# Patient Record
Sex: Male | Born: 1935
Health system: Southern US, Community
[De-identification: ages and names within clinical notes are randomized; demographics above are authoritative.]

## PROBLEM LIST (undated history)

## (undated) DIAGNOSIS — K219 Gastro-esophageal reflux disease without esophagitis: Secondary | ICD-10-CM

## (undated) DIAGNOSIS — C801 Malignant (primary) neoplasm, unspecified: Secondary | ICD-10-CM

## (undated) DIAGNOSIS — G9389 Other specified disorders of brain: Secondary | ICD-10-CM

## (undated) DIAGNOSIS — Z952 Presence of prosthetic heart valve: Secondary | ICD-10-CM

## (undated) DIAGNOSIS — I350 Nonrheumatic aortic (valve) stenosis: Secondary | ICD-10-CM

## (undated) DIAGNOSIS — G20 Parkinson's disease: Secondary | ICD-10-CM

## (undated) DIAGNOSIS — I2510 Atherosclerotic heart disease of native coronary artery without angina pectoris: Secondary | ICD-10-CM

## (undated) DIAGNOSIS — G20A1 Parkinson's disease without dyskinesia, without mention of fluctuations: Secondary | ICD-10-CM

## (undated) DIAGNOSIS — I10 Essential (primary) hypertension: Secondary | ICD-10-CM

## (undated) DIAGNOSIS — G4733 Obstructive sleep apnea (adult) (pediatric): Secondary | ICD-10-CM

## (undated) DIAGNOSIS — M48061 Spinal stenosis, lumbar region without neurogenic claudication: Secondary | ICD-10-CM

## (undated) DIAGNOSIS — K8689 Other specified diseases of pancreas: Secondary | ICD-10-CM

## (undated) DIAGNOSIS — M1990 Unspecified osteoarthritis, unspecified site: Secondary | ICD-10-CM

## (undated) DIAGNOSIS — Z87891 Personal history of nicotine dependence: Secondary | ICD-10-CM

## (undated) DIAGNOSIS — E7800 Pure hypercholesterolemia, unspecified: Secondary | ICD-10-CM

## (undated) HISTORY — PX: TONSILLECTOMY: SUR1361

## (undated) HISTORY — DX: Parkinson's disease without dyskinesia, without mention of fluctuations: G20.A1

## (undated) HISTORY — PX: APPENDECTOMY: SHX54

## (undated) HISTORY — DX: Malignant (primary) neoplasm, unspecified: C80.1

## (undated) HISTORY — DX: Parkinson's disease: G20

## (undated) HISTORY — DX: Pure hypercholesterolemia, unspecified: E78.00

## (undated) HISTORY — PX: EYE SURGERY: SHX253

## (undated) HISTORY — PX: MENISCUS REPAIR: SHX5179

## (undated) HISTORY — DX: Essential (primary) hypertension: I10

## (undated) HISTORY — DX: Personal history of nicotine dependence: Z87.891

## (undated) HISTORY — DX: Spinal stenosis, lumbar region without neurogenic claudication: M48.061

---

## 2005-11-27 ENCOUNTER — Ambulatory Visit: Payer: Self-pay | Admitting: Internal Medicine

## 2005-12-19 ENCOUNTER — Encounter: Payer: Self-pay | Admitting: Internal Medicine

## 2005-12-19 ENCOUNTER — Encounter (HOSPITAL_COMMUNITY): Admission: RE | Admit: 2005-12-19 | Discharge: 2006-01-15 | Payer: Self-pay | Admitting: Internal Medicine

## 2005-12-19 ENCOUNTER — Ambulatory Visit: Payer: Self-pay | Admitting: Cardiology

## 2008-07-03 DIAGNOSIS — E785 Hyperlipidemia, unspecified: Secondary | ICD-10-CM

## 2008-07-03 DIAGNOSIS — I10 Essential (primary) hypertension: Secondary | ICD-10-CM

## 2008-11-03 ENCOUNTER — Encounter: Payer: Self-pay | Admitting: Internal Medicine

## 2008-11-17 ENCOUNTER — Encounter: Payer: Self-pay | Admitting: Internal Medicine

## 2008-11-17 ENCOUNTER — Ambulatory Visit: Payer: Self-pay | Admitting: Internal Medicine

## 2008-11-17 ENCOUNTER — Ambulatory Visit (HOSPITAL_COMMUNITY): Admission: RE | Admit: 2008-11-17 | Discharge: 2008-11-17 | Payer: Self-pay | Admitting: Internal Medicine

## 2008-11-17 HISTORY — PX: COLONOSCOPY: SHX174

## 2008-11-19 ENCOUNTER — Inpatient Hospital Stay (HOSPITAL_COMMUNITY): Admission: AD | Admit: 2008-11-19 | Discharge: 2008-11-20 | Payer: Self-pay

## 2008-11-19 ENCOUNTER — Telehealth (INDEPENDENT_AMBULATORY_CARE_PROVIDER_SITE_OTHER): Payer: Self-pay

## 2008-11-19 ENCOUNTER — Ambulatory Visit: Payer: Self-pay | Admitting: Internal Medicine

## 2008-11-19 ENCOUNTER — Telehealth: Payer: Self-pay | Admitting: Internal Medicine

## 2008-11-19 ENCOUNTER — Ambulatory Visit: Payer: Self-pay | Admitting: Gastroenterology

## 2008-11-19 ENCOUNTER — Encounter: Payer: Self-pay | Admitting: Urgent Care

## 2008-11-19 DIAGNOSIS — K219 Gastro-esophageal reflux disease without esophagitis: Secondary | ICD-10-CM | POA: Insufficient documentation

## 2008-11-19 DIAGNOSIS — K922 Gastrointestinal hemorrhage, unspecified: Secondary | ICD-10-CM | POA: Insufficient documentation

## 2008-11-19 DIAGNOSIS — K921 Melena: Secondary | ICD-10-CM | POA: Insufficient documentation

## 2008-11-19 DIAGNOSIS — D126 Benign neoplasm of colon, unspecified: Secondary | ICD-10-CM | POA: Insufficient documentation

## 2008-11-19 HISTORY — PX: COLONOSCOPY: SHX174

## 2008-11-19 LAB — CONVERTED CEMR LAB
Basophils Absolute: 0 10*3/uL (ref 0.0–0.1)
Lymphocytes Relative: 22 % (ref 12–46)
MCHC: 35.8 g/dL (ref 30.0–36.0)
Neutro Abs: 7.2 10*3/uL (ref 1.7–7.7)
Neutrophils Relative %: 70 % (ref 43–77)
Platelets: 199 10*3/uL (ref 150–400)

## 2008-11-20 ENCOUNTER — Ambulatory Visit: Payer: Self-pay | Admitting: Internal Medicine

## 2008-11-25 ENCOUNTER — Ambulatory Visit: Payer: Self-pay | Admitting: Oncology

## 2008-11-25 ENCOUNTER — Observation Stay (HOSPITAL_COMMUNITY): Admission: RE | Admit: 2008-11-25 | Discharge: 2008-11-26 | Payer: Self-pay | Admitting: Internal Medicine

## 2008-11-25 ENCOUNTER — Ambulatory Visit: Payer: Self-pay | Admitting: Internal Medicine

## 2008-11-25 HISTORY — PX: COLONOSCOPY: SHX174

## 2008-12-04 ENCOUNTER — Ambulatory Visit (HOSPITAL_COMMUNITY): Admission: RE | Admit: 2008-12-04 | Discharge: 2008-12-04 | Payer: Self-pay | Admitting: Internal Medicine

## 2008-12-07 ENCOUNTER — Encounter: Payer: Self-pay | Admitting: Internal Medicine

## 2009-11-12 ENCOUNTER — Telehealth (INDEPENDENT_AMBULATORY_CARE_PROVIDER_SITE_OTHER): Payer: Self-pay | Admitting: *Deleted

## 2009-11-24 ENCOUNTER — Encounter: Payer: Self-pay | Admitting: Internal Medicine

## 2009-12-06 ENCOUNTER — Ambulatory Visit: Payer: Self-pay | Admitting: Internal Medicine

## 2009-12-06 ENCOUNTER — Ambulatory Visit (HOSPITAL_COMMUNITY): Admission: RE | Admit: 2009-12-06 | Discharge: 2009-12-06 | Payer: Self-pay | Admitting: Internal Medicine

## 2009-12-06 HISTORY — PX: COLONOSCOPY: SHX174

## 2009-12-08 ENCOUNTER — Encounter: Payer: Self-pay | Admitting: Internal Medicine

## 2010-02-15 NOTE — Letter (Addendum)
Summary: Patient Notice, Colon Biopsy Results  [facility]  [address]   [place], [place] [address]   Phone: [phone]  Fax: [phone]       December 08, 2009   DANA DORNER [address] [place], [place]  [address] 11-Nov-1931    Dear Mr. Orgeron,  I am pleased to inform you that the biopsies taken during your recent colonoscopy did not show any evidence of cancer upon pathologic examination.  Additional information/recommendations:  You should have a repeat colonoscopy examination  in 5 years if your overall health permits.      Please call us if you are having persistent problems or have questions about your condition that have not been fully answered at this time.  Sincerely,    R. Roetta Sessions MD, FACP [facility] Ph: [phone]    Fax: [phone]   Appended Document: Patient Notice, Colon Biopsy Results letter mailed to pt  Appended Document: Patient Notice, Colon Biopsy Results Letter mailed to pt. Copy of letter and path to PCP Dr. Dwana Melena. Copy of path report to pt's son who is GI doctor in [place] mailed per Dr. Jena Gauss request.  Appended Document: Patient Notice, Colon Biopsy Results reminder in computer

## 2010-02-15 NOTE — Progress Notes (Signed)
Summary: triage due for Nov 2011  Phone Note Call from Patient   Reason for Call: Talk to Nurse Summary of Call: pt's wife called to see when her husband's next tcs is due and that will be for Nov 2011. please call pt back at [phone] for triage Initial call taken by: Diana Eves,  November 12, 2009 10:57 AM     Appended Document: triage due for Nov 2011 Pt triaged and scheduled for 12/06/2009 @ 8:15 AM. Info faxed to [employer].

## 2010-02-15 NOTE — Letter (Signed)
Summary: TCS ORDER/TRIAGE  TCS ORDER/TRIAGE   Imported By: Rexene Alberts 11/24/2009 12:01:06  _____________________________________________________________________  External Attachment:    Type:   Image     Comment:   External Document

## 2010-04-20 LAB — CBC
HCT: 25.3 % — ABNORMAL LOW (ref 39.0–52.0)
HCT: 33.7 % — ABNORMAL LOW (ref 39.0–52.0)
HCT: 34.6 % — ABNORMAL LOW (ref 39.0–52.0)
Hemoglobin: 12 g/dL — ABNORMAL LOW (ref 13.0–17.0)
Hemoglobin: 12.4 g/dL — ABNORMAL LOW (ref 13.0–17.0)
Hemoglobin: 12.9 g/dL — ABNORMAL LOW (ref 13.0–17.0)
MCHC: 35.4 g/dL (ref 30.0–36.0)
MCHC: 35.8 g/dL (ref 30.0–36.0)
MCV: 96.6 fL (ref 78.0–100.0)
MCV: 97.2 fL (ref 78.0–100.0)
Platelets: 146 10*3/uL — ABNORMAL LOW (ref 150–400)
Platelets: 173 10*3/uL (ref 150–400)
Platelets: 232 10*3/uL (ref 150–400)
RBC: 3 MIL/uL — ABNORMAL LOW (ref 4.22–5.81)
RBC: 3.57 MIL/uL — ABNORMAL LOW (ref 4.22–5.81)
RBC: 3.72 MIL/uL — ABNORMAL LOW (ref 4.22–5.81)
RDW: 13.5 % (ref 11.5–15.5)
RDW: 14.2 % (ref 11.5–15.5)
WBC: 13.4 10*3/uL — ABNORMAL HIGH (ref 4.0–10.5)
WBC: 8 10*3/uL (ref 4.0–10.5)

## 2010-04-20 LAB — BASIC METABOLIC PANEL
BUN: 12 mg/dL (ref 6–23)
Calcium: 8.5 mg/dL (ref 8.4–10.5)
Creatinine, Ser: 1.13 mg/dL (ref 0.4–1.5)
GFR calc non Af Amer: >60 mL/min (ref >60)
Glucose, Bld: 108 mg/dL — ABNORMAL HIGH (ref 70–99)

## 2010-04-20 LAB — CROSSMATCH
Antibody Screen: NEGATIVE
Antibody Screen: NEGATIVE

## 2010-04-20 LAB — PROTIME-INR
INR: 1.04 (ref 0.00–1.49)
INR: 1.06 (ref 0.00–1.49)
Prothrombin Time: 13.5 seconds (ref 11.6–15.2)
Prothrombin Time: 13.7 seconds (ref 11.6–15.2)

## 2010-04-20 LAB — DIFFERENTIAL
Basophils Absolute: 0 10*3/uL (ref 0.0–0.1)
Basophils Absolute: 0 10*3/uL (ref 0.0–0.1)
Basophils Absolute: 0 10*3/uL (ref 0.0–0.1)
Basophils Absolute: 0 10*3/uL (ref 0.0–0.1)
Basophils Absolute: 0 10*3/uL (ref 0.0–0.1)
Basophils Relative: 0 % (ref 0–1)
Basophils Relative: 0 % (ref 0–1)
Eosinophils Relative: 1 % (ref 0–5)
Eosinophils Relative: 1 % (ref 0–5)
Eosinophils Relative: 1 % (ref 0–5)
Eosinophils Relative: 2 % (ref 0–5)
Eosinophils Relative: 2 % (ref 0–5)
Eosinophils Relative: 2 % (ref 0–5)
Lymphocytes Relative: 21 % (ref 12–46)
Lymphocytes Relative: 24 % (ref 12–46)
Lymphocytes Relative: 26 % (ref 12–46)
Lymphs Abs: 1.7 10*3/uL (ref 0.7–4.0)
Lymphs Abs: 1.8 10*3/uL (ref 0.7–4.0)
Monocytes Absolute: 0.4 10*3/uL (ref 0.1–1.0)
Monocytes Absolute: 0.6 10*3/uL (ref 0.1–1.0)
Monocytes Relative: 6 % (ref 3–12)
Monocytes Relative: 7 % (ref 3–12)
Neutro Abs: 5.3 10*3/uL (ref 1.7–7.7)
Neutro Abs: 5.6 10*3/uL (ref 1.7–7.7)
Neutrophils Relative %: 63 % (ref 43–77)
Neutrophils Relative %: 65 % (ref 43–77)
Neutrophils Relative %: 70 % (ref 43–77)
Neutrophils Relative %: 70 % (ref 43–77)

## 2010-04-20 LAB — HEMOGLOBIN AND HEMATOCRIT, BLOOD
HCT: 29.2 % — ABNORMAL LOW (ref 39.0–52.0)
Hemoglobin: 10.4 g/dL — ABNORMAL LOW (ref 13.0–17.0)
Hemoglobin: 9.5 g/dL — ABNORMAL LOW (ref 13.0–17.0)

## 2010-04-20 LAB — BLEEDING TIME: Bleeding Time: 8.5 minutes (ref 2.5–9.5)

## 2010-04-20 LAB — COMPREHENSIVE METABOLIC PANEL
AST: 22 U/L (ref 0–37)
BUN: 28 mg/dL — ABNORMAL HIGH (ref 6–23)
CO2: 29 mEq/L (ref 19–32)
Chloride: 101 mEq/L (ref 96–112)
Creatinine, Ser: 1.52 mg/dL — ABNORMAL HIGH (ref 0.4–1.5)
GFR calc non Af Amer: 45 mL/min — ABNORMAL LOW (ref >60)
Potassium: 4.1 mEq/L (ref 3.5–5.1)
Total Bilirubin: 0.7 mg/dL (ref 0.3–1.2)

## 2010-04-20 LAB — APTT: aPTT: 33 seconds (ref 24–37)

## 2010-04-20 LAB — ABO/RH: ABO/RH(D): O POS

## 2010-06-03 NOTE — Assessment & Plan Note (Signed)
[facility]                            CARDIOLOGY OFFICE NOTE   [name], [name]                      MRN:          [id number]  DATE:11/27/2005                            DOB:          11-Oct-1931    IDENTIFICATION:  [name] is a 77 year old gentleman who is referred  by Dr. Catalina Pizza for possible stress test.   HISTORY OF PRESENT ILLNESS:  The patient has no known history of  coronary artery disease.  He walks a mile in about 15 minutes.  This has  been constant.  He also works on a Engineer, agricultural farm.   His brother was recently seen in [place], [place] for cataract surgery.  Preoperative evaluation showed a positive stress test.  He went on to  have PTCA stent to a vessel, not clear what.  Another brother who had  died, question if he had cardiac issues.   Currently, again, the patient denies chest pain.   ALLERGIES:  None.   MEDICATIONS:  1. Simvastatin 40 q. day.  2. Nexium 40 q. day.  3. Micardis hydrochlorothiazide 80/25 q. day.  4. DynaCirc 10 q. day.  5. Glucosamine q. day.  6. Multivitamin q. day.  7. Calcium q. day.  8. Vitamin C q. day.   PAST MEDICAL HISTORY:  1. Hypertension.  2. Dyslipidemia.   SOCIAL HISTORY:  The patient quit tobacco 20 years ago after about a 30  pack-year.  Does not drink.   FAMILY HISTORY:  As noted above.   REVIEW OF SYSTEMS:  All systems reviewed and negative, except for the  above problems.  Note, the patient recently moved here from [place],  [place].  Was followed by Dr. Jonelle Sports.   PHYSICAL EXAM:  The patient is in no distress.  Blood pressure 140/74, pulse is 68 and regular, weight 200 pounds.  NECK:  No bruits.  LUNGS:  Clear.  CARDIAC:  Regular rate and rhythm.  S1, S2.  No S3.  No murmurs.  ABDOMEN:  Benign.  EXTREMITIES:  No edema.  Good distal pulses.   A 12-lead EKG, normal sinus rhythm 68 beats per minute.  Occasional PVC.   IMPRESSION:  [name] is a 77 year old with a  significant family  history for coronary artery disease.  His brother was asymptomatic.  He  is active some, though he walks at a relatively slower pace with a 15  minute mile.   He has risk factors for CAD.  I think it is not unreasonable to go ahead  and do a stress test to rule out any significant ischemia with his  symptoms.  I would only, if he has a mild defect, I would recommend only  medical therapy.  He had been told by his relative not to take aspirin  (a gastroenterologist) because of a history of ulcer disease.  Again,  enteric-coated may be needed depending on the results of the Myoview and  there may be benefit, but I will not push this for now.   Would continue on the current regimen.  Again, he is followed by Dr.  Margo Aye.  He  had lipids checked in [place], but will be due to have these  in the next few months.  I will leave this up to his primary.   I have not set a definite followup, but will be in touch with the  patient regarding test results.     Pricilla Riffle, MD, [facility]  Electronically Signed    PVR/MedQ  DD: 11/27/2005  DT: 11/27/2005  Job #: [id number]   cc:   Catalina Pizza, M.D.

## 2011-02-15 DIAGNOSIS — E785 Hyperlipidemia, unspecified: Secondary | ICD-10-CM | POA: Diagnosis not present

## 2011-02-15 DIAGNOSIS — I10 Essential (primary) hypertension: Secondary | ICD-10-CM | POA: Diagnosis not present

## 2011-03-20 ENCOUNTER — Other Ambulatory Visit (HOSPITAL_COMMUNITY): Payer: Self-pay | Admitting: Internal Medicine

## 2011-03-20 ENCOUNTER — Ambulatory Visit (HOSPITAL_COMMUNITY)
Admission: RE | Admit: 2011-03-20 | Discharge: 2011-03-20 | Disposition: A | Discharge status: Home / Self Care | Payer: Medicare Other | Source: Ambulatory Visit | Attending: Internal Medicine | Admitting: Internal Medicine

## 2011-03-20 DIAGNOSIS — R0989 Other specified symptoms and signs involving the circulatory and respiratory systems: Secondary | ICD-10-CM | POA: Insufficient documentation

## 2011-03-20 DIAGNOSIS — R0609 Other forms of dyspnea: Secondary | ICD-10-CM | POA: Insufficient documentation

## 2011-03-20 DIAGNOSIS — R0600 Dyspnea, unspecified: Secondary | ICD-10-CM

## 2011-03-20 DIAGNOSIS — R0602 Shortness of breath: Secondary | ICD-10-CM

## 2011-03-20 DIAGNOSIS — I10 Essential (primary) hypertension: Secondary | ICD-10-CM | POA: Diagnosis not present

## 2011-03-20 DIAGNOSIS — Z87891 Personal history of nicotine dependence: Secondary | ICD-10-CM | POA: Insufficient documentation

## 2011-03-20 DIAGNOSIS — R918 Other nonspecific abnormal finding of lung field: Secondary | ICD-10-CM | POA: Insufficient documentation

## 2011-03-22 ENCOUNTER — Other Ambulatory Visit (HOSPITAL_COMMUNITY): Payer: Self-pay | Admitting: Internal Medicine

## 2011-03-22 DIAGNOSIS — R9389 Abnormal findings on diagnostic imaging of other specified body structures: Secondary | ICD-10-CM

## 2011-03-27 ENCOUNTER — Ambulatory Visit (HOSPITAL_COMMUNITY)
Admission: RE | Admit: 2011-03-27 | Discharge: 2011-03-27 | Disposition: A | Discharge status: Home / Self Care | Payer: Medicare Other | Source: Ambulatory Visit | Attending: Internal Medicine | Admitting: Internal Medicine

## 2011-03-27 DIAGNOSIS — J438 Other emphysema: Secondary | ICD-10-CM | POA: Diagnosis not present

## 2011-03-27 DIAGNOSIS — R918 Other nonspecific abnormal finding of lung field: Secondary | ICD-10-CM | POA: Insufficient documentation

## 2011-03-27 DIAGNOSIS — R0602 Shortness of breath: Secondary | ICD-10-CM | POA: Diagnosis not present

## 2011-03-27 DIAGNOSIS — F17200 Nicotine dependence, unspecified, uncomplicated: Secondary | ICD-10-CM | POA: Diagnosis not present

## 2011-03-27 DIAGNOSIS — R9389 Abnormal findings on diagnostic imaging of other specified body structures: Secondary | ICD-10-CM

## 2011-03-27 DIAGNOSIS — J984 Other disorders of lung: Secondary | ICD-10-CM | POA: Diagnosis not present

## 2011-03-29 DIAGNOSIS — H40059 Ocular hypertension, unspecified eye: Secondary | ICD-10-CM | POA: Diagnosis not present

## 2011-03-29 DIAGNOSIS — H278 Other specified disorders of lens: Secondary | ICD-10-CM | POA: Diagnosis not present

## 2011-03-29 DIAGNOSIS — H2510 Age-related nuclear cataract, unspecified eye: Secondary | ICD-10-CM | POA: Diagnosis not present

## 2011-05-29 DIAGNOSIS — L821 Other seborrheic keratosis: Secondary | ICD-10-CM | POA: Diagnosis not present

## 2011-05-29 DIAGNOSIS — D239 Other benign neoplasm of skin, unspecified: Secondary | ICD-10-CM | POA: Diagnosis not present

## 2011-05-29 DIAGNOSIS — L570 Actinic keratosis: Secondary | ICD-10-CM | POA: Diagnosis not present

## 2011-05-29 DIAGNOSIS — Z85828 Personal history of other malignant neoplasm of skin: Secondary | ICD-10-CM | POA: Diagnosis not present

## 2011-06-16 DIAGNOSIS — R7301 Impaired fasting glucose: Secondary | ICD-10-CM | POA: Diagnosis not present

## 2011-06-16 DIAGNOSIS — I10 Essential (primary) hypertension: Secondary | ICD-10-CM | POA: Diagnosis not present

## 2011-06-16 DIAGNOSIS — G20 Parkinson's disease: Secondary | ICD-10-CM | POA: Diagnosis not present

## 2011-06-16 DIAGNOSIS — E785 Hyperlipidemia, unspecified: Secondary | ICD-10-CM | POA: Diagnosis not present

## 2011-06-19 DIAGNOSIS — G20 Parkinson's disease: Secondary | ICD-10-CM | POA: Diagnosis not present

## 2011-06-19 DIAGNOSIS — G609 Hereditary and idiopathic neuropathy, unspecified: Secondary | ICD-10-CM | POA: Diagnosis not present

## 2011-10-19 DIAGNOSIS — G20 Parkinson's disease: Secondary | ICD-10-CM | POA: Diagnosis not present

## 2011-11-30 DIAGNOSIS — H2510 Age-related nuclear cataract, unspecified eye: Secondary | ICD-10-CM | POA: Diagnosis not present

## 2011-11-30 DIAGNOSIS — H278 Other specified disorders of lens: Secondary | ICD-10-CM | POA: Diagnosis not present

## 2011-12-05 ENCOUNTER — Other Ambulatory Visit: Payer: Self-pay | Admitting: Dermatology

## 2011-12-05 DIAGNOSIS — IMO0002 Reserved for concepts with insufficient information to code with codable children: Secondary | ICD-10-CM | POA: Diagnosis not present

## 2011-12-05 DIAGNOSIS — D239 Other benign neoplasm of skin, unspecified: Secondary | ICD-10-CM | POA: Diagnosis not present

## 2011-12-05 DIAGNOSIS — I8390 Asymptomatic varicose veins of unspecified lower extremity: Secondary | ICD-10-CM | POA: Diagnosis not present

## 2011-12-05 DIAGNOSIS — C44319 Basal cell carcinoma of skin of other parts of face: Secondary | ICD-10-CM | POA: Diagnosis not present

## 2011-12-05 DIAGNOSIS — L570 Actinic keratosis: Secondary | ICD-10-CM | POA: Diagnosis not present

## 2011-12-05 DIAGNOSIS — D485 Neoplasm of uncertain behavior of skin: Secondary | ICD-10-CM | POA: Diagnosis not present

## 2011-12-05 DIAGNOSIS — L219 Seborrheic dermatitis, unspecified: Secondary | ICD-10-CM | POA: Diagnosis not present

## 2011-12-05 DIAGNOSIS — L821 Other seborrheic keratosis: Secondary | ICD-10-CM | POA: Diagnosis not present

## 2011-12-12 DIAGNOSIS — I10 Essential (primary) hypertension: Secondary | ICD-10-CM | POA: Diagnosis not present

## 2011-12-12 DIAGNOSIS — H40059 Ocular hypertension, unspecified eye: Secondary | ICD-10-CM | POA: Diagnosis not present

## 2011-12-12 DIAGNOSIS — Z9849 Cataract extraction status, unspecified eye: Secondary | ICD-10-CM | POA: Diagnosis not present

## 2011-12-12 DIAGNOSIS — H2510 Age-related nuclear cataract, unspecified eye: Secondary | ICD-10-CM | POA: Diagnosis not present

## 2011-12-12 DIAGNOSIS — H278 Other specified disorders of lens: Secondary | ICD-10-CM | POA: Diagnosis not present

## 2011-12-12 DIAGNOSIS — H269 Unspecified cataract: Secondary | ICD-10-CM | POA: Diagnosis not present

## 2011-12-12 DIAGNOSIS — G20 Parkinson's disease: Secondary | ICD-10-CM | POA: Diagnosis not present

## 2011-12-12 DIAGNOSIS — K219 Gastro-esophageal reflux disease without esophagitis: Secondary | ICD-10-CM | POA: Diagnosis not present

## 2011-12-12 DIAGNOSIS — H2589 Other age-related cataract: Secondary | ICD-10-CM | POA: Diagnosis not present

## 2011-12-12 DIAGNOSIS — E7800 Pure hypercholesterolemia, unspecified: Secondary | ICD-10-CM | POA: Diagnosis not present

## 2011-12-12 DIAGNOSIS — Z961 Presence of intraocular lens: Secondary | ICD-10-CM | POA: Diagnosis not present

## 2011-12-20 DIAGNOSIS — I10 Essential (primary) hypertension: Secondary | ICD-10-CM | POA: Diagnosis not present

## 2011-12-20 DIAGNOSIS — R7301 Impaired fasting glucose: Secondary | ICD-10-CM | POA: Diagnosis not present

## 2011-12-20 DIAGNOSIS — E785 Hyperlipidemia, unspecified: Secondary | ICD-10-CM | POA: Diagnosis not present

## 2011-12-20 DIAGNOSIS — R7309 Other abnormal glucose: Secondary | ICD-10-CM | POA: Diagnosis not present

## 2011-12-20 DIAGNOSIS — G20 Parkinson's disease: Secondary | ICD-10-CM | POA: Diagnosis not present

## 2011-12-21 DIAGNOSIS — Z23 Encounter for immunization: Secondary | ICD-10-CM | POA: Diagnosis not present

## 2012-01-02 DIAGNOSIS — C44319 Basal cell carcinoma of skin of other parts of face: Secondary | ICD-10-CM | POA: Diagnosis not present

## 2012-01-18 DIAGNOSIS — G20 Parkinson's disease: Secondary | ICD-10-CM | POA: Diagnosis not present

## 2012-02-09 DIAGNOSIS — G4731 Primary central sleep apnea: Secondary | ICD-10-CM | POA: Diagnosis not present

## 2012-02-09 DIAGNOSIS — G4730 Sleep apnea, unspecified: Secondary | ICD-10-CM | POA: Diagnosis not present

## 2012-02-09 DIAGNOSIS — G4720 Circadian rhythm sleep disorder, unspecified type: Secondary | ICD-10-CM | POA: Diagnosis not present

## 2012-02-09 DIAGNOSIS — G479 Sleep disorder, unspecified: Secondary | ICD-10-CM | POA: Diagnosis not present

## 2012-02-09 DIAGNOSIS — G4733 Obstructive sleep apnea (adult) (pediatric): Secondary | ICD-10-CM | POA: Diagnosis not present

## 2012-02-09 DIAGNOSIS — G4710 Hypersomnia, unspecified: Secondary | ICD-10-CM | POA: Diagnosis not present

## 2012-02-12 DIAGNOSIS — J069 Acute upper respiratory infection, unspecified: Secondary | ICD-10-CM | POA: Diagnosis not present

## 2012-02-19 DIAGNOSIS — G4731 Primary central sleep apnea: Secondary | ICD-10-CM | POA: Diagnosis not present

## 2012-02-19 DIAGNOSIS — G4733 Obstructive sleep apnea (adult) (pediatric): Secondary | ICD-10-CM | POA: Diagnosis not present

## 2012-02-19 DIAGNOSIS — G20 Parkinson's disease: Secondary | ICD-10-CM | POA: Diagnosis not present

## 2012-02-19 DIAGNOSIS — R0902 Hypoxemia: Secondary | ICD-10-CM | POA: Diagnosis not present

## 2012-04-16 ENCOUNTER — Encounter: Payer: Self-pay | Admitting: Neurology

## 2012-04-16 DIAGNOSIS — G4731 Primary central sleep apnea: Secondary | ICD-10-CM

## 2012-04-16 DIAGNOSIS — G20 Parkinson's disease: Secondary | ICD-10-CM

## 2012-04-16 DIAGNOSIS — I951 Orthostatic hypotension: Secondary | ICD-10-CM

## 2012-04-16 DIAGNOSIS — G609 Hereditary and idiopathic neuropathy, unspecified: Secondary | ICD-10-CM

## 2012-04-16 DIAGNOSIS — G20A1 Parkinson's disease without dyskinesia, without mention of fluctuations: Secondary | ICD-10-CM | POA: Insufficient documentation

## 2012-04-16 DIAGNOSIS — G4733 Obstructive sleep apnea (adult) (pediatric): Secondary | ICD-10-CM | POA: Insufficient documentation

## 2012-04-16 DIAGNOSIS — G479 Sleep disorder, unspecified: Secondary | ICD-10-CM

## 2012-04-16 DIAGNOSIS — R0902 Hypoxemia: Secondary | ICD-10-CM | POA: Insufficient documentation

## 2012-04-17 ENCOUNTER — Encounter: Payer: Self-pay | Admitting: Neurology

## 2012-04-17 ENCOUNTER — Ambulatory Visit (INDEPENDENT_AMBULATORY_CARE_PROVIDER_SITE_OTHER): Payer: Medicare Other | Admitting: Neurology

## 2012-04-17 VITALS — BP 106/58 | HR 100 | Ht 69.0 in | Wt 200.0 lb

## 2012-04-17 DIAGNOSIS — G4733 Obstructive sleep apnea (adult) (pediatric): Secondary | ICD-10-CM | POA: Diagnosis not present

## 2012-04-17 DIAGNOSIS — G20 Parkinson's disease: Secondary | ICD-10-CM

## 2012-04-17 DIAGNOSIS — G4731 Primary central sleep apnea: Secondary | ICD-10-CM

## 2012-04-17 DIAGNOSIS — G479 Sleep disorder, unspecified: Secondary | ICD-10-CM | POA: Diagnosis not present

## 2012-04-17 DIAGNOSIS — G4730 Sleep apnea, unspecified: Secondary | ICD-10-CM

## 2012-04-17 DIAGNOSIS — I951 Orthostatic hypotension: Secondary | ICD-10-CM

## 2012-04-17 NOTE — Progress Notes (Signed)
Subjective:    Patient ID: [name] is a 79 y.o. male.  HPI  Interim history:  [name] is a 79 year old right-handed gentleman who presents for followup consultation. He is accompanied by his wife today. I first met him on 02/19/2012 and he is a former patient of Dr. Imagene Gurney and was diagnosed with PD some 3 years ago with Sx starting on L side. This has not been associated with memory loss, depression, hallucinations, falls, bowel or bladder incontinence, postural hypotension, and there is no Hx of exposure to pesticides, herbicides, dopamine receptor blocking drugs, or family history of tremor. He exercises by walking 1 mile every morning for about 15-20 minutes, either outside or on a treadmill, 7 days per week. He is independent in his ADLs, drives a car, cuts wood. He has daytime sleepiness with. ESS 11-13. since beginning pramipexole and is on 0.5 mg t.i.d. Workup for evaluation of his peripheral neuropathy has included serum protein electrophoresis, 24-hour urine for heavy metals, B12 level, sedimentation rate, TSH, RA latex turbid, ACE, and vitamin D, all of which have been normal. He denies compulsive behavior since beginning pramipexole. He has an unusual sleep pattern, going to bed at 6 or 7PM , getting up at 9 PM, going back to bed and awakening at 4 AM. His wife states he acts out his dreams at night.  When I first met him on 02/19/2012 I went over his sleep study results with him in detail. Essentially he had mixed sleep disorder breathing. He had a total AHI of 39.5/h based primarily on central apneas. His obstructive AHI was around 15/hour. He was noted to have significant or severe sleep fragmentation and the sleep efficiency was reduced at 57.5%. He had an increased arousal index at 70.4/hour, due primarily to respiratory events. He slept mostly in stage I and stage II sleep and had near absence of slow wave sleep at 2.4% and almost no REM sleep at 0.9%. No REM behavior disorder  was seen but then again he had almost no REM sleep. His baseline oxygen saturation was 90% with a nadir of 85% and he spent 3 hours and 31 minutes below the saturation of 90% for the night. He was noted to have significant bouts of coughing. He said during his visit that he had rescheduled one time for this test because he had a cold and he was still quite congested and had post nasal drip during the study. He had also indicated to the technologist that he would not be able to sleep on his back because of coughing. He felt improved in that regard when I met him. He had been taking Mucinex DM. I did explain to him that some of the medications that have a "D" after them are not suitable for Parkinson's patients.  At the time of his visit in February with me I suggested that we try him on treatment with positive airway pressure, such as CPAP or possibly BiPAP. Given the significant central component of his sleep disordered breathing he may actually do better with BiPAP. He indicated that he would like to hold off until his appointment in April because he was going to be out of town and he also wanted to discuss this with his wife and bring her back for discussion for his appointment in April and we had left it at that. His wife reports, that he falls asleep at home at inadvertent times when sedentary. No recent dream enactments are reported by wife. He  goes to the bathroom at night one to three times per night. No recent dream enactments are reported. He is apprehensive about coming back for another sleep study.   His Past Medical History Is Significant For: Past Medical History  Diagnosis Date  . Hypercholesteremia   . Cancer   . Parkinson's disease   . Hypertension   . Sleep disorder     His Past Surgical History Is Significant For: Past Surgical History  Procedure Laterality Date  . Tonsillectomy Bilateral     His Family History Is Significant For: Family History  Problem Relation Age of Onset   . Dementia Mother   . Cancer Brother   . Stroke Brother     His Social History Is Significant For: History   Social History  . Marital Status: Married    Spouse Name: N/A    Number of Children: N/A  . Years of Education: N/A   Social History Main Topics  . Smoking status: None  . Smokeless tobacco: None  . Alcohol Use: None  . Drug Use: None  . Sexually Active: None   Other Topics Concern  . None   Social History Narrative  . None    His Allergies Are:  No Known Allergies:   His Current Medications Are:  Outpatient Encounter Prescriptions as of 04/17/2012  Medication Sig Dispense Refill  . amLODipine (NORVASC) 10 MG tablet Take 10 mg by mouth daily.      . calcium gluconate 500 MG tablet Take 500 mg by mouth daily.      . carbidopa-levodopa (SINEMET IR) 25-100 MG per tablet Take 1 tablet by mouth 3 (three) times daily. 0.5 for last dose at night      . glucosamine-chondroitin 500-400 MG tablet Take 1 tablet by mouth once.      Marland Kitchen ketoconazole (NIZORAL) 2 % cream       . Multiple Vitamin (MULTIVITAMIN) tablet Take 1 tablet by mouth daily.      Marland Kitchen omeprazole (PRILOSEC OTC) 20 MG tablet Take 20 mg by mouth daily.      . pramipexole (MIRAPEX) 0.5 MG tablet Take 0.5 mg by mouth 3 (three) times daily.      Marland Kitchen telmisartan-hydrochlorothiazide (MICARDIS HCT) 80-25 MG per tablet Take 1 tablet by mouth daily.      . vitamin C (ASCORBIC ACID) 500 MG tablet Take 500 mg by mouth daily.       No facility-administered encounter medications on file as of 04/17/2012.  : Review of Systems  Neurological: Positive for tremors.    Objective:  Neurologic Exam  Physical Exam Physical Examination:   Filed Vitals:   04/17/12 0931  BP: 106/58  Pulse: 100    General Examination: The patient is a very pleasant 79 y.o. male in no acute distress. HEENT exam: He has a moderate degree of nuchal rigidity with intermittent lower jaw and lip tremor. He has a mild to moderately masked facies  with decreased eye blink rate. Hearing is mildly impaired. Speech is mildly hypophonic, but not dysarthric and minimal drooling is noted. Pupils are equal, round and reactive to light. On extraocular tracking he has mild saccadic breakdown especially on downward gaze. He has no carotid bruits. Chest is clear to auscultation without wheezing or rhonchi noted. Heart sounds are normal without murmurs, rubs or gallops noted. Abdomen is soft, nontender with normal bowel sounds appreciated. He has no pitting edema in the distal lower extremities bilaterally. No joint deformities are noted. Skin is warm  and dry. Neurologically: Mental status: The patient is awake, alert and oriented in all 3 spheres. His memory, attention, language and knowledge are appropriate. Cranial nerves are as described under HEENT exam. In addition airway exam reveals a moderately tight airway secondary to a narrow airway entry. Tongue protrudes centrally and palate elevates symmetrically. Motor exam: Normal bulk and strength is noted. Tone is increased with some cogwheeling noted in the left upper extremity. Tone is also increased in the left lower extremity. Tone is minimally increased on the right side. He has a moderate intermittent resting tremor in the left upper extremity. He has an intermittent subtle resting tremor on the right  upper extremity. Fine motor skills with finger taps and hand movements as well as rapid alternating patting is moderately impaired on the left and mild to moderately so on the right. Foot taps are mild to moderately impaired on the right and moderately so on the left. He stands up from the seated position without problems in his posture is mild to moderately stooped. He walks with decreased arm swing bilaterally. He has a fairly good stride length but decrease pace. He turns in 3 steps. His balance is fairly well preserved. Romberg is negative. Reflexes are 1+ throughout with the absence of other the right  knee jerk. Sensory exam is intact to light touch, pinprick, vibration and temperature sense. Cerebellar testing shows no dysmetria or intention tremor on finger to nose testing. He has no truncal or gait ataxia. He is able to pull himself up on his toes and heels.   Assessment and Plan:   Assessment and Plan:  In summary, [name] is a very pleasant 79 y.o.-year old male with a history of  left-sided predominant Parkinson's disease. He is doing  fairly well at this time.  He also has evidence of mixed sleep apnea with a significant central component. He had severe sleep fragmentation and poor sleep quality.  I had a long chat with the patient and his wife about my findings and the diagnosis, its prognosis and treatment options. We talked about medical treatments and non-pharmacological approaches. We talked about maintaining a healthy lifestyle in general. I encouraged the patient to eat healthy, exercise daily and keep well hydrated, to keep a scheduled bedtime and wake time routine, to not skip any meals and eat healthy snacks in between meals and to have protein with every meal.   I recommended the following at this time:  I would like for him to come back for a full night CPAP titration study. He may benefit from BiPAP treatment given his central component. I talked to him and his wife at length about why we need to do a proper in-house titration. He is quite reluctant and apprehensive about coming back for another test. I tried to reassure him and answered all her questions today and the patient and his wife were agreeable to having him come back for another sleep study for titration. I will see him back afterwards. I made no changes to his Parkinson's medications today and he did not need any refills. I encouraged them to call with any interim questions, concerns, problems or updates and refill requests.

## 2012-04-17 NOTE — Patient Instructions (Addendum)
I think overall you are doing fairly well and are stable at this point with regards to your parkinson's. We should try to treat your sleep apnea with a CPAP or BiPAP machine. You will need another sleep study for this and I will arrange for one.   I do have some generic suggestions for you today:  Please make sure that you drink plenty of fluids. I would like for you to exercise daily for example in the form of walking 20-30 minutes every day, if you can. Please keep a regular sleep-wake schedule, keep regular meal times, do not skip any meals, eat  healthy snacks in between meals, such as fruit or nuts. Try to eat protein with every meal.   Engage in social activities in your community and with your family and try to keep up with current events by reading the newspaper or watching the news.  I do not think we need to make any changes in your medications at this point. I will see you about a month after your sleep study. Please call us if you have any interim questions, concerns, or problems or updates to need to discuss.  Please also call us for any test results so we can go over those with you on the phone. Brett Canales is my clinical assistant and will answer any of your questions and relay your messages to me and will give you my messages.   Our phone number is [phone]. We also have an after hours call service for urgent matters and there is a physician on-call for urgent questions. For any emergencies you know to call 911 or go to the nearest emergency room.

## 2012-05-14 ENCOUNTER — Ambulatory Visit (INDEPENDENT_AMBULATORY_CARE_PROVIDER_SITE_OTHER): Payer: Medicare Other

## 2012-05-14 DIAGNOSIS — I10 Essential (primary) hypertension: Secondary | ICD-10-CM

## 2012-05-14 DIAGNOSIS — G4733 Obstructive sleep apnea (adult) (pediatric): Secondary | ICD-10-CM

## 2012-05-14 DIAGNOSIS — G479 Sleep disorder, unspecified: Secondary | ICD-10-CM

## 2012-05-14 DIAGNOSIS — G4731 Primary central sleep apnea: Secondary | ICD-10-CM

## 2012-05-24 ENCOUNTER — Other Ambulatory Visit: Payer: Self-pay | Admitting: Neurology

## 2012-05-24 DIAGNOSIS — G4733 Obstructive sleep apnea (adult) (pediatric): Secondary | ICD-10-CM

## 2012-05-24 DIAGNOSIS — G4731 Primary central sleep apnea: Secondary | ICD-10-CM

## 2012-05-31 ENCOUNTER — Telehealth: Payer: Self-pay | Admitting: Neurology

## 2012-06-04 DIAGNOSIS — D1801 Hemangioma of skin and subcutaneous tissue: Secondary | ICD-10-CM | POA: Diagnosis not present

## 2012-06-04 DIAGNOSIS — Z85828 Personal history of other malignant neoplasm of skin: Secondary | ICD-10-CM | POA: Diagnosis not present

## 2012-06-04 DIAGNOSIS — D239 Other benign neoplasm of skin, unspecified: Secondary | ICD-10-CM | POA: Diagnosis not present

## 2012-06-04 DIAGNOSIS — D219 Benign neoplasm of connective and other soft tissue, unspecified: Secondary | ICD-10-CM | POA: Diagnosis not present

## 2012-06-04 DIAGNOSIS — L821 Other seborrheic keratosis: Secondary | ICD-10-CM | POA: Diagnosis not present

## 2012-06-04 DIAGNOSIS — L819 Disorder of pigmentation, unspecified: Secondary | ICD-10-CM | POA: Diagnosis not present

## 2012-06-04 DIAGNOSIS — L570 Actinic keratosis: Secondary | ICD-10-CM | POA: Diagnosis not present

## 2012-06-19 DIAGNOSIS — G20 Parkinson's disease: Secondary | ICD-10-CM | POA: Diagnosis not present

## 2012-06-19 DIAGNOSIS — I10 Essential (primary) hypertension: Secondary | ICD-10-CM | POA: Diagnosis not present

## 2012-06-19 DIAGNOSIS — R7309 Other abnormal glucose: Secondary | ICD-10-CM | POA: Diagnosis not present

## 2012-06-19 DIAGNOSIS — E785 Hyperlipidemia, unspecified: Secondary | ICD-10-CM | POA: Diagnosis not present

## 2012-06-28 DIAGNOSIS — D3130 Benign neoplasm of unspecified choroid: Secondary | ICD-10-CM | POA: Diagnosis not present

## 2012-06-28 DIAGNOSIS — H278 Other specified disorders of lens: Secondary | ICD-10-CM | POA: Diagnosis not present

## 2012-07-08 ENCOUNTER — Telehealth: Payer: Self-pay | Admitting: Neurology

## 2012-07-08 NOTE — Telephone Encounter (Signed)
Patient is having trouble using the Bipap machine because he has acquired an open sore on his nose from the mask.  Please advise ASAP.  Pt can be reached at [phone]

## 2012-07-09 NOTE — Telephone Encounter (Signed)
Spk to pt, he has gotten a hold of Respiratory Therapist at [facility], she is scheduled to come visit him in his home on Friday morning to assess the mask issues he is having.  I explained that if he is developing sores, his mask is likely too tight.  Told him to contact me after Friday if he feels like he would benefit from an in lab mask fitting session.  He also asks if it is time to follow up with Dr. Frances Furbish.  He received the CPAP on 06/21/2012 and so he does need to see her for a 30 day post CPAP visit.  This was scheduled today for Friday 07/26/12.  Pt was told to bring his machine and arrive early. -sh

## 2012-07-26 ENCOUNTER — Ambulatory Visit (INDEPENDENT_AMBULATORY_CARE_PROVIDER_SITE_OTHER): Payer: Medicare Other | Admitting: Neurology

## 2012-07-26 ENCOUNTER — Encounter: Payer: Self-pay | Admitting: Neurology

## 2012-07-26 VITALS — BP 117/70 | HR 92 | Temp 96.7°F | Ht 71.0 in | Wt 203.0 lb

## 2012-07-26 DIAGNOSIS — G4733 Obstructive sleep apnea (adult) (pediatric): Secondary | ICD-10-CM

## 2012-07-26 DIAGNOSIS — G20 Parkinson's disease: Secondary | ICD-10-CM

## 2012-07-26 DIAGNOSIS — G4731 Primary central sleep apnea: Secondary | ICD-10-CM

## 2012-07-26 DIAGNOSIS — G4730 Sleep apnea, unspecified: Secondary | ICD-10-CM

## 2012-07-26 MED ORDER — PRAMIPEXOLE DIHYDROCHLORIDE 0.5 MG PO TABS: 0.5000 mg | ORAL_TABLET | Freq: Three times a day (TID) | ORAL | Status: DC

## 2012-07-26 MED ORDER — CARBIDOPA-LEVODOPA 25-100 MG PO TABS: 1.0000 | ORAL_TABLET | Freq: Three times a day (TID) | ORAL | Status: DC

## 2012-07-26 NOTE — Progress Notes (Signed)
Subjective:    Patient ID: [name] is a 79 y.o. male.  HPI  Interim history:   [name] is a very pleasant 79 year old right-handed gentleman with PD, who presents for followup consultation after a recent sleep study. I first met him on 02/19/2012 after a recent baseline sleep study. His total AHI was 39.5 per hour based primarily on central apneas. His obstructive AHI was around 15 per hour. His oxyhemoglobin desaturation nadir was 85% and he spent 3 hours and 31 minutes below the saturation of 90% for the night. I've asked him to come back for a CPAP titration study, possibly BiPAP but he wanted to hold off until his appointment in April as he was going to be out of town and he also wanted to bring his wife for discussion. I then saw him back on 04/17/2012 and again went over his test results with him and his wife he was doing well from a Parkinson's standpoint. He agreed to come back for another sleep study with full night titration. His sleep study of 05/14/2012 and I went over his test results with him and his wife in detail today. Sleep efficiency was reduced at 71.5% with a latency to sleep of 2 minutes. Wake after sleep onset was highly elevated at 124 minutes with mild to moderate sleep fragmentation noted. He had increased percentage of REM sleep at 27.2%. He had a normal REM latency. There were no significant aortic leg movements. He was started on CPAP at a pressure of 5 cm and titrated up to 9 cm of water pressure but he had significant central apneas and therefore changed to BiPAP at 11/7 and then switch to ST mode for ongoing central events. His final pressure was 13/9 with a backup rate of 10 on which she had a residual AHI of 0 per hour supine REM sleep was achieved. Oxyhemoglobin desaturation nadir on the final pressure was 90%. He has since been placed on BiPAP ST at 13/9 with a backup rate of 10 an overview compliance from 06/26/2012 through 07/25/2012 which is a total of29  days during which time he used it every day. His average usage was 5 hours and 6 minutes and percent you stays greater than 4 hours was 96% indicating very good compliance. His residual AHI is around 6 indicating fairly reasonable pressure settings. He reports tolerating the treatment and since he changed from a FFM to a nasal mask, which was about 2 weeks ago. He continues to have mostly L sided PD sx and his tremor may have become a little worse in the last 3 months, since I saw him. He denies depression, memory loss, lightheadedness, or hallucinations.   His Past Medical History Is Significant For: Past Medical History  Diagnosis Date  . Hypercholesteremia   . Cancer   . Parkinson's disease   . Hypertension   . Sleep disorder   . Ex-smoker    His Past Surgical History Is Significant For: Past Surgical History  Procedure Laterality Date  . Tonsillectomy Bilateral    His Family History Is Significant For: Family History  Problem Relation Age of Onset  . Dementia Mother   . Cancer Brother   . Stroke Brother    His Social History Is Significant For: History   Social History  . Marital Status: Married    Spouse Name: N/A    Number of Children: N/A  . Years of Education: N/A   Social History Main Topics  . Smoking status:  Not on file  . Smokeless tobacco: Not on file  . Alcohol Use: Not on file  . Drug Use: Not on file  . Sexually Active: Not on file   Other Topics Concern  . Not on file   Social History Narrative  . No narrative on file   His Allergies Are:  No Known Allergies:   His Current Medications Are:  Outpatient Encounter Prescriptions as of 07/26/2012  Medication Sig Dispense Refill  . amLODipine (NORVASC) 10 MG tablet Take 10 mg by mouth daily.      . calcium gluconate 500 MG tablet Take 500 mg by mouth daily.      . carbidopa-levodopa (SINEMET IR) 25-100 MG per tablet Take 1 tablet by mouth 3 (three) times daily. 0.5 for last dose at night      .  glucosamine-chondroitin 500-400 MG tablet Take 1 tablet by mouth once.      Marland Kitchen ketoconazole (NIZORAL) 2 % cream       . Multiple Vitamin (MULTIVITAMIN) tablet Take 1 tablet by mouth daily.      Marland Kitchen omeprazole (PRILOSEC OTC) 20 MG tablet Take 20 mg by mouth daily.      . pramipexole (MIRAPEX) 0.5 MG tablet Take 0.5 mg by mouth 3 (three) times daily.      Marland Kitchen telmisartan-hydrochlorothiazide (MICARDIS HCT) 80-25 MG per tablet Take 1 tablet by mouth daily.      . vitamin C (ASCORBIC ACID) 500 MG tablet Take 500 mg by mouth daily.       No facility-administered encounter medications on file as of 07/26/2012.  : Review of Systems  Neurological: Positive for tremors.   Objective:  Neurologic Exam  Physical Exam Physical Examination:   Filed Vitals:   07/26/12 0939  BP: 117/70  Pulse: 92  Temp: 96.7 F (35.9 C)    General Examination: The patient is a very pleasant 79 y.o. male in no acute distress.  HEENT exam: He has a moderate degree of nuchal rigidity with mild intermittent lower jaw and lip tremor. He has a mild to moderately masked facies with decreased eye blink rate. Hearing is mildly impaired. Speech is mildly hypophonic, but not dysarthric and minimal drooling is noted. Pupils are equal, round and reactive to light. On extraocular tracking he has mild saccadic breakdown especially on downward gaze. He has no carotid bruits. Chest is clear to auscultation without wheezing or rhonchi noted. Heart sounds are normal without murmurs, rubs or gallops noted. Abdomen is soft, nontender with normal bowel sounds appreciated. He has no pitting edema in the distal lower extremities bilaterally. No joint deformities are noted. Skin is warm and dry. Neurologically: Mental status: The patient is awake, alert and oriented in all 3 spheres. His memory, attention, language and knowledge are appropriate. Cranial nerves are as described under HEENT exam. In addition airway exam reveals a moderately tight  airway secondary to a narrow airway entry. Tongue protrudes centrally and palate elevates symmetrically. Motor exam: Normal bulk and strength is noted. Tone is increased with some cogwheeling noted in the left upper extremity. Tone is also increased in the left lower extremity. Tone is minimally increased on the right side. He has a moderate intermittent resting tremor in the left upper extremity. He has an intermittent subtle resting tremor on the right  upper extremity. Fine motor skills with finger taps and hand movements as well as rapid alternating patting is moderately impaired on the left and mild to moderately so on the right. Foot  taps are mild to moderately impaired on the right and moderately so on the left. He stands up from the seated position without problems in his posture is mild to moderately stooped. He walks with decreased arm swing bilaterally. He has a fairly good stride length but decreased pace. He turns in 3 steps. His balance is fairly well preserved. Romberg is negative. Reflexes are 1+ throughout with the absence of other the right knee jerk. Sensory exam is intact to light touch, pinprick, vibration and temperature sense. Cerebellar testing shows no dysmetria or intention tremor on finger to nose testing. He has no truncal or gait ataxia. He is able to pull himself up on his toes and heels.   Assessment and Plan:    In summary, [name] is a very pleasant 79 y.o.-year old male with a history of  left-sided predominant Parkinson's disease. He is doing  fairly well at this time, but I do suggest increasing Sinemet to 1 pill tid; we will keep the Mirapex the same for now. He also has evidence of mixed sleep apnea with a significant central component. He had severe sleep fragmentation and poor sleep quality and is now established on BiPAP ST with good tolerance. I reviewed his first compliance download and congratulated him for being compliant and encouraged him to continue with  treatment long term. I again reviewed his sleep test results with him and discussed the importance of treatment with him and his wife. Thankfully he has been able to tolerate the new mask better. I suggested a four-month checkup for routine visit. In the interim if he has any questions, concerns, problems or dates he is advised to call. They were in agreement.

## 2012-07-26 NOTE — Patient Instructions (Addendum)
Please continue using your biPAP regularly. While your insurance requires that you use BiPAP at least 4 hours each night on 70% of the nights, I recommend, that you not skip any nights and use it throughout the night if you can. Getting used to biPAP does take time and patience and discipline. Untreated obstructive sleep apnea when it is moderate to severe can have an adverse impact on cardiovascular health and raise her risk for heart disease, arrhythmias, hypertension, congestive heart failure, stroke and diabetes. Untreated obstructive sleep apnea causes sleep disruption, nonrestorative sleep, and sleep deprivation. This can have an impact on your day to day functioning and cause daytime sleepiness and impairment of cognitive function, memory loss, mood disturbance, and problems focussing. Using BiPAP regularly can improve these symptoms.  I think overall you are doing fairly well:  Remember to drink plenty of fluid, eat healthy meals and do not skip any meals. Try to eat protein with a every meal and eat a healthy snack such as fruit or nuts in between meals. Try to keep a regular sleep-wake schedule and try to exercise daily, particularly in the form of walking, 20-30 minutes a day, if you can.   Engage in social activities in your community and with your family and try to keep up with current events by reading the newspaper or watching the news.   As far as your medications are concerned, I would like to suggest a small increase in your levodopa to 1 full pill three times a day.  As far as diagnostic testing: no new test.  I would like to see you back in 4 months, sooner if we need to. Please call us with any interim questions, concerns, problems, updates or refill requests.  Brett Canales is my clinical assistant and will answer any of your questions and relay your messages to me and also relay most of my messages to you.  Our phone number is [phone]. We also have an after hours call service for  urgent matters and there is a physician on-call for urgent questions. For any emergencies you know to call 911 or go to the nearest emergency room.

## 2012-07-31 ENCOUNTER — Encounter: Payer: Self-pay | Admitting: Neurology

## 2012-08-09 ENCOUNTER — Encounter: Payer: Self-pay | Admitting: Neurology

## 2012-08-09 NOTE — Progress Notes (Signed)
Quick Note:  I reviewed the patient's BiPAP compliance data from 06/20/2012 to 07/19/2012, which is a total of 30 days, during which time the patient used biPAP every day. The average usage for all days was 5 hours and 5 minutes. The percent used days greater than 4 hours was 97 %, indicating excellent compliance. The residual AHI was 16.6 per hour, indicating a suboptimal treatment pressure of 13/9 cwp with a backup rate of 10. I will review this data with the patient at the next visit, provide feedback and additional trouble shooting if need be.  Huston Foley, MD, PhD [facility] ([facility])   ______

## 2012-10-07 DIAGNOSIS — Z23 Encounter for immunization: Secondary | ICD-10-CM | POA: Diagnosis not present

## 2012-11-07 ENCOUNTER — Encounter: Payer: Self-pay | Admitting: Neurology

## 2012-11-12 NOTE — Progress Notes (Signed)
Quick Note:  I reviewed the patient's PAP compliance data from 09/29/2012 to 10/28/2012, which is a total of 30 days, during which time the patient used BiPAP every day. The average usage for all days was 5 hours and 23 minutes. The percent used days greater than 4 hours was 97 %, indicating excellent compliance. The residual AHI was 2.6 per hour, indicating an appropriate treatment pressure of 13/9 cwp with a backup rate of 10 breaths per minute. I will review this data with the patient at the next office visit, provide feedback and additional troubleshooting if need be.  Huston Foley, MD, PhD [facility] ([facility]   ______

## 2012-11-13 ENCOUNTER — Encounter: Payer: Self-pay | Admitting: Neurology

## 2012-11-27 ENCOUNTER — Encounter: Payer: Self-pay | Admitting: Neurology

## 2012-11-27 ENCOUNTER — Ambulatory Visit (INDEPENDENT_AMBULATORY_CARE_PROVIDER_SITE_OTHER): Payer: Medicare Other | Admitting: Neurology

## 2012-11-27 ENCOUNTER — Encounter (INDEPENDENT_AMBULATORY_CARE_PROVIDER_SITE_OTHER): Payer: Self-pay

## 2012-11-27 VITALS — BP 112/74 | HR 91 | Temp 98.0°F | Ht 71.0 in | Wt 206.0 lb

## 2012-11-27 DIAGNOSIS — G4730 Sleep apnea, unspecified: Secondary | ICD-10-CM

## 2012-11-27 DIAGNOSIS — G20 Parkinson's disease: Secondary | ICD-10-CM

## 2012-11-27 DIAGNOSIS — G4733 Obstructive sleep apnea (adult) (pediatric): Secondary | ICD-10-CM | POA: Diagnosis not present

## 2012-11-27 DIAGNOSIS — G609 Hereditary and idiopathic neuropathy, unspecified: Secondary | ICD-10-CM | POA: Diagnosis not present

## 2012-11-27 DIAGNOSIS — G4731 Primary central sleep apnea: Secondary | ICD-10-CM

## 2012-11-27 NOTE — Patient Instructions (Addendum)
I think your Parkinson's disease has remained fairly stable, which is reassuring. Nevertheless, as you know, this disease does progress with time. It can affect your balance, your memory, your mood, your bowel and bladder function, your posture, balance and walking. Overall you are doing fairly well but I do want to suggest a few things today:  Remember to drink plenty of fluid, eat healthy meals and do not skip any meals. Try to eat protein with a every meal and eat a healthy snack such as fruit or nuts in between meals. Try to keep a regular sleep-wake schedule and try to exercise daily, particularly in the form of walking, 20-30 minutes a day, if you can.   Taking your medication on schedule is key.   Try to stay active physically and mentally. Engage in social activities in your community and with your family and try to keep up with current events by reading the newspaper or watching the news. Try to do word puzzles and you may like to do word puzzles and brain games on the computer such as on [email or web address]   As far as your medications are concerned, I would like to suggest that you take your current medication with the following additional changes: take your Sinemet and Mirapex about 1 hour before eating: 7 AM, 11 AM, 4 PM.     As far as diagnostic testing, I will order: no new test today.  I would like to see you back in 6 months, sooner if we need to. Please call us with any interim questions, concerns, problems, updates or refill requests.  Brett Canales is my clinical assistant and will answer any of your questions and relay your messages to me and also relay most of my messages to you.  Our phone number is [phone]. We also have an after hours call service for urgent matters and there is a physician on-call for urgent questions, that cannot wait till the next work day. For any emergencies you know to call 911 or go to the nearest emergency room.

## 2012-11-27 NOTE — Progress Notes (Signed)
Subjective:    Patient ID: [name] is a 79 y.o. male.  HPI  Interim history:  [name] is a very pleasant 79 year old right-handed gentleman, presents for followup consultation off his Parkinson's disease as well as complex sleep apnea, on BiPAP ST. He is accompanied by his wife again today. I last saw him on 07/26/2012 after had his sleep study. He has been compliant on BiPAP therapy. At the time of his last visit I increased his Sinemet to one pill 3 times a day. He takes it with his meals and has not noted any significant improvement in his symptoms, but, then again, he takes it right after his meals. He is asking whether he should take his BiPAP machine with him to his planned trip to [place]. I first met him on 02/19/2012 after his baseline sleep study. His total AHI was 39.5 per hour based primarily on central apneas. His obstructive AHI was around 15 per hour. His oxyhemoglobin desaturation nadir was 85% and he spent 3 hours and 31 minutes below the saturation of 90% for the night. I asked him to come back for a CPAP titration study, possibly BiPAP but he wanted to hold off until his appointment in April as he was going to be out of town and he also wanted to bring his wife for discussion. I then saw him back on 04/17/2012 and again went over his test results with him and his wife. He has been doing well from the PD standpoint. He agreed to come back for another sleep study with full night titration. His sleep titration study was on 05/14/2012 and I went over his test results with him and his wife in detail during our visit in July. Sleep efficiency was reduced at 71.5% with a latency to sleep of 2 minutes. Wake after sleep onset was highly elevated at 124 minutes with mild to moderate sleep fragmentation noted. He had increased percentage of REM sleep at 27.2%. He had a normal REM latency. There were no significant aortic leg movements. He was started on CPAP at a pressure of 5 cm and  titrated up to 9 cm of water pressure but he had significant central apneas and therefore changed to BiPAP at 11/7 and then switch to ST mode for ongoing central events. His final pressure was 13/9 with a backup rate of 10 on which she had a residual AHI of 0 per hour and supine REM sleep achieved. Oxyhemoglobin desaturation nadir on the final pressure was 90%.  He has since been placed on BiPAP ST at 13/9 cm with a backup rate of 10. I reviewed his compliance from 06/26/2012 through 07/25/2012 last time, total of 29 days during which time he used it every day. His average usage was 5 hours and 6 minutes and percent used days greater than 4 hours was 96% indicating excellent compliance. His residual AHI was around 6 indicating fairly reasonable pressure settings. He reported tolerating the treatment and he changed from a FFM to a nasal mask. He continues to have mostly L sided PD sx and his tremor may have become a little worse in the last few months. He denied depression, memory loss, lightheadedness, or hallucinations. I also reviewed more recent compliance data from 09/29/2012 through 10/28/2012 which is a total of 30 days during which time he used his machine every day. His average usage was 5 hours and 23 minutes, his percent used days greater than 4 hours was 29 days, which is 97%, indicating  excellent compliance. His residual AHI was 2.6 per hour indicating an appropriate treatment setting of 13/9 cm with a backup rate of 10 per minute.   His Past Medical History Is Significant For: Past Medical History  Diagnosis Date  . Hypercholesteremia   . Cancer   . Parkinson's disease   . Hypertension   . Sleep disorder   . Ex-smoker     His Past Surgical History Is Significant For: Past Surgical History  Procedure Laterality Date  . Tonsillectomy Bilateral     His Family History Is Significant For: Family History  Problem Relation Age of Onset  . Dementia Mother   . Cancer Brother   . Stroke  Brother     His Social History Is Significant For: History   Social History  . Marital Status: Married    Spouse Name: N/A    Number of Children: N/A  . Years of Education: N/A   Social History Main Topics  . Smoking status: Never Smoker   . Smokeless tobacco: None  . Alcohol Use: None  . Drug Use: None  . Sexual Activity: None   Other Topics Concern  . None   Social History Narrative  . None    His Allergies Are:  No Known Allergies:   His Current Medications Are:  Outpatient Encounter Prescriptions as of 11/27/2012  Medication Sig  . amLODipine (NORVASC) 10 MG tablet Take 10 mg by mouth daily.  . calcium gluconate 500 MG tablet Take 500 mg by mouth daily.  . carbidopa-levodopa (SINEMET IR) 25-100 MG per tablet Take 1 tablet by mouth 3 (three) times daily.  Marland Kitchen glucosamine-chondroitin 500-400 MG tablet Take 1 tablet by mouth once.  Marland Kitchen ketoconazole (NIZORAL) 2 % cream   . Multiple Vitamin (MULTIVITAMIN) tablet Take 1 tablet by mouth daily.  Marland Kitchen omeprazole (PRILOSEC OTC) 20 MG tablet Take 20 mg by mouth daily.  . pramipexole (MIRAPEX) 0.5 MG tablet Take 1 tablet (0.5 mg total) by mouth 3 (three) times daily.  Marland Kitchen telmisartan-hydrochlorothiazide (MICARDIS HCT) 80-25 MG per tablet Take 1 tablet by mouth daily.  . vitamin C (ASCORBIC ACID) 500 MG tablet Take 500 mg by mouth daily.  :  Review of Systems:  Out of a complete 14 point review of systems, all are reviewed and negative with the exception of these symptoms as listed below:  Review of Systems  Constitutional: Negative.   HENT: Negative.   Eyes: Negative.   Respiratory: Negative.   Cardiovascular:       Murmur  Gastrointestinal: Positive for constipation.  Endocrine: Negative.   Genitourinary: Negative.   Musculoskeletal: Negative.   Skin: Negative.   Allergic/Immunologic: Negative.   Neurological: Negative.   Hematological: Negative.   Psychiatric/Behavioral: Negative.     Objective:  Neurologic  Exam  Physical Exam Physical Examination:   Filed Vitals:   11/27/12 1123  BP: 112/74  Pulse: 91  Temp: 98 F (36.7 C)   General Examination: The patient is a very pleasant 79 y.o. male in no acute distress.  HEENT exam: He has a moderate degree of nuchal rigidity with mild intermittent lower jaw and lip tremor. He has a mild to moderately masked facies with decreased eye blink rate. Hearing is mildly impaired. Speech is mildly to moderately hypophonic, but not dysarthric and minimal drooling is noted. Pupils are equal, round and reactive to light. On extraocular tracking he has mild saccadic breakdown especially on downward gaze. He has no carotid bruits. Chest is clear to auscultation without  wheezing or rhonchi noted. Heart sounds are normal without murmurs, rubs or gallops noted. Abdomen is soft, nontender with normal bowel sounds appreciated. He has no pitting edema in the distal lower extremities bilaterally. No joint deformities are noted. Skin is warm and dry. Neurologically: Mental status: The patient is awake, alert and oriented in all 3 spheres. His memory, attention, language and knowledge are appropriate. Cranial nerves are as described under HEENT exam. In addition airway exam reveals a moderately tight airway secondary to a narrow airway entry. Tongue protrudes centrally and palate elevates symmetrically. Motor exam: Normal bulk and strength is noted. Tone is increased with some cogwheeling noted in the left upper extremity. Tone is also increased in the left lower extremity. Tone is minimally increased on the right side. He has a moderate intermittent resting tremor in the left upper extremity. He has an intermittent mild resting tremor on the right upper extremity. Fine motor skills with finger taps and hand movements as well as rapid alternating patting is moderately impaired on the left and mild to moderately so on the right. Foot taps are mild to moderately impaired on the  right and moderately so on the left. He stands up from the seated position without problems in his posture is mild to moderately stooped. He walks with decreased arm swing bilaterally. He has a fairly good stride length but decreased pace. He turns in 3 steps. His balance is fairly well preserved. Romberg is negative. Reflexes are 1+ in the UEs and absent in the LEs. Sensory exam is intact to light touch, pinprick, vibration and temperature sense in the upper extremities and decreased only to pinprick sensation in the distal lower extremities up to his mid shin area. Cerebellar testing shows no dysmetria or intention tremor on finger to nose testing. He has no truncal or gait ataxia. He is able to pull himself up on his toes and heels.   Assessment and Plan:   In summary, [name] is a very pleasant 79 year old male with a history of left-sided predominant Parkinson's disease and complex sleep apnea. He is doing  fairly well at this time, but I do suggest changing the timing of his Sinemet to one hour before he eats so to ensure better absorption. This is perhaps why he has not felt that the medication is not helpful. I will therefore ask him to take both his Sinemet and Mirapex at 7 AM, 11 AM and 4 PM. Down the Road we may increase his regimen to 4 times a day but for now I don't think there is a need to increase his medications. He is advised to take his BiPAP machine with him on his trip to [place]. They are going to stay with their daughter over the holidays. He is advised to continue using BiPAP regularly and is congratulated on his very good compliance. I reviewed his compliance data with him as well today. I suggested a 6 month checkup. He did not need any refills today. In the interim if he has any questions, concerns, problems or dates he is advised to call. They were in agreement.

## 2012-11-28 DIAGNOSIS — Z23 Encounter for immunization: Secondary | ICD-10-CM | POA: Diagnosis not present

## 2012-12-09 ENCOUNTER — Other Ambulatory Visit: Payer: Self-pay | Admitting: Dermatology

## 2012-12-19 DIAGNOSIS — R7309 Other abnormal glucose: Secondary | ICD-10-CM | POA: Diagnosis not present

## 2013-03-18 DIAGNOSIS — R944 Abnormal results of kidney function studies: Secondary | ICD-10-CM | POA: Diagnosis not present

## 2013-03-18 DIAGNOSIS — D72829 Elevated white blood cell count, unspecified: Secondary | ICD-10-CM | POA: Diagnosis not present

## 2013-03-20 ENCOUNTER — Other Ambulatory Visit: Payer: Self-pay | Admitting: Dermatology

## 2013-03-20 DIAGNOSIS — D485 Neoplasm of uncertain behavior of skin: Secondary | ICD-10-CM | POA: Diagnosis not present

## 2013-03-20 DIAGNOSIS — C44621 Squamous cell carcinoma of skin of unspecified upper limb, including shoulder: Secondary | ICD-10-CM | POA: Diagnosis not present

## 2013-03-20 DIAGNOSIS — Z85828 Personal history of other malignant neoplasm of skin: Secondary | ICD-10-CM | POA: Diagnosis not present

## 2013-03-25 DIAGNOSIS — M6281 Muscle weakness (generalized): Secondary | ICD-10-CM | POA: Diagnosis not present

## 2013-03-25 DIAGNOSIS — G20 Parkinson's disease: Secondary | ICD-10-CM | POA: Diagnosis not present

## 2013-04-07 DIAGNOSIS — M6281 Muscle weakness (generalized): Secondary | ICD-10-CM | POA: Diagnosis not present

## 2013-04-07 DIAGNOSIS — R293 Abnormal posture: Secondary | ICD-10-CM | POA: Diagnosis not present

## 2013-04-07 DIAGNOSIS — R279 Unspecified lack of coordination: Secondary | ICD-10-CM | POA: Diagnosis not present

## 2013-04-07 DIAGNOSIS — G219 Secondary parkinsonism, unspecified: Secondary | ICD-10-CM | POA: Diagnosis not present

## 2013-04-09 DIAGNOSIS — R293 Abnormal posture: Secondary | ICD-10-CM | POA: Diagnosis not present

## 2013-04-09 DIAGNOSIS — M6281 Muscle weakness (generalized): Secondary | ICD-10-CM | POA: Diagnosis not present

## 2013-04-09 DIAGNOSIS — G219 Secondary parkinsonism, unspecified: Secondary | ICD-10-CM | POA: Diagnosis not present

## 2013-04-09 DIAGNOSIS — R279 Unspecified lack of coordination: Secondary | ICD-10-CM | POA: Diagnosis not present

## 2013-04-14 DIAGNOSIS — G219 Secondary parkinsonism, unspecified: Secondary | ICD-10-CM | POA: Diagnosis not present

## 2013-04-14 DIAGNOSIS — M6281 Muscle weakness (generalized): Secondary | ICD-10-CM | POA: Diagnosis not present

## 2013-04-14 DIAGNOSIS — R293 Abnormal posture: Secondary | ICD-10-CM | POA: Diagnosis not present

## 2013-04-14 DIAGNOSIS — R279 Unspecified lack of coordination: Secondary | ICD-10-CM | POA: Diagnosis not present

## 2013-04-16 DIAGNOSIS — R279 Unspecified lack of coordination: Secondary | ICD-10-CM | POA: Diagnosis not present

## 2013-04-16 DIAGNOSIS — G219 Secondary parkinsonism, unspecified: Secondary | ICD-10-CM | POA: Diagnosis not present

## 2013-04-16 DIAGNOSIS — R293 Abnormal posture: Secondary | ICD-10-CM | POA: Diagnosis not present

## 2013-04-16 DIAGNOSIS — M6281 Muscle weakness (generalized): Secondary | ICD-10-CM | POA: Diagnosis not present

## 2013-04-17 DIAGNOSIS — R279 Unspecified lack of coordination: Secondary | ICD-10-CM | POA: Diagnosis not present

## 2013-04-17 DIAGNOSIS — R293 Abnormal posture: Secondary | ICD-10-CM | POA: Diagnosis not present

## 2013-04-17 DIAGNOSIS — G219 Secondary parkinsonism, unspecified: Secondary | ICD-10-CM | POA: Diagnosis not present

## 2013-04-17 DIAGNOSIS — M6281 Muscle weakness (generalized): Secondary | ICD-10-CM | POA: Diagnosis not present

## 2013-04-21 DIAGNOSIS — G219 Secondary parkinsonism, unspecified: Secondary | ICD-10-CM | POA: Diagnosis not present

## 2013-04-21 DIAGNOSIS — R279 Unspecified lack of coordination: Secondary | ICD-10-CM | POA: Diagnosis not present

## 2013-04-21 DIAGNOSIS — M6281 Muscle weakness (generalized): Secondary | ICD-10-CM | POA: Diagnosis not present

## 2013-04-21 DIAGNOSIS — R293 Abnormal posture: Secondary | ICD-10-CM | POA: Diagnosis not present

## 2013-04-23 DIAGNOSIS — R279 Unspecified lack of coordination: Secondary | ICD-10-CM | POA: Diagnosis not present

## 2013-04-23 DIAGNOSIS — M6281 Muscle weakness (generalized): Secondary | ICD-10-CM | POA: Diagnosis not present

## 2013-04-23 DIAGNOSIS — R293 Abnormal posture: Secondary | ICD-10-CM | POA: Diagnosis not present

## 2013-04-23 DIAGNOSIS — G219 Secondary parkinsonism, unspecified: Secondary | ICD-10-CM | POA: Diagnosis not present

## 2013-04-25 DIAGNOSIS — M6281 Muscle weakness (generalized): Secondary | ICD-10-CM | POA: Diagnosis not present

## 2013-04-25 DIAGNOSIS — R279 Unspecified lack of coordination: Secondary | ICD-10-CM | POA: Diagnosis not present

## 2013-04-25 DIAGNOSIS — G219 Secondary parkinsonism, unspecified: Secondary | ICD-10-CM | POA: Diagnosis not present

## 2013-04-25 DIAGNOSIS — R293 Abnormal posture: Secondary | ICD-10-CM | POA: Diagnosis not present

## 2013-04-28 DIAGNOSIS — G219 Secondary parkinsonism, unspecified: Secondary | ICD-10-CM | POA: Diagnosis not present

## 2013-04-28 DIAGNOSIS — M6281 Muscle weakness (generalized): Secondary | ICD-10-CM | POA: Diagnosis not present

## 2013-04-28 DIAGNOSIS — R293 Abnormal posture: Secondary | ICD-10-CM | POA: Diagnosis not present

## 2013-04-28 DIAGNOSIS — R279 Unspecified lack of coordination: Secondary | ICD-10-CM | POA: Diagnosis not present

## 2013-04-29 DIAGNOSIS — R279 Unspecified lack of coordination: Secondary | ICD-10-CM | POA: Diagnosis not present

## 2013-04-29 DIAGNOSIS — M6281 Muscle weakness (generalized): Secondary | ICD-10-CM | POA: Diagnosis not present

## 2013-04-29 DIAGNOSIS — G219 Secondary parkinsonism, unspecified: Secondary | ICD-10-CM | POA: Diagnosis not present

## 2013-04-29 DIAGNOSIS — R293 Abnormal posture: Secondary | ICD-10-CM | POA: Diagnosis not present

## 2013-05-02 DIAGNOSIS — R293 Abnormal posture: Secondary | ICD-10-CM | POA: Diagnosis not present

## 2013-05-02 DIAGNOSIS — M6281 Muscle weakness (generalized): Secondary | ICD-10-CM | POA: Diagnosis not present

## 2013-05-02 DIAGNOSIS — G219 Secondary parkinsonism, unspecified: Secondary | ICD-10-CM | POA: Diagnosis not present

## 2013-05-02 DIAGNOSIS — R279 Unspecified lack of coordination: Secondary | ICD-10-CM | POA: Diagnosis not present

## 2013-05-05 DIAGNOSIS — R279 Unspecified lack of coordination: Secondary | ICD-10-CM | POA: Diagnosis not present

## 2013-05-05 DIAGNOSIS — G219 Secondary parkinsonism, unspecified: Secondary | ICD-10-CM | POA: Diagnosis not present

## 2013-05-05 DIAGNOSIS — R293 Abnormal posture: Secondary | ICD-10-CM | POA: Diagnosis not present

## 2013-05-05 DIAGNOSIS — M6281 Muscle weakness (generalized): Secondary | ICD-10-CM | POA: Diagnosis not present

## 2013-05-07 DIAGNOSIS — M6281 Muscle weakness (generalized): Secondary | ICD-10-CM | POA: Diagnosis not present

## 2013-05-07 DIAGNOSIS — R279 Unspecified lack of coordination: Secondary | ICD-10-CM | POA: Diagnosis not present

## 2013-05-07 DIAGNOSIS — G219 Secondary parkinsonism, unspecified: Secondary | ICD-10-CM | POA: Diagnosis not present

## 2013-05-07 DIAGNOSIS — R293 Abnormal posture: Secondary | ICD-10-CM | POA: Diagnosis not present

## 2013-05-09 DIAGNOSIS — G219 Secondary parkinsonism, unspecified: Secondary | ICD-10-CM | POA: Diagnosis not present

## 2013-05-09 DIAGNOSIS — R293 Abnormal posture: Secondary | ICD-10-CM | POA: Diagnosis not present

## 2013-05-09 DIAGNOSIS — M6281 Muscle weakness (generalized): Secondary | ICD-10-CM | POA: Diagnosis not present

## 2013-05-09 DIAGNOSIS — R279 Unspecified lack of coordination: Secondary | ICD-10-CM | POA: Diagnosis not present

## 2013-05-12 DIAGNOSIS — R293 Abnormal posture: Secondary | ICD-10-CM | POA: Diagnosis not present

## 2013-05-12 DIAGNOSIS — M6281 Muscle weakness (generalized): Secondary | ICD-10-CM | POA: Diagnosis not present

## 2013-05-12 DIAGNOSIS — R279 Unspecified lack of coordination: Secondary | ICD-10-CM | POA: Diagnosis not present

## 2013-05-12 DIAGNOSIS — G219 Secondary parkinsonism, unspecified: Secondary | ICD-10-CM | POA: Diagnosis not present

## 2013-05-14 DIAGNOSIS — M6281 Muscle weakness (generalized): Secondary | ICD-10-CM | POA: Diagnosis not present

## 2013-05-14 DIAGNOSIS — R279 Unspecified lack of coordination: Secondary | ICD-10-CM | POA: Diagnosis not present

## 2013-05-14 DIAGNOSIS — R293 Abnormal posture: Secondary | ICD-10-CM | POA: Diagnosis not present

## 2013-05-14 DIAGNOSIS — G219 Secondary parkinsonism, unspecified: Secondary | ICD-10-CM | POA: Diagnosis not present

## 2013-05-16 DIAGNOSIS — G219 Secondary parkinsonism, unspecified: Secondary | ICD-10-CM | POA: Diagnosis not present

## 2013-05-16 DIAGNOSIS — M6281 Muscle weakness (generalized): Secondary | ICD-10-CM | POA: Diagnosis not present

## 2013-05-16 DIAGNOSIS — R279 Unspecified lack of coordination: Secondary | ICD-10-CM | POA: Diagnosis not present

## 2013-05-16 DIAGNOSIS — R293 Abnormal posture: Secondary | ICD-10-CM | POA: Diagnosis not present

## 2013-05-22 ENCOUNTER — Other Ambulatory Visit: Payer: Self-pay | Admitting: Neurology

## 2013-05-27 ENCOUNTER — Encounter: Payer: Self-pay | Admitting: Neurology

## 2013-05-27 ENCOUNTER — Encounter (INDEPENDENT_AMBULATORY_CARE_PROVIDER_SITE_OTHER): Payer: Self-pay

## 2013-05-27 ENCOUNTER — Ambulatory Visit (INDEPENDENT_AMBULATORY_CARE_PROVIDER_SITE_OTHER): Payer: Medicare Other | Admitting: Neurology

## 2013-05-27 VITALS — BP 117/70 | HR 89 | Temp 97.7°F | Ht 71.0 in | Wt 207.0 lb

## 2013-05-27 DIAGNOSIS — G4733 Obstructive sleep apnea (adult) (pediatric): Secondary | ICD-10-CM | POA: Diagnosis not present

## 2013-05-27 DIAGNOSIS — G4730 Sleep apnea, unspecified: Secondary | ICD-10-CM

## 2013-05-27 DIAGNOSIS — G20 Parkinson's disease: Secondary | ICD-10-CM | POA: Diagnosis not present

## 2013-05-27 DIAGNOSIS — K5909 Other constipation: Secondary | ICD-10-CM

## 2013-05-27 DIAGNOSIS — K5900 Constipation, unspecified: Secondary | ICD-10-CM

## 2013-05-27 DIAGNOSIS — G4731 Primary central sleep apnea: Secondary | ICD-10-CM

## 2013-05-27 MED ORDER — PRAMIPEXOLE DIHYDROCHLORIDE 0.5 MG PO TABS: 0.5000 mg | ORAL_TABLET | Freq: Three times a day (TID) | ORAL | Status: DC

## 2013-05-27 MED ORDER — CARBIDOPA-LEVODOPA 25-100 MG PO TABS: ORAL_TABLET | ORAL | Status: DC

## 2013-05-27 NOTE — Progress Notes (Signed)
Subjective:    Patient ID: [name] is a 80 y.o. male.  HPI    Interim history:   [name] is a very pleasant 80 year old right-handed gentleman with an underlying medical history of hypertension, hyperlipidemia, history of cancer, ex-smoker, and complex sleep apnea on BiPAP ST, who presents for followup consultation off his left-sided predominant Parkinson's disease as well as his sleep apnea. He is accompanied by his wife again today. I last saw him on 11/27/2012, at which time I did not change his medication regimen with the exception of the timing for his Sinemet and Mirapex: I advised him to take it at 7 AM, 11 AM and 4 PM. I also asked him to continue using his BiPAP regularly and take it with him on any vacation trips. He was congratulated on his compliance.  Today, he reports being compliant with his BiPAP, averaging 4-5 hours each night. He has not been taking his Sinemet before meals, as he is active and he still takes his C/L with his meals on most days. His weight time is quite variable between 5 AM and 9 AM. Generally speaking he probably takes his morning Sinemet before his breakfast and the other 2 doses with his lunch and supper. He feels that his tremor is perhaps a little worse. His wife feels that he has more tremors but otherwise is stable. He denies any new cognitive issues, depression, hallucinations, anxiety, paranoid delusions or any other mood or cognitive related problems. He does have chronic constipation and takes an over-the-counter laxative and stool softener. He has a bowel movement every 2-3 days in general. He drinks 3 cups of coffee in the morning and may not be drinking enough water. He likes to workout side. He walks every day. He has not fallen recently. He has nocturia x1 typically. He has no new complaints today. He has had no recent illness.  I saw him on 07/26/2012 after had his sleep study. He has been compliant on BiPAP therapy. I had increased his  Sinemet to one pill 3 times a day. He was taking it with his meals and did not note any significant improvement in his symptoms, but, then again, he was taking it right after his meals. He was asking whether he should take his BiPAP machine with him to his planned trip to [place].  I first met him on 02/19/2012 after his baseline sleep study. His total AHI was 39.5 per hour based primarily on central apneas. His obstructive AHI was around 15 per hour. His oxyhemoglobin desaturation nadir was 85% and he spent 3 hours and 31 minutes below the saturation of 90% for the night. I asked him to come back for a CPAP titration study, possibly BiPAP but he wanted to hold off until his appointment in April as he was going to be out of town and he also wanted to bring his wife for discussion.  I then saw him back on 04/17/2012 and again went over his test results with him and his wife. He had been doing well from the PD standpoint. He agreed to come back for another sleep study with full night titration. His sleep titration study was on 05/14/2012 and I went over his test results with him and his wife in detail during our visit in July. Sleep efficiency was reduced at 71.5% with a latency to sleep of 2 minutes. Wake after sleep onset was highly elevated at 124 minutes with mild to moderate sleep fragmentation noted. He had  increased percentage of REM sleep at 27.2%. He had a normal REM latency. There were no significant aortic leg movements. He was started on CPAP at a pressure of 5 cm and titrated up to 9 cm of water pressure but he had significant central apneas and therefore changed to BiPAP at 11/7 and then switch to ST mode for ongoing central events. His final pressure was 13/9 with a backup rate of 10 on which she had a residual AHI of 0 per hour and supine REM sleep achieved. Oxyhemoglobin desaturation nadir on the final pressure was 90%.  He was placed on BiPAP ST at 13/9 cm with a backup rate of 10. I reviewed  his compliance from 06/26/2012 through 07/25/2012 (29 days), during which time he used it every day. His average usage was 5 hours and 6 minutes and percent used days greater than 4 hours was 96% indicating excellent compliance. His residual AHI was around 6 indicating fairly reasonable pressure settings. He reported tolerating the treatment and he changed from a FFM to a nasal mask. He denied depression, memory loss, lightheadedness, or hallucinations. I also reviewed more recent compliance data from 09/29/2012 through 10/28/2012 (30 days), during which time he used his machine every day. His average usage was 5 hours and 23 minutes, his percent used days greater than 4 hours was 29 days, which is 97%, indicating excellent compliance. His residual AHI was 2.6 per hour indicating an appropriate treatment setting of 13/9 cm with a backup rate of 10 per minute.  His Past Medical History Is Significant For: Past Medical History  Diagnosis Date  . Hypercholesteremia   . Cancer   . Parkinson's disease   . Hypertension   . Sleep disorder   . Ex-smoker     His Past Surgical History Is Significant For: Past Surgical History  Procedure Laterality Date  . Tonsillectomy Bilateral     His Family History Is Significant For: Family History  Problem Relation Age of Onset  . Dementia Mother   . Cancer Brother   . Stroke Brother     His Social History Is Significant For: History   Social History  . Marital Status: Married    Spouse Name: N/A    Number of Children: N/A  . Years of Education: N/A   Social History Main Topics  . Smoking status: Never Smoker   . Smokeless tobacco: None  . Alcohol Use: None  . Drug Use: None  . Sexual Activity: None   Other Topics Concern  . None   Social History Narrative  . None    His Allergies Are:  No Known Allergies:   His Current Medications Are:  Outpatient Encounter Prescriptions as of 05/27/2013  Medication Sig  . amLODipine (NORVASC) 10 MG  tablet Take 10 mg by mouth daily.  . calcium gluconate 500 MG tablet Take 500 mg by mouth daily.  . carbidopa-levodopa (SINEMET IR) 25-100 MG per tablet Take 1 tablet by mouth 3  times daily  . glucosamine-chondroitin 500-400 MG tablet Take 1 tablet by mouth once.  Marland Kitchen ketoconazole (NIZORAL) 2 % cream   . Multiple Vitamin (MULTIVITAMIN) tablet Take 1 tablet by mouth daily.  Marland Kitchen omeprazole (PRILOSEC OTC) 20 MG tablet Take 20 mg by mouth daily.  . pramipexole (MIRAPEX) 0.5 MG tablet Take 1 tablet (0.5 mg total) by mouth 3 (three) times daily.  Marland Kitchen telmisartan-hydrochlorothiazide (MICARDIS HCT) 80-25 MG per tablet Take 1 tablet by mouth daily.  . vitamin C (ASCORBIC ACID) 500  MG tablet Take 500 mg by mouth daily.  :  Review of Systems:  Out of a complete 14 point review of systems, all are reviewed and negative with the exception of these symptoms as listed below:   Review of Systems  Constitutional: Negative.   HENT: Positive for drooling.   Eyes: Negative.   Respiratory: Negative.   Cardiovascular: Negative.   Gastrointestinal: Positive for constipation.  Endocrine: Negative.   Genitourinary: Positive for urgency.  Musculoskeletal: Negative.   Skin: Negative.   Allergic/Immunologic: Negative.   Neurological: Positive for tremors.  Hematological: Negative.   Psychiatric/Behavioral: Negative.     Objective:  Neurologic Exam  Physical Exam Physical Examination:   Filed Vitals:   05/27/13 1122  BP: 117/70  Pulse: 89  Temp: 97.7 F (36.5 C)   General Examination: The patient is a very pleasant 80 y.o. male in no acute distress.  HEENT exam: He has a moderate degree of nuchal rigidity with a mild lower jaw and lip tremor, which is fairly constant. He has a mild to moderately masked facies with decreased eye blink rate. Hearing is mildly impaired. Funduscopic exam is normal bilaterally. He is status post cataract repairs bilaterally. He has prescription eyeglasses. Speech is mildly  to moderately hypophonic, but not dysarthric and minimal drooling is noted. Pupils are equal, round and reactive to light. On extraocular tracking he has mild saccadic breakdown especially on downward gaze. He has no carotid bruits. Chest is clear to auscultation without wheezing or rhonchi noted. Heart sounds are normal without murmurs, rubs or gallops noted. Abdomen is soft, nontender with normal bowel sounds appreciated. He has no pitting edema in the distal lower extremities bilaterally. No joint deformities are noted. Skin is warm and dry but he does have several old appearing bruises across the dorsi of his hands. Neurologically: Mental status: The patient is awake, alert and oriented in all 3 spheres. His memory, attention, language and knowledge are appropriate. Cranial nerves are as described under HEENT exam. In addition airway exam reveals a moderately tight airway secondary to a narrow airway entry. Tongue protrudes centrally and palate elevates symmetrically. Motor exam: Normal bulk and strength is noted. Tone is increased with some cogwheeling noted in the left upper extremity. Tone is also increased in the left lower extremity. Tone is minimally increased on the right side. He has a mild moderate constant resting tremor in the left upper extremity. He has an intermittent mild to slight resting tremor on the right upper extremity. Fine motor skills with finger taps and hand movements as well as rapid alternating patting is moderately impaired on the left and mild to moderately so on the right. Foot taps are mild to moderately impaired on the right and moderately so on the left. He stands up from the seated position without problems in his posture is mild to moderately stooped. He walks with decreased arm swing bilaterally. He has a fairly good stride length but decreased pace. He turns in 3 steps. His balance is fairly well preserved. Romberg is negative. Reflexes are 1+ in the UEs and absent in  the LEs. Sensory exam is unchanged: intact to light touch, pinprick, vibration and temperature sense in the upper extremities and decreased only to pinprick sensation in the distal lower extremities up to his mid shin area. Cerebellar testing shows no dysmetria or intention tremor on finger to nose testing. He has no truncal or gait ataxia.   Assessment and Plan:   In summary, [name]  is a very pleasant 80 year old male with a history of left-sided predominant Parkinson's disease, complicated by chronic constipation and he has complex sleep apnea treated with BiPAP. He is doing  fairly well at this time, but I did reinforce the timing of the Sinemet should be one hour before or 2 hours after his meals to ensure optimal absorption. He is therefore advised to take his Sinemet and Mirapex first thing in the morning, eat his breakfast about an hour after, eat lunch around noon which is typical for him and take a second dose of Sinemet and Mirapex at 2 PM, and his evening meal is around 6 PM, therefore he can take his Sinemet and Mirapex at 8 PM. I have encouraged him to increase his water intake. For chronic constipation down the Road we can consider Linzess, but in the interim, I have asked him to increase his water intake and increase his natural fiber intake. Overall he has remained fairly stable and I don't think there is a need to increase his medications just yet. He is advised to take his BiPAP machine with him on his trip to [place].  They are going to pick up their 2 grandsons as well. He is advised to continue using BiPAP regularly and is congratulated on his compliance. I will request a more recent compliance data. I suggested a 6 month checkup.  I renewed his prescriptions for Sinemet and Mirapex today. They are encouraged to call with any interim questions, concerns, problems or updates. They were in agreement.

## 2013-05-27 NOTE — Patient Instructions (Addendum)
Please try to take your Sinemet in the morning before your breakfast (1 hour before) and then 2 hour after lunch, around 2 PM, then 2 hours after supper, around 8 PM.   Use your BiPAP each night and with naps.   Drink more water, at least six 8 oz glasses per day.   We can consider a medication for chronic constipation down the road.

## 2013-06-10 DIAGNOSIS — L570 Actinic keratosis: Secondary | ICD-10-CM | POA: Diagnosis not present

## 2013-06-10 DIAGNOSIS — L821 Other seborrheic keratosis: Secondary | ICD-10-CM | POA: Diagnosis not present

## 2013-06-10 DIAGNOSIS — Z85828 Personal history of other malignant neoplasm of skin: Secondary | ICD-10-CM | POA: Diagnosis not present

## 2013-06-10 DIAGNOSIS — D1801 Hemangioma of skin and subcutaneous tissue: Secondary | ICD-10-CM | POA: Diagnosis not present

## 2013-06-19 DIAGNOSIS — G20 Parkinson's disease: Secondary | ICD-10-CM | POA: Diagnosis not present

## 2013-06-19 DIAGNOSIS — E785 Hyperlipidemia, unspecified: Secondary | ICD-10-CM | POA: Diagnosis not present

## 2013-06-19 DIAGNOSIS — R7309 Other abnormal glucose: Secondary | ICD-10-CM | POA: Diagnosis not present

## 2013-06-19 DIAGNOSIS — I10 Essential (primary) hypertension: Secondary | ICD-10-CM | POA: Diagnosis not present

## 2013-07-02 DIAGNOSIS — D3130 Benign neoplasm of unspecified choroid: Secondary | ICD-10-CM | POA: Diagnosis not present

## 2013-07-02 DIAGNOSIS — H278 Other specified disorders of lens: Secondary | ICD-10-CM | POA: Diagnosis not present

## 2013-09-09 DIAGNOSIS — I10 Essential (primary) hypertension: Secondary | ICD-10-CM | POA: Diagnosis not present

## 2013-09-09 DIAGNOSIS — R42 Dizziness and giddiness: Secondary | ICD-10-CM | POA: Diagnosis not present

## 2013-09-12 DIAGNOSIS — M549 Dorsalgia, unspecified: Secondary | ICD-10-CM | POA: Diagnosis not present

## 2013-09-12 DIAGNOSIS — M25559 Pain in unspecified hip: Secondary | ICD-10-CM | POA: Diagnosis not present

## 2013-09-15 ENCOUNTER — Other Ambulatory Visit (HOSPITAL_COMMUNITY): Payer: Self-pay | Admitting: Internal Medicine

## 2013-09-15 DIAGNOSIS — R011 Cardiac murmur, unspecified: Secondary | ICD-10-CM

## 2013-09-23 ENCOUNTER — Ambulatory Visit (HOSPITAL_COMMUNITY)
Admission: RE | Admit: 2013-09-23 | Discharge: 2013-09-23 | Disposition: A | Discharge status: Home / Self Care | Payer: Medicare Other | Source: Ambulatory Visit | Attending: Cardiovascular Disease | Admitting: Cardiovascular Disease

## 2013-09-23 ENCOUNTER — Other Ambulatory Visit (HOSPITAL_COMMUNITY): Payer: Self-pay | Admitting: Internal Medicine

## 2013-09-23 DIAGNOSIS — R011 Cardiac murmur, unspecified: Secondary | ICD-10-CM | POA: Diagnosis not present

## 2013-09-23 DIAGNOSIS — G4733 Obstructive sleep apnea (adult) (pediatric): Secondary | ICD-10-CM | POA: Diagnosis not present

## 2013-09-23 DIAGNOSIS — I359 Nonrheumatic aortic valve disorder, unspecified: Secondary | ICD-10-CM | POA: Insufficient documentation

## 2013-09-23 DIAGNOSIS — R42 Dizziness and giddiness: Secondary | ICD-10-CM | POA: Insufficient documentation

## 2013-09-23 DIAGNOSIS — I079 Rheumatic tricuspid valve disease, unspecified: Secondary | ICD-10-CM | POA: Insufficient documentation

## 2013-09-23 DIAGNOSIS — I10 Essential (primary) hypertension: Secondary | ICD-10-CM | POA: Diagnosis not present

## 2013-09-23 DIAGNOSIS — E785 Hyperlipidemia, unspecified: Secondary | ICD-10-CM | POA: Insufficient documentation

## 2013-09-23 NOTE — Progress Notes (Signed)
2D Echo Performed 09/23/2013    [name], RCS

## 2013-10-06 ENCOUNTER — Ambulatory Visit: Payer: Medicare Other | Admitting: Cardiovascular Disease

## 2013-10-13 DIAGNOSIS — M1991 Primary osteoarthritis, unspecified site: Secondary | ICD-10-CM | POA: Diagnosis not present

## 2013-10-13 DIAGNOSIS — E785 Hyperlipidemia, unspecified: Secondary | ICD-10-CM | POA: Diagnosis not present

## 2013-10-21 ENCOUNTER — Ambulatory Visit (INDEPENDENT_AMBULATORY_CARE_PROVIDER_SITE_OTHER): Payer: Medicare Other | Admitting: Cardiovascular Disease

## 2013-10-21 ENCOUNTER — Encounter: Payer: Self-pay | Admitting: Cardiovascular Disease

## 2013-10-21 VITALS — BP 122/68 | HR 92 | Ht 71.0 in | Wt 199.0 lb

## 2013-10-21 DIAGNOSIS — I10 Essential (primary) hypertension: Secondary | ICD-10-CM

## 2013-10-21 DIAGNOSIS — G903 Multi-system degeneration of the autonomic nervous system: Secondary | ICD-10-CM

## 2013-10-21 DIAGNOSIS — G4730 Sleep apnea, unspecified: Secondary | ICD-10-CM | POA: Diagnosis not present

## 2013-10-21 DIAGNOSIS — I350 Nonrheumatic aortic (valve) stenosis: Secondary | ICD-10-CM | POA: Diagnosis not present

## 2013-10-21 DIAGNOSIS — R42 Dizziness and giddiness: Secondary | ICD-10-CM | POA: Diagnosis not present

## 2013-10-21 DIAGNOSIS — G239 Degenerative disease of basal ganglia, unspecified: Secondary | ICD-10-CM

## 2013-10-21 DIAGNOSIS — I951 Orthostatic hypotension: Secondary | ICD-10-CM

## 2013-10-21 NOTE — Patient Instructions (Signed)
Your physician wants you to follow-up in: 1 year You will receive a reminder letter in the mail two months in advance. If you don't receive a letter, please call our office to schedule the follow-up appointment.    Your physician recommends that you continue on your current medications as directed. Please refer to the Current Medication list given to you today.     Thank you for choosing [facility] !  

## 2013-10-21 NOTE — Progress Notes (Signed)
Patient ID: [name], male   DOB: Apr 19, 1931, 80 y.o.   MRN: [id number]       CARDIOLOGY CONSULT NOTE  Patient ID: [name] MRN: [id number] DOB/AGE: 04/21/1931 80 y.o.  Admit date: (Not on file) Primary Physician Delphina Cahill, MD  Reason for Consultation: murmur, dizziness, HTN  HPI: The patient is an 80 yr old male with a PMH significant for Parkinson's disease, orthostatic hypotension, HTN, hyperlipidemia, prior h/o tobacco use, and sleep apnea. He was found to have a murmur by his PCP, Dr. Nevada Crane, who ordered an echocardiogram which showed normal LV systolic function, EF 02-63%, mild LVH, grade I diastolic dysfunction, severely calcified aortic valve with mild stenosis (mean gradient 17 mmHg), mild aortic root dilatation, and mild left atrial enlargement. ECG performed in the office today demonstrates normal sinus rhythm with PVC's.  He had an episode of dizziness in his PCP's office when attending an appointment with his wife, which was deemed secondary to orthostasis. He has not had any episodes since. He denies chest pain, palpitations, shortness of breath, leg swelling, orthopnea, PND, and syncope. 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 Had worked for [employer] in [place], [place] for decades and raised outside of [place]. Quit smoking cigarettes 35 yrs ago, quit smoking a pipe 25 yrs ago. Married.    No Known Allergies  Current Outpatient Prescriptions  Medication Sig Dispense Refill  . amLODipine (NORVASC) 10 MG tablet Take 10 mg by mouth daily.      . calcium gluconate 500 MG tablet Take 500 mg by mouth daily.      . carbidopa-levodopa (SINEMET IR) 25-100 MG per tablet Take 1 tablet by mouth 3  times daily  270 tablet  3  . diclofenac (VOLTAREN) 50 MG EC tablet Take 50 mg by mouth 2 (two) times daily.      Marland Kitchen glucosamine-chondroitin 500-400 MG tablet Take 1 tablet by mouth  once.      Marland Kitchen ketoconazole (NIZORAL) 2 % cream       . Multiple Vitamin (MULTIVITAMIN) tablet Take 1 tablet by mouth daily.      Marland Kitchen omeprazole (PRILOSEC OTC) 20 MG tablet Take 20 mg by mouth daily.      . pramipexole (MIRAPEX) 0.5 MG tablet Take 1 tablet (0.5 mg total) by mouth 3 (three) times daily.  270 tablet  3  . telmisartan-hydrochlorothiazide (MICARDIS HCT) 80-25 MG per tablet Take 1 tablet by mouth daily.      . vitamin C (ASCORBIC ACID) 500 MG tablet Take 500 mg by mouth daily.       No current facility-administered medications for this visit.    Past Medical History  Diagnosis Date  . Hypercholesteremia   . Cancer   . Parkinson's disease   . Hypertension   . Sleep disorder   . Ex-smoker     Past Surgical History  Procedure Laterality Date  . Tonsillectomy Bilateral     History   Social History  . Marital Status: Married    Spouse Name: N/A    Number of Children: N/A  . Years of Education: N/A   Occupational History  . Not on file.   Social History Main Topics  . Smoking status: Never Smoker   . Smokeless tobacco: Not on file  . Alcohol Use: Not on file  . Drug Use: Not on file  . Sexual Activity:  Not on file   Other Topics Concern  . Not on file   Social History Narrative  . No narrative on file     No family history of premature CAD in 1st degree relatives.  Prior to Admission medications   Medication Sig Start Date End Date Taking? Authorizing Provider  amLODipine (NORVASC) 10 MG tablet Take 10 mg by mouth daily.   Yes Historical Provider, MD  calcium gluconate 500 MG tablet Take 500 mg by mouth daily.   Yes Historical Provider, MD  carbidopa-levodopa (SINEMET IR) 25-100 MG per tablet Take 1 tablet by mouth 3  times daily 05/27/13  Yes Star Age, MD  diclofenac (VOLTAREN) 50 MG EC tablet Take 50 mg by mouth 2 (two) times daily.   Yes Historical Provider, MD  glucosamine-chondroitin 500-400 MG tablet Take 1 tablet by mouth once.   Yes Historical  Provider, MD  ketoconazole (NIZORAL) 2 % cream  01/11/12  Yes Historical Provider, MD  Multiple Vitamin (MULTIVITAMIN) tablet Take 1 tablet by mouth daily.   Yes Historical Provider, MD  omeprazole (PRILOSEC OTC) 20 MG tablet Take 20 mg by mouth daily.   Yes Historical Provider, MD  pramipexole (MIRAPEX) 0.5 MG tablet Take 1 tablet (0.5 mg total) by mouth 3 (three) times daily. 05/27/13  Yes Star Age, MD  telmisartan-hydrochlorothiazide (MICARDIS HCT) 80-25 MG per tablet Take 1 tablet by mouth daily.   Yes Historical Provider, MD  vitamin C (ASCORBIC ACID) 500 MG tablet Take 500 mg by mouth daily.   Yes Historical Provider, MD     Review of systems complete and found to be negative unless listed above in HPI     Physical exam Height 5\' 11"  (1.803 m), weight 199 lb (90.266 kg).  BP 122/68  Pulse 92   General: NAD Neck: No JVD, no thyromegaly or thyroid nodule.  Lungs: Clear to auscultation bilaterally with normal respiratory effort. CV: Nondisplaced PMI. Regular rate and rhythm, normal S1/S2, no S3/S4, III/VI crescendo-decrescendo systolic murmur heard throughout the precordium.  No peripheral edema.  No carotid bruit.  Mild venous varicosities b/l. Abdomen: Soft, nontender, no hepatosplenomegaly, no distention.  Skin: Intact without lesions or rashes.  Neurologic: Alert and oriented x 3. Tremor noted in hands. Psych: Normal affect. Extremities: No clubbing or cyanosis.  HEENT: Normal.   ECG: Most recent ECG reviewed.  Labs:   Lab Results  Component Value Date   WBC 4.9 11/26/2008   HGB 10.4* 11/26/2008   HCT 29.2* 11/26/2008   MCV 97.7 11/26/2008   PLT 173 11/26/2008   No results found for this basename: NA, K, CL, CO2, BUN, CREATININE, CALCIUM, LABALBU, PROT, BILITOT, ALKPHOS, ALT, AST, GLUCOSE,  in the last 168 hours No results found for this basename: CKTOTAL, CKMB, CKMBINDEX, TROPONINI    No results found for this basename: CHOL   No results found for this  basename: HDL   No results found for this basename: LDLCALC   No results found for this basename: TRIG   No results found for this basename: CHOLHDL   No results found for this basename: LDLDIRECT         Studies: Echo: - Left ventricle: The cavity size was normal. Wall thickness was increased in a pattern of mild LVH. Systolic function was normal. The estimated ejection fraction was in the range of 55% to 60%. Wall motion was normal; there were no regional wall motion abnormalities. Doppler parameters are consistent with abnormal left ventricular relaxation (grade 1 diastolic dysfunction). -  Aortic valve: Cusp separation was reduced. There was mild stenosis. - Aortic root: The aortic root was mildly dilated. - Mitral valve: Calcified annulus. - Left atrium: The atrium was mildly dilated.  Impressions:  - Normal LV function; grade 1 distaolic dysfunction; severely calcified aortic valve with mild AS by doppler (mean gradient 17 mmHg); mild LAE.   ASSESSMENT AND PLAN:  1. Aortic stenosis: Mild with mean gradient 17 mmHg. Asymptomatic. Will monitor clinically and echocardiography when deemed necessary. 2. Essential HTN: Controlled on amlodipine 10 mg and Micardis-HCTZ 80-25 mg. 3. Hyperlipidemia: Not on statin therapy. 4. Sleep apnea: On BiPAP. 5. Orthostatic hypotension: No further episodes. He likely has a degree of autonomic dysfunction given his Parkinson's Disease. 6. Parkinson's disease: On Sinemet.  Dispo: f/u 1 year.  Signed: Kate Sable, M.D., F.A.C.C.  10/21/2013, 1:45 PM

## 2013-10-23 DIAGNOSIS — Z23 Encounter for immunization: Secondary | ICD-10-CM | POA: Diagnosis not present

## 2013-10-29 DIAGNOSIS — M5440 Lumbago with sciatica, unspecified side: Secondary | ICD-10-CM | POA: Diagnosis not present

## 2013-11-03 ENCOUNTER — Other Ambulatory Visit (HOSPITAL_COMMUNITY): Payer: Self-pay | Admitting: Internal Medicine

## 2013-11-03 DIAGNOSIS — M5440 Lumbago with sciatica, unspecified side: Principal | ICD-10-CM

## 2013-11-03 DIAGNOSIS — M545 Low back pain, unspecified: Secondary | ICD-10-CM

## 2013-11-06 ENCOUNTER — Ambulatory Visit (HOSPITAL_COMMUNITY)
Admission: RE | Admit: 2013-11-06 | Discharge: 2013-11-06 | Disposition: A | Discharge status: Home / Self Care | Payer: Medicare Other | Source: Ambulatory Visit | Attending: Internal Medicine | Admitting: Internal Medicine

## 2013-11-06 ENCOUNTER — Other Ambulatory Visit (HOSPITAL_COMMUNITY): Payer: Self-pay | Admitting: Internal Medicine

## 2013-11-06 DIAGNOSIS — M5440 Lumbago with sciatica, unspecified side: Principal | ICD-10-CM

## 2013-11-06 DIAGNOSIS — M545 Low back pain, unspecified: Secondary | ICD-10-CM

## 2013-11-06 DIAGNOSIS — M479 Spondylosis, unspecified: Secondary | ICD-10-CM | POA: Insufficient documentation

## 2013-11-06 DIAGNOSIS — Z01818 Encounter for other preprocedural examination: Secondary | ICD-10-CM | POA: Diagnosis not present

## 2013-11-06 DIAGNOSIS — M5136 Other intervertebral disc degeneration, lumbar region: Secondary | ICD-10-CM | POA: Diagnosis not present

## 2013-11-06 DIAGNOSIS — M79662 Pain in left lower leg: Secondary | ICD-10-CM | POA: Insufficient documentation

## 2013-11-06 DIAGNOSIS — M47816 Spondylosis without myelopathy or radiculopathy, lumbar region: Secondary | ICD-10-CM | POA: Diagnosis not present

## 2013-11-06 DIAGNOSIS — M79661 Pain in right lower leg: Secondary | ICD-10-CM | POA: Diagnosis not present

## 2013-11-06 DIAGNOSIS — M4806 Spinal stenosis, lumbar region: Secondary | ICD-10-CM | POA: Insufficient documentation

## 2013-11-16 HISTORY — PX: BACK SURGERY: SHX140

## 2013-11-18 ENCOUNTER — Other Ambulatory Visit (HOSPITAL_COMMUNITY): Payer: Self-pay | Admitting: Neurosurgery

## 2013-11-18 DIAGNOSIS — M4316 Spondylolisthesis, lumbar region: Secondary | ICD-10-CM | POA: Diagnosis not present

## 2013-11-18 DIAGNOSIS — Z6828 Body mass index (BMI) 28.0-28.9, adult: Secondary | ICD-10-CM | POA: Diagnosis not present

## 2013-11-18 DIAGNOSIS — M4806 Spinal stenosis, lumbar region: Secondary | ICD-10-CM | POA: Diagnosis not present

## 2013-11-20 ENCOUNTER — Other Ambulatory Visit: Payer: Self-pay | Admitting: Dermatology

## 2013-11-20 DIAGNOSIS — D1801 Hemangioma of skin and subcutaneous tissue: Secondary | ICD-10-CM | POA: Diagnosis not present

## 2013-11-20 DIAGNOSIS — Z85828 Personal history of other malignant neoplasm of skin: Secondary | ICD-10-CM | POA: Diagnosis not present

## 2013-11-20 DIAGNOSIS — C44519 Basal cell carcinoma of skin of other part of trunk: Secondary | ICD-10-CM | POA: Diagnosis not present

## 2013-11-20 DIAGNOSIS — L570 Actinic keratosis: Secondary | ICD-10-CM | POA: Diagnosis not present

## 2013-11-20 DIAGNOSIS — L821 Other seborrheic keratosis: Secondary | ICD-10-CM | POA: Diagnosis not present

## 2013-11-20 DIAGNOSIS — D485 Neoplasm of uncertain behavior of skin: Secondary | ICD-10-CM | POA: Diagnosis not present

## 2013-11-27 ENCOUNTER — Ambulatory Visit: Payer: Medicare Other | Admitting: Neurology

## 2013-11-28 ENCOUNTER — Encounter (HOSPITAL_COMMUNITY): Payer: Self-pay

## 2013-11-28 ENCOUNTER — Encounter (HOSPITAL_COMMUNITY)
Admission: RE | Admit: 2013-11-28 | Discharge: 2013-11-28 | Disposition: A | Discharge status: Home / Self Care | Payer: Medicare Other | Source: Ambulatory Visit | Attending: Neurosurgery | Admitting: Neurosurgery

## 2013-11-28 DIAGNOSIS — Z87891 Personal history of nicotine dependence: Secondary | ICD-10-CM | POA: Insufficient documentation

## 2013-11-28 DIAGNOSIS — G4730 Sleep apnea, unspecified: Secondary | ICD-10-CM | POA: Insufficient documentation

## 2013-11-28 DIAGNOSIS — Z85828 Personal history of other malignant neoplasm of skin: Secondary | ICD-10-CM | POA: Diagnosis not present

## 2013-11-28 DIAGNOSIS — G20 Parkinson's disease: Secondary | ICD-10-CM | POA: Insufficient documentation

## 2013-11-28 DIAGNOSIS — E780 Pure hypercholesterolemia: Secondary | ICD-10-CM | POA: Insufficient documentation

## 2013-11-28 DIAGNOSIS — M4316 Spondylolisthesis, lumbar region: Secondary | ICD-10-CM | POA: Diagnosis not present

## 2013-11-28 DIAGNOSIS — K219 Gastro-esophageal reflux disease without esophagitis: Secondary | ICD-10-CM | POA: Diagnosis not present

## 2013-11-28 DIAGNOSIS — Z01812 Encounter for preprocedural laboratory examination: Secondary | ICD-10-CM | POA: Diagnosis not present

## 2013-11-28 DIAGNOSIS — I10 Essential (primary) hypertension: Secondary | ICD-10-CM | POA: Insufficient documentation

## 2013-11-28 HISTORY — DX: Gastro-esophageal reflux disease without esophagitis: K21.9

## 2013-11-28 HISTORY — DX: Unspecified osteoarthritis, unspecified site: M19.90

## 2013-11-28 LAB — CBC
HCT: 41.9 % (ref 39.0–52.0)
Hemoglobin: 15 g/dL (ref 13.0–17.0)
MCH: 35.5 pg — AB (ref 26.0–34.0)
MCHC: 35.8 g/dL (ref 30.0–36.0)
MCV: 99.3 fL (ref 78.0–100.0)
PLATELETS: 152 10*3/uL (ref 150–400)
RBC: 4.22 MIL/uL (ref 4.22–5.81)
RDW: 13.1 % (ref 11.5–15.5)
WBC: 7.4 10*3/uL (ref 4.0–10.5)

## 2013-11-28 LAB — ABO/RH: ABO/RH(D): O POS

## 2013-11-28 LAB — BASIC METABOLIC PANEL
ANION GAP: 13 (ref 5–15)
BUN: 30 mg/dL — ABNORMAL HIGH (ref 6–23)
CO2: 26 meq/L (ref 19–32)
CREATININE: 1.23 mg/dL (ref 0.50–1.35)
Calcium: 9.9 mg/dL (ref 8.4–10.5)
Chloride: 102 mEq/L (ref 96–112)
GFR calc non Af Amer: 54 mL/min — ABNORMAL LOW (ref >90)
GFR, EST AFRICAN AMERICAN: 62 mL/min — AB (ref >90)
Glucose, Bld: 85 mg/dL (ref 70–99)
Potassium: 3.8 mEq/L (ref 3.7–5.3)
Sodium: 141 mEq/L (ref 137–147)

## 2013-11-28 LAB — TYPE AND SCREEN
ABO/RH(D): O POS
Antibody Screen: NEGATIVE

## 2013-11-28 LAB — SURGICAL PCR SCREEN
MRSA, PCR: NEGATIVE
Staphylococcus aureus: NEGATIVE

## 2013-11-28 NOTE — Pre-Procedure Instructions (Signed)
[name]  11/28/2013   Your procedure is scheduled on:  Thursday, Nov. 19th   Report to [facility] Admitting at  10:00 AM.   Call this number if you have problems the morning of surgery: [phone]   Remember:   Do not eat food or drink liquids after midnight Wednesday.   Take these medicines the morning of surgery with A SIP OF WATER: Norvasc, Sinemet, Omeprazole   Do not wear jewelry - no rings or watches.  Do not wear lotions or colognes.  You may NOT wear deodorant the day of surgery.   Men may shave face and neck.   Do not bring valuables to the hospital.  [facility] is not responsible for any belongings or valuables.               Contacts, dentures or bridgework may not be worn into surgery.  Leave suitcase in the car. After surgery it may be brought to your room.  For patients admitted to the hospital, discharge time is determined by your treatment team.    Name and phone number of your driver:    Special Instructions: "Preparing for Surgery" instruction sheet.   Please read over the following fact sheets that you were given: Pain Booklet, Coughing and Deep Breathing, Blood Transfusion Information, MRSA Information and Surgical Site Infection Prevention

## 2013-12-01 ENCOUNTER — Encounter: Payer: Self-pay | Admitting: Neurology

## 2013-12-02 ENCOUNTER — Encounter: Payer: Self-pay | Admitting: Neurology

## 2013-12-02 ENCOUNTER — Ambulatory Visit (INDEPENDENT_AMBULATORY_CARE_PROVIDER_SITE_OTHER): Payer: Medicare Other | Admitting: Neurology

## 2013-12-02 VITALS — BP 111/67 | HR 102 | Temp 97.6°F | Wt 203.0 lb

## 2013-12-02 DIAGNOSIS — K5909 Other constipation: Secondary | ICD-10-CM

## 2013-12-02 DIAGNOSIS — K5900 Constipation, unspecified: Secondary | ICD-10-CM

## 2013-12-02 DIAGNOSIS — G20 Parkinson's disease: Secondary | ICD-10-CM | POA: Diagnosis not present

## 2013-12-02 DIAGNOSIS — M48061 Spinal stenosis, lumbar region without neurogenic claudication: Secondary | ICD-10-CM

## 2013-12-02 DIAGNOSIS — M4806 Spinal stenosis, lumbar region: Secondary | ICD-10-CM | POA: Diagnosis not present

## 2013-12-02 DIAGNOSIS — G4731 Primary central sleep apnea: Secondary | ICD-10-CM

## 2013-12-02 NOTE — Patient Instructions (Signed)
We will not make any changes to your medications. We will consider speech therapy next time after you have had your back surgery. You may experience a flare up in your parkinson's symptoms around your surgery. Physical and mental stress of any kind can make parkinson's symptoms worse.

## 2013-12-02 NOTE — Progress Notes (Signed)
Subjective:    Patient ID: [name] is a 80 y.o. male.  HPI     Interim history:   [name] is a very pleasant 80 year old right-handed gentleman with an underlying medical history of hypertension, hyperlipidemia, history of cancer, ex-smoker, and complex sleep apnea on BiPAP ST, who presents for followup consultation off his left-sided predominant Parkinson's disease as well as his sleep apnea. He is accompanied by his wife again today. I last saw him on 05/27/2013, at which time he reported being compliant with his BiPAP, averaging 4-5 hours each night. He was still taking his Sinemet with his meals on most days. He felt his tremor was a little worse. His wife felt that he was otherwise stable. He denied any new cognitive issues, depression, hallucinations, anxiety, delusions. He was drinking 3 cups of coffee in the morning and not enough water. I did not increase his medication but asked him to take his Sinemet away from his mealtimes. I considered Linzess for chronic constipation but asked him to increase his water intake and watch constipation symptoms before we use medications.  Today, I reviewed his compliance data from 10/31/2013 through 11/29/2013 which is a total of 30 days during which time he used his machine every night except for 1 night. Percent used days greater than 4 hours was 90% indicating excellent compliance. Residual AHI at 2.5 per hour, leaked low. Pressure at 13/9 with a rate of 10.  Today, he reports he will have L spine surgery on 12/04/13 under Dr. Christella Noa. He had an L spine MRI on 11/06/13: Degenerative lumbar spondylosis with multilevel disc disease and facet disease. There is bilateral lateral recess and bilateral foraminal stenosis at L2-3 and L3-4. The most significant level however is L4-5 with there is severe spinal, bilateral lateral recess and foraminal stenosis. His constipation can still be an issue. Of note, he does not drink enough water. He may drink  2 glasses of water and usually drinks 2-3 cups of coffee in the morning. He does need a stool softener. He has been advised to bring his BiPAP mask for his back surgery. He will need BiPAP when he is in the hospital. He has noted a little bit of additional stiffness and tremor. He takes Sinemet and Mirapex 3 times a day but not always on the same times. He again tends to take his second 2 doses around his mealtimes. This has been an ongoing issue.   I saw him on 11/27/2012, at which time I did not change his medication regimen with the exception of the timing for his Sinemet and Mirapex: I advised him to take it at 7 AM, 11 AM and 4 PM. I also asked him to continue using his BiPAP regularly and take it with him on any vacation trips. He was congratulated on his compliance.  I saw him on 07/26/2012 after had his sleep study. He has been compliant on BiPAP therapy. I had increased his Sinemet to one pill 3 times a day. He was taking it with his meals and did not note any significant improvement in his symptoms, but, then again, he was taking it right after his meals. He was asking whether he should take his BiPAP machine with him to his planned trip to [place].    I first met him on 02/19/2012 after his baseline sleep study. His total AHI was 39.5 per hour based primarily on central apneas. His obstructive AHI was around 15 per hour. His oxyhemoglobin desaturation nadir  was 85% and he spent 3 hours and 31 minutes below the saturation of 90% for the night. I asked him to come back for a CPAP titration study, possibly BiPAP but he wanted to hold off until his appointment in April as he was going to be out of town and he also wanted to bring his wife for discussion.   I then saw him back on 04/17/2012 and again went over his test results with him and his wife. He had been doing well from the PD standpoint. He agreed to come back for another sleep study with full night titration. His sleep titration study was on  05/14/2012 and I went over his test results with him and his wife in detail during our visit in July. Sleep efficiency was reduced at 71.5% with a latency to sleep of 2 minutes. Wake after sleep onset was highly elevated at 124 minutes with mild to moderate sleep fragmentation noted. He had increased percentage of REM sleep at 27.2%. He had a normal REM latency. There were no significant aortic leg movements. He was started on CPAP at a pressure of 5 cm and titrated up to 9 cm of water pressure but he had significant central apneas and therefore changed to BiPAP at 11/7 and then switch to ST mode for ongoing central events. His final pressure was 13/9 with a backup rate of 10 on which she had a residual AHI of 0 per hour and supine REM sleep achieved. Oxyhemoglobin desaturation nadir on the final pressure was 90%.   He was placed on BiPAP ST at 13/9 cm with a backup rate of 10. I reviewed his compliance from 06/26/2012 through 07/25/2012 (29 days), during which time he used it every day. His average usage was 5 hours and 6 minutes and percent used days greater than 4 hours was 96% indicating excellent compliance. His residual AHI was around 6 indicating fairly reasonable pressure settings. He reported tolerating the treatment and he changed from a FFM to a nasal mask. He denied depression, memory loss, lightheadedness, or hallucinations. I also reviewed more recent compliance data from 09/29/2012 through 10/28/2012 (30 days), during which time he used his machine every day. His average usage was 5 hours and 23 minutes, his percent used days greater than 4 hours was 29 days, which is 97%, indicating excellent compliance. His residual AHI was 2.6 per hour indicating an appropriate treatment setting of 13/9 cm with a backup rate of 10 per minute.  His Past Medical History Is Significant For: Past Medical History  Diagnosis Date  . Hypercholesteremia   . Parkinson's disease   . Hypertension   . Sleep disorder    . Ex-smoker   . Cancer     h/o  skin  cancer  . Sleep apnea     ?? sleep apnea....tested 2013 @ [facility]     . GERD (gastroesophageal reflux disease)   . Arthritis   . Lumbar stenosis     His Past Surgical History Is Significant For: Past Surgical History  Procedure Laterality Date  . Tonsillectomy Bilateral   . Meniscus repair      right knee  2000  . Eye surgery      bilateral  cataracts  . Appendectomy      His Family History Is Significant For: Family History  Problem Relation Age of Onset  . Dementia Mother   . Cancer Brother   . Stroke Brother     His Social History Is Significant  For: History   Social History  . Marital Status: Married    Spouse Name: Lovetta    Number of Children: 4  . Years of Education: 16   Social History Main Topics  . Smoking status: Former Smoker -- 1.00 packs/day    Types: Cigarettes, Pipe    Start date: 01/22/1951    Quit date: 01/21/1989  . Smokeless tobacco: Never Used  . Alcohol Use: No  . Drug Use: No  . Sexual Activity: None   Other Topics Concern  . None   Social History Narrative    His Allergies Are:  No Known Allergies:   His Current Medications Are:  Outpatient Encounter Prescriptions as of 12/02/2013  Medication Sig  . amLODipine (NORVASC) 10 MG tablet Take 10 mg by mouth daily.  . calcium gluconate 500 MG tablet Take 500 mg by mouth daily.  . carbidopa-levodopa (SINEMET IR) 25-100 MG per tablet Take 1 tablet by mouth 3  times daily  . Docusate Calcium (STOOL SOFTENER PO) Take by mouth 2 (two) times daily.  Marland Kitchen glucosamine-chondroitin 500-400 MG tablet Take 1 tablet by mouth once.  Marland Kitchen ketoconazole (NIZORAL) 2 % cream Apply 1 application topically daily as needed for irritation.   . Multiple Vitamin (MULTIVITAMIN) tablet Take 1 tablet by mouth daily.  Marland Kitchen omeprazole (PRILOSEC OTC) 20 MG tablet Take 20 mg by mouth daily.  . pramipexole (MIRAPEX) 0.5 MG tablet Take 1 tablet (0.5 mg total) by mouth 3  (three) times daily.  Marland Kitchen telmisartan-hydrochlorothiazide (MICARDIS HCT) 80-25 MG per tablet Take 1 tablet by mouth daily.  . vitamin C (ASCORBIC ACID) 500 MG tablet Take 500 mg by mouth daily.  :  Review of Systems:  Out of a complete 14 point review of systems, all are reviewed and negative with the exception of these symptoms as listed below:   Review of Systems  Gastrointestinal: Positive for constipation.  Musculoskeletal: Positive for back pain.    Objective:  Neurologic Exam  Physical Exam Physical Examination:   Filed Vitals:   12/02/13 0812  BP: 111/67  Pulse: 102  Temp: 97.6 F (36.4 C)   General Examination: The patient is a very pleasant 80 y.o. male in no acute distress.  HEENT exam: He has a moderate degree of nuchal rigidity with a mild lower jaw and lip tremor, which is fairly constant. He has a mild to moderately masked facies with decreased eye blink rate. Hearing is mildly impaired. Funduscopic exam is normal bilaterally. He is status post cataract repairs bilaterally. He has prescription eyeglasses. Speech is moderately hypophonic, but not dysarthric and minimal drooling is noted. Pupils are equal, round and reactive to light. On extraocular tracking he has mild saccadic breakdown especially on downward gaze. He has no carotid bruits. Chest is clear to auscultation without wheezing or rhonchi noted. Heart sounds are normal without murmurs, rubs or gallops noted. Abdomen is soft, nontender with normal bowel sounds appreciated. He has no pitting edema in the distal lower extremities bilaterally. No joint deformities are noted. Skin is warm and dry but he does have several old appearing bruises across the dorsi of his hands. Neurologically: Mental status: The patient is awake, alert and oriented in all 3 spheres. His memory, attention, language and knowledge are appropriate. Cranial nerves are as described under HEENT exam. In addition airway exam reveals a  moderately tight airway secondary to a narrow airway entry. Tongue protrudes centrally and palate elevates symmetrically. Motor exam: Normal bulk and strength is noted. Tone  is increased with some cogwheeling noted in both upper extremities. Tone is also increased in the left lower extremity. Tone is minimally increased on the right side. He has a mild to moderate constant resting tremor in the left upper extremity. He has an intermittent mild resting tremor on the right upper extremity. Fine motor skills with finger taps and hand movements as well as rapid alternating patting is moderately impaired on the left and mild to moderately so on the right. Foot taps are mild to moderately impaired on the right and moderately so on the left. He stands up from the seated position with mild out problems in his posture is mild to moderately stooped. He walks with decreased arm swing bilaterally. He has a fairly good stride length but decreased pace. He turns in 3 steps. His balance is fairly well preserved. Romberg is negative. Reflexes are 1+ in the UEs and absent in the LEs. Sensory exam is unchanged from before. Cerebellar testing shows no dysmetria or intention tremor on finger to nose testing. He has no truncal or gait ataxia.   Assessment and Plan:   In summary, ROMELO SCIANDRA is a very pleasant 80 year old male with a history of left-sided predominant Parkinson's disease, complicated by chronic constipation, severe mixed sleep apnea which is treated well with BiPAP therapy and chronic back pain with need for surgery which is planned for 12/04/2013 under Dr. Cyndy Freeze. He has had mild progression in his motor symptoms and signs. I explained to him that this is probably not a good time to try to change his Parkinson's medications. We will revisit his medication regimen after he has had surgery in rehabilitation. I advised him to drink more water. He is also advised that we can expect for his Parkinson symptoms to  flare up around his surgery because any type of physical and/or mental stress can exacerbate any PD symptoms. We just need to be mentally prepared for this. Down the road I am going to consider an increase in his Sinemet. For chronic constipation down the Road we can consider Linzess, but in the interim, I have asked him to increase his water intake and increase his natural fiber intake. Overall he has well over the course of time. He is compliant with BiPAP therapy and today we also discussed his initial sleep study results from January 2014 and his compliance data. I commended him for being compliant with treatment. I wished him best of luck for his surgery and I will plan to see him back in about 3 months, sooner if need be. I answered all their questions today and they were in agreement. Of note we will also consider speech therapy down the road.

## 2013-12-03 MED ORDER — CEFAZOLIN SODIUM-DEXTROSE 2-3 GM-% IV SOLR
2.0000 g | INTRAVENOUS | Status: AC
  Administered 2013-12-04: 2 g via INTRAVENOUS
  Filled 2013-12-03: qty 50

## 2013-12-04 ENCOUNTER — Inpatient Hospital Stay (HOSPITAL_COMMUNITY): Payer: Medicare Other | Admitting: Anesthesiology

## 2013-12-04 ENCOUNTER — Encounter (HOSPITAL_COMMUNITY)
Admission: RE | Disposition: A | Discharge status: Home Health | Payer: Self-pay | Source: Ambulatory Visit | Attending: Neurosurgery

## 2013-12-04 ENCOUNTER — Encounter (HOSPITAL_COMMUNITY): Payer: Self-pay | Admitting: Certified Registered Nurse Anesthetist

## 2013-12-04 ENCOUNTER — Inpatient Hospital Stay (HOSPITAL_COMMUNITY): Payer: Medicare Other

## 2013-12-04 ENCOUNTER — Inpatient Hospital Stay (HOSPITAL_COMMUNITY)
Admission: RE | Admit: 2013-12-04 | Discharge: 2013-12-10 | DRG: 460 | Disposition: A | Discharge status: Home Health | Payer: Medicare Other | Source: Ambulatory Visit | Attending: Neurosurgery | Admitting: Neurosurgery

## 2013-12-04 DIAGNOSIS — G4730 Sleep apnea, unspecified: Secondary | ICD-10-CM | POA: Diagnosis present

## 2013-12-04 DIAGNOSIS — Z87891 Personal history of nicotine dependence: Secondary | ICD-10-CM

## 2013-12-04 DIAGNOSIS — M4316 Spondylolisthesis, lumbar region: Principal | ICD-10-CM | POA: Diagnosis present

## 2013-12-04 DIAGNOSIS — I10 Essential (primary) hypertension: Secondary | ICD-10-CM | POA: Diagnosis present

## 2013-12-04 DIAGNOSIS — M4326 Fusion of spine, lumbar region: Secondary | ICD-10-CM | POA: Diagnosis not present

## 2013-12-04 DIAGNOSIS — K219 Gastro-esophageal reflux disease without esophagitis: Secondary | ICD-10-CM | POA: Diagnosis present

## 2013-12-04 DIAGNOSIS — E780 Pure hypercholesterolemia: Secondary | ICD-10-CM | POA: Diagnosis present

## 2013-12-04 DIAGNOSIS — M4726 Other spondylosis with radiculopathy, lumbar region: Secondary | ICD-10-CM | POA: Diagnosis present

## 2013-12-04 DIAGNOSIS — M4806 Spinal stenosis, lumbar region: Secondary | ICD-10-CM | POA: Diagnosis present

## 2013-12-04 DIAGNOSIS — R1312 Dysphagia, oropharyngeal phase: Secondary | ICD-10-CM | POA: Diagnosis not present

## 2013-12-04 DIAGNOSIS — R1313 Dysphagia, pharyngeal phase: Secondary | ICD-10-CM | POA: Diagnosis not present

## 2013-12-04 DIAGNOSIS — M1990 Unspecified osteoarthritis, unspecified site: Secondary | ICD-10-CM | POA: Diagnosis not present

## 2013-12-04 DIAGNOSIS — Z419 Encounter for procedure for purposes other than remedying health state, unspecified: Secondary | ICD-10-CM

## 2013-12-04 DIAGNOSIS — G20 Parkinson's disease: Secondary | ICD-10-CM | POA: Diagnosis present

## 2013-12-04 DIAGNOSIS — M48061 Spinal stenosis, lumbar region without neurogenic claudication: Secondary | ICD-10-CM | POA: Diagnosis present

## 2013-12-04 SURGERY — POSTERIOR LUMBAR FUSION 1 LEVEL
Anesthesia: General | Site: Back

## 2013-12-04 MED ORDER — LIDOCAINE HCL (CARDIAC) 20 MG/ML IV SOLN
INTRAVENOUS | Status: DC | PRN
  Administered 2013-12-04: 80 mg via INTRAVENOUS

## 2013-12-04 MED ORDER — OMEPRAZOLE MAGNESIUM 20 MG PO TBEC: 20.0000 mg | DELAYED_RELEASE_TABLET | Freq: Every day | ORAL | Status: DC

## 2013-12-04 MED ORDER — ONDANSETRON HCL 4 MG/2ML IJ SOLN
INTRAMUSCULAR | Status: DC | PRN
  Administered 2013-12-04: 4 mg via INTRAVENOUS

## 2013-12-04 MED ORDER — PHENOL 1.4 % MT LIQD
1.0000 | OROMUCOSAL | Status: DC | PRN
  Administered 2013-12-05: 1 via OROMUCOSAL
  Filled 2013-12-04: qty 177

## 2013-12-04 MED ORDER — PANTOPRAZOLE SODIUM 40 MG PO TBEC
40.0000 mg | DELAYED_RELEASE_TABLET | Freq: Every day | ORAL | Status: DC
  Administered 2013-12-05 – 2013-12-10 (×6): 40 mg via ORAL
  Filled 2013-12-04 (×7): qty 1

## 2013-12-04 MED ORDER — HYDROCHLOROTHIAZIDE 25 MG PO TABS
25.0000 mg | ORAL_TABLET | Freq: Every day | ORAL | Status: DC
Start: 2013-12-04 — End: 2013-12-10
  Administered 2013-12-05 – 2013-12-10 (×6): 25 mg via ORAL
  Filled 2013-12-04 (×7): qty 1

## 2013-12-04 MED ORDER — ARTIFICIAL TEARS OP OINT
TOPICAL_OINTMENT | OPHTHALMIC | Status: AC
  Filled 2013-12-04: qty 3.5

## 2013-12-04 MED ORDER — FENTANYL CITRATE 0.05 MG/ML IJ SOLN
INTRAMUSCULAR | Status: AC
  Filled 2013-12-04: qty 5

## 2013-12-04 MED ORDER — ALBUMIN HUMAN 5 % IV SOLN
INTRAVENOUS | Status: DC | PRN
  Administered 2013-12-04: 14:00:00 via INTRAVENOUS

## 2013-12-04 MED ORDER — TELMISARTAN-HCTZ 80-25 MG PO TABS: 1.0000 | ORAL_TABLET | Freq: Every day | ORAL | Status: DC

## 2013-12-04 MED ORDER — GLUCOSAMINE-CHONDROITIN 500-400 MG PO TABS
1.0000 | ORAL_TABLET | Freq: Once | ORAL | Status: DC
  Filled 2013-12-04: qty 1

## 2013-12-04 MED ORDER — CYCLOBENZAPRINE HCL 10 MG PO TABS: 10.0000 mg | ORAL_TABLET | Freq: Three times a day (TID) | ORAL | Status: DC | PRN

## 2013-12-04 MED ORDER — LIDOCAINE-EPINEPHRINE 0.5 %-1:200000 IJ SOLN
INTRAMUSCULAR | Status: DC | PRN
  Administered 2013-12-04: 10 mL

## 2013-12-04 MED ORDER — POTASSIUM CHLORIDE IN NACL 20-0.9 MEQ/L-% IV SOLN
INTRAVENOUS | Status: DC
  Administered 2013-12-04 – 2013-12-07 (×6): via INTRAVENOUS
  Filled 2013-12-04 (×8): qty 1000

## 2013-12-04 MED ORDER — OXYCODONE-ACETAMINOPHEN 5-325 MG PO TABS
1.0000 | ORAL_TABLET | ORAL | Status: DC | PRN
  Administered 2013-12-05: 2 via ORAL
  Administered 2013-12-05 – 2013-12-10 (×16): 1 via ORAL
  Filled 2013-12-04 (×2): qty 1
  Filled 2013-12-04: qty 2
  Filled 2013-12-04 (×5): qty 1
  Filled 2013-12-04: qty 2
  Filled 2013-12-04 (×4): qty 1
  Filled 2013-12-04: qty 2
  Filled 2013-12-04 (×2): qty 1
  Filled 2013-12-04: qty 2
  Filled 2013-12-04: qty 1

## 2013-12-04 MED ORDER — SENNA 8.6 MG PO TABS
1.0000 | ORAL_TABLET | Freq: Two times a day (BID) | ORAL | Status: DC
  Administered 2013-12-05 – 2013-12-10 (×11): 8.6 mg via ORAL
  Filled 2013-12-04 (×11): qty 1

## 2013-12-04 MED ORDER — ARTIFICIAL TEARS OP OINT
TOPICAL_OINTMENT | OPHTHALMIC | Status: DC | PRN
  Administered 2013-12-04: 1 via OPHTHALMIC

## 2013-12-04 MED ORDER — PHENYLEPHRINE HCL 10 MG/ML IJ SOLN
10.0000 mg | INTRAMUSCULAR | Status: DC | PRN
  Administered 2013-12-04: 10 ug/min via INTRAVENOUS

## 2013-12-04 MED ORDER — GLYCOPYRROLATE 0.2 MG/ML IJ SOLN
INTRAMUSCULAR | Status: AC
  Filled 2013-12-04: qty 3

## 2013-12-04 MED ORDER — ACETAMINOPHEN 650 MG RE SUPP: 650.0000 mg | RECTAL | Status: DC | PRN

## 2013-12-04 MED ORDER — CARBIDOPA-LEVODOPA 25-100 MG PO TABS
1.0000 | ORAL_TABLET | Freq: Three times a day (TID) | ORAL | Status: DC
  Administered 2013-12-05 – 2013-12-10 (×16): 1 via ORAL
  Filled 2013-12-04 (×16): qty 1

## 2013-12-04 MED ORDER — LIDOCAINE HCL (CARDIAC) 20 MG/ML IV SOLN
INTRAVENOUS | Status: AC
  Filled 2013-12-04: qty 5

## 2013-12-04 MED ORDER — NEOSTIGMINE METHYLSULFATE 10 MG/10ML IV SOLN
INTRAVENOUS | Status: DC | PRN
  Administered 2013-12-04: 3 mg via INTRAVENOUS

## 2013-12-04 MED ORDER — ONDANSETRON HCL 4 MG/2ML IJ SOLN
INTRAMUSCULAR | Status: AC
  Filled 2013-12-04: qty 2

## 2013-12-04 MED ORDER — LACTATED RINGERS IV SOLN
INTRAVENOUS | Status: DC
  Administered 2013-12-04: 11:00:00 via INTRAVENOUS

## 2013-12-04 MED ORDER — HYDROMORPHONE HCL 1 MG/ML IJ SOLN: 0.2500 mg | INTRAMUSCULAR | Status: DC | PRN

## 2013-12-04 MED ORDER — EPHEDRINE SULFATE 50 MG/ML IJ SOLN: INTRAMUSCULAR | Status: DC | PRN

## 2013-12-04 MED ORDER — ACETAMINOPHEN 325 MG PO TABS: 650.0000 mg | ORAL_TABLET | ORAL | Status: DC | PRN

## 2013-12-04 MED ORDER — GLYCOPYRROLATE 0.2 MG/ML IJ SOLN
INTRAMUSCULAR | Status: DC | PRN
  Administered 2013-12-04: 0.4 mg via INTRAVENOUS

## 2013-12-04 MED ORDER — PROPOFOL 10 MG/ML IV BOLUS
INTRAVENOUS | Status: DC | PRN
  Administered 2013-12-04: 200 mg via INTRAVENOUS

## 2013-12-04 MED ORDER — ROCURONIUM BROMIDE 50 MG/5ML IV SOLN
INTRAVENOUS | Status: AC
  Filled 2013-12-04: qty 1

## 2013-12-04 MED ORDER — PHENYLEPHRINE HCL 10 MG/ML IJ SOLN
INTRAMUSCULAR | Status: DC | PRN
  Administered 2013-12-04: 120 ug via INTRAVENOUS

## 2013-12-04 MED ORDER — FENTANYL CITRATE 0.05 MG/ML IJ SOLN
INTRAMUSCULAR | Status: DC | PRN
  Administered 2013-12-04: 50 ug via INTRAVENOUS
  Administered 2013-12-04 (×2): 100 ug via INTRAVENOUS
  Administered 2013-12-04 (×2): 50 ug via INTRAVENOUS

## 2013-12-04 MED ORDER — LACTATED RINGERS IV SOLN
INTRAVENOUS | Status: DC | PRN
  Administered 2013-12-04 (×3): via INTRAVENOUS

## 2013-12-04 MED ORDER — MORPHINE SULFATE 2 MG/ML IJ SOLN
1.0000 mg | INTRAMUSCULAR | Status: DC | PRN
  Administered 2013-12-04 – 2013-12-05 (×4): 2 mg via INTRAVENOUS
  Filled 2013-12-04 (×5): qty 1

## 2013-12-04 MED ORDER — SODIUM CHLORIDE 0.9 % IV SOLN: 250.0000 mL | INTRAVENOUS | Status: DC

## 2013-12-04 MED ORDER — ROCURONIUM BROMIDE 100 MG/10ML IV SOLN
INTRAVENOUS | Status: DC | PRN
  Administered 2013-12-04: 40 mg via INTRAVENOUS

## 2013-12-04 MED ORDER — SUCCINYLCHOLINE CHLORIDE 20 MG/ML IJ SOLN
INTRAMUSCULAR | Status: DC | PRN
  Administered 2013-12-04: 100 mg via INTRAVENOUS

## 2013-12-04 MED ORDER — IRBESARTAN 300 MG PO TABS
300.0000 mg | ORAL_TABLET | Freq: Every day | ORAL | Status: DC
  Administered 2013-12-05 – 2013-12-10 (×6): 300 mg via ORAL
  Filled 2013-12-04 (×7): qty 1

## 2013-12-04 MED ORDER — HYDROCODONE-ACETAMINOPHEN 5-325 MG PO TABS
1.0000 | ORAL_TABLET | ORAL | Status: DC | PRN
  Filled 2013-12-04: qty 2

## 2013-12-04 MED ORDER — BUPIVACAINE HCL 0.5 % IJ SOLN
INTRAMUSCULAR | Status: DC | PRN
  Administered 2013-12-04: 30 mL

## 2013-12-04 MED ORDER — ONE-DAILY MULTI VITAMINS PO TABS
1.0000 | ORAL_TABLET | Freq: Every day | ORAL | Status: DC
  Administered 2013-12-07: 1 via ORAL
  Filled 2013-12-04 (×5): qty 1

## 2013-12-04 MED ORDER — SODIUM CHLORIDE 0.9 % IJ SOLN
3.0000 mL | Freq: Two times a day (BID) | INTRAMUSCULAR | Status: DC
  Administered 2013-12-04 – 2013-12-09 (×5): 3 mL via INTRAVENOUS

## 2013-12-04 MED ORDER — NEOSTIGMINE METHYLSULFATE 10 MG/10ML IV SOLN
INTRAVENOUS | Status: AC
  Filled 2013-12-04: qty 1

## 2013-12-04 MED ORDER — MENTHOL 3 MG MT LOZG
1.0000 | LOZENGE | OROMUCOSAL | Status: DC | PRN
  Filled 2013-12-04: qty 9

## 2013-12-04 MED ORDER — BACITRACIN ZINC 500 UNIT/GM EX OINT
TOPICAL_OINTMENT | CUTANEOUS | Status: DC | PRN
  Administered 2013-12-04: 1 via TOPICAL

## 2013-12-04 MED ORDER — AMLODIPINE BESYLATE 10 MG PO TABS
10.0000 mg | ORAL_TABLET | Freq: Every day | ORAL | Status: DC
  Administered 2013-12-05 – 2013-12-10 (×6): 10 mg via ORAL
  Filled 2013-12-04 (×6): qty 1

## 2013-12-04 MED ORDER — VITAMIN C 500 MG PO TABS
500.0000 mg | ORAL_TABLET | Freq: Every day | ORAL | Status: DC
  Administered 2013-12-07 – 2013-12-10 (×4): 500 mg via ORAL
  Filled 2013-12-04 (×7): qty 1

## 2013-12-04 MED ORDER — PRAMIPEXOLE DIHYDROCHLORIDE 0.125 MG PO TABS
0.5000 mg | ORAL_TABLET | Freq: Three times a day (TID) | ORAL | Status: DC
  Administered 2013-12-05: 0.5 mg via ORAL
  Filled 2013-12-04: qty 4

## 2013-12-04 MED ORDER — ONDANSETRON HCL 4 MG/2ML IJ SOLN: 4.0000 mg | Freq: Once | INTRAMUSCULAR | Status: DC | PRN

## 2013-12-04 MED ORDER — POLYETHYLENE GLYCOL 3350 17 G PO PACK
17.0000 g | PACK | Freq: Every day | ORAL | Status: DC | PRN
Start: 2013-12-04 — End: 2013-12-10
  Administered 2013-12-07 – 2013-12-09 (×3): 17 g via ORAL
  Filled 2013-12-04 (×3): qty 1

## 2013-12-04 MED ORDER — KETOCONAZOLE 2 % EX CREA
1.0000 "application " | TOPICAL_CREAM | Freq: Every day | CUTANEOUS | Status: DC | PRN
  Filled 2013-12-04: qty 15

## 2013-12-04 MED ORDER — 0.9 % SODIUM CHLORIDE (POUR BTL) OPTIME
TOPICAL | Status: DC | PRN
  Administered 2013-12-04: 1000 mL

## 2013-12-04 MED ORDER — CALCIUM GLUCONATE 500 MG PO TABS
500.0000 mg | ORAL_TABLET | Freq: Every day | ORAL | Status: DC
  Administered 2013-12-07 – 2013-12-09 (×3): 500 mg via ORAL
  Filled 2013-12-04 (×7): qty 1

## 2013-12-04 MED ORDER — ONDANSETRON HCL 4 MG/2ML IJ SOLN: 4.0000 mg | INTRAMUSCULAR | Status: DC | PRN

## 2013-12-04 MED ORDER — THROMBIN 20000 UNITS EX SOLR
CUTANEOUS | Status: DC | PRN
  Administered 2013-12-04: 16:00:00 via TOPICAL

## 2013-12-04 MED ORDER — SODIUM CHLORIDE 0.9 % IJ SOLN: 3.0000 mL | INTRAMUSCULAR | Status: DC | PRN

## 2013-12-04 SURGICAL SUPPLY — 73 items
BENZOIN TINCTURE PRP APPL 2/3 (GAUZE/BANDAGES/DRESSINGS) IMPLANT
BLADE CLIPPER SURG (BLADE) IMPLANT
BUR MATCHSTICK NEURO 3.0 LAGG (BURR) ×3 IMPLANT
CAGE COROENT MP 11X23X9 (Cage) ×6 IMPLANT
CANISTER SUCT 3000ML (MISCELLANEOUS) ×3 IMPLANT
CLOSURE WOUND 1/2 X4 (GAUZE/BANDAGES/DRESSINGS)
CONT SPEC 4OZ CLIKSEAL STRL BL (MISCELLANEOUS) ×3 IMPLANT
COVER BACK TABLE 60X90IN (DRAPES) ×3 IMPLANT
DECANTER SPIKE VIAL GLASS SM (MISCELLANEOUS) ×3 IMPLANT
DRAPE C-ARM 42X72 X-RAY (DRAPES) ×6 IMPLANT
DRAPE C-ARMOR (DRAPES) ×3 IMPLANT
DRAPE LAPAROTOMY 100X72X124 (DRAPES) ×3 IMPLANT
DRAPE POUCH INSTRU U-SHP 10X18 (DRAPES) ×3 IMPLANT
DRAPE SURG 17X23 STRL (DRAPES) ×3 IMPLANT
DRSG OPSITE POSTOP 4X6 (GAUZE/BANDAGES/DRESSINGS) ×3 IMPLANT
DRSG OPSITE POSTOP 4X8 (GAUZE/BANDAGES/DRESSINGS) ×3 IMPLANT
DRSG TELFA 3X8 NADH (GAUZE/BANDAGES/DRESSINGS) IMPLANT
DURAPREP 26ML APPLICATOR (WOUND CARE) ×3 IMPLANT
ELECT BLADE 4.0 EZ CLEAN MEGAD (MISCELLANEOUS) ×3
ELECT REM PT RETURN 9FT ADLT (ELECTROSURGICAL) ×3
ELECTRODE BLDE 4.0 EZ CLN MEGD (MISCELLANEOUS) ×1 IMPLANT
ELECTRODE REM PT RTRN 9FT ADLT (ELECTROSURGICAL) ×1 IMPLANT
GAUZE SPONGE 4X4 12PLY STRL (GAUZE/BANDAGES/DRESSINGS) IMPLANT
GAUZE SPONGE 4X4 16PLY XRAY LF (GAUZE/BANDAGES/DRESSINGS) IMPLANT
GLOVE BIO SURGEON STRL SZ 6.5 (GLOVE) ×4 IMPLANT
GLOVE BIO SURGEON STRL SZ8 (GLOVE) ×3 IMPLANT
GLOVE BIO SURGEONS STRL SZ 6.5 (GLOVE) ×2
GLOVE BIOGEL PI IND STRL 6.5 (GLOVE) ×1 IMPLANT
GLOVE BIOGEL PI IND STRL 7.0 (GLOVE) ×1 IMPLANT
GLOVE BIOGEL PI INDICATOR 6.5 (GLOVE) ×2
GLOVE BIOGEL PI INDICATOR 7.0 (GLOVE) ×2
GLOVE ECLIPSE 6.5 STRL STRAW (GLOVE) ×3 IMPLANT
GLOVE EXAM NITRILE LRG STRL (GLOVE) IMPLANT
GLOVE EXAM NITRILE MD LF STRL (GLOVE) IMPLANT
GLOVE EXAM NITRILE XL STR (GLOVE) IMPLANT
GLOVE EXAM NITRILE XS STR PU (GLOVE) IMPLANT
GLOVE SURG SS PI 7.0 STRL IVOR (GLOVE) ×6 IMPLANT
GOWN STRL REUS W/ TWL LRG LVL3 (GOWN DISPOSABLE) ×2 IMPLANT
GOWN STRL REUS W/ TWL XL LVL3 (GOWN DISPOSABLE) ×2 IMPLANT
GOWN STRL REUS W/TWL 2XL LVL3 (GOWN DISPOSABLE) IMPLANT
GOWN STRL REUS W/TWL LRG LVL3 (GOWN DISPOSABLE) ×4
GOWN STRL REUS W/TWL XL LVL3 (GOWN DISPOSABLE) ×4
KIT BASIN OR (CUSTOM PROCEDURE TRAY) ×3 IMPLANT
KIT POSITION SURG JACKSON T1 (MISCELLANEOUS) ×3 IMPLANT
KIT ROOM TURNOVER OR (KITS) ×3 IMPLANT
LIQUID BAND (GAUZE/BANDAGES/DRESSINGS) ×3 IMPLANT
NEEDLE HYPO 22GX1.5 SAFETY (NEEDLE) ×3 IMPLANT
NEEDLE HYPO 25X1 1.5 SAFETY (NEEDLE) ×3 IMPLANT
NEEDLE SPNL 18GX3.5 QUINCKE PK (NEEDLE) ×3 IMPLANT
NS IRRIG 1000ML POUR BTL (IV SOLUTION) ×3 IMPLANT
PACK LAMINECTOMY NEURO (CUSTOM PROCEDURE TRAY) ×3 IMPLANT
PAD ARMBOARD 7.5X6 YLW CONV (MISCELLANEOUS) ×6 IMPLANT
ROD 35MM (Rod) ×6 IMPLANT
SCREW LOCK (Screw) ×8 IMPLANT
SCREW LOCK FXNS SPNE MAS PL (Screw) ×4 IMPLANT
SCREW SHANKS 5.5X35 (Screw) ×12 IMPLANT
SCREW TULIP 5.5 (Screw) ×12 IMPLANT
SPONGE LAP 4X18 X RAY DECT (DISPOSABLE) IMPLANT
SPONGE SURGIFOAM ABS GEL 100 (HEMOSTASIS) ×3 IMPLANT
STRIP CLOSURE SKIN 1/2X4 (GAUZE/BANDAGES/DRESSINGS) IMPLANT
SUT ETHILON 3 0 FSL (SUTURE) ×3 IMPLANT
SUT PROLENE 6 0 BV (SUTURE) IMPLANT
SUT VIC AB 0 CT1 18XCR BRD8 (SUTURE) ×2 IMPLANT
SUT VIC AB 0 CT1 8-18 (SUTURE) ×4
SUT VIC AB 2-0 CT1 18 (SUTURE) ×9 IMPLANT
SUT VIC AB 3-0 SH 8-18 (SUTURE) ×3 IMPLANT
SYR 20ML ECCENTRIC (SYRINGE) ×3 IMPLANT
SYR CONTROL 10ML LL (SYRINGE) ×3 IMPLANT
TOWEL OR 17X24 6PK STRL BLUE (TOWEL DISPOSABLE) ×3 IMPLANT
TOWEL OR 17X26 10 PK STRL BLUE (TOWEL DISPOSABLE) ×3 IMPLANT
TRAY FOLEY CATH 14FRSI W/METER (CATHETERS) IMPLANT
TRAY FOLEY CATH 16FRSI W/METER (SET/KITS/TRAYS/PACK) ×3 IMPLANT
WATER STERILE IRR 1000ML POUR (IV SOLUTION) ×3 IMPLANT

## 2013-12-04 NOTE — Progress Notes (Signed)
Patient unable to swallow PO meds. Physician notified.

## 2013-12-04 NOTE — Op Note (Signed)
12/04/2013  6:39 PM  PATIENT:  [name]  80 y.o. male  PRE-OPERATIVE DIAGNOSIS:  Spondylolisthesis, lumbar spondylosis, lumbar stenosis, lumbar radiculopathy and lateral recess stenosis  POST-OPERATIVE DIAGNOSIS:  Spondylolisthesis, lumbar spondylosis, lumbar stenosis, lumbar radiculopathy and lateral recess stenosis  PROCEDURE:  Procedure(s): Lumbar four-five posterior lumbar interbody fusion with interbody peek prostheses, 77mmx 39mmx 4 degrees, local autograft L4, L5 decompression beyond the necessary exposure for a plif.  posterior lateral arthrodesis L4/5, morselized autograft  posterior nonsegmental instrumentation pedicle screws(nuvasive, cortical screws) L4/5  SURGEON:  Surgeon(s): Ashok Pall, MD Eustace Moore, MD  ASSISTANTS:jones, david  ANESTHESIA:   general  EBL:  Total I/O In: 2250 [I.V.:2000; IV Piggyback:250] Out: 1300 [Urine:800; Blood:500]  BLOOD ADMINISTERED:none  CELL SAVER GIVEN:none  COUNT:per nursing  DRAINS: none   SPECIMEN:  No Specimen  DICTATION: MEDARDO HASSING is a 79 y.o. male whom was taken to the operating room intubated, and placed under a general anesthetic without difficulty. A foley catheter was placed under sterile conditions. He was positioned prone on a Jackson stable with all pressure points properly padded.  His lumbar region was prepped and draped in a sterile manner. I infiltrated 10cc's 1/2%lidocaine/1:2000,000 strength epinephrine into the planned incision. I opened the skin with a 10 blade and took the incision down to the thoracolumbar fascia. I exposed the lamina of L3,4,and 5 in a subperiosteal fashion bilaterally. I confirmed my location with an intraoperative xray.  I placed self retaining retractors and started the decompression.  I decompressed the spinal canal and the lateral recesses at L4/5, in order to decompress the L4, and L5 roots. I performed a laminectomy of L4, and partial laminectomy of L5. I performed  partial inferior facetectomies of L4 bilaterally to free the L4 roots. I followed the roots through the neural foramina using the Kerrison punch to remove ligament and bone so that the nerve roots had a free egress from the canal. The thecal sac was well decompressed after the laminectomies and removal of the hypertrophied ligamentum flavum. I used the Leksell rongeur, the drill, and the Kerrison punches to decompress the canal.  PLIF's were performed at L4/5 in the same fashion. I opened the disc space with a 15 blade then used a variety of instruments to remove the disc and prepare the space for the arthrodesis. I used curettes, rongeurs, punches, shavers for the disc space, and rasps in the discetomy. I measured the disc space and placed 11x48mm  Peek cages(nuvasive) into the disc space(s).  I decorticated the lateral bone at L4, and 5. I then placed local autograft on the decorticated surfaces to complete the posterolateral arthrodesis.  We placed pedicle screws at L4, and L5, using fluoroscopic guidance. I drilled a pilot hole, then cannulated the pedicle with a drill at each site. I then tapped each pedicle, assessing each site for pedicle violations. No cutouts were appreciated. Screws (Nuvasive cortical) were then placed at each site without difficulty. Final films were performed and all screws appeared to be in good position.  I closed the wound in a layered fashion. I approximated the thoracolumbar fascia, subcutaneous, and subcuticular planes with vicryl sutures. i closed the skin with a running nylon suture.  I used an occlusive bandage for a sterile dressing.     PLAN OF CARE: Admit to inpatient   PATIENT DISPOSITION:  PACU - hemodynamically stable.   Delay start of Pharmacological VTE agent (>24hrs) due to surgical blood loss or risk of bleeding:  yes

## 2013-12-04 NOTE — Anesthesia Preprocedure Evaluation (Addendum)
Anesthesia Evaluation  Patient identified by MRN, date of birth, ID band Patient awake    Reviewed: Allergy & Precautions, H&P , NPO status , Patient's Chart, lab work & pertinent test results  Airway Mallampati: II       Dental   Pulmonary sleep apnea , former smoker,  breath sounds clear to auscultation        Cardiovascular hypertension, Rhythm:Regular Rate:Normal     Neuro/Psych    GI/Hepatic Neg liver ROS, GERD-  ,  Endo/Other  negative endocrine ROS  Renal/GU negative Renal ROS     Musculoskeletal   Abdominal   Peds  Hematology   Anesthesia Other Findings   Reproductive/Obstetrics                            Anesthesia Physical Anesthesia Plan  ASA: III  Anesthesia Plan: General   Post-op Pain Management:    Induction: Intravenous  Airway Management Planned: Oral ETT  Additional Equipment:   Intra-op Plan:   Post-operative Plan: Extubation in OR  Informed Consent: I have reviewed the patients History and Physical, chart, labs and discussed the procedure including the risks, benefits and alternatives for the proposed anesthesia with the patient or authorized representative who has indicated his/her understanding and acceptance.     Plan Discussed with: CRNA and Anesthesiologist  Anesthesia Plan Comments:         Anesthesia Quick Evaluation

## 2013-12-04 NOTE — Anesthesia Postprocedure Evaluation (Signed)
  Anesthesia Post-op Note  Patient: [name]  Procedure(s) Performed: Procedure(s) with comments: Lumbar four-five posterior lumbar interbody fusion with interbody prosthesis posterior lateral arthrodesis and posterior nonsegmental instrumentation (N/A) - Lumbar four-five posterior lumbar interbody fusion with interbody prosthesis posterior lateral arthrodesis and posterior nonsegmental instrumentation  Patient Location: PACU  Anesthesia Type:General  Level of Consciousness: awake, oriented, sedated and patient cooperative  Airway and Oxygen Therapy: Patient Spontanous Breathing  Post-op Pain: mild  Post-op Assessment: Post-op Vital signs reviewed, Patient's Cardiovascular Status Stable, Respiratory Function Stable, Patent Airway, No signs of Nausea or vomiting and Pain level controlled  Post-op Vital Signs: stable  Last Vitals:  Filed Vitals:   12/04/13 1900  BP:   Pulse: 127  Temp:   Resp: 15    Complications: No apparent anesthesia complications

## 2013-12-04 NOTE — Progress Notes (Signed)
Patient arrived from PACU via stretcher, accompanied by family. Patient oriented to room and equipment. Initial assessment performed. Honeycomb dressing clean, dry, and intact. NAD noted. Will monitor closely.

## 2013-12-04 NOTE — Anesthesia Procedure Notes (Signed)
Procedure Name: Intubation Date/Time: 12/04/2013 2:15 PM Performed by: Ned Grace Pre-anesthesia Checklist: Patient identified, Timeout performed, Emergency Drugs available, Suction available and Patient being monitored Patient Re-evaluated:Patient Re-evaluated prior to inductionOxygen Delivery Method: Circle system utilized Preoxygenation: Pre-oxygenation with 100% oxygen Intubation Type: IV induction Ventilation: Mask ventilation without difficulty Laryngoscope size: Glidescope -- sm adult size. Grade View: Grade II Tube type: Oral Tube size: 7.5 mm Number of attempts: 1 Airway Equipment and Method: Stylet Placement Confirmation: ETT inserted through vocal cords under direct vision,  breath sounds checked- equal and bilateral and positive ETCO2 Secured at: 21 cm Tube secured with: Tape Dental Injury: Teeth and Oropharynx as per pre-operative assessment  Difficulty Due To: Difficulty was anticipated and Difficult Airway- due to anterior larynx

## 2013-12-04 NOTE — H&P (Signed)
  BP 140/78 mmHg  Pulse 95  Temp(Src) 98 F (36.7 C) (Oral)  Resp 18  Ht 5\' 11"  (1.803 m)  Wt 92.08 kg (203 lb)  BMI 28.33 kg/m2  SpO2 97% HISTORY OF PRESENT ILLNESS: [name] is an 80 year old gentleman who presents today for evaluation of pain which he has in his back and both lower extremities. He has had this pain now for the last month or so. He said the pain goes down the back of both legs and it is extremely bad. He is moving quite slowly now secondary to the pain if he will get around at all. He said first thing in the morning the pain is just extraordinarily bad and if he sits for prolonged period of time and then gets up again the pain is quite severe. He said he can walk to relieve the pain, but if he walks for too long then the pain is severe. He has no pain when sitting. He has no pain when lying down. [name] has good bowel and bladder function. REVIEW OF SYSTEMS: Positive for eye glasses, hypertension, hypercholesterolemia, leg weakness, back pain, skin cancer, some bleeding problems. He denies allergic, endocrine, psychiatric, neurological, genitourinary, gastrointestinal, respiratory, ears, nose, throat, mouth, constitutional problems. He did not list the nature of his bleeding tendencies and will have to get that information from Dr. Nevada Crane. PAST MEDICAL HISTORY:  Current Medical Conditions: Significant for hypertension and Parkinson's disease. He has numbness and tingling in the legs.  Prior Operations: He has had knee operation in the past.  Medications and Allergies: No known drug allergies. He takes amlodipine, Micartan, promipexole, and carbidopa and levadopa. SOCIAL HISTORY: He does not smoke. He does not use alcohol. He does not use illicit drugs. PHYSICAL EXAMINATION: Vital signs today, height 5 feet 10-1/4 inches, weight 202 pounds, blood pressure 132/71, pulse 98, respiratory rate 16, temperature 98.1, pain is 5/10. On examination, he is alert and oriented x4  and answers all questions appropriately. He has classic pill-rolling tremor in his left hand which becomes higher in frequency as he essentially evacuated and moves. He has normal strength in the upper and lower extremities. Some slight rigidity in both the upper and lower extremities. Toes are downgoing to plantar stimulation. 1+ reflex elicited at the ankles. 1+ at the knees. 1+ in the upper extremities. Proprioception is intact in both the upper and lower extremities. Facies are slightly masked. Pupils are equal, round and reactive to light. Full extraocular movements. Full visual fields. Hearing is intact to voice. Symmetric facial sensation and movement. DIAGNOSTIC STUDIES: MRI of the lumbar spine was reviewed showing profound stenosis at L4-5. Some foraminal narrowing at 3-4. Some foraminal narrowing at 2-3, but nothing as close to 4-5. Foramen are obliterated and the central canal is nearly obliterated. The conus is normal. Cauda equina is normal. DIAGNOSES: Lumbar radiculopathy, lumbar spondylolisthesis as he does have a slight listhesis which is degenerative in its cause at L4-5 and lumbar stenosis. I have reasonably offered [name] a lumbar decompression and subsequent fusion secondary to the listhesis and the amount of bone that would have to be removed to relieve the pressure on the neural foramina bilaterally. I would use pedicle screws. I would use rods and I would plan on using interbody cages. I think his recovery will be long somewhere near to four to six months. He will be able to walk afterwards, but it will take him some time. He understands and wishes to proceed.

## 2013-12-04 NOTE — Transfer of Care (Signed)
Immediate Anesthesia Transfer of Care Note  Patient: [name]  Procedure(s) Performed: Procedure(s) with comments: Lumbar four-five posterior lumbar interbody fusion with interbody prosthesis posterior lateral arthrodesis and posterior nonsegmental instrumentation (N/A) - Lumbar four-five posterior lumbar interbody fusion with interbody prosthesis posterior lateral arthrodesis and posterior nonsegmental instrumentation  Patient Location: PACU  Anesthesia Type:General  Level of Consciousness: awake, alert  and patient cooperative  Airway & Oxygen Therapy: Patient Spontanous Breathing and Patient connected to nasal cannula oxygen  Post-op Assessment: Report given to PACU RN and Post -op Vital signs reviewed and stable  Post vital signs: Reviewed and stable  Complications: No apparent anesthesia complications

## 2013-12-05 MED ORDER — SODIUM CHLORIDE 0.9 % IV BOLUS (SEPSIS)
500.0000 mL | Freq: Once | INTRAVENOUS | Status: AC
Start: 2013-12-05 — End: 2013-12-05
  Administered 2013-12-05: 500 mL via INTRAVENOUS

## 2013-12-05 MED ORDER — PRAMIPEXOLE DIHYDROCHLORIDE 0.125 MG PO TABS: 0.5000 mg | ORAL_TABLET | Freq: Three times a day (TID) | ORAL | Status: DC

## 2013-12-05 MED ORDER — PRAMIPEXOLE DIHYDROCHLORIDE 0.125 MG PO TABS
0.5000 mg | ORAL_TABLET | Freq: Three times a day (TID) | ORAL | Status: DC
  Administered 2013-12-05 – 2013-12-10 (×15): 0.5 mg via ORAL
  Filled 2013-12-05 (×16): qty 4

## 2013-12-05 MED ORDER — RESOURCE THICKENUP CLEAR PO POWD
ORAL | Status: DC | PRN
  Filled 2013-12-05 (×2): qty 125

## 2013-12-05 NOTE — Plan of Care (Signed)
Problem: Acute Rehab PT Goals(only PT should resolve) Goal: Pt Will Go Supine/Side To Sit Without bedrails    Goal: Pt Will Transfer Bed To Chair/Chair To Bed On LRAD

## 2013-12-05 NOTE — Progress Notes (Signed)
Occupational Therapy Evaluation Patient Details Name: [name] MRN: [id number] DOB: 11/08/1931 Today's Date: 12/05/2013    History of Present Illness 80 y.o. male admitted for L4-5 PLIF and arthrodesis due tp L4-5 spinal, bilateral lateral recess and foraminal stenosis. PMH significant for Parkinson's (affecting L side predominantly), HTN,  hyperlipidemia, hx of CA, complex sleep apnea on BiPAP ST, and GERD.   Clinical Impression   Patient is s/p above surgery resulting in functional limitations due to the deficits listed below (see OT problem list). Session limited today due to back brace not in room yet. Pt had been up in chair for ~ 2 hours on OT arrival and was assisted back to bed. Pt with LOB x2 and lateral/posterior lean during sit-stand tranfer with single UE supported. Pt required min assist to maintain balance while standing to void bladder due to tremors (Parkinson's). Patient will benefit from skilled OT acutely to increase independence and safety with ADLS to allow discharge home with [facility] services.     Follow Up Recommendations  Home health OT;Supervision/Assistance - 24 hour    Equipment Recommendations  Other (comment) (RW-2 wheeled)    Recommendations for Other Services Speech consult (Painful swallow, choking-liquids, secretion sides of mouth)     Precautions / Restrictions Precautions Precautions: Back;Fall Precaution Booklet Issued: Yes (comment) Precaution Comments: Reviewed 3/3 back precautions Required Braces or Orthoses: Spinal Brace Spinal Brace: Other (comment) Spinal Brace Comments: No brace  in room; pt has not been fitted for one yet Restrictions Weight Bearing Restrictions: No      Mobility Bed Mobility Overal bed mobility: Needs Assistance Bed Mobility: Rolling;Sit to Sidelying Rolling: Min assist       Sit to sidelying: Mod assist;+2 for safety/equipment General bed mobility comments: HOB flat; no use of bedrails. Mod +2 (A) to  lower trunk and to elevate bil UE onto bed. Min (A) to initiate safe rolling technique. Verbal cues for hand placement and proper log roll technique.  Transfers Overall transfer level: Needs assistance Equipment used: Rolling walker (2 wheeled) Transfers: Sit to/from Stand Sit to Stand: Mod assist;+2 physical assistance         General transfer comment: Mod +2 (A) for boost to elevate and for safety/balance. Verbal and tactile cues for safe hand placement and to maintain back precautions.    Balance Overall balance assessment: Needs assistance Sitting-balance support: No upper extremity supported;Feet supported Sitting balance-Leahy Scale: Fair     Standing balance support: Single extremity supported;During functional activity Standing balance-Leahy Scale: Poor Standing balance comment: Pt demonstrating L lateral and posterior lean with single UE support while therapist was adjusting height of RW. Required mod (A) to regain balance and return to midline.                            ADL Overall ADL's : Needs assistance/impaired                         Toilet Transfer: Minimal assistance;Ambulation;RW (Standing; urinal jug) Toilet Transfer Details (indicate cue type and reason): Min (A) to maintain balance.          Functional mobility during ADLs: Minimal assistance;Rolling walker General ADL Comments: Session limited due to no back brace in room. Pt had been up in recliner for ~ 2 hours. Pt (A) back to bed. Pt voided bladder small amount standing with urinal jug.     Vision  Perception     Praxis      Pertinent Vitals/Pain Pain Assessment: 0-10 Pain Score: 5  Pain Location: middle of back Pain Descriptors / Indicators: Sore Pain Intervention(s): Limited activity within patient's tolerance;Monitored during session;Premedicated before session;Repositioned     Hand Dominance Right   Extremity/Trunk Assessment Upper  Extremity Assessment Upper Extremity Assessment: Generalized weakness   Lower Extremity Assessment Lower Extremity Assessment: Defer to PT evaluation   Cervical / Trunk Assessment Cervical / Trunk Assessment: Normal   Communication Communication Communication: No difficulties   Cognition Arousal/Alertness: Awake/alert Behavior During Therapy: WFL for tasks assessed/performed Overall Cognitive Status: Within Functional Limits for tasks assessed                     General Comments       Exercises       Shoulder Instructions      Home Living Family/patient expects to be discharged to:: Private residence Living Arrangements: Spouse/significant other Available Help at Discharge: Family;Available 24 hours/day Type of Home: House Home Access: Stairs to enter [employer] of Steps: 1 Entrance Stairs-Rails: None Home Layout: Multi-level;Able to live on main level with bedroom/bathroom Alternate Level Stairs-Number of Steps: Flight   Bathroom Shower/Tub: Walk-in shower;Door   Bathroom Toilet: Handicapped height     Home Equipment: Environmental consultant - 4 wheels;Bedside commode;Shower seat - built in;Grab bars - toilet;Other (comment) (Tempur-pedic adjustable bed)          Prior Functioning/Environment Level of Independence: Needs assistance  Gait / Transfers Assistance Needed: Uses RW and lift chair/recliner          OT Diagnosis: Generalized weakness;Acute pain   OT Problem List: Decreased strength;Decreased range of motion;Decreased activity tolerance;Impaired balance (sitting and/or standing);Decreased coordination;Decreased safety awareness;Decreased knowledge of use of DME or AE;Decreased knowledge of precautions;Pain   OT Treatment/Interventions: Self-care/ADL training;Therapeutic exercise;Energy conservation;DME and/or AE instruction;Therapeutic activities;Patient/family education;Balance training    OT Goals(Current goals can be found in the care plan  section) Acute Rehab OT Goals Patient Stated Goal: to play golf and go camping again OT Goal Formulation: With patient Time For Goal Achievement: 12/19/13 Potential to Achieve Goals: Good ADL Goals Pt Will Perform Lower Body Bathing: with adaptive equipment;sit to/from stand;with min guard assist Pt Will Transfer to Toilet: with supervision;ambulating;bedside commode;grab bars Pt Will Perform Toileting - Clothing Manipulation and hygiene: with supervision;sit to/from stand Additional ADL Goal #1: Pt will verbalize and demonstrate 3/3 back precautions with 100% accuracy prior to discharge. Additional ADL Goal #2: Pt will don/doff back brace independently while seated EOB.  OT Frequency: Min 2X/week   Barriers to D/C:            Co-evaluation              End of Session Equipment Utilized During Treatment: Gait belt;Rolling walker Nurse Communication: Mobility status;Precautions;Other (comment) (Pneumonia & aspiration precautions; decr productive cough)  Activity Tolerance: Patient tolerated treatment well;Other (comment) (Session limited by no back brace in room) Patient left: in bed;with call bell/phone within reach;with bed alarm set;with family/visitor present   Time: 5188-4166 OT Time Calculation (min): 24 min Charges:    G-Codes:    Redmond Baseman 12-07-13, 11:03 AM

## 2013-12-05 NOTE — Clinical Social Work Psychosocial (Signed)
Clinical Social Work Department BRIEF PSYCHOSOCIAL ASSESSMENT 12/05/2013  Patient:  [name], [name]     Account Number:  [id number]     Admit date:  12/04/2013  Clinical Social Worker:  Glendon Axe, CLINICAL SOCIAL WORKER  Date/Time:  12/05/2013 03:42 PM  Referred by:  Physician  Date Referred:  12/05/2013 Referred for  SNF Placement   Other Referral:   Interview type:  Family Other interview type:   CSW spoke with pt and pt's family (wife and daughter) at bedside.    PSYCHOSOCIAL DATA Living Status:  WIFE Admitted from facility:   Level of care:   Primary support name:  Kedron Uno Primary support relationship to patient:  SPOUSE Degree of support available:   Strong    CURRENT CONCERNS Current Concerns  Post-Acute Placement   Other Concerns:    SOCIAL WORK ASSESSMENT / PLAN Clinical Social Worker spoke with pt and pt's wife and daughter at bedside in reference to post-acute placement for SNF. CSW explained SNF process and provided pt and pt's family with SNF list. Pt's family reported they would like pt placed in [place] area which is closer to their home. CSW will submit pt's clinicals. CSW will also continue to follow pt and pt's family for continued support and to facilitate pt's discharge needs once medically stable.   Assessment/plan status:  Psychosocial Support/Ongoing Assessment of Needs Other assessment/ plan:   Information/referral to community resources:   SNF list provided.    PATIENT'S/FAMILY'S RESPONSE TO PLAN OF CARE: Pt sitting at bedside, alert and oriented. Pt's wife and daughter at bedside as well. Pt and pt's family pleasant and agreeable to SNF placement.     Glendon Axe, MSW, LCSWA [phone] 12/05/2013 4:03 PM

## 2013-12-05 NOTE — Progress Notes (Signed)
   12/05/13 2155  BiPAP/CPAP/SIPAP  BiPAP/CPAP/SIPAP Pt Type Adult  Mask Type Nasal mask  Mask Size Medium  Respiratory Rate 18 breaths/min  IPAP 10 cmH20  EPAP 10 cmH2O  Oxygen Percent 21 %  BiPAP/CPAP/SIPAP CPAP  Patient Home Equipment No (Except for mask)  Auto Titrate No  Patient placed on CPAP per home settings.

## 2013-12-05 NOTE — Evaluation (Signed)
Clinical/Bedside Swallow Evaluation Patient Details  Name: [name] MRN: [id number] Date of Birth: [birth year]  Today's Date: 12/05/2013 Time: 1159-1220 SLP Time Calculation (min) (ACUTE ONLY): 21 min  Past Medical History:  Past Medical History  Diagnosis Date  . Hypercholesteremia   . Parkinson's disease   . Hypertension   . Sleep disorder   . Ex-smoker   . Cancer     h/o  skin  cancer  . Sleep apnea     ?? sleep apnea....tested 2013 @ [facility]     . GERD (gastroesophageal reflux disease)   . Arthritis   . Lumbar stenosis    Past Surgical History:  Past Surgical History  Procedure Laterality Date  . Tonsillectomy Bilateral   . Meniscus repair      right knee  2000  . Eye surgery      bilateral  cataracts  . Appendectomy     HPI:  Pt is an 80 year old male who underwent lumbar decompression and fusion at L4-5 on 12/04/13. He has intubted for the procedure. He has developed difficulty swallowing medications.    Assessment / Plan / Recommendation Clinical Impression  Pt demonstrates immediate evidence of aspiration with thin liquids with suspected delay in swallow initiation. Thickened liquids were attempted resulting in wet vocal quality and delayed coughing, still concerning for aspiration. Pt aware of difficulty and agreeable to FEES to objectively assess swallow function given risk of aspiration is deconditioned pt with Parkinsons. Plan for FEES today, instructed wife to avoid liquids and give pudding on lunch tray.     Aspiration Risk  Severe    Diet Recommendation Dysphagia 1 (Puree)   Medication Administration: Whole meds with puree Postural Changes and/or Swallow Maneuvers: Seated upright 90 degrees    Other  Recommendations Recommended Consults: FEES   Follow Up Recommendations  Inpatient Rehab    Frequency and Duration        Pertinent Vitals/Pain NA    SLP Swallow Goals     Swallow Study Prior Functional Status  Type of  Home: House Available Help at Discharge: Family;Available 24 hours/day    General HPI: Pt is an 80 year old male who underwent lumbar decompression and fusion at L4-5 on 12/04/13. He has intubted for the procedure. He has developed difficulty swallowing medications.  Type of Study: Bedside swallow evaluation Diet Prior to this Study: Thin liquids Temperature Spikes Noted: No Respiratory Status: Room air History of Recent Intubation: Yes Length of Intubations (days): 1 days (for procedure) Date extubated: 12/04/13 Behavior/Cognition: Alert;Cooperative;Pleasant mood Oral Cavity - Dentition: Adequate natural dentition Self-Feeding Abilities: Able to feed self;Needs assist Patient Positioning: Upright in bed Baseline Vocal Quality: Low vocal intensity;Hoarse Volitional Cough: Strong Volitional Swallow: Able to elicit    Oral/Motor/Sensory Function Overall Oral Motor/Sensory Function: Appears within functional limits for tasks assessed   Ice Chips Ice chips: Not tested   Thin Liquid Thin Liquid: Impaired Presentation: Cup;Straw;Self Fed Pharyngeal  Phase Impairments: Decreased hyoid-laryngeal movement;Throat Clearing - Immediate;Cough - Immediate    Nectar Thick Nectar Thick Liquid: Impaired Presentation: Cup;Straw;Self Fed Pharyngeal Phase Impairments: Suspected delayed Swallow;Wet Vocal Quality;Multiple swallows;Cough - Delayed   Honey Thick Honey Thick Liquid: Not tested   Puree Puree: Within functional limits   Solid   GO    Solid: Not tested      Nicholas Baltimore, MA CCC-SLP [phone]  [name], Katherene Ponto 12/05/2013,1:34 PM

## 2013-12-05 NOTE — Progress Notes (Signed)
CARE MANAGEMENT NOTE 12/05/2013  Patient:  [name], [name]   Account Number:  [id number]  Date Initiated:  12/05/2013  Documentation initiated by:  Olga Coaster  Subjective/Objective Assessment:   ADMITTED FOR SURGERY     Action/Plan:   CM FOLLOWING FOR DCP   Anticipated DC Date:  12/09/2013   Anticipated DC Plan:  AWAITING ON PT/OT EVALS FOR DISPOSITION NEEDS     DC Planning Services  CM consult        Status of service:  In process, will continue to follow  Per UR Regulation:  Reviewed for med. necessity/level of care/duration of stay  Comments:  11/20/2015Mindi Slicker RN,BSN,MHA [phone]

## 2013-12-05 NOTE — Progress Notes (Signed)
Thank you for consult on [name]. Therapy evaluation done today and patient with significant deficits with SNF recommendations for follow up therapy. Will follow up on Monday past therapy this weekend to monitor for progress and tolerance.

## 2013-12-05 NOTE — Progress Notes (Signed)
Patient ID: [name], male   DOB: 1931/01/27, 80 y.o.   MRN: [id number] BP 100/56 mmHg  Pulse 93  Temp(Src) 98.8 F (37.1 C) (Oral)  Resp 16  Ht 5\' 11"  (1.803 m)  Wt 94.711 kg (208 lb 12.8 oz)  BMI 29.13 kg/m2  SpO2 92% Alert, oriented x 4 Difficulty with swallowing, checked by speech therapy. Diet will be thick nectars Moving lower extremities, did not walk today. Sat up on side of bed, stood with the help of pt. Will give bolus, low urine output.  Consulted rehab for possible inpatient stay

## 2013-12-05 NOTE — Progress Notes (Signed)
Pt c/o difficulty swallowing and sore throat.  Pt noted to be coughing after medication administration with water.  ST consult placed.  Will continue to monitor. Cori Razor, RN 12/05/2013

## 2013-12-05 NOTE — Progress Notes (Signed)
OT NOTE  Patient and family report that prior to surgery patient was informed a brace would be provided. Pt currently without brace. MD please address with patient and order. OT to follow acutely and await update on brace to provide appropriate education.  Patient currently requires total +2 (A) for sit<>stand during session. Pt will require supervision level to d/c home safely. Pt's wife using a scooter to arrive to the hospital.  Jeri Modena   OTR/L Pager: [phone] Office: [phone] .

## 2013-12-05 NOTE — Procedures (Signed)
Objective Swallowing Evaluation: Fiberoptic Endoscopic Evaluation of Swallowing  Patient Details  Name: [name] MRN: [id number] Date of Birth: October 01, 1931  Today's Date: 12/05/2013 Time: 1355-1430 SLP Time Calculation (min) (ACUTE ONLY): 35 min  Past Medical History:  Past Medical History  Diagnosis Date  . Hypercholesteremia   . Parkinson's disease   . Hypertension   . Sleep disorder   . Ex-smoker   . Cancer     h/o  skin  cancer  . Sleep apnea     ?? sleep apnea....tested 2013 @ [facility]     . GERD (gastroesophageal reflux disease)   . Arthritis   . Lumbar stenosis    Past Surgical History:  Past Surgical History  Procedure Laterality Date  . Tonsillectomy Bilateral   . Meniscus repair      right knee  2000  . Eye surgery      bilateral  cataracts  . Appendectomy     HPI:  Pt is an 80 year old male who underwent lumbar decompression and fusion at L4-5 on 12/04/13. He has intubted for the procedure. He has developed difficulty swallowing medications, deosntrated signs of aspriaiton at bedside, FEES recommended for objective evaluation of swallowing.      Assessment / Plan / Recommendation Clinical Impression  Dysphagia Diagnosis: Severe pharyngeal phase dysphagia  Clinical impression: Pt demonstrates a moderate to severe oropharyngeal dysphagia with aspiration and penetration of all liquid textures due to motor and sensory deficits. This is likely an acute worsening of mild baseline Parkinson's related dysphagia due to deconditioning post surgery.   There were ulcerations noted on the pts right arytenoid and near first tracheal ring. The posterior commissure and tissue surrounding the right arytenoid was erythematous and edematous, consistent with pts complaint of throat pain. These impairments contribute to poor airway protection and result in penetration and aspiration during the swallow. Sensory deficits include delayed swallow initiation and  absent sensation of laryngeal penetration and late sensation of aspiration. The pt was observed to have diffuse weakness across oropharyngeal musculature, particularly with base of tongue and hyolaryngeal excursion resulting moderate to severe residuals particularly with nectar and honey thick liquids. A heavier, more cohesive puree bolus is best transited with minimal residuals and no penetration/aspiration. Compensatory strategies were not effective in preventing aspiration with liquids.   Recommend pt initiate a restrictive dys 1 (pudding thick liquid) diet to reduce risk of aspiration during acute recovery following surgery. Anticipate recovery as strength and mobility improve and pts airway heals post procedure.     Treatment Recommendation  Therapy as outlined in treatment plan below    Diet Recommendation Dysphagia 1 (Puree);Pudding-thick liquid   Liquid Administration via: Spoon Medication Administration: Whole meds with puree Supervision: Full supervision/cueing for compensatory strategies;Staff to assist with self feeding Compensations: Multiple dry swallows after each bite/sip Postural Changes and/or Swallow Maneuvers: Seated upright 90 degrees    Other  Recommendations Oral Care Recommendations: Oral care BID Other Recommendations: Order thickener from pharmacy   Follow Up Recommendations  Inpatient Rehab    Frequency and Duration min 3x week  2 weeks   Pertinent Vitals/Pain NA    SLP Swallow Goals     General HPI: Pt is an 80 year old male who underwent lumbar decompression and fusion at L4-5 on 12/04/13. He has intubted for the procedure. He has developed difficulty swallowing medications, deosntrated signs of aspriaiton at bedside, FEES recommended for objective evaluation of swallowing.  Type of Study: Fiberoptic Endoscopic Evaluation of Swallowing  Reason for Referral: Objectively evaluate swallowing function Previous Swallow Assessment: none Diet Prior to this  Study: Regular;Thin liquids Temperature Spikes Noted: No Respiratory Status: Nasal cannula History of Recent Intubation: Yes Length of Intubations (days): 1 days Date extubated: 12/04/13 Behavior/Cognition: Alert;Cooperative;Pleasant mood Oral Cavity - Dentition: Adequate natural dentition Oral Motor / Sensory Function: Within functional limits Self-Feeding Abilities: Able to feed self Patient Positioning: Upright in bed Baseline Vocal Quality: Low vocal intensity;Hoarse Volitional Cough: Strong Volitional Swallow: Able to elicit Anatomy: Other (Comment) (see impression statement)    Reason for Referral Objectively evaluate swallowing function   Oral Phase Oral Preparation/Oral Phase Oral Phase: WFL   Pharyngeal Phase Pharyngeal Phase Pharyngeal Phase: Impaired Pharyngeal - Honey Pharyngeal - Honey Teaspoon: Delayed swallow initiation;Reduced epiglottic inversion;Reduced anterior laryngeal mobility;Reduced laryngeal elevation;Reduced tongue base retraction;Reduced airway/laryngeal closure;Penetration/Aspiration after swallow;Penetration/Aspiration during swallow;Pharyngeal residue - valleculae;Lateral channel residue;Inter-arytenoid space residue;Compensatory strategies attempted (Comment) (chin tuck multiple swallows) Penetration/Aspiration details (honey teaspoon): Material enters airway, CONTACTS cords and not ejected out Pharyngeal - Nectar Pharyngeal - Nectar Teaspoon: Delayed swallow initiation;Reduced epiglottic inversion;Reduced anterior laryngeal mobility;Reduced laryngeal elevation;Reduced tongue base retraction;Reduced airway/laryngeal closure;Penetration/Aspiration after swallow;Penetration/Aspiration during swallow;Pharyngeal residue - valleculae;Lateral channel residue;Inter-arytenoid space residue;Compensatory strategies attempted (Comment) (chin tuck, multiple swallows) Penetration/Aspiration details (nectar teaspoon): Material enters airway, CONTACTS cords and not ejected  out;Material enters airway, remains ABOVE vocal cords and not ejected out Pharyngeal - Nectar Cup: Delayed swallow initiation;Reduced epiglottic inversion;Reduced anterior laryngeal mobility;Reduced laryngeal elevation;Reduced tongue base retraction;Reduced airway/laryngeal closure;Penetration/Aspiration after swallow;Penetration/Aspiration during swallow;Pharyngeal residue - valleculae;Lateral channel residue;Inter-arytenoid space residue;Compensatory strategies attempted (Comment) Penetration/Aspiration details (nectar cup): Material enters airway, CONTACTS cords and not ejected out;Material enters airway, remains ABOVE vocal cords and not ejected out Pharyngeal - Thin Pharyngeal - Thin Cup: Delayed swallow initiation;Reduced epiglottic inversion;Reduced anterior laryngeal mobility;Reduced laryngeal elevation;Reduced tongue base retraction;Reduced airway/laryngeal closure;Penetration/Aspiration after swallow;Penetration/Aspiration during swallow;Pharyngeal residue - valleculae;Lateral channel residue;Inter-arytenoid space residue;Compensatory strategies attempted (Comment);Moderate aspiration Penetration/Aspiration details (thin cup): Material enters airway, passes BELOW cords and not ejected out despite cough attempt by patient Pharyngeal - Solids Pharyngeal - Puree: Delayed swallow initiation;Reduced tongue base retraction;Reduced epiglottic inversion;Reduced anterior laryngeal mobility;Reduced laryngeal elevation;Reduced airway/laryngeal closure;Pharyngeal residue - valleculae  Cervical Esophageal Phase    GO    Cervical Esophageal Phase Cervical Esophageal Phase:  (residue above UES/pyriforms )        [name], MA CCC-SLP [phone]  Bart Ashford, Katherene Ponto 12/05/2013, 3:30 PM

## 2013-12-05 NOTE — Clinical Social Work Placement (Addendum)
[facility] NOTE 12/05/2013  Patient:  [name], [name]  Account Number:  [id number] Admit date:  12/04/2013  Clinical Social Worker:  Glendon Axe, CLINICAL SOCIAL WORKER  Date/time:  12/05/2013 04:03 PM  Clinical Social Work is seeking post-discharge placement for this patient at the following level of care:   [place]   (*CSW will update this form in Epic as items are completed)   12/05/2013  Patient/family provided with [facility] Department of Clinical Social Work's list of facilities offering this level of care within the geographic area requested by the patient (or if unable, by the patient's family).  12/05/2013  Patient/family informed of their freedom to choose among providers that offer the needed level of care, that participate in Medicare, Medicaid or managed care program needed by the patient, have an available bed and are willing to accept the patient.  12/05/2013  Patient/family informed of [facility]' ownership interest in [facility], as well as of the fact that they are under no obligation to receive care at this facility.  PASARR submitted to EDS on 12/06/2013  PASARR number received on 12/06/2013  FL2 transmitted to all facilities in geographic area requested by pt/family on  12/06/2013 FL2 transmitted to all facilities within larger geographic area on 12/06/2013  Patient informed that his/her managed care company has contracts with or will negotiate with  certain facilities, including the following:  YES   Patient/family informed of bed offers received:  12/07/2013 Patient chooses bed at [facility]  Physician recommends and patient chooses bed at    Patient to be transferred to  on   Patient to be transferred to facility by  Patient and family notified of transfer on  Name of family member notified:    The following physician request were entered in Epic:   Additional  Comments:   Glendon Axe, MSW, [place] [phone] 12/05/2013 4:04 PM

## 2013-12-05 NOTE — Evaluation (Signed)
Physical Therapy Evaluation Patient Details Name: [name] MRN: [id number] DOB: Jun 09, 1931 Today's Date: 12/05/2013   History of Present Illness  80 y.o. male admitted for L4-5 PLIF and arthrodesis due tp L4-5 spinal, bilateral lateral recess and foraminal stenosis. PMH significant for Parkinson's (affecting L side predominantly), HTN,  hyperlipidemia, hx of CA, complex sleep apnea on BiPAP ST, and GERD.  Clinical Impression  Pt was seen with family available to ask about home situation and to discuss his need for mobility assistance.  Pt would presently be appropriate for short stay in inpt therapy of SNF to increase standing and gait tolerance.  Decision for fitting back brace has not been done by MD yet, but pt follows body mechanics instructions with verbal and tactile cues.    Follow Up Recommendations SNF;Supervision/Assistance - 24 hour    Equipment Recommendations  Rolling walker with 5" wheels    Recommendations for Other Services       Precautions / Restrictions Precautions Precautions: Back;Fall Precaution Booklet Issued: Yes (comment) Precaution Comments: Reviewed 3/3 back precautions Required Braces or Orthoses: Spinal Brace Spinal Brace: Other (comment) Spinal Brace Comments: Nursing has not heard from doctor regarding need for brace Restrictions Weight Bearing Restrictions: No      Mobility  Bed Mobility Overal bed mobility: Needs Assistance Bed Mobility: Rolling;Sidelying to Sit;Sit to Supine;Sit to Sidelying Rolling: Min assist Sidelying to sit: Max assist   Sit to supine: Max assist Sit to sidelying: Max assist General bed mobility comments: Lowered HOB and cued his sequence with bedrail to get OOB but none to return to bed  Transfers Overall transfer level: Needs assistance Equipment used: Rolling walker (2 wheeled) Transfers: Sit to/from Stand Sit to Stand: Mod assist;From elevated surface (FWW)         General transfer comment: Mod of  1 to power up and then min to maintain standing control  Ambulation/Gait             General Gait Details: sidesteps bedside to transition only  Stairs            Wheelchair Mobility    Modified Rankin (Stroke Patients Only)       Balance Overall balance assessment: Needs assistance Sitting-balance support: Feet supported;Bilateral upper extremity supported Sitting balance-Leahy Scale: Poor   Postural control: Posterior lean Standing balance support: Bilateral upper extremity supported Standing balance-Leahy Scale: Poor Standing balance comment: posterior lean for standign and sitting, difficulty controlling due to back surgery, parkinson's and effort to stand                             Pertinent Vitals/Pain Pain Assessment: 0-10 Pain Score: 6  Pain Location: back Pain Descriptors / Indicators: Aching Pain Intervention(s): Limited activity within patient's tolerance;Monitored during session;RN gave pain meds during session;Repositioned;Relaxation    Home Living Family/patient expects to be discharged to:: Private residence Living Arrangements: Spouse/significant other Available Help at Discharge: Family;Available 24 hours/day Type of Home: House Home Access: Stairs to enter Entrance Stairs-Rails: None Entrance Stairs-Number of Steps: 1 Home Layout: Multi-level;Able to live on main level with bedroom/bathroom Home Equipment: Walker - 4 wheels;Bedside commode;Shower seat - built in;Grab bars - toilet;Other (comment) Additional Comments: Pt on rollator just before surgery but PLOF was no AD    Prior Function Level of Independence: Needs assistance   Gait / Transfers Assistance Needed: rollator prior to surgery           Hand Dominance  Dominant Hand: Right    Extremity/Trunk Assessment   Upper Extremity Assessment: Generalized weakness;LUE deficits/detail       LUE Deficits / Details: Parkinson's tone is more evident on  LUE   Lower Extremity Assessment: Generalized weakness      Cervical / Trunk Assessment: Other exceptions (new lumbar fusion)  Communication   Communication: No difficulties  Cognition Arousal/Alertness: Lethargic Behavior During Therapy: Flat affect Overall Cognitive Status: Difficult to assess                      General Comments General comments (skin integrity, edema, etc.): Wife and daughter with pt, helpful with information and able to discuss SNF plan as well    Exercises General Exercises - Lower Extremity Toe Raises: 15 reps;AROM;Both      Assessment/Plan    PT Assessment Patient needs continued PT services  PT Diagnosis Difficulty walking   PT Problem List Decreased strength;Decreased range of motion;Decreased activity tolerance;Decreased balance;Decreased mobility;Decreased coordination;Decreased knowledge of use of DME;Decreased knowledge of precautions;Decreased skin integrity;Pain  PT Treatment Interventions Gait training;DME instruction;Stair training;Functional mobility training;Therapeutic activities;Therapeutic exercise;Balance training;Neuromuscular re-education;Patient/family education   PT Goals (Current goals can be found in the Care Plan section) Acute Rehab PT Goals Patient Stated Goal: none stated PT Goal Formulation: With patient/family Time For Goal Achievement: 12/19/13 Potential to Achieve Goals: Good    Frequency Min 5X/week   Barriers to discharge Inaccessible home environment step and longer walking to succeed at home    Co-evaluation               End of Session Equipment Utilized During Treatment: Gait belt Activity Tolerance: Patient limited by lethargy;Patient limited by pain Patient left: in bed;with call bell/phone within reach;with nursing/sitter in room;with family/visitor present Nurse Communication: Mobility status         Time: 1125-1150 PT Time Calculation (min) (ACUTE ONLY): 25 min   Charges:   PT  Evaluation $Initial PT Evaluation Tier I: 1 Procedure PT Treatments $Therapeutic Activity: 8-22 mins   PT G Codes:          Ramond Dial Dec 30, 2013, 12:13 PM  Mee Hives, PT MS Acute Rehab Dept. Number: [phone]

## 2013-12-06 NOTE — Plan of Care (Signed)
Problem: Phase I Progression Outcomes Goal: Pain controlled with appropriate interventions Outcome: Completed/Met Date Met:  12/06/13     

## 2013-12-06 NOTE — Plan of Care (Signed)
Problem: Consults Goal: Nutrition Consult-if indicated Outcome: Completed/Met Date Met:  12/06/13

## 2013-12-06 NOTE — Plan of Care (Signed)
Problem: Phase I Progression Outcomes Goal: Pain controlled with appropriate interventions Outcome: Progressing Goal: OOB as tolerated unless otherwise ordered Outcome: Progressing Goal: Log roll for position change Outcome: Completed/Met Date Met:  12/06/13 Goal: Initial discharge plan identified Outcome: Progressing Goal: PT/OT consults requested Outcome: Completed/Met Date Met:  12/06/13 Goal: Hemodynamically stable Outcome: Completed/Met Date Met:  12/06/13 Goal: Other Phase I Outcomes/Goals Outcome: Progressing

## 2013-12-06 NOTE — Progress Notes (Signed)
Speech Language Pathology Treatment: Dysphagia  Patient Details Name: [name] MRN: [id number] DOB: December 22, 1931 Today's Date: 12/06/2013 Time: 3893-7342 SLP Time Calculation (min) (ACUTE ONLY): 13 min  Assessment / Plan / Recommendation Clinical Impression  F/u after yesterday's FEES.  Pt reports dissatisfaction with Dysphagia 1 diet, but understands the rationale and likelihood of this diet being temporary.  Dtr reports better appetite/consumption at lunch.  Pt consumed 4 oz container of ensure pudding with min verbal cues for precautions.  Min cues for effortful swallow; multiple f/u swallows required per each bolus.  Lungs are clear per RN notes. Recommend continued conservative diet - SLP to follow per plan.     HPI HPI: Pt is an 80 year old male who underwent lumbar decompression and fusion at L4-5 on 12/04/13. He has intubted for the procedure. He has developed difficulty swallowing medications, deosntrated signs of aspriaiton at bedside, FEES recommended for objective evaluation of swallowing.    Pertinent Vitals Pain Assessment: 0-10 Pain Score: 1  Pain Location: back Pain Intervention(s): Monitored during session;Repositioned  SLP Plan  Continue with current plan of care    Recommendations Diet recommendations: Dysphagia 1 (puree) (pudding-thick liquids) Medication Administration: Whole meds with puree Supervision: Full supervision/cueing for compensatory strategies;Staff to assist with self feeding Compensations: Multiple dry swallows after each bite/sip Postural Changes and/or Swallow Maneuvers: Seated upright 90 degrees              Oral Care Recommendations: Oral care BID Follow up Recommendations: Inpatient Rehab Plan: Continue with current plan of care   [name] L. Tivis Ringer, [place] CCC/SLP Pager [phone]      Juan Quam [name] 12/06/2013, 3:02 PM

## 2013-12-06 NOTE — Progress Notes (Signed)
Patient ID: [name], male   DOB: [birth year], 80 y.o.   MRN: [id number] Subjective:  The patient is alert and pleasant. He still has dysphagia. He does not like the pured diet. I spoke with his family.  Objective: Vital signs in last 24 hours: Temp:  [97.7 F (36.5 C)-99.5 F (37.5 C)] 99.5 F (37.5 C) (11/21 1001) Pulse Rate:  [86-97] 97 (11/21 1001) Resp:  [16-18] 18 (11/21 1001) BP: (93-142)/(41-66) 129/66 mmHg (11/21 1001) SpO2:  [90 %-97 %] 97 % (11/21 1001)  Intake/Output from previous day:   Intake/Output this shift: Total I/O In: 100 [P.O.:100] Out: -   Physical exam the patient is alert and oriented. He is moving all 4 extremities well.  Lab Results: No results for input(s): WBC, HGB, HCT, PLT in the last 72 hours. BMET No results for input(s): NA, K, CL, CO2, GLUCOSE, BUN, CREATININE, CALCIUM in the last 72 hours.  Studies/Results: Dg Lumbar Spine 2-3 Views  12/04/2013   CLINICAL DATA:  L4-5 PLIF  EXAM: DG C-ARM 61-120 MIN; LUMBAR SPINE - 2-3 VIEW  COMPARISON:  11/06/2013  FLUOROSCOPY TIME:  58 seconds  FINDINGS: Two spot films were obtained. Screws are noted traversing the pedicles bilaterally at L4 and L5. The numbering nomenclature similar to that used on prior MRI.  IMPRESSION: Intraoperative localization at L4-5   Electronically Signed   By: Inez Catalina M.D.   On: 12/04/2013 18:22   Dg C-arm 1-60 Min  12/04/2013   CLINICAL DATA:  L4-5 PLIF  EXAM: DG C-ARM 61-120 MIN; LUMBAR SPINE - 2-3 VIEW  COMPARISON:  11/06/2013  FLUOROSCOPY TIME:  58 seconds  FINDINGS: Two spot films were obtained. Screws are noted traversing the pedicles bilaterally at L4 and L5. The numbering nomenclature similar to that used on prior MRI.  IMPRESSION: Intraoperative localization at L4-5   Electronically Signed   By: Inez Catalina M.D.   On: 12/04/2013 18:22    Assessment/Plan: Postop day #2: We will continue to mobilize the patient.  LOS: 2 days     Milton Streicher  D 12/06/2013, 10:35 AM

## 2013-12-06 NOTE — Progress Notes (Signed)
Physical Therapy Treatment Patient Details Name: [name] MRN: [id number] DOB: Sep 29, 1931 Today's Date: 12/06/2013    History of Present Illness 80 y.o. male admitted for L4-5 PLIF and arthrodesis due tp L4-5 spinal, bilateral lateral recess and foraminal stenosis. PMH significant for Parkinson's (affecting L side predominantly), HTN,  hyperlipidemia, hx of CA, complex sleep apnea on BiPAP ST, and GERD.    PT Comments    Pt mobilizing better today despite delay in Parkinson's medications (per family). Heavy mod assist to come to sit at EOB and noted wife uses a scooter herself and can only provide supervision. Continue to recommend SNF.  Follow Up Recommendations  SNF;Supervision/Assistance - 24 hour     Equipment Recommendations  Rolling walker with 5" wheels    Recommendations for Other Services       Precautions / Restrictions Precautions Precautions: Back;Fall Precaution Comments: Reviewed 3/3 back precautions; pt could recall 1/3 Restrictions Weight Bearing Restrictions: No    Mobility  Bed Mobility Overal bed mobility: Needs Assistance Bed Mobility: Rolling;Sidelying to Sit Rolling: Min assist Sidelying to sit: Mod assist       General bed mobility comments: HOB 0 with rail; vc for step-by-step sequence to maintain back precautions  Transfers Overall transfer level: Needs assistance Equipment used: Rolling walker (2 wheeled) Transfers: Sit to/from Stand Sit to Stand: Min assist         General transfer comment: incr time to scoot to EOB prior to initiating sit to stand; help to place Lt foot under knee (leg tending to externally rotate and abdct)  Ambulation/Gait Ambulation/Gait assistance: Min assist;+2 safety/equipment Ambulation Distance (Feet): 150 Feet Assistive device: Rolling walker (2 wheeled) Gait Pattern/deviations: Step-through pattern;Decreased stride length;Shuffle;Festinating     General Gait Details: required assist to  maneuver/turn RW; loss of balance anteriorly when stopped to check SaO2    Stairs            Wheelchair Mobility    Modified Rankin (Stroke Patients Only)       Balance                                    Cognition Arousal/Alertness: Lethargic (initially) Behavior During Therapy: Flat affect Overall Cognitive Status: Within Functional Limits for tasks assessed                      Exercises General Exercises - Lower Extremity Ankle Circles/Pumps: AROM;Both;10 reps Quad Sets: AROM;Both;10 reps Heel Slides: AAROM;Both;Other reps (comment)    General Comments General comments (skin integrity, edema, etc.): Wife and daughter present. Report his Parkinson's medication schedule has been thrown comopletely off.       Pertinent Vitals/Pain Pain Assessment: 0-10 Pain Score: 1  Pain Location: back Pain Intervention(s): Monitored during session;Repositioned    Home Living                      Prior Function            PT Goals (current goals can now be found in the care plan section) Acute Rehab PT Goals Patient Stated Goal: to improve walking Progress towards PT goals: Progressing toward goals    Frequency  Min 5X/week    PT Plan Current plan remains appropriate    Co-evaluation             End of Session Equipment Utilized During Treatment: Gait belt;Oxygen Activity Tolerance:  Patient tolerated treatment well Patient left: with call bell/phone within reach;with family/visitor present;in chair;with chair alarm set     Time: 5072-2575 PT Time Calculation (min) (ACUTE ONLY): 25 min  Charges:  $Gait Training: 23-37 mins                    G Codes:      Reynaldo Rossman 2013/12/24, 1:55 PM Pager [phone]

## 2013-12-06 NOTE — Plan of Care (Signed)
Problem: Phase I Progression Outcomes Goal: OOB as tolerated unless otherwise ordered Outcome: Completed/Met Date Met:  12/06/13     

## 2013-12-07 NOTE — Progress Notes (Signed)
Pt ambulated 100 feet in hall. Standby assistance with walker.

## 2013-12-07 NOTE — Progress Notes (Addendum)
Occupational Therapy Treatment Patient Details Name: [name] MRN: [id number] DOB: [birth year] Today's Date: 12/07/2013    History of present illness 80 y.o. male admitted for L4-5 PLIF and arthrodesis due tp L4-5 spinal, bilateral lateral recess and foraminal stenosis. PMH significant for Parkinson's (affecting L side predominantly), HTN,  hyperlipidemia, hx of CA, complex sleep apnea on BiPAP ST, and GERD.   OT comments  Pt progressing. Education provided during session. Recommending SNF for rehab unless pt progresses to supervision level prior to d/c.   Follow Up Recommendations  SNF;Supervision/Assistance - 24 hour    Equipment Recommendations  Other (comment) (RW)    Recommendations for Other Services Speech consult    Precautions / Restrictions Precautions Precautions: Back;Fall Precaution Comments: Reviewed back precautions. pt able to state 2/3. Restrictions Weight Bearing Restrictions: No       Mobility Bed Mobility Overal bed mobility: Needs Assistance Bed Mobility: Rolling;Sidelying to Sit;Sit to Sidelying Rolling: Mod assist;Supervision;Min assist (Min A when rolling onto back) Sidelying to sit: Min guard     Sit to sidelying: Mod assist General bed mobility comments: Initially tried without rail and pt Mod A to roll. OT lifted rail back up and pt finished rolling onto side with supervision. Educated on log roll technique. Assist with LE's when going to sidelying and wife assisted minimally.  Transfers Overall transfer level: Needs assistance Equipment used: Rolling walker (2 wheeled) Transfers: Sit to/from Stand Sit to Stand: Mod assist;Min guard         General transfer comment: Mod A for sit to stand from bed. Min guard for sit to stand from chair and commode with grab bar. Cues for technique.    Balance                                   ADL Overall ADL's : Needs assistance/impaired     Grooming: Wash/dry face;Wash/dry  hands;Min guard;Standing;Oral care;Applying deodorant           Upper Body Dressing :  (OT assisted with gown to allow him to put on deodorant)   Lower Body Dressing: Moderate assistance;Sit to/from stand;With adaptive equipment (donned shorts)   Toilet Transfer: Ambulation;Minimal assistance;RW;Comfort height toilet;Grab bars   Toileting- Clothing Manipulation and Hygiene: Minimal assistance (assisted with gown prior to sitting down; simulated toilet hygiene of wiping buttocks while sitting in chair but not enough room)       Functional mobility during ADLs: Rolling walker;Min guard;Minimal assistance General ADL Comments: Pt ambulated to bathroom and performed grooming tasks at sink. educated on use of cup for oral care and placement of grooming items to avoid breaking precautions. Educated on AE for LB ADLs and where to purchase and cost. Pt practiced donning shorts. Educated on what pt could use for toilet aide for hygiene (pt tried to simulate leaning in chair but not enouch room in chair).       Vision                     Perception     Praxis      Cognition  Awake/Alert Behavior During Therapy: Flat affect Overall Cognitive Status: Within Functional Limits for tasks assessed       Memory: Decreased short-term memory;Decreased recall of precautions               Extremity/Trunk Assessment  Exercises     Shoulder Instructions       General Comments      Pertinent Vitals/ Pain       Pain Assessment: 0-10 Pain Score: 4  Pain Location: back Pain Intervention(s): Repositioned;Monitored during session   O2 at 90-91% in session on RA. Educated on deep breathing technique. O2 placed back on at end of session.  Home Living                                          Prior Functioning/Environment              Frequency Min 2X/week     Progress Toward Goals  OT Goals(current goals can now be found in the  care plan section)  Progress towards OT goals: Progressing toward goals  Acute Rehab OT Goals Patient Stated Goal: not stated OT Goal Formulation: With patient Time For Goal Achievement: 12/19/13 Potential to Achieve Goals: Good ADL Goals Pt Will Perform Lower Body Bathing: with adaptive equipment;sit to/from stand;with min guard assist Pt Will Perform Lower Body Dressing: with min guard assist;sit to/from stand;with adaptive equipment Pt Will Transfer to Toilet: with supervision;ambulating;bedside commode;grab bars Pt Will Perform Toileting - Clothing Manipulation and hygiene: with supervision;sit to/from stand Additional ADL Goal #1: Pt will verbalize and demonstrate 3/3 back precautions with 100% accuracy prior to discharge. Additional ADL Goal #2: Pt will don/doff back brace independently while seated EOB.  Plan Discharge plan needs to be updated    Co-evaluation                 End of Session Equipment Utilized During Treatment: Gait belt;Rolling walker   Activity Tolerance Patient tolerated treatment well   Patient Left in bed;with call bell/phone within reach;with family/visitor present   Nurse Communication          Time: 6067-7034 OT Time Calculation (min): 35 min  Charges: OT General Charges $OT Visit: 1 Procedure OT Treatments $Self Care/Home Management : 23-37 mins  Benito Mccreedy OTR/L [phone] 12/07/2013, 4:17 PM

## 2013-12-07 NOTE — Plan of Care (Signed)
Problem: Phase I Progression Outcomes Goal: Initial discharge plan identified Outcome: Completed/Met Date Met:  12/07/13 Goal: Other Phase I Outcomes/Goals Outcome: Completed/Met Date Met:  12/07/13  Problem: Phase II Progression Outcomes Goal: Progress activity as tolerated unless otherwise ordered Outcome: Progressing Goal: Discharge plan established Outcome: Progressing Goal: Tolerating diet Outcome: Completed/Met Date Met:  12/07/13 Goal: Verbalizes of donning/doffing brace Outcome: Not Applicable Date Met:  49/20/10 Goal: Understands assist devices with ambulation Outcome: Progressing

## 2013-12-07 NOTE — Progress Notes (Signed)
Physical Therapy Treatment Patient Details Name: [name] MRN: [id number] DOB: 1931-05-12 Today's Date: 12/07/2013    History of Present Illness      PT Comments    Pt progressing well.  Ambulated without O2 this session.  Pt returned to 2 L O2 via [place] upon return to room.    Follow Up Recommendations  SNF;Supervision/Assistance - 24 hour     Equipment Recommendations  Rolling walker with 5" wheels    Recommendations for Other Services       Precautions / Restrictions Precautions Precautions: Back;Fall Precaution Comments: Reviewed 3/3 back precautions. Restrictions Weight Bearing Restrictions: No    Mobility  Bed Mobility     Rolling: Min assist Sidelying to sit: HOB elevated;Mod assist     Sit to sidelying: Mod assist;HOB elevated General bed mobility comments:  vc for step-by-step sequence to maintain back precautions  Transfers   Equipment used: Rolling walker (2 wheeled)   Sit to Stand: Min assist            Ambulation/Gait Ambulation/Gait assistance: Min guard Ambulation Distance (Feet): 200 Feet Assistive device: Rolling walker (2 wheeled) Gait Pattern/deviations: Step-through pattern;Decreased stride length Gait velocity: WFL       Stairs            Wheelchair Mobility    Modified Rankin (Stroke Patients Only)       Balance                                    Cognition Arousal/Alertness: Awake/alert Behavior During Therapy: Flat affect Overall Cognitive Status: Within Functional Limits for tasks assessed                      Exercises      General Comments        Pertinent Vitals/Pain Pain Assessment: 0-10 Pain Score: 5  Pain Location: back Pain Intervention(s): Patient requesting pain meds-RN notified;Monitored during session    Home Living                      Prior Function            PT Goals (current goals can now be found in the care plan section) Progress  towards PT goals: Progressing toward goals    Frequency  Min 5X/week    PT Plan Current plan remains appropriate    Co-evaluation             End of Session Equipment Utilized During Treatment: Gait belt Activity Tolerance: Patient tolerated treatment well Patient left: with call bell/phone within reach;in bed;with family/visitor present;with nursing/sitter in room     Time: 8786-7672 PT Time Calculation (min) (ACUTE ONLY): 24 min  Charges:  $Gait Training: 23-37 mins                    G Codes:      Lorriane Shire 12/07/2013, 11:32 AM

## 2013-12-07 NOTE — Progress Notes (Signed)
Patient ID: [name], male   DOB: December 08, 1931, 80 y.o.   MRN: [id number] Subjective:  The patient is alert and pleasant. He says he is swallowing better. He would like to progress his diet.  Objective: Vital signs in last 24 hours: Temp:  [97.5 F (36.4 C)-98.6 F (37 C)] 98.6 F (37 C) (11/22 0936) Pulse Rate:  [82-97] 82 (11/22 0936) Resp:  [16-22] 20 (11/22 0936) BP: (102-144)/(47-70) 115/58 mmHg (11/22 0936) SpO2:  [92 %-96 %] 96 % (11/22 0936)  Intake/Output from previous day: 11/21 0701 - 11/22 0700 In: 220 [P.O.:220] Out: 100 [Urine:100] Intake/Output this shift: Total I/O In: -  Out: 200 [Urine:200]  Physical exam the patient is alert and oriented. He is moving all 4 extremities well. He has a tremor.  Lab Results: No results for input(s): WBC, HGB, HCT, PLT in the last 72 hours. BMET No results for input(s): NA, K, CL, CO2, GLUCOSE, BUN, CREATININE, CALCIUM in the last 72 hours.  Studies/Results: No results found.  Assessment/Plan: Postop day #3: I will ask speech therapy to do do a swallowing eval. Hopefully we can progress his diet.  LOS: 3 days     [name] D 12/07/2013, 10:20 AM

## 2013-12-07 NOTE — Plan of Care (Signed)
Problem: Acute Rehab OT Goals (only OT should resolve) Goal: OT Additional ADL Goal #2 Outcome: Not Applicable Date Met:  72/53/66

## 2013-12-08 DIAGNOSIS — M4806 Spinal stenosis, lumbar region: Secondary | ICD-10-CM

## 2013-12-08 MED ORDER — ADULT MULTIVITAMIN W/MINERALS CH
1.0000 | ORAL_TABLET | Freq: Every day | ORAL | Status: DC
  Administered 2013-12-08 – 2013-12-10 (×3): 1 via ORAL
  Filled 2013-12-08 (×3): qty 1

## 2013-12-08 NOTE — Progress Notes (Signed)
Chaplain initiaed visit with pt and family. Pt receiving support from family and church community. Chaplain offered prayer with pt and family. Chaplain made pt and family aware of her services should they need them. Page chaplain as needed.    12/08/13 0900  Clinical Encounter Type  Visited With Patient and family together  Visit Type Initial;Spiritual support  Spiritual Encounters  Spiritual Needs Prayer;Emotional  Stress Factors  Patient Stress Factors Health changes  Family Stress Factors Health changes  Nnaemeka Samson, Epifanio Lesches 12/08/2013 9:48 AM

## 2013-12-08 NOTE — Progress Notes (Signed)
Speech Language Pathology Treatment: Dysphagia  Patient Details Name: [name] MRN: [id number] DOB: May 29, 1931 Today's Date: 12/08/2013 Time: 2951-8841 SLP Time Calculation (min) (ACUTE ONLY): 18 min  Assessment / Plan / Recommendation Clinical Impression  Subjectively pt demonstrates excellent improvement since Friday. Pt stronger and more alert, reports throat pain has improved. SLP provided trials of 4 oz of nectar and then 8 oz of thin liquids, without any cough and no wet vocal quality observed over session (in great contrast to bedside observations on Friday). Swallow is still audible, indicative of slight delay. Likely ulcerations are healing and airway protection has improved, together with improved arousal and strength. Suspect pt back to baseline function. Recommend upgrade to regular texture diet with thin liquids with basic aspiration precautions, discussed with pt, family and RN. SLP will f/u tomorrow for check of tolerance. Please contact SLP with any concerns. [name], [place] CCC-SLP [phone]    HPI HPI: Pt is an 80 year old male who underwent lumbar decompression and fusion at L4-5 on 12/04/13. He has intubted for the procedure. He has developed difficulty swallowing medications, deosntrated signs of aspriaiton at bedside, FEES recommended for objective evaluation of swallowing.    Pertinent Vitals Pain Assessment: 0-10 Pain Score: 5  Pain Location: back Pain Intervention(s): Limited activity within patient's tolerance  SLP Plan  Continue with current plan of care    Recommendations Diet recommendations: Regular;Thin liquid Liquids provided via: Cup Medication Administration: Whole meds with liquid Supervision: Full supervision/cueing for compensatory strategies;Staff to assist with self feeding Compensations: Multiple dry swallows after each bite/sip Postural Changes and/or Swallow Maneuvers: Seated upright 90 degrees              Oral Care Recommendations:  Oral care BID Follow up Recommendations: Inpatient Rehab Plan: Continue with current plan of care    GO    [facility], [place] CCC-SLP [phone]  [name] 12/08/2013, 9:24 AM

## 2013-12-08 NOTE — Plan of Care (Signed)
Problem: Phase III Progression Outcomes Goal: Pain controlled on oral analgesia Outcome: Completed/Met Date Met:  12/08/13     

## 2013-12-08 NOTE — Progress Notes (Addendum)
Clinical Social Worker met with pt and pt's family at bedside to present bed offers. Pt and pt's family chooses [facility]. CSW contacted facility to confirm bed. Pt discussed in progression this morning for possible dc.   FL-2 on chart for MD signature.   CSW to follow pt and pt's family for support and to facilitate pt's discharge needs once medically stable.   Glendon Axe, MSW, LCSWA [phone] 12/08/2013 10:24 AM

## 2013-12-08 NOTE — Plan of Care (Signed)
Problem: Phase III Progression Outcomes Goal: Other Phase III Outcomes/Goals Outcome: Completed/Met Date Met:  12/08/13

## 2013-12-08 NOTE — Plan of Care (Signed)
Problem: Phase III Progression Outcomes Goal: Demonstrates donning/doffing brace Outcome: Not Applicable Date Met:  17/83/75

## 2013-12-08 NOTE — Plan of Care (Signed)
Problem: Phase III Progression Outcomes Goal: Demonstrates proper use of assistive devices Outcome: Completed/Met Date Met:  12/08/13

## 2013-12-08 NOTE — Plan of Care (Signed)
Problem: Discharge Progression Outcomes Goal: Complications resolved/controlled Outcome: Completed/Met Date Met:  12/08/13     

## 2013-12-08 NOTE — Plan of Care (Signed)
Problem: Phase II Progression Outcomes Goal: Discharge plan established Outcome: Completed/Met Date Met:  12/08/13     

## 2013-12-08 NOTE — Plan of Care (Signed)
Problem: Discharge Progression Outcomes Goal: Tolerates diet Outcome: Completed/Met Date Met:  12/08/13 Tolerated Regular diet today

## 2013-12-08 NOTE — Plan of Care (Signed)
Problem: Discharge Progression Outcomes Goal: Hemodynamically stable Outcome: Not Applicable Date Met:  12/08/13     

## 2013-12-08 NOTE — Progress Notes (Signed)
Physical Therapy Treatment Patient Details Name: [name] MRN: [id number] DOB: November 23, 1931 Today's Date: 12/08/2013    History of Present Illness 80 y.o. male admitted for L4-5 PLIF and arthrodesis due tp L4-5 spinal, bilateral lateral recess and foraminal stenosis. PMH significant for Parkinson's (affecting L side predominantly), HTN,  hyperlipidemia, hx of CA, complex sleep apnea on BiPAP ST, and GERD.    PT Comments    Patient is progressing with ambulation this session. Still needing some assistance from transfers and bed mobility. Continue to recommend SNF for ongoing therapy prior to returning home.   Follow Up Recommendations  SNF;Supervision/Assistance - 24 hour     Equipment Recommendations  Rolling walker with 5" wheels    Recommendations for Other Services       Precautions / Restrictions Precautions Precautions: Back;Fall Precaution Comments: Patient able to recall all precautions this session Restrictions Weight Bearing Restrictions: No    Mobility  Bed Mobility Overal bed mobility: Needs Assistance   Rolling: Supervision Sidelying to sit: Min guard     Sit to sidelying: Min assist General bed mobility comments: Patient with use of rails this AM to ease and complete more independently. Patient did require A for LEs back into bed  Transfers Overall transfer level: Needs assistance Equipment used: Rolling walker (2 wheeled)   Sit to Stand: Min assist         General transfer comment: Min A to power fully upright and to ensure anterior weight shift over BOS. Cues for hand placement  Ambulation/Gait Ambulation/Gait assistance: Min guard Ambulation Distance (Feet): 300 Feet Assistive device: Rolling walker (2 wheeled) Gait Pattern/deviations: Step-through pattern;Decreased stride length Gait velocity: WFL   General Gait Details: required assist to maneuver/turn RW; Cues for upright posture and to stand within [employer] Mobility    Modified Rankin (Stroke Patients Only)       Balance                                    Cognition Arousal/Alertness: Awake/alert Behavior During Therapy: WFL for tasks assessed/performed Overall Cognitive Status: Within Functional Limits for tasks assessed                      Exercises      General Comments        Pertinent Vitals/Pain Pain Assessment: 0-10 Pain Score: 5  Pain Location: back Pain Descriptors / Indicators: Sore Pain Intervention(s): Monitored during session    Home Living                      Prior Function            PT Goals (current goals can now be found in the care plan section) Progress towards PT goals: Progressing toward goals    Frequency  Min 5X/week    PT Plan Current plan remains appropriate    Co-evaluation             End of Session Equipment Utilized During Treatment: Gait belt Activity Tolerance: Patient tolerated treatment well Patient left: with call bell/phone within reach;in bed     Time: 0801-0818 PT Time Calculation (min) (ACUTE ONLY): 17 min  Charges:  $Gait Training: 8-22 mins  G Codes:      Jacqualyn Posey 12/08/2013, 9:31 AM 12/08/2013 Jacqualyn Posey PTA [phone] pager [phone] office

## 2013-12-08 NOTE — Progress Notes (Signed)
Noted family prefers SNF close to home. We will sign off. [phone]

## 2013-12-08 NOTE — Plan of Care (Signed)
Problem: Phase II Progression Outcomes Goal: Progress activity as tolerated unless otherwise ordered Outcome: Completed/Met Date Met:  12/08/13 Goal: Discharge plan established Outcome: Progressing Goal: Understands assist devices with ambulation Outcome: Completed/Met Date Met:  12/08/13 Goal: PT/OT consults completed Outcome: Progressing Goal: Other Phase II Outcomes/Goals Outcome: Progressing

## 2013-12-08 NOTE — Progress Notes (Signed)
Patient ID: [name], male   DOB: 1931/06/11, 80 y.o.   MRN: [id number] BP 124/57 mmHg  Pulse 90  Temp(Src) 98 F (36.7 C) (Oral)  Resp 20  Ht 5\' 11"  (1.803 m)  Wt 94.711 kg (208 lb 12.8 oz)  BMI 29.13 kg/m2  SpO2 94% Alert, oriented x 4, speech is clear Moving all extremities Eating a regular diet Family not sure what their options are for discharge. As of this afternoon, they will prefer a skilled nursing facility.

## 2013-12-08 NOTE — Progress Notes (Signed)
Placed pt on cpap, pt using his mask from home.. o2 bled in.. No issues to report

## 2013-12-08 NOTE — Plan of Care (Signed)
Problem: Phase III Progression Outcomes Goal: Discharge plan remains appropriate-arrangements made Outcome: Completed/Met Date Met:  12/08/13

## 2013-12-08 NOTE — Consult Note (Signed)
Physical Medicine and Rehabilitation Consult Reason for Consult: Lumbar stenosis with radiculopathy/history of Parkinson's disease Referring Physician: Dr. Cyndy Freeze   HPI: [name] is a 80 y.o. right handed male with history of Parkinson's disease diagnosed 2 years ago. Patient used a walker prior to admission due to back pain living with his wife who also has physical limitations.. Presented 12/04/2013 with increased low back pain and limiting mobility. X-rays and imaging revealed spondylolisthesis, lumbar stenosis with radiculopathy lumbar L4-5. Underwent L4-5 decompression and posterior lumbar interbody fusion 12/04/2013 per Dr. Cyndy Freeze. Hospital course pain management. Patient remains on Sinemet as well as Mirapex as prior to admission for history of Parkinson's disease. Physical occupational therapy evaluations completed and ongoing. M.D. has requested physical medicine rehabilitation consult.   Review of Systems  Gastrointestinal: Positive for constipation.       GERD  Musculoskeletal: Positive for myalgias and joint pain.  Neurological:       Tremors  All other systems reviewed and are negative.  Past Medical History  Diagnosis Date  . Hypercholesteremia   . Parkinson's disease   . Hypertension   . Sleep disorder   . Ex-smoker   . Cancer     h/o  skin  cancer  . Sleep apnea     ?? sleep apnea....tested 2013 @ [facility]     . GERD (gastroesophageal reflux disease)   . Arthritis   . Lumbar stenosis    Past Surgical History  Procedure Laterality Date  . Tonsillectomy Bilateral   . Meniscus repair      right knee  2000  . Eye surgery      bilateral  cataracts  . Appendectomy     Family History  Problem Relation Age of Onset  . Dementia Mother   . Cancer Brother   . Stroke Brother    Social History:  reports that he quit smoking about 24 years ago. His smoking use included Cigarettes and Pipe. He started smoking about 62 years ago. He smoked  1.00 pack per day. He has never used smokeless tobacco. He reports that he does not drink alcohol or use illicit drugs. Allergies: No Known Allergies Medications Prior to Admission  Medication Sig Dispense Refill  . amLODipine (NORVASC) 10 MG tablet Take 10 mg by mouth daily.    . calcium gluconate 500 MG tablet Take 500 mg by mouth daily.    . carbidopa-levodopa (SINEMET IR) 25-100 MG per tablet Take 1 tablet by mouth 3  times daily 270 tablet 3  . Docusate Calcium (STOOL SOFTENER PO) Take by mouth 2 (two) times daily.    Marland Kitchen glucosamine-chondroitin 500-400 MG tablet Take 1 tablet by mouth once.    Marland Kitchen ketoconazole (NIZORAL) 2 % cream Apply 1 application topically daily as needed for irritation.     . Multiple Vitamin (MULTIVITAMIN) tablet Take 1 tablet by mouth daily.    Marland Kitchen omeprazole (PRILOSEC OTC) 20 MG tablet Take 20 mg by mouth daily.    . pramipexole (MIRAPEX) 0.5 MG tablet Take 1 tablet (0.5 mg total) by mouth 3 (three) times daily. 270 tablet 3  . telmisartan-hydrochlorothiazide (MICARDIS HCT) 80-25 MG per tablet Take 1 tablet by mouth daily.    . vitamin C (ASCORBIC ACID) 500 MG tablet Take 500 mg by mouth daily.      Home: Home Living Family/patient expects to be discharged to:: Private residence Living Arrangements: Spouse/significant other Available Help at Discharge: Family, Available 24 hours/day Type of Home:  House Home Access: Stairs to enter Technical brewer of Steps: 1 Entrance Stairs-Rails: None Home Layout: Multi-level, Able to live on main level with bedroom/bathroom Alternate Level Stairs-Number of Steps: Flight Home Equipment: Environmental consultant - 4 wheels, Bedside commode, Shower seat - built in, [employer] - toilet, Other (comment) Additional Comments: Pt on rollator just before surgery but PLOF was no AD  Functional History: Prior Function Level of Independence: Needs assistance Gait / Transfers Assistance Needed: rollator prior to surgery Communication / Swallowing  Assistance Needed: no issues Functional Status:  Mobility: Bed Mobility Overal bed mobility: Needs Assistance Bed Mobility: Rolling, Sidelying to Sit, Sit to Sidelying Rolling: Mod assist, Supervision, Min assist (Min A when rolling onto back) Sidelying to sit: Min guard Sit to supine: Max assist Sit to sidelying: Mod assist General bed mobility comments: Initially tried without rail and pt Mod A to roll. OT lifted rail back up and pt finished rolling onto side with supervision. Educated on log roll technique. Assist with LE's when going to sidelying and wife assisted minimally. Transfers Overall transfer level: Needs assistance Equipment used: Rolling walker (2 wheeled) Transfers: Sit to/from Stand Sit to Stand: Mod assist, Min guard General transfer comment: Mod A for sit to stand from bed. Min guard for sit to stand from chair and commode with grab bar. Cues for technique. Ambulation/Gait Ambulation/Gait assistance: Min guard Ambulation Distance (Feet): 200 Feet Assistive device: Rolling walker (2 wheeled) Gait Pattern/deviations: Step-through pattern, Decreased stride length Gait velocity: WFL General Gait Details: required assist to maneuver/turn RW; loss of balance anteriorly when stopped to check SaO2     ADL: ADL Overall ADL's : Needs assistance/impaired Grooming: Wash/dry face, Wash/dry hands, Min guard, Standing, Oral care, Applying deodorant Upper Body Dressing :  (OT assisted with gown to allow him to put on deodorant) Lower Body Dressing: Moderate assistance, Sit to/from stand, With adaptive equipment (donned shorts) Toilet Transfer: Ambulation, Minimal assistance, RW, Comfort height toilet, Grab bars Toilet Transfer Details (indicate cue type and reason): Min (A) to maintain balance.  Toileting- Clothing Manipulation and Hygiene: Minimal assistance (assisted with gown prior to sitting down; simulated   Functional mobility during ADLs: Rolling walker, Min guard,  Minimal assistance General ADL Comments: Pt ambulated to bathroom and performed grooming tasks at sink. educated on use of cup for oral care and placement of grooming items to avoid breaking precautions. Educated on AE for LB ADLs and where to purchase and cost. Pt practiced donning shorts. Educated on what pt could use for toilet aide for hygiene (pt tried to simulate leaning in chair but not enouch room in chair).   Cognition: Cognition Overall Cognitive Status: Within Functional Limits for tasks assessed Orientation Level: Oriented X4 Cognition Arousal/Alertness: Awake/alert Behavior During Therapy: Flat affect Overall Cognitive Status: Within Functional Limits for tasks assessed Memory: Decreased short-term memory, Decreased recall of precautions  Blood pressure 149/61, pulse 89, temperature 98 F (36.7 C), temperature source Oral, resp. rate 20, height 5\' 11"  (1.803 m), weight 94.711 kg (208 lb 12.8 oz), SpO2 94 %. Physical Exam  Constitutional: He is oriented to person, place, and time. He appears well-developed.  HENT:  Head: Normocephalic.  Eyes: EOM are normal.  Neck: Normal range of motion. Neck supple. No thyromegaly present.  Cardiovascular: Normal rate and regular rhythm.   Respiratory: Effort normal and breath sounds normal. No respiratory distress.  GI: Soft. Bowel sounds are normal. He exhibits no distension.  Neurological: He is oriented to person, place, and time.  Patient  with flat affect. He follows simple commands. Left side resting tremor  Skin:  Back incision is dressed.    No results found for this or any previous visit (from the past 24 hour(s)). No results found.  Assessment/Plan: Diagnosis: lumbar stenosis s/p decompression/fusion 1. Does the need for close, 24 hr/day medical supervision in concert with the patient's rehab needs make it unreasonable for this patient to be served in a less intensive setting? Potentially 2. Co-Morbidities requiring  supervision/potential complications: PD 3. Due to bladder management, bowel management, safety, disease management and pain management, does the patient require 24 hr/day rehab nursing? Potentially 4. Does the patient require coordinated care of a physician, rehab nurse, PT, OT to address physical and functional deficits in the context of the above medical diagnosis(es)? Potentially Addressing deficits in the following areas: balance, endurance, locomotion, strength, transferring, bowel/bladder control, dressing and feeding 5. Can the patient actively participate in an intensive therapy program of at least 3 hrs of therapy per day at least 5 days per week? Potentially 6. The potential for patient to make measurable gains while on inpatient rehab is fair 7. Anticipated functional outcomes upon discharge from inpatient rehab are n/a  with PT, n/a with OT, n/a with SLP. 8. Estimated rehab length of stay to reach the above functional goals is: n/a 9. Does the patient have adequate social supports and living environment to accommodate these discharge functional goals? N/A 10. Anticipated D/C setting: Other 11. Anticipated post D/C treatments: [place] therapy 12. Overall Rehab/Functional Prognosis: good  RECOMMENDATIONS: This patient's condition is appropriate for continued rehabilitative care in the following setting: SNF and [place] [facility] Patient has agreed to participate in recommended program. Yes Note that insurance prior authorization may be required for reimbursement for recommended care.  Comment: Spoke to patient and wife at length. Wife stated that if she can't bring him home, then she prefers a SNF closer to her home ([facility]). She is interested in working on transfers from the bed while here and in getting a hospital bed when he returns home if they decide they're going directly home.   Meredith Staggers, MD, [facility] 12/08/2013     12/08/2013

## 2013-12-08 NOTE — Plan of Care (Signed)
Problem: Discharge Progression Outcomes Goal: Pain controlled with appropriate interventions Outcome: Completed/Met Date Met:  12/08/13     

## 2013-12-09 MED ORDER — OXYCODONE-ACETAMINOPHEN 5-325 MG PO TABS: 1.0000 | ORAL_TABLET | Freq: Four times a day (QID) | ORAL | Status: DC | PRN

## 2013-12-09 MED ORDER — CYCLOBENZAPRINE HCL 10 MG PO TABS: 10.0000 mg | ORAL_TABLET | Freq: Three times a day (TID) | ORAL | Status: DC | PRN

## 2013-12-09 NOTE — Discharge Summary (Signed)
Physician Discharge Summary  Patient ID: [name] MRN: [id number] DOB/AGE: 08-19-1931 80 y.o.  Admit date: 12/04/2013 Discharge date: 12/09/2013  Admission Diagnoses:Spondylolisthesis, lumbar spondylosis, lumbar stenosis, lumbar radiculopathy and lateral recess stenosis L4/5  Parkinson's disease Discharge Diagnoses:  Active Problems:   Lumbar canal stenosis   Discharged Condition: good  Hospital Course: Mr. Roots was admitted and taken to the operating room for an uncomplicated lumbar fusion. Post op he had some mild difficulty swallowing and was placed on a thick puree diet. He quickly advanced to a regular diet, which is what he is eating at discharge. His wound is clean, dry, and without signs of infection. He is moving all extremities at baseline, all movements obviously influenced by his Parkinson's disease. He is voiding.   Treatments: surgery: Lumbar four-five posterior lumbar interbody fusion with interbody peek prostheses, 23mmx 92mmx 4 degrees, local autograft L4, L5 decompression beyond the necessary exposure for a plif.  posterior lateral arthrodesis L4/5, morselized autograft  posterior nonsegmental instrumentation pedicle screws(nuvasive, cortical screws) L4/5   Discharge Exam: Blood pressure 130/58, pulse 95, temperature 98.1 F (36.7 C), temperature source Oral, resp. rate 20, height 5\' 11"  (1.803 m), weight 94.711 kg (208 lb 12.8 oz), SpO2 95 %. General appearance: alert, cooperative and appears stated age Neurologic: Mental status: Alert, oriented, thought content appropriate Cranial nerves: normal Motor: baseline movement similar to preop    Disposition:  spondylolisthesis Discharge Instructions    Walker rolling    Complete by:  As directed             Medication List    TAKE these medications        amLODipine 10 MG tablet  Commonly known as:  NORVASC  Take 10 mg by mouth daily.     calcium gluconate 500 MG tablet  Take 500 mg by  mouth daily.     carbidopa-levodopa 25-100 MG per tablet  Commonly known as:  SINEMET IR  Take 1 tablet by mouth 3  times daily     cyclobenzaprine 10 MG tablet  Commonly known as:  FLEXERIL  Take 1 tablet (10 mg total) by mouth 3 (three) times daily as needed for muscle spasms.     glucosamine-chondroitin 500-400 MG tablet  Take 1 tablet by mouth once.     ketoconazole 2 % cream  Commonly known as:  NIZORAL  Apply 1 application topically daily as needed for irritation.     multivitamin tablet  Take 1 tablet by mouth daily.     omeprazole 20 MG tablet  Commonly known as:  PRILOSEC OTC  Take 20 mg by mouth daily.     oxyCODONE-acetaminophen 5-325 MG per tablet  Commonly known as:  ROXICET  Take 1 tablet by mouth every 6 (six) hours as needed for severe pain.     pramipexole 0.5 MG tablet  Commonly known as:  MIRAPEX  Take 1 tablet (0.5 mg total) by mouth 3 (three) times daily.     STOOL SOFTENER PO  Take by mouth 2 (two) times daily.     telmisartan-hydrochlorothiazide 80-25 MG per tablet  Commonly known as:  MICARDIS HCT  Take 1 tablet by mouth daily.     vitamin C 500 MG tablet  Commonly known as:  ASCORBIC ACID  Take 500 mg by mouth daily.           Follow-up Information    Follow up with Aleem Elza L, MD In 3 weeks.   Specialty:  Neurosurgery  Why:  call office to make an appointment   Contact information:   [address] [place] [address] [phone]       Signed: Janila Arrazola L 12/09/2013, 5:22 PM

## 2013-12-09 NOTE — Clinical Social Work Note (Signed)
Patient has a bed at [facility]. FL-2 on chart for MD signature. CSW continues to follow to facilitate pt's discharge needs.   Glendon Axe, MSW, LCSWA [phone] 12/09/2013 11:24 AM

## 2013-12-09 NOTE — Discharge Instructions (Signed)

## 2013-12-09 NOTE — Progress Notes (Signed)
Patient has refused the CPAP machine for tonight. Patient is currently on RA, resting comfortably, in no apparent distress. Patient is aware to call if he does change his mind about wearing the machine. RT will continue to assist as needed.

## 2013-12-09 NOTE — Progress Notes (Signed)
Asked by RN CM to clarify options for this pt's rehabilitation. Dr. Naaman Plummer consulted for rehab options yesterday. Options were discussed with pt and his wife of home health or SNF rehab. Pt doing very well with therapy mobilizing 300 feet with minguard assist with a RW. He is not in need of intense inpt rehab, and  SNF or [place] recommended. I discussed with RN CM and SW and understand SNF bed is available. Please call me for any questions. [phone]

## 2013-12-09 NOTE — Progress Notes (Signed)
Occupational Therapy Treatment Patient Details Name: [name] MRN: [id number] DOB: 1931-11-03 Today's Date: 12/09/2013    History of present illness 80 y.o. male admitted for L4-5 PLIF and arthrodesis due tp L4-5 spinal, bilateral lateral recess and foraminal stenosis. PMH significant for Parkinson's (affecting L side predominantly), HTN,  hyperlipidemia, hx of CA, complex sleep apnea on BiPAP ST, and GERD.   OT comments  Pt progressing well with ADL retraining and adherence to back precautions. Pt completed upper and lower body bathing/dressing with AE at supervision level. Reinforcement of back precautions is still needed during functional tasks especially excessive bending. OT recommending SNF to increase independence and safety with ADLs and functional mobility. Will continue to follow acutely to address goals.   Follow Up Recommendations  SNF;Supervision/Assistance - 24 hour    Equipment Recommendations  Other (comment) (RW-2 wheeled)    Recommendations for Other Services      Precautions / Restrictions Precautions Precautions: Back;Fall Precaution Comments: Pt able to verbalize 3/3 back precautions Restrictions Weight Bearing Restrictions: No       Mobility Bed Mobility Overal bed mobility: Needs Assistance Bed Mobility: Rolling;Sidelying to Sit;Sit to Sidelying Rolling: Supervision Sidelying to sit: Supervision     Sit to sidelying: Min assist General bed mobility comments: HOB flat, use of bedrails. (A) for LEs back into bed and A for trunk positioning once into bed. Verbal cues for log roll technique.  Transfers Overall transfer level: Needs assistance Equipment used: Rolling walker (2 wheeled) Transfers: Sit to/from Stand Sit to Stand: Min guard         General transfer comment: Min guard (A) for safety and balance. Verbal cues to avoid excessive bending upon standing.    Balance Overall balance assessment: Needs assistance Sitting-balance  support: No upper extremity supported;Feet supported Sitting balance-Leahy Scale: Fair     Standing balance support: Bilateral upper extremity supported;During functional activity Standing balance-Leahy Scale: Poor                     ADL Overall ADL's : Needs assistance/impaired     Grooming: Wash/dry hands;Wash/dry face;Supervision/safety;Cueing for safety;Standing Grooming Details (indicate cue type and reason): Verbal cues to avoid bending Upper Body Bathing: Supervision/ safety;Sitting   Lower Body Bathing: Supervison/ safety;With adaptive equipment;Cueing for back precautions;Sit to/from stand Lower Body Bathing Details (indicate cue type and reason): Verbal cues to avoid excessive bending Upper Body Dressing : Supervision/safety;Sitting   Lower Body Dressing: Minimal assistance;With adaptive equipment;Cueing for sequencing;Cueing for back precautions;Sitting/lateral leans Lower Body Dressing Details (indicate cue type and reason): Pt using sock aide and reacher to don/doff socks. Min (A) due to damp skin and wide foot, pt having difficulty pulling up on sock aide. Toilet Transfer: Min guard;Ambulation;BSC;RW Toilet Transfer Details (indicate cue type and reason): Verbal cues to avoid bending Toileting- Clothing Manipulation and Hygiene: Supervision/safety;Adhering to back precautions;Sit to/from stand       Functional mobility during ADLs: Electronics engineer     Praxis      Cognition   Behavior During Therapy: [facility] for tasks assessed/performed Overall Cognitive Status: Within Functional Limits for tasks assessed                       Extremity/Trunk Assessment  Exercises     Shoulder Instructions       General Comments      Pertinent Vitals/ Pain       Pain Assessment: 0-10 Pain Score: 5  Pain Location: low back Pain Descriptors / Indicators: Sore Pain  Intervention(s): Monitored during session;Repositioned  Home Living                                          Prior Functioning/Environment              Frequency Min 2X/week     Progress Toward Goals  OT Goals(current goals can now be found in the care plan section)  Progress towards OT goals: Progressing toward goals  Acute Rehab OT Goals Patient Stated Goal: none stated OT Goal Formulation: With patient Time For Goal Achievement: 12/19/13 Potential to Achieve Goals: Good ADL Goals Pt Will Perform Lower Body Bathing: with adaptive equipment;sit to/from stand;with min guard assist Pt Will Perform Lower Body Dressing: with min guard assist;sit to/from stand;with adaptive equipment Pt Will Transfer to Toilet: with supervision;ambulating;bedside commode;grab bars Pt Will Perform Toileting - Clothing Manipulation and hygiene: with supervision;sit to/from stand Additional ADL Goal #1: Pt will verbalize and demonstrate 3/3 back precautions with 100% accuracy prior to discharge.  Plan Discharge plan remains appropriate    Co-evaluation                 End of Session Equipment Utilized During Treatment: Gait belt;Rolling walker   Activity Tolerance Patient tolerated treatment well   Patient Left in bed;with call bell/phone within reach;with bed alarm set   Nurse Communication Mobility status;Precautions;Other (comment) (Pt bathed)        Time: 7544-9201 OT Time Calculation (min): 40 min  Charges:    Redmond Baseman 12/09/2013, 3:33 PM

## 2013-12-09 NOTE — Progress Notes (Signed)
Speech Language Pathology Treatment: Dysphagia  Patient Details Name: [name] MRN: [id number] DOB: 11-Feb-1931 Today's Date: 12/09/2013 Time: 2297-9892 SLP Time Calculation (min) (ACUTE ONLY): 15 min  Assessment / Plan / Recommendation Clinical Impression  Pt continues to demonstrate excellent tolerance of thin liquids without signs of aspiration. He reports having eaten the majority of his regular texture breakfast without struggle; wife and daughter confirm. SLP provided min verbal cue for pt follow basic precautions of smaller single sips to better coordinate respiration and swallowing, which he return demonstrated. SLP also encouraged pt and family to consider pt at risk for dysphagia into the future with any significant deconditioning or acute illness/injury. They verbalized understanding. All education complete, SLP will sign off.    HPI HPI: Pt is an 80 year old male who underwent lumbar decompression and fusion at L4-5 on 12/04/13. He has intubted for the procedure. He has developed difficulty swallowing medications, deosntrated signs of aspriaiton at bedside, FEES recommended for objective evaluation of swallowing.    Pertinent Vitals Pain Score: 5  Pain Location: back Pain Descriptors / Indicators: Sore Pain Intervention(s): Monitored during session  SLP Plan  Continue with current plan of care    Recommendations Diet recommendations: Thin liquid;Regular Liquids provided via: Cup Medication Administration: Whole meds with liquid Supervision: Full supervision/cueing for compensatory strategies;Staff to assist with self feeding Compensations: Slow rate;Small sips/bites Postural Changes and/or Swallow Maneuvers: Seated upright 90 degrees              Oral Care Recommendations: Oral care BID Follow up Recommendations: None Plan: Continue with current plan of care    GO    [facility] [address], [place] CCC-SLP [phone]  [name] 12/09/2013, 2:31 PM

## 2013-12-09 NOTE — Progress Notes (Signed)
Physical Therapy Treatment Patient Details Name: [name] MRN: [id number] DOB: 1931/04/05 Today's Date: 12/09/2013    History of Present Illness 80 y.o. male admitted for L4-5 PLIF and arthrodesis due tp L4-5 spinal, bilateral lateral recess and foraminal stenosis. PMH significant for Parkinson's (affecting L side predominantly), HTN,  hyperlipidemia, hx of CA, complex sleep apnea on BiPAP ST, and GERD.    PT Comments    Patient continues to make steady progress this session. Patients continues to struggle most with bed mobility and requiring A. Spent increased time with patient and family discussing discharge questions as they were unsure of the plans for the patient. At this time PT continues to recommend STSNF and family requesting [facility] as it is close to their home. Will continue to follow   Follow Up Recommendations  SNF;Supervision/Assistance - 24 hour     Equipment Recommendations  Rolling walker with 5" wheels    Recommendations for Other Services       Precautions / Restrictions Precautions Precautions: Back;Fall Precaution Comments: Patient able to recall all precautions this session    Mobility  Bed Mobility Overal bed mobility: Needs Assistance           Sit to sidelying: Min assist General bed mobility comments: A for LEs back into bed and A for trunk positioning once into bed  Transfers Overall transfer level: Needs assistance Equipment used: Rolling walker (2 wheeled)   Sit to Stand: Min assist         General transfer comment: Min A to power fully upright and to ensure anterior weight shift over BOS. Cues for hand placement  Ambulation/Gait Ambulation/Gait assistance: Min guard Ambulation Distance (Feet): 300 Feet Assistive device: Rolling walker (2 wheeled) Gait Pattern/deviations: Step-through pattern;Decreased stride length Gait velocity: WFL   General Gait Details: Patient able to position RW better this session and did not  bump into surroundings   Stairs            Wheelchair Mobility    Modified Rankin (Stroke Patients Only)       Balance                                    Cognition Arousal/Alertness: Awake/alert Behavior During Therapy: WFL for tasks assessed/performed Overall Cognitive Status: Within Functional Limits for tasks assessed                      Exercises      General Comments        Pertinent Vitals/Pain Pain Score: 5  Pain Location: back Pain Descriptors / Indicators: Sore Pain Intervention(s): Monitored during session    Home Living                      Prior Function            PT Goals (current goals can now be found in the care plan section) Progress towards PT goals: Progressing toward goals    Frequency  Min 5X/week    PT Plan Current plan remains appropriate    Co-evaluation             End of Session   Activity Tolerance: Patient tolerated treatment well Patient left: with call bell/phone within reach;in bed     Time: 7322-0254 PT Time Calculation (min) (ACUTE ONLY): 39 min  Charges:  $Gait Training: 8-22 mins $Therapeutic Exercise: 23-37  mins                    G Codes:      Jacqualyn Posey 12/09/2013, 2:03 PM 12/09/2013 Jacqualyn Posey PTA [phone] pager [phone] office

## 2013-12-10 NOTE — Progress Notes (Signed)
Occupational Therapy Treatment Patient Details Name: [name] MRN: [id number] DOB: [birth year] Today's Date: 12/10/2013    History of present illness 80 y.o. male admitted for L4-5 PLIF and arthrodesis due tp L4-5 spinal, bilateral lateral recess and foraminal stenosis. PMH significant for Parkinson's (affecting L side predominantly), HTN,  hyperlipidemia, hx of CA, complex sleep apnea on BiPAP ST, and GERD.   OT comments  Pt and family plan to d/c home today rather than to SNF. Pt participated in ADL session using AE for LB ADLs. Pt is progressing well but continues to require Supervision for sit<>stand and VC's for back precautions. Educated pt/wife on fall prevention and energy conservation upon return home. Pt will need HHOT at d/c and family plans to get a hospital bed delivered today.     Follow Up Recommendations  Home health OT;Supervision/Assistance - 24 hour    Equipment Recommendations  Hospital bed;3 in 1 bedside comode    Recommendations for Other Services      Precautions / Restrictions Precautions Precautions: Back;Fall Precaution Comments: Pt able to verbalize 3/3 back precautions Restrictions Weight Bearing Restrictions: No       Mobility Bed Mobility               General bed mobility comments: Pt sitting up in recliner following PT when OT arrived.   Transfers Overall transfer level: Needs assistance Equipment used: Rolling walker (2 wheeled) Transfers: Sit to/from Stand Sit to Stand: Supervision         General transfer comment: Supervision for safety.         ADL Overall ADL's : Needs assistance/impaired             Lower Body Bathing: Supervison/ safety;With adaptive equipment;Cueing for back precautions;Sit to/from stand Lower Body Bathing Details (indicate cue type and reason): Supervision for back precautions.  Upper Body Dressing : Supervision/safety;Sitting   Lower Body Dressing: Sit to/from stand;With adaptive  equipment;Set up;Supervision/safety Lower Body Dressing Details (indicate cue type and reason): Pt able to use reacher to don underwear and pants. Pt familiar with sock aid and was able to slip on his slippers.                General ADL Comments: Pt donned clothing to prepare for d/c. Pt utilized AE to don underwear and pants and required VC's for technique as well as setup and supervision for back precautions. Pt's wife will be home to assist. Pt now plans d/c to home.                 Cognition  Arousal/Alertness: Awake/Alert Behavior During Therapy: WFL for tasks assessed/performed Overall Cognitive Status: Within Functional Limits for tasks assessed                                    Pertinent Vitals/ Pain       Pain Assessment: 0-10 Pain Score: 5  Pain Location: low back Pain Descriptors / Indicators: Sore Pain Intervention(s): Monitored during session         Frequency Min 2X/week     Progress Toward Goals  OT Goals(current goals can now be found in the care plan section)  Progress towards OT goals: Progressing toward goals     Plan Discharge plan needs to be updated       End of Session Equipment Utilized During Treatment: Rolling walker   Activity Tolerance Patient tolerated treatment well  Patient Left in chair;with call bell/phone within reach;with family/visitor present   Nurse Communication          Time: [phone] OT Time Calculation (min): 16 min  Charges: OT General Charges $OT Visit: 1 Procedure OT Treatments $Self Care/Home Management : 8-22 mins  Juluis Rainier 12/10/2013, 10:47 AM  Cyndie Chime, OTR/L Occupational Therapist [phone] (pager)

## 2013-12-10 NOTE — Progress Notes (Signed)
Physical Therapy Treatment Patient Details Name: [name] MRN: [id number] DOB: [birth year] Today's Date: 12/10/2013    History of Present Illness 80 y.o. male admitted for L4-5 PLIF and arthrodesis due tp L4-5 spinal, bilateral lateral recess and foraminal stenosis. PMH significant for Parkinson's (affecting L side predominantly), HTN,  hyperlipidemia, hx of CA, complex sleep apnea on BiPAP ST, and GERD.    PT Comments    Patient is progressing well and family has decided to take patient home instead of going to SNF for further therapy. Family has ordered a hospital bed for home use. Spoke with family regarding precautions, transfers and safety at home. Patient has all equipment needed  Follow Up Recommendations  Home health PT;Supervision/Assistance - 24 hour     Equipment Recommendations  None recommended by PT    Recommendations for Other Services       Precautions / Restrictions Precautions Precautions: Back;Fall Precaution Comments: Pt able to verbalize 3/3 back precautions Restrictions Weight Bearing Restrictions: No    Mobility  Bed Mobility           Sit to supine: Min guard   General bed mobility comments: Minguard with use of rails. Now has hospital bed for home use  Transfers Overall transfer level: Needs assistance Equipment used: Rolling walker (2 wheeled) Transfers: Sit to/from Stand Sit to Stand: Supervision         General transfer comment: Supervision for safety.   Ambulation/Gait Ambulation/Gait assistance: Supervision Ambulation Distance (Feet): 600 Feet Assistive device: Rolling walker (2 wheeled) Gait Pattern/deviations: Decreased stride length;Step-through pattern;Trunk flexed Gait velocity: WFL   General Gait Details: Patient able to position RW better this session. Cues for upright posture   Stairs            Wheelchair Mobility    Modified Rankin (Stroke Patients Only)       Balance                                     Cognition Arousal/Alertness: Awake/alert Behavior During Therapy: WFL for tasks assessed/performed Overall Cognitive Status: Within Functional Limits for tasks assessed                      Exercises      General Comments        Pertinent Vitals/Pain Pain Assessment: 0-10 Pain Score: 5  Pain Location: back Pain Descriptors / Indicators: Sore Pain Intervention(s): Monitored during session    Home Living                      Prior Function            PT Goals (current goals can now be found in the care plan section) Progress towards PT goals: Progressing toward goals    Frequency  Min 5X/week    PT Plan Discharge plan needs to be updated;Equipment recommendations need to be updated    Co-evaluation             End of Session Equipment Utilized During Treatment: Gait belt Activity Tolerance: Patient tolerated treatment well Patient left: in chair;with call bell/phone within reach;with family/visitor present     Time: 0254-2706 PT Time Calculation (min) (ACUTE ONLY): 30 min  Charges:  $Gait Training: 8-22 mins $Therapeutic Activity: 8-22 mins  G Codes:      Jacqualyn Posey 12/10/2013, 11:41 AM  12/10/2013 Jacqualyn Posey PTA [phone] pager [phone] office

## 2013-12-10 NOTE — Clinical Social Work Note (Signed)
Clinical Social Worker notified by RN of pt and pt's family decision to return home instead of SNF placement. Pt and pt's family declined SNF placement. CSW notified RNCM and [facility] of new disposition plan.   No further intervention needed. CSW signing off.   Glendon Axe, MSW, LCSWA [phone] 12/10/2013 10:30 AM

## 2013-12-10 NOTE — Progress Notes (Signed)
Talked to patient/ spouse about DCP; pt plans to return home at discharge and not a SNF; [facility] choices offered, spouse chose [facility] for HHPT/OT; no DME needed, spouse stated that they are having a hospital bed delivered today, he has a rolling walker and handicap bathroom; Miranda with [facility] called for arrangements; Aneta Mins [phone]

## 2013-12-10 NOTE — Progress Notes (Signed)
Discharge orders received.  Discharge instructions and follow-up appointments reviewed with the patient and his wife.  Pt set up with [facility].  VSS.  IV removed and education complete.  Transported out via wheelchair, family present. Cori Razor, RN 12/10/2013

## 2013-12-11 DIAGNOSIS — G20 Parkinson's disease: Secondary | ICD-10-CM | POA: Diagnosis not present

## 2013-12-11 DIAGNOSIS — I10 Essential (primary) hypertension: Secondary | ICD-10-CM | POA: Diagnosis not present

## 2013-12-11 DIAGNOSIS — Z4789 Encounter for other orthopedic aftercare: Secondary | ICD-10-CM | POA: Diagnosis not present

## 2013-12-12 DIAGNOSIS — G20 Parkinson's disease: Secondary | ICD-10-CM | POA: Diagnosis not present

## 2013-12-12 DIAGNOSIS — Z4789 Encounter for other orthopedic aftercare: Secondary | ICD-10-CM | POA: Diagnosis not present

## 2013-12-12 DIAGNOSIS — I10 Essential (primary) hypertension: Secondary | ICD-10-CM | POA: Diagnosis not present

## 2013-12-15 DIAGNOSIS — Z4789 Encounter for other orthopedic aftercare: Secondary | ICD-10-CM | POA: Diagnosis not present

## 2013-12-15 DIAGNOSIS — G20 Parkinson's disease: Secondary | ICD-10-CM | POA: Diagnosis not present

## 2013-12-15 DIAGNOSIS — I10 Essential (primary) hypertension: Secondary | ICD-10-CM | POA: Diagnosis not present

## 2013-12-16 DIAGNOSIS — Z4789 Encounter for other orthopedic aftercare: Secondary | ICD-10-CM | POA: Diagnosis not present

## 2013-12-16 DIAGNOSIS — I10 Essential (primary) hypertension: Secondary | ICD-10-CM | POA: Diagnosis not present

## 2013-12-16 DIAGNOSIS — G20 Parkinson's disease: Secondary | ICD-10-CM | POA: Diagnosis not present

## 2013-12-19 DIAGNOSIS — Z4789 Encounter for other orthopedic aftercare: Secondary | ICD-10-CM | POA: Diagnosis not present

## 2013-12-19 DIAGNOSIS — I10 Essential (primary) hypertension: Secondary | ICD-10-CM | POA: Diagnosis not present

## 2013-12-19 DIAGNOSIS — G20 Parkinson's disease: Secondary | ICD-10-CM | POA: Diagnosis not present

## 2013-12-22 DIAGNOSIS — G20 Parkinson's disease: Secondary | ICD-10-CM | POA: Diagnosis not present

## 2013-12-22 DIAGNOSIS — Z4789 Encounter for other orthopedic aftercare: Secondary | ICD-10-CM | POA: Diagnosis not present

## 2013-12-22 DIAGNOSIS — I10 Essential (primary) hypertension: Secondary | ICD-10-CM | POA: Diagnosis not present

## 2013-12-24 DIAGNOSIS — G20 Parkinson's disease: Secondary | ICD-10-CM | POA: Diagnosis not present

## 2013-12-24 DIAGNOSIS — Z4789 Encounter for other orthopedic aftercare: Secondary | ICD-10-CM | POA: Diagnosis not present

## 2013-12-24 DIAGNOSIS — I10 Essential (primary) hypertension: Secondary | ICD-10-CM | POA: Diagnosis not present

## 2013-12-26 DIAGNOSIS — Z4789 Encounter for other orthopedic aftercare: Secondary | ICD-10-CM | POA: Diagnosis not present

## 2013-12-26 DIAGNOSIS — G20 Parkinson's disease: Secondary | ICD-10-CM | POA: Diagnosis not present

## 2013-12-26 DIAGNOSIS — I10 Essential (primary) hypertension: Secondary | ICD-10-CM | POA: Diagnosis not present

## 2013-12-29 DIAGNOSIS — G20 Parkinson's disease: Secondary | ICD-10-CM | POA: Diagnosis not present

## 2013-12-29 DIAGNOSIS — Z4789 Encounter for other orthopedic aftercare: Secondary | ICD-10-CM | POA: Diagnosis not present

## 2013-12-29 DIAGNOSIS — I10 Essential (primary) hypertension: Secondary | ICD-10-CM | POA: Diagnosis not present

## 2013-12-31 DIAGNOSIS — G20 Parkinson's disease: Secondary | ICD-10-CM | POA: Diagnosis not present

## 2013-12-31 DIAGNOSIS — Z4789 Encounter for other orthopedic aftercare: Secondary | ICD-10-CM | POA: Diagnosis not present

## 2013-12-31 DIAGNOSIS — I10 Essential (primary) hypertension: Secondary | ICD-10-CM | POA: Diagnosis not present

## 2014-01-01 DIAGNOSIS — Z6827 Body mass index (BMI) 27.0-27.9, adult: Secondary | ICD-10-CM | POA: Diagnosis not present

## 2014-01-01 DIAGNOSIS — M4316 Spondylolisthesis, lumbar region: Secondary | ICD-10-CM | POA: Diagnosis not present

## 2014-01-01 DIAGNOSIS — M4806 Spinal stenosis, lumbar region: Secondary | ICD-10-CM | POA: Diagnosis not present

## 2014-01-05 DIAGNOSIS — G20 Parkinson's disease: Secondary | ICD-10-CM | POA: Diagnosis not present

## 2014-01-05 DIAGNOSIS — Z4789 Encounter for other orthopedic aftercare: Secondary | ICD-10-CM | POA: Diagnosis not present

## 2014-01-05 DIAGNOSIS — I10 Essential (primary) hypertension: Secondary | ICD-10-CM | POA: Diagnosis not present

## 2014-01-07 DIAGNOSIS — I10 Essential (primary) hypertension: Secondary | ICD-10-CM | POA: Diagnosis not present

## 2014-01-07 DIAGNOSIS — Z4789 Encounter for other orthopedic aftercare: Secondary | ICD-10-CM | POA: Diagnosis not present

## 2014-01-07 DIAGNOSIS — G20 Parkinson's disease: Secondary | ICD-10-CM | POA: Diagnosis not present

## 2014-02-04 DIAGNOSIS — E785 Hyperlipidemia, unspecified: Secondary | ICD-10-CM | POA: Diagnosis not present

## 2014-02-04 DIAGNOSIS — R7301 Impaired fasting glucose: Secondary | ICD-10-CM | POA: Diagnosis not present

## 2014-02-04 DIAGNOSIS — I10 Essential (primary) hypertension: Secondary | ICD-10-CM | POA: Diagnosis not present

## 2014-02-10 DIAGNOSIS — M6281 Muscle weakness (generalized): Secondary | ICD-10-CM | POA: Diagnosis not present

## 2014-02-10 DIAGNOSIS — M545 Low back pain: Secondary | ICD-10-CM | POA: Diagnosis not present

## 2014-02-10 DIAGNOSIS — M25651 Stiffness of right hip, not elsewhere classified: Secondary | ICD-10-CM | POA: Diagnosis not present

## 2014-02-10 DIAGNOSIS — M25652 Stiffness of left hip, not elsewhere classified: Secondary | ICD-10-CM | POA: Diagnosis not present

## 2014-02-12 DIAGNOSIS — M25651 Stiffness of right hip, not elsewhere classified: Secondary | ICD-10-CM | POA: Diagnosis not present

## 2014-02-12 DIAGNOSIS — M6281 Muscle weakness (generalized): Secondary | ICD-10-CM | POA: Diagnosis not present

## 2014-02-12 DIAGNOSIS — M545 Low back pain: Secondary | ICD-10-CM | POA: Diagnosis not present

## 2014-02-12 DIAGNOSIS — E782 Mixed hyperlipidemia: Secondary | ICD-10-CM | POA: Diagnosis not present

## 2014-02-12 DIAGNOSIS — M25652 Stiffness of left hip, not elsewhere classified: Secondary | ICD-10-CM | POA: Diagnosis not present

## 2014-02-12 DIAGNOSIS — G20 Parkinson's disease: Secondary | ICD-10-CM | POA: Diagnosis not present

## 2014-02-12 DIAGNOSIS — R7301 Impaired fasting glucose: Secondary | ICD-10-CM | POA: Diagnosis not present

## 2014-02-12 DIAGNOSIS — I10 Essential (primary) hypertension: Secondary | ICD-10-CM | POA: Diagnosis not present

## 2014-02-16 ENCOUNTER — Telehealth: Payer: Self-pay | Admitting: Neurology

## 2014-02-16 ENCOUNTER — Other Ambulatory Visit: Payer: Self-pay | Admitting: Dermatology

## 2014-02-16 DIAGNOSIS — G4731 Primary central sleep apnea: Secondary | ICD-10-CM

## 2014-02-16 DIAGNOSIS — C44622 Squamous cell carcinoma of skin of right upper limb, including shoulder: Secondary | ICD-10-CM | POA: Diagnosis not present

## 2014-02-16 DIAGNOSIS — D485 Neoplasm of uncertain behavior of skin: Secondary | ICD-10-CM | POA: Diagnosis not present

## 2014-02-16 DIAGNOSIS — Z85828 Personal history of other malignant neoplasm of skin: Secondary | ICD-10-CM | POA: Diagnosis not present

## 2014-02-16 NOTE — Telephone Encounter (Signed)
[name] with [facility] called and stated the patient needed a prescription for CPAP supplies I see where he was seen in November and due back on the 16th of this month. Prescription can be faxed to [phone]

## 2014-02-16 NOTE — Telephone Encounter (Signed)
cpap supplies ordered. DME: [facility]

## 2014-02-17 DIAGNOSIS — M25652 Stiffness of left hip, not elsewhere classified: Secondary | ICD-10-CM | POA: Diagnosis not present

## 2014-02-17 DIAGNOSIS — M25651 Stiffness of right hip, not elsewhere classified: Secondary | ICD-10-CM | POA: Diagnosis not present

## 2014-02-17 DIAGNOSIS — M545 Low back pain: Secondary | ICD-10-CM | POA: Diagnosis not present

## 2014-02-17 DIAGNOSIS — M6281 Muscle weakness (generalized): Secondary | ICD-10-CM | POA: Diagnosis not present

## 2014-02-18 DIAGNOSIS — M25652 Stiffness of left hip, not elsewhere classified: Secondary | ICD-10-CM | POA: Diagnosis not present

## 2014-02-18 DIAGNOSIS — M6281 Muscle weakness (generalized): Secondary | ICD-10-CM | POA: Diagnosis not present

## 2014-02-18 DIAGNOSIS — M25651 Stiffness of right hip, not elsewhere classified: Secondary | ICD-10-CM | POA: Diagnosis not present

## 2014-02-18 DIAGNOSIS — M545 Low back pain: Secondary | ICD-10-CM | POA: Diagnosis not present

## 2014-02-20 DIAGNOSIS — M545 Low back pain: Secondary | ICD-10-CM | POA: Diagnosis not present

## 2014-02-20 DIAGNOSIS — M25652 Stiffness of left hip, not elsewhere classified: Secondary | ICD-10-CM | POA: Diagnosis not present

## 2014-02-20 DIAGNOSIS — M25651 Stiffness of right hip, not elsewhere classified: Secondary | ICD-10-CM | POA: Diagnosis not present

## 2014-02-20 DIAGNOSIS — M6281 Muscle weakness (generalized): Secondary | ICD-10-CM | POA: Diagnosis not present

## 2014-02-23 DIAGNOSIS — M25651 Stiffness of right hip, not elsewhere classified: Secondary | ICD-10-CM | POA: Diagnosis not present

## 2014-02-23 DIAGNOSIS — M25652 Stiffness of left hip, not elsewhere classified: Secondary | ICD-10-CM | POA: Diagnosis not present

## 2014-02-23 DIAGNOSIS — M6281 Muscle weakness (generalized): Secondary | ICD-10-CM | POA: Diagnosis not present

## 2014-02-23 DIAGNOSIS — M545 Low back pain: Secondary | ICD-10-CM | POA: Diagnosis not present

## 2014-02-24 DIAGNOSIS — M25652 Stiffness of left hip, not elsewhere classified: Secondary | ICD-10-CM | POA: Diagnosis not present

## 2014-02-24 DIAGNOSIS — M25651 Stiffness of right hip, not elsewhere classified: Secondary | ICD-10-CM | POA: Diagnosis not present

## 2014-02-24 DIAGNOSIS — M545 Low back pain: Secondary | ICD-10-CM | POA: Diagnosis not present

## 2014-02-24 DIAGNOSIS — M6281 Muscle weakness (generalized): Secondary | ICD-10-CM | POA: Diagnosis not present

## 2014-02-26 DIAGNOSIS — M6281 Muscle weakness (generalized): Secondary | ICD-10-CM | POA: Diagnosis not present

## 2014-02-26 DIAGNOSIS — M25652 Stiffness of left hip, not elsewhere classified: Secondary | ICD-10-CM | POA: Diagnosis not present

## 2014-02-26 DIAGNOSIS — M25651 Stiffness of right hip, not elsewhere classified: Secondary | ICD-10-CM | POA: Diagnosis not present

## 2014-02-26 DIAGNOSIS — M545 Low back pain: Secondary | ICD-10-CM | POA: Diagnosis not present

## 2014-03-03 ENCOUNTER — Ambulatory Visit (INDEPENDENT_AMBULATORY_CARE_PROVIDER_SITE_OTHER): Payer: Medicare Other | Admitting: Neurology

## 2014-03-03 ENCOUNTER — Encounter: Payer: Self-pay | Admitting: Neurology

## 2014-03-03 VITALS — BP 121/72 | HR 99 | Temp 98.4°F | Ht 71.0 in | Wt 207.0 lb

## 2014-03-03 DIAGNOSIS — Z419 Encounter for procedure for purposes other than remedying health state, unspecified: Secondary | ICD-10-CM

## 2014-03-03 DIAGNOSIS — K5909 Other constipation: Secondary | ICD-10-CM

## 2014-03-03 DIAGNOSIS — G4731 Primary central sleep apnea: Secondary | ICD-10-CM

## 2014-03-03 DIAGNOSIS — G4733 Obstructive sleep apnea (adult) (pediatric): Secondary | ICD-10-CM | POA: Diagnosis not present

## 2014-03-03 DIAGNOSIS — M25651 Stiffness of right hip, not elsewhere classified: Secondary | ICD-10-CM | POA: Diagnosis not present

## 2014-03-03 DIAGNOSIS — M25652 Stiffness of left hip, not elsewhere classified: Secondary | ICD-10-CM | POA: Diagnosis not present

## 2014-03-03 DIAGNOSIS — G20 Parkinson's disease: Secondary | ICD-10-CM

## 2014-03-03 DIAGNOSIS — K5900 Constipation, unspecified: Secondary | ICD-10-CM

## 2014-03-03 DIAGNOSIS — M48061 Spinal stenosis, lumbar region without neurogenic claudication: Secondary | ICD-10-CM

## 2014-03-03 DIAGNOSIS — M6281 Muscle weakness (generalized): Secondary | ICD-10-CM | POA: Diagnosis not present

## 2014-03-03 DIAGNOSIS — M545 Low back pain: Secondary | ICD-10-CM | POA: Diagnosis not present

## 2014-03-03 DIAGNOSIS — M4806 Spinal stenosis, lumbar region: Secondary | ICD-10-CM

## 2014-03-03 MED ORDER — CARBIDOPA-LEVODOPA 25-100 MG PO TABS: 1.0000 | ORAL_TABLET | Freq: Four times a day (QID) | ORAL | Status: DC

## 2014-03-03 MED ORDER — PRAMIPEXOLE DIHYDROCHLORIDE 0.5 MG PO TABS: 0.5000 mg | ORAL_TABLET | Freq: Three times a day (TID) | ORAL | Status: DC

## 2014-03-03 NOTE — Progress Notes (Signed)
Subjective:    Patient ID: [name] is a 81 y.o. male.  HPI     Interim history:   [name] is a very pleasant 81 year old right-handed gentleman with an underlying medical history of hypertension, hyperlipidemia, history of cancer, ex-smoker, and complex sleep apnea on BiPAP ST, who presents for followup consultation off his left-sided predominant Parkinson's disease as well as his sleep apnea. He is accompanied by his wife again today. I last saw him on 12/02/2013, at which time he was compliant with BiPAP ST. he had spine surgery under Dr. Cyndy Freeze on 12/04/2013 which went well. He is in physical therapy. He had an L spine MRI on 11/06/13: Degenerative lumbar spondylosis with multilevel disc disease and facet disease. There is bilateral lateral recess and bilateral foraminal stenosis at L2-3 and L3-4. The most significant level however is L4-5 with there is severe spinal, bilateral lateral recess and foraminal stenosis. We talked about his fluid intake. He was still not consistent with his dose timings for his Sinemet and Mirapex.  Today, I reviewed his compliance data from 01/27/2014 through 02/25/2014 which is a total of 30 days during which time he used his machine every night with percent used days greater than 4 hours of 87%, indicating very good compliance with an average usage of 5 hours and 6 minutes, residual AHI of 3.5 per hour and leak acceptable with the 95th percentile at 15 L/m. He received new supplies recently. He has some thick mucus first thing in the morning. His tremor has become worse per wife. His surgery relieved much of his pain. He no longer is on any pain meds. For constipation he takes 2 stool softeners.   I saw him on 05/27/2013, at which time he reported being compliant with his BiPAP, averaging 4-5 hours each night. He was still taking his Sinemet with his meals on most days. He felt his tremor was a little worse. His wife felt that he was otherwise stable.  He denied any new cognitive issues, depression, hallucinations, anxiety, delusions. He was drinking 3 cups of coffee in the morning and not enough water. I did not increase his medication but asked him to take his Sinemet away from his mealtimes. I considered Linzess for chronic constipation but asked him to increase his water intake and watch constipation symptoms before we use medications.  I reviewed his compliance data from 10/31/2013 through 11/29/2013 which is a total of 30 days during which time he used his machine every night except for 1 night. Percent used days greater than 4 hours was 90% indicating excellent compliance. Residual AHI at 2.5 per hour, leaked low. Pressure at 13/9 with a rate of 10.  I saw him on 11/27/2012, at which time I did not change his medication regimen with the exception of the timing for his Sinemet and Mirapex: I advised him to take it at 7 AM, 11 AM and 4 PM. I also asked him to continue using his BiPAP regularly and take it with him on any vacation trips. He was congratulated on his compliance.  I saw him on 07/26/2012 after had his sleep study. He has been compliant on BiPAP therapy. I had increased his Sinemet to one pill 3 times a day. He was taking it with his meals and did not note any significant improvement in his symptoms, but, then again, he was taking it right after his meals. He was asking whether he should take his BiPAP machine with him to his planned  trip to [place].   I first met him on 02/19/2012 after his baseline sleep study. His total AHI was 39.5 per hour based primarily on central apneas. His obstructive AHI was around 15 per hour. His oxyhemoglobin desaturation nadir was 85% and he spent 3 hours and 31 minutes below the saturation of 90% for the night. I asked him to come back for a CPAP titration study, possibly BiPAP but he wanted to hold off until his appointment in April as he was going to be out of town and he also wanted to bring his wife  for discussion.  I then saw him back on 04/17/2012 and again went over his test results with him and his wife. He had been doing well from the PD standpoint. He agreed to come back for another sleep study with full night titration. His sleep titration study was on 05/14/2012 and I went over his test results with him and his wife in detail during our visit in July. Sleep efficiency was reduced at 71.5% with a latency to sleep of 2 minutes. Wake after sleep onset was highly elevated at 124 minutes with mild to moderate sleep fragmentation noted. He had increased percentage of REM sleep at 27.2%. He had a normal REM latency. There were no significant aortic leg movements. He was started on CPAP at a pressure of 5 cm and titrated up to 9 cm of water pressure but he had significant central apneas and therefore changed to BiPAP at 11/7 and then switch to ST mode for ongoing central events. His final pressure was 13/9 with a backup rate of 10 on which she had a residual AHI of 0 per hour and supine REM sleep achieved. Oxyhemoglobin desaturation nadir on the final pressure was 90%.  He was placed on BiPAP ST at 13/9 cm with a backup rate of 10. I reviewed his compliance from 06/26/2012 through 07/25/2012 (29 days), during which time he used it every day. His average usage was 5 hours and 6 minutes and percent used days greater than 4 hours was 96% indicating excellent compliance. His residual AHI was around 6 indicating fairly reasonable pressure settings. He reported tolerating the treatment and he changed from a FFM to a nasal mask. He denied depression, memory loss, lightheadedness, or hallucinations. I also reviewed more recent compliance data from 09/29/2012 through 10/28/2012 (30 days), during which time he used his machine every day. His average usage was 5 hours and 23 minutes, his percent used days greater than 4 hours was 29 days, which is 97%, indicating excellent compliance. His residual AHI was 2.6 per hour  indicating an appropriate treatment setting of 13/9 cm with a backup rate of 10 per minute.   His Past Medical History Is Significant For: Past Medical History  Diagnosis Date  . Hypercholesteremia   . Parkinson's disease   . Hypertension   . Sleep disorder   . Ex-smoker   . Cancer     h/o  skin  cancer  . Sleep apnea     ?? sleep apnea....tested 2013 @ [facility]     . GERD (gastroesophageal reflux disease)   . Arthritis   . Lumbar stenosis     His Past Surgical History Is Significant For: Past Surgical History  Procedure Laterality Date  . Tonsillectomy Bilateral   . Meniscus repair      right knee  2000  . Eye surgery      bilateral  cataracts  . Appendectomy    .  Back surgery  11/2013    His Family History Is Significant For: Family History  Problem Relation Age of Onset  . Dementia Mother   . Cancer Brother   . Stroke Brother     His Social History Is Significant For: History   Social History  . Marital Status: Married    Spouse Name: Clinical research associate  . Number of Children: 4  . Years of Education: 80   Social History Main Topics  . Smoking status: Former Smoker -- 1.00 packs/day    Types: Cigarettes, Pipe    Start date: 01/22/1951    Quit date: 01/21/1989  . Smokeless tobacco: Never Used  . Alcohol Use: No  . Drug Use: No  . Sexual Activity: Not on file   Other Topics Concern  . None   Social History Narrative    His Allergies Are:  No Known Allergies:   His Current Medications Are:  Outpatient Encounter Prescriptions as of 03/03/2014  Medication Sig  . amLODipine (NORVASC) 10 MG tablet Take 10 mg by mouth daily.  . calcium gluconate 500 MG tablet Take 500 mg by mouth daily.  . carbidopa-levodopa (SINEMET IR) 25-100 MG per tablet Take 1 tablet by mouth 3  times daily  . cyclobenzaprine (FLEXERIL) 10 MG tablet Take 1 tablet (10 mg total) by mouth 3 (three) times daily as needed for muscle spasms.  Mariane Baumgarten Calcium (STOOL SOFTENER PO)  Take by mouth 2 (two) times daily.  Marland Kitchen glucosamine-chondroitin 500-400 MG tablet Take 1 tablet by mouth once.  Marland Kitchen ketoconazole (NIZORAL) 2 % cream Apply 1 application topically daily as needed for irritation.   . Multiple Vitamin (MULTIVITAMIN) tablet Take 1 tablet by mouth daily.  Marland Kitchen omeprazole (PRILOSEC OTC) 20 MG tablet Take 20 mg by mouth daily.  Marland Kitchen oxyCODONE-acetaminophen (ROXICET) 5-325 MG per tablet Take 1 tablet by mouth every 6 (six) hours as needed for severe pain.  . pramipexole (MIRAPEX) 0.5 MG tablet Take 1 tablet (0.5 mg total) by mouth 3 (three) times daily.  Marland Kitchen telmisartan-hydrochlorothiazide (MICARDIS HCT) 80-25 MG per tablet Take 1 tablet by mouth daily.  . vitamin C (ASCORBIC ACID) 500 MG tablet Take 500 mg by mouth daily.  :  Review of Systems:  Out of a complete 14 point review of systems, all are reviewed and negative with the exception of these symptoms as listed below:   Review of Systems  Neurological: Positive for tremors.    Objective:  Neurologic Exam  Physical Exam Physical Examination:   Filed Vitals:   03/03/14 0809  BP: 121/72  Pulse: 99  Temp: 98.4 F (36.9 C)    General Examination: The patient is a very pleasant 81 y.o. male in no acute distress.  HEENT exam: He has a moderate degree of nuchal rigidity with a mild lower jaw and lip tremor, which is fairly constant. He has a mild to moderately masked facies with decreased eye blink rate. Hearing is mildly impaired. Funduscopic exam is normal bilaterally. He is status post cataract repairs bilaterally. He has prescription eyeglasses. Speech is moderately hypophonic, but not dysarthric and minimal drooling is noted. Pupils are equal, round and reactive to light. On extraocular tracking he has mild saccadic breakdown especially on downward gaze. He has no carotid bruits. Chest is clear to auscultation without wheezing or rhonchi noted. Heart sounds are normal without murmurs, rubs or gallops  noted. Abdomen is soft, nontender with normal bowel sounds appreciated. He has no pitting edema in the distal lower extremities  bilaterally. No joint deformities are noted. Skin is warm and dry but he does have several old appearing bruises across the dorsi of his hands. Neurologically: Mental status: The patient is awake, alert and oriented in all 3 spheres. His memory, attention, language and knowledge are appropriate. Cranial nerves are as described under HEENT exam. In addition airway exam reveals a moderately tight airway secondary to a narrow airway entry. Tongue protrudes centrally and palate elevates symmetrically. Motor exam: Normal bulk and strength is noted. Tone is increased with some cogwheeling noted in both upper extremities, L more than R. Tone is also increased in the left lower extremity. Tone is minimally increased on the right side. He has a moderate constant resting tremor in the left upper extremity. He has an intermittent mild resting tremor on the right upper extremity. Fine motor skills with finger taps and hand movements as well as rapid alternating patting is moderately impaired on the left and mild to moderately so on the right. Foot taps are mild to moderately impaired on the right and moderately so on the left. He stands up from the seated position with mild problems and his posture is moderately stooped. He walks with decreased arm swing bilaterally. He has a fairly good stride length but decreased pace. He turns in 3 steps. His balance is fairly well preserved, but he did have difficulty turning and was insecure for just a moment. Romberg is negative. Reflexes are 1+ in the UEs and absent in the LEs. Sensory exam is unchanged from before and normal to all modalities tested. Cerebellar testing shows no dysmetria or intention tremor on finger to nose testing. He has no truncal or gait ataxia.   Assessment and Plan:   In summary, [name] is a very pleasant 81 year old  male with a history of left-sided predominant Parkinson's disease, complicated by chronic constipation, severe mixed sleep apnea which is treated well with BiPAP therapy and chronic back pain for which he had surgery on 12/04/2013 with good success. He is still in physical therapy. He is compliant with BiPAP therapy. I'm pleased with his results. He is advised to continue using BiPAP regularly and increase his water intake and tweak the humidifier setting to see if his thick mucus improves. I think this is because of a combination of excess salivation due to his Parkinson's disease and the airway pressure from his BiPAP that causes his saliva to thicken by the end of the night. He is advised to increase his Sinemet to 1 pill 4 times a day but reminded to be more mindful of the dose timings. To that end, I would like to increase Sinemet to 1 pill 4 times a day at 7, 11, 3 PM and 8 PM. We will keep Mirapex at 3 times a day. He is advised to turn slowly. We talked about potential side effects with the increase in Sinemet in particular, sedation and local blood pressure potentially. I will see him back in about 4 months, sooner if needed. I answered all their questions today and the patient and his wife were in agreement.

## 2014-03-03 NOTE — Patient Instructions (Signed)
I think your Parkinson's disease has remained fairly stable, which is reassuring. Nevertheless, as you know, this disease does progress with time. It can affect your balance, your memory, your mood, your bowel and bladder function, your posture, balance and walking. Overall you are doing fairly well but I do want to suggest a few things today:  Remember to drink plenty of fluid, eat healthy meals and do not skip any meals. Try to eat protein with a every meal and eat a healthy snack such as fruit or nuts in between meals. Try to keep a regular sleep-wake schedule and try to exercise daily, particularly in the form of walking, 20-30 minutes a day, if you can.   Taking your medication on schedule is key.   Try to stay active physically and mentally. Engage in social activities in your community and with your family and try to keep up with current events by reading the newspaper or watching the news. Try to do word puzzles and you may like to do word puzzles and brain games on the computer such as on [email or web address]   As far as your medications are concerned, I would like to suggest that you take your current medication with the following additional changes:  Keep mirapex the same at 3 times a day, Take sinemet 4 times a day, at 7, 11, 3 PM and 8 PM   I would like to see you back in 4 months, sooner if we need to. Please call us with any interim questions, concerns, problems, updates or refill requests.  Our phone number is [phone]. We also have an after hours call service for urgent matters and there is a physician on-call for urgent questions, that cannot wait till the next work day. For any emergencies you know to call 911 or go to the nearest emergency room.

## 2014-03-05 DIAGNOSIS — M25651 Stiffness of right hip, not elsewhere classified: Secondary | ICD-10-CM | POA: Diagnosis not present

## 2014-03-05 DIAGNOSIS — M6281 Muscle weakness (generalized): Secondary | ICD-10-CM | POA: Diagnosis not present

## 2014-03-05 DIAGNOSIS — M25652 Stiffness of left hip, not elsewhere classified: Secondary | ICD-10-CM | POA: Diagnosis not present

## 2014-03-05 DIAGNOSIS — M545 Low back pain: Secondary | ICD-10-CM | POA: Diagnosis not present

## 2014-03-06 DIAGNOSIS — M545 Low back pain: Secondary | ICD-10-CM | POA: Diagnosis not present

## 2014-03-06 DIAGNOSIS — M25652 Stiffness of left hip, not elsewhere classified: Secondary | ICD-10-CM | POA: Diagnosis not present

## 2014-03-06 DIAGNOSIS — M25651 Stiffness of right hip, not elsewhere classified: Secondary | ICD-10-CM | POA: Diagnosis not present

## 2014-03-06 DIAGNOSIS — M6281 Muscle weakness (generalized): Secondary | ICD-10-CM | POA: Diagnosis not present

## 2014-03-09 DIAGNOSIS — M6281 Muscle weakness (generalized): Secondary | ICD-10-CM | POA: Diagnosis not present

## 2014-03-09 DIAGNOSIS — M25651 Stiffness of right hip, not elsewhere classified: Secondary | ICD-10-CM | POA: Diagnosis not present

## 2014-03-09 DIAGNOSIS — M25652 Stiffness of left hip, not elsewhere classified: Secondary | ICD-10-CM | POA: Diagnosis not present

## 2014-03-09 DIAGNOSIS — M545 Low back pain: Secondary | ICD-10-CM | POA: Diagnosis not present

## 2014-03-11 DIAGNOSIS — M25652 Stiffness of left hip, not elsewhere classified: Secondary | ICD-10-CM | POA: Diagnosis not present

## 2014-03-11 DIAGNOSIS — M545 Low back pain: Secondary | ICD-10-CM | POA: Diagnosis not present

## 2014-03-11 DIAGNOSIS — M25651 Stiffness of right hip, not elsewhere classified: Secondary | ICD-10-CM | POA: Diagnosis not present

## 2014-03-11 DIAGNOSIS — M6281 Muscle weakness (generalized): Secondary | ICD-10-CM | POA: Diagnosis not present

## 2014-03-16 DIAGNOSIS — M545 Low back pain: Secondary | ICD-10-CM | POA: Diagnosis not present

## 2014-03-16 DIAGNOSIS — M25652 Stiffness of left hip, not elsewhere classified: Secondary | ICD-10-CM | POA: Diagnosis not present

## 2014-03-16 DIAGNOSIS — M25651 Stiffness of right hip, not elsewhere classified: Secondary | ICD-10-CM | POA: Diagnosis not present

## 2014-03-16 DIAGNOSIS — M6281 Muscle weakness (generalized): Secondary | ICD-10-CM | POA: Diagnosis not present

## 2014-03-17 DIAGNOSIS — M25651 Stiffness of right hip, not elsewhere classified: Secondary | ICD-10-CM | POA: Diagnosis not present

## 2014-03-17 DIAGNOSIS — M6281 Muscle weakness (generalized): Secondary | ICD-10-CM | POA: Diagnosis not present

## 2014-03-17 DIAGNOSIS — M25652 Stiffness of left hip, not elsewhere classified: Secondary | ICD-10-CM | POA: Diagnosis not present

## 2014-03-17 DIAGNOSIS — M545 Low back pain: Secondary | ICD-10-CM | POA: Diagnosis not present

## 2014-03-19 DIAGNOSIS — M4806 Spinal stenosis, lumbar region: Secondary | ICD-10-CM | POA: Diagnosis not present

## 2014-03-19 DIAGNOSIS — M4316 Spondylolisthesis, lumbar region: Secondary | ICD-10-CM | POA: Diagnosis not present

## 2014-03-19 DIAGNOSIS — Z6829 Body mass index (BMI) 29.0-29.9, adult: Secondary | ICD-10-CM | POA: Diagnosis not present

## 2014-03-20 DIAGNOSIS — M545 Low back pain: Secondary | ICD-10-CM | POA: Diagnosis not present

## 2014-03-20 DIAGNOSIS — M6281 Muscle weakness (generalized): Secondary | ICD-10-CM | POA: Diagnosis not present

## 2014-03-20 DIAGNOSIS — M25651 Stiffness of right hip, not elsewhere classified: Secondary | ICD-10-CM | POA: Diagnosis not present

## 2014-03-20 DIAGNOSIS — M25652 Stiffness of left hip, not elsewhere classified: Secondary | ICD-10-CM | POA: Diagnosis not present

## 2014-03-24 DIAGNOSIS — M25652 Stiffness of left hip, not elsewhere classified: Secondary | ICD-10-CM | POA: Diagnosis not present

## 2014-03-24 DIAGNOSIS — M25651 Stiffness of right hip, not elsewhere classified: Secondary | ICD-10-CM | POA: Diagnosis not present

## 2014-03-24 DIAGNOSIS — M6281 Muscle weakness (generalized): Secondary | ICD-10-CM | POA: Diagnosis not present

## 2014-03-24 DIAGNOSIS — M545 Low back pain: Secondary | ICD-10-CM | POA: Diagnosis not present

## 2014-03-25 DIAGNOSIS — M25652 Stiffness of left hip, not elsewhere classified: Secondary | ICD-10-CM | POA: Diagnosis not present

## 2014-03-25 DIAGNOSIS — M545 Low back pain: Secondary | ICD-10-CM | POA: Diagnosis not present

## 2014-03-25 DIAGNOSIS — M6281 Muscle weakness (generalized): Secondary | ICD-10-CM | POA: Diagnosis not present

## 2014-03-25 DIAGNOSIS — M25651 Stiffness of right hip, not elsewhere classified: Secondary | ICD-10-CM | POA: Diagnosis not present

## 2014-05-26 DIAGNOSIS — D485 Neoplasm of uncertain behavior of skin: Secondary | ICD-10-CM | POA: Diagnosis not present

## 2014-05-26 DIAGNOSIS — Z85828 Personal history of other malignant neoplasm of skin: Secondary | ICD-10-CM | POA: Diagnosis not present

## 2014-05-26 DIAGNOSIS — L570 Actinic keratosis: Secondary | ICD-10-CM | POA: Diagnosis not present

## 2014-05-26 DIAGNOSIS — D1801 Hemangioma of skin and subcutaneous tissue: Secondary | ICD-10-CM | POA: Diagnosis not present

## 2014-05-26 DIAGNOSIS — C44519 Basal cell carcinoma of skin of other part of trunk: Secondary | ICD-10-CM | POA: Diagnosis not present

## 2014-05-26 DIAGNOSIS — L821 Other seborrheic keratosis: Secondary | ICD-10-CM | POA: Diagnosis not present

## 2014-06-01 ENCOUNTER — Other Ambulatory Visit (HOSPITAL_COMMUNITY): Payer: Self-pay | Admitting: Internal Medicine

## 2014-06-01 DIAGNOSIS — I714 Abdominal aortic aneurysm, without rupture, unspecified: Secondary | ICD-10-CM

## 2014-06-05 ENCOUNTER — Ambulatory Visit (HOSPITAL_COMMUNITY)
Admission: RE | Admit: 2014-06-05 | Discharge: 2014-06-05 | Disposition: A | Discharge status: Home / Self Care | Payer: Medicare Other | Source: Ambulatory Visit | Attending: Internal Medicine | Admitting: Internal Medicine

## 2014-06-05 DIAGNOSIS — N281 Cyst of kidney, acquired: Secondary | ICD-10-CM | POA: Diagnosis not present

## 2014-06-05 DIAGNOSIS — I714 Abdominal aortic aneurysm, without rupture, unspecified: Secondary | ICD-10-CM

## 2014-06-05 DIAGNOSIS — K868 Other specified diseases of pancreas: Secondary | ICD-10-CM | POA: Diagnosis not present

## 2014-07-09 ENCOUNTER — Ambulatory Visit (INDEPENDENT_AMBULATORY_CARE_PROVIDER_SITE_OTHER): Payer: Medicare Other | Admitting: Neurology

## 2014-07-09 ENCOUNTER — Encounter: Payer: Self-pay | Admitting: Neurology

## 2014-07-09 VITALS — BP 115/58 | HR 80 | Resp 16 | Ht 71.0 in | Wt 200.0 lb

## 2014-07-09 DIAGNOSIS — G4733 Obstructive sleep apnea (adult) (pediatric): Secondary | ICD-10-CM

## 2014-07-09 DIAGNOSIS — G4731 Primary central sleep apnea: Secondary | ICD-10-CM | POA: Diagnosis not present

## 2014-07-09 DIAGNOSIS — M48061 Spinal stenosis, lumbar region without neurogenic claudication: Secondary | ICD-10-CM

## 2014-07-09 DIAGNOSIS — M4806 Spinal stenosis, lumbar region: Secondary | ICD-10-CM | POA: Diagnosis not present

## 2014-07-09 DIAGNOSIS — G20 Parkinson's disease: Secondary | ICD-10-CM | POA: Diagnosis not present

## 2014-07-09 DIAGNOSIS — M7989 Other specified soft tissue disorders: Secondary | ICD-10-CM | POA: Diagnosis not present

## 2014-07-09 NOTE — Progress Notes (Signed)
Subjective:    Patient ID: [name] is a 81 y.o. male.  HPI    Interim history:   [name] is a very pleasant 81 year old right-handed gentleman with an underlying medical history of hypertension, hyperlipidemia, history of cancer, ex-smoker, and complex sleep apnea on BiPAP ST, who presents for followup consultation off his left-sided predominant Parkinson's disease as well as his sleep apnea. [name] is accompanied by his wife again [name]. I last saw him on 03/03/2014, at which time [name] reported experiencing thick mucus first thing in the morning. His tremor was worse per wife. [name] received new supplies recently. [name] has some thick mucus first thing in the morning. His tremor has become worse per wife. His back surgery in November 2015 had relieved much of his pain. [name] was no longer on pain medication. [name] was taking stool softeners for his constipation. I suggested [name] continue with Mirapex 3 times a day but I did ask him to increase Sinemet to 4 times a day.  [name], 07/09/2014: No reviewed his BiPAP compliance data from 06/07/2014 through 07/06/2014 which is a total of 30 days during which time [name] used his machine 29 days with percent used days greater than 4 hours at 87%, indicating very good compliance with an average usage of 5 hours and 2 minutes, residual AHI low at 1.6 per hour, leak low with the 95th percentile at 7.6 L/m on a pressure of 13/9 with a rate of 10.  [name], 07/09/2014: [name] reports feeling stable for the most part. [name] has not had any recent falls. [name] bruises easily. [name] feels more fatigued. [name] has no significant low back pain. [name] no longer takes narcotic pain medicine. Unfortunately, his wife wast involved in a car accident as a passenger. She sustained 7 broken ribs. She had to be in the ICU and had some complications during the hospital stay. Nevertheless, they have been able to overcome this recent stress. [name] uses his BiPAP machine regularly. [name] skip one day because [name] felt [name]  had a stomach bug. [name] feels [name] is sleeping well. [name] feels that the increase in Sinemet helped his trembling. [name] seems to tolerate it fine. [name] denies any major mood or memory issues.  Previously:   I saw him on 12/02/2013, at which time [name] was compliant with BiPAP ST. [name] had spine surgery under Dr. Cyndy Freeze on 12/04/2013 which went well. [name] is in physical therapy. [name] had an L spine MRI on 11/06/13: Degenerative lumbar spondylosis with multilevel disc disease and facet disease. There is bilateral lateral recess and bilateral foraminal stenosis at L2-3 and L3-4. The most significant level however is L4-5 with there is severe spinal, bilateral lateral recess and foraminal stenosis. We talked about his fluid intake. [name] was still not consistent with his dose timings for his Sinemet and Mirapex.  I reviewed his compliance data from 01/27/2014 through 02/25/2014 which is a total of 30 days during which time [name] used his machine every night with percent used days greater than 4 hours of 87%, indicating very good compliance with an average usage of 5 hours and 6 minutes, residual AHI of 3.5 per hour and leak acceptable with the 95th percentile at 15 L/m.  I saw him on 05/27/2013, at which time [name] reported being compliant with his BiPAP, averaging 4-5 hours each night. [name] was still taking his Sinemet with his meals on most days. [name] felt his tremor was a little worse. His wife felt that [name] was otherwise  stable. [name] denied any new cognitive issues, depression, hallucinations, anxiety, delusions. [name] was drinking 3 cups of coffee in the morning and not enough water. I did not increase his medication but asked him to take his Sinemet away from his mealtimes. I considered Linzess for chronic constipation but asked him to increase his water intake and watch constipation symptoms before we use medications.  I reviewed his compliance data from 10/31/2013 through 11/29/2013 which is a total of 30 days during which time [name] used  his machine every night except for 1 night. Percent used days greater than 4 hours was 90% indicating excellent compliance. Residual AHI at 2.5 per hour, leaked low. Pressure at 13/9 with a rate of 10.  I saw him on 11/27/2012, at which time I did not change his medication regimen with the exception of the timing for his Sinemet and Mirapex: I advised him to take it at 7 AM, 11 AM and 4 PM. I also asked him to continue using his BiPAP regularly and take it with him on any vacation trips. [name] was congratulated on his compliance.  I saw him on 07/26/2012 after had his sleep study. [name] has been compliant on BiPAP therapy. I had increased his Sinemet to one pill 3 times a day. [name] was taking it with his meals and did not note any significant improvement in his symptoms, but, then again, [name] was taking it right after his meals. [name] was asking whether [name] should take his BiPAP machine with him to his planned trip to [place].    I first met him on 02/19/2012 after his baseline sleep study. His total AHI was 39.5 per hour based primarily on central apneas. His obstructive AHI was around 15 per hour. His oxyhemoglobin desaturation nadir was 85% and [name] spent 3 hours and 31 minutes below the saturation of 90% for the night. I asked him to come back for a CPAP titration study, possibly BiPAP but [name] wanted to hold off until his appointment in April as [name] was going to be out of town and [name] also wanted to bring his wife for discussion.   I then saw him back on 04/17/2012 and again went over his test results with him and his wife. [name] had been doing well from the PD standpoint. [name] agreed to come back for another sleep study with full night titration. His sleep titration study was on 05/14/2012 and I went over his test results with him and his wife in detail during our visit in July. Sleep efficiency was reduced at 71.5% with a latency to sleep of 2 minutes. Wake after sleep onset was highly elevated at 124 minutes with mild to  moderate sleep fragmentation noted. [name] had increased percentage of REM sleep at 27.2%. [name] had a normal REM latency. There were no significant aortic leg movements. [name] was started on CPAP at a pressure of 5 cm and titrated up to 9 cm of water pressure but [name] had significant central apneas and therefore changed to BiPAP at 11/7 and then switch to ST mode for ongoing central events. His final pressure was 13/9 with a backup rate of 10 on which she had a residual AHI of 0 per hour and supine REM sleep achieved. Oxyhemoglobin desaturation nadir on the final pressure was 90%.   [name] was placed on BiPAP ST at 13/9 cm with a backup rate of 10. I reviewed his compliance from 06/26/2012 through 07/25/2012 (29 days), during which time [name] used it every  day. His average usage was 5 hours and 6 minutes and percent used days greater than 4 hours was 96% indicating excellent compliance. His residual AHI was around 6 indicating fairly reasonable pressure settings. [name] reported tolerating the treatment and [name] changed from a FFM to a nasal mask. [name] denied depression, memory loss, lightheadedness, or hallucinations. I also reviewed more recent compliance data from 09/29/2012 through 10/28/2012 (30 days), during which time [name] used his machine every day. His average usage was 5 hours and 23 minutes, his percent used days greater than 4 hours was 29 days, which is 97%, indicating excellent compliance. His residual AHI was 2.6 per hour indicating an appropriate treatment setting of 13/9 cm with a backup rate of 10 per minute.  His Past Medical History Is Significant For: Past Medical History  Diagnosis Date  . Hypercholesteremia   . Parkinson's disease   . Hypertension   . Sleep disorder   . Ex-smoker   . Cancer     h/o  skin  cancer  . Sleep apnea     ?? sleep apnea....tested 2013 @ [facility]     . GERD (gastroesophageal reflux disease)   . Arthritis   . Lumbar stenosis     His Past Surgical History Is  Significant For: Past Surgical History  Procedure Laterality Date  . Tonsillectomy Bilateral   . Meniscus repair      right knee  2000  . Eye surgery      bilateral  cataracts  . Appendectomy    . Back surgery  11/2013    His Family History Is Significant For: Family History  Problem Relation Age of Onset  . Dementia Mother   . Cancer Brother   . Stroke Brother     His Social History Is Significant For: History   Social History  . Marital Status: Married    Spouse Name: Clinical research associate  . Number of Children: 4  . Years of Education: 16   Occupational History  . Retired     Social History Main Topics  . Smoking status: Former Smoker -- 1.00 packs/day    Types: Cigarettes, Pipe    Start date: 01/22/1951    Quit date: 01/21/1989  . Smokeless tobacco: Never Used  . Alcohol Use: No  . Drug Use: No  . Sexual Activity: Not on file   Other Topics Concern  . None   Social History Narrative    His Allergies Are:  No Known Allergies:   His Current Medications Are:  Outpatient Encounter Prescriptions as of 07/09/2014  Medication Sig  . amLODipine (NORVASC) 10 MG tablet Take 10 mg by mouth daily.  . calcium gluconate 500 MG tablet Take 500 mg by mouth daily.  . carbidopa-levodopa (SINEMET IR) 25-100 MG per tablet Take 1 tablet by mouth 4 (four) times daily.  Mariane Baumgarten Calcium (STOOL SOFTENER PO) Take by mouth 2 (two) times daily.  Marland Kitchen glucosamine-chondroitin 500-400 MG tablet Take 1 tablet by mouth once.  Marland Kitchen ketoconazole (NIZORAL) 2 % cream Apply 1 application topically daily as needed for irritation.   . Multiple Vitamin (MULTIVITAMIN) tablet Take 1 tablet by mouth daily.  Marland Kitchen omeprazole (PRILOSEC OTC) 20 MG tablet Take 20 mg by mouth daily.  . pramipexole (MIRAPEX) 0.5 MG tablet Take 1 tablet (0.5 mg total) by mouth 3 (three) times daily.  Marland Kitchen telmisartan-hydrochlorothiazide (MICARDIS HCT) 80-25 MG per tablet Take 1 tablet by mouth daily.  . vitamin C (ASCORBIC ACID) 500 MG  tablet Take  500 mg by mouth daily.  . [DISCONTINUED] cyclobenzaprine (FLEXERIL) 10 MG tablet Take 1 tablet (10 mg total) by mouth 3 (three) times daily as needed for muscle spasms.  . [DISCONTINUED] oxyCODONE-acetaminophen (ROXICET) 5-325 MG per tablet Take 1 tablet by mouth every 6 (six) hours as needed for severe pain.   No facility-administered encounter medications on file as of 07/09/2014.  :  Review of Systems:  Out of a complete 14 point review of systems, all are reviewed and negative with the exception of these symptoms as listed below:   Review of Systems  All other systems reviewed and are negative.   Objective:  Neurologic Exam  Physical Exam Physical Examination:   Filed Vitals:   07/09/14 1256  BP: 115/58  Pulse: 80  Resp: 16    General Examination: The patient is a very pleasant 82 y.o. male in no acute distress.  HEENT exam: [name] has a moderate degree of nuchal rigidity with a mild lower jaw and lip tremor, which is fairly constant. [name] has a mild to moderately masked facies with decreased eye blink rate. Hearing is mildly impaired. Funduscopic exam is normal bilaterally. [name] is status post cataract repairs bilaterally. [name] has prescription eyeglasses. Speech is moderately hypophonic, but not dysarthric and minimal drooling is noted. Pupils are equal, round and reactive to light. On extraocular tracking [name] has mild saccadic breakdown especially on downward gaze. [name] has no carotid bruits. Chest is clear to auscultation without wheezing or rhonchi noted. Heart sounds are normal with a slight systolic heart murmur (2/6), no rubs or gallops noted. Abdomen is soft, nontender with normal bowel sounds appreciated. [name] has 1+ pitting edema in the distal lower extremities bilaterally, seems new.  No joint deformities are noted. Skin is warm and dry but [name] does have several old appearing bruises across the dorsi of his hands. Neurologically: Mental status: The patient is awake,  alert and oriented in all 3 spheres. His memory, attention, language and knowledge are appropriate. Cranial nerves are as described under HEENT exam. In addition airway exam reveals a moderately tight airway secondary to a narrow airway entry. Tongue protrudes centrally and palate elevates symmetrically. Motor exam: Normal bulk and strength is noted. Tone is increased with some cogwheeling noted in both upper extremities, L more than R. Tone is also increased in the left lower extremity. Tone is minimally increased on the right side. [name] has a moderate constant resting tremor in the left upper extremity. [name] has an intermittent mild resting tremor on the right upper extremity. Fine motor skills with finger taps and hand movements as well as rapid alternating patting is moderately impaired on the left and moderately so on the right. Foot taps are mild to moderately impaired on the right and moderately so on the left. [name] stands up from the seated position with mild problems and his posture is moderately stooped. [name] walks with decreased arm swing bilaterally. [name] has a fairly good stride length but decreased pace and turns in 3 steps. His balance is fairly well preserved, but [name] did have difficulty turning and was insecure for just a moment. Romberg is negative. Reflexes are 1+ in the UEs and absent in the LEs. Sensory exam is unchanged from before and normal to all modalities tested. Cerebellar testing shows no dysmetria or intention tremor on finger to nose testing. [name] has no truncal or gait ataxia.   Assessment and Plan:   In summary, [name] is a very pleasant 81 year old  male with a history of left-sided predominant Parkinson's disease, complicated by chronic constipation, severe mixed sleep apnea which is treated well with BiPAP therapy and chronic back pain for which [name] had surgery on 12/04/2013 with good success. [name] is tolerating the Sinemet which is currently 4 times a day. [name] has developed mild  lower extremity swelling which seems new to my exam. [name] has seen cardiology once before. [name] is supposed to see his cardiologist back in a year which would be in October. In November [name] supposed to see his neurosurgeon. [name] is compliant with BiPAP therapy and is congratulated on his treatment adherence. I would like to monitor his lower chimney swelling. [name] is encouraged to discuss this with his primary care physician as well. Sometimes a dopamine agonists can cause edema. [name] is on low-dose Mirapex and has been on it for a while but we cannot exclude that this is causing his swelling. [name] is advised to keep his legs elevated when sedentary and start using compression socks, knee highs. His wife indicates that [name] will probably not use compression socks.  At this juncture, I suggested we continue with the current medication regimen and monitor his symptoms. I would like to see him back in 6 months, sooner if needed. I answered all the questions [name] and they were in agreement. They are encouraged to call with any interim questions or concerns.  I spent 20 minutes in total face-to-face time with the patient, more than 50% of which was spent in counseling and coordination of care, reviewing test results, reviewing medication and discussing or reviewing the diagnosis of PD and sleep apnea, the prognosis and treatment options.

## 2014-07-09 NOTE — Patient Instructions (Addendum)
Keep up the good work with your BiPAP machine!  Continue with your exercises, walk regularly.   Monitor your leg swelling. Talk to Dr. Nevada Crane about it too. Possibly, it could be from the mirapex.

## 2014-07-14 DIAGNOSIS — D3131 Benign neoplasm of right choroid: Secondary | ICD-10-CM | POA: Diagnosis not present

## 2014-07-27 ENCOUNTER — Telehealth: Payer: Self-pay

## 2014-07-27 NOTE — Telephone Encounter (Signed)
Noted and agree. 

## 2014-07-27 NOTE — Telephone Encounter (Signed)
Pt called and said Dr. Luan Pulling said he was due for his next colonoscopy this year.  He is not having any rectal bleeding, no hemorrhoids, no constipation or any other GI problems. He is on our recall for Dec 2016 and he said he will just wait til then. He is aware we will contact him at that time.  He will call if he has problems before then.

## 2014-08-11 DIAGNOSIS — I10 Essential (primary) hypertension: Secondary | ICD-10-CM | POA: Diagnosis not present

## 2014-08-11 DIAGNOSIS — E782 Mixed hyperlipidemia: Secondary | ICD-10-CM | POA: Diagnosis not present

## 2014-08-11 DIAGNOSIS — R7301 Impaired fasting glucose: Secondary | ICD-10-CM | POA: Diagnosis not present

## 2014-08-18 DIAGNOSIS — R7301 Impaired fasting glucose: Secondary | ICD-10-CM | POA: Diagnosis not present

## 2014-08-18 DIAGNOSIS — I10 Essential (primary) hypertension: Secondary | ICD-10-CM | POA: Diagnosis not present

## 2014-08-18 DIAGNOSIS — E785 Hyperlipidemia, unspecified: Secondary | ICD-10-CM | POA: Diagnosis not present

## 2014-08-18 DIAGNOSIS — R944 Abnormal results of kidney function studies: Secondary | ICD-10-CM | POA: Diagnosis not present

## 2014-11-19 DIAGNOSIS — M4316 Spondylolisthesis, lumbar region: Secondary | ICD-10-CM | POA: Diagnosis not present

## 2014-11-19 DIAGNOSIS — Z6828 Body mass index (BMI) 28.0-28.9, adult: Secondary | ICD-10-CM | POA: Diagnosis not present

## 2014-11-19 DIAGNOSIS — I10 Essential (primary) hypertension: Secondary | ICD-10-CM | POA: Diagnosis not present

## 2014-11-23 DIAGNOSIS — Z23 Encounter for immunization: Secondary | ICD-10-CM | POA: Diagnosis not present

## 2014-11-23 DIAGNOSIS — I10 Essential (primary) hypertension: Secondary | ICD-10-CM | POA: Diagnosis not present

## 2014-11-23 DIAGNOSIS — E782 Mixed hyperlipidemia: Secondary | ICD-10-CM | POA: Diagnosis not present

## 2014-11-23 DIAGNOSIS — R7301 Impaired fasting glucose: Secondary | ICD-10-CM | POA: Diagnosis not present

## 2014-11-25 DIAGNOSIS — R7301 Impaired fasting glucose: Secondary | ICD-10-CM | POA: Diagnosis not present

## 2014-11-25 DIAGNOSIS — E782 Mixed hyperlipidemia: Secondary | ICD-10-CM | POA: Diagnosis not present

## 2014-11-25 DIAGNOSIS — I714 Abdominal aortic aneurysm, without rupture: Secondary | ICD-10-CM | POA: Diagnosis not present

## 2014-11-25 DIAGNOSIS — I10 Essential (primary) hypertension: Secondary | ICD-10-CM | POA: Diagnosis not present

## 2014-11-25 DIAGNOSIS — G20 Parkinson's disease: Secondary | ICD-10-CM | POA: Diagnosis not present

## 2014-11-26 ENCOUNTER — Other Ambulatory Visit (HOSPITAL_COMMUNITY): Payer: Self-pay | Admitting: Internal Medicine

## 2014-11-26 DIAGNOSIS — L570 Actinic keratosis: Secondary | ICD-10-CM | POA: Diagnosis not present

## 2014-11-26 DIAGNOSIS — D2272 Melanocytic nevi of left lower limb, including hip: Secondary | ICD-10-CM | POA: Diagnosis not present

## 2014-11-26 DIAGNOSIS — I714 Abdominal aortic aneurysm, without rupture, unspecified: Secondary | ICD-10-CM

## 2014-11-26 DIAGNOSIS — D3612 Benign neoplasm of peripheral nerves and autonomic nervous system, upper limb, including shoulder: Secondary | ICD-10-CM | POA: Diagnosis not present

## 2014-11-26 DIAGNOSIS — L821 Other seborrheic keratosis: Secondary | ICD-10-CM | POA: Diagnosis not present

## 2014-11-26 DIAGNOSIS — Z85828 Personal history of other malignant neoplasm of skin: Secondary | ICD-10-CM | POA: Diagnosis not present

## 2014-11-26 DIAGNOSIS — D3617 Benign neoplasm of peripheral nerves and autonomic nervous system of trunk, unspecified: Secondary | ICD-10-CM | POA: Diagnosis not present

## 2014-11-26 DIAGNOSIS — D1801 Hemangioma of skin and subcutaneous tissue: Secondary | ICD-10-CM | POA: Diagnosis not present

## 2014-11-26 DIAGNOSIS — L853 Xerosis cutis: Secondary | ICD-10-CM | POA: Diagnosis not present

## 2014-12-01 ENCOUNTER — Encounter: Payer: Self-pay | Admitting: Internal Medicine

## 2014-12-02 ENCOUNTER — Ambulatory Visit (HOSPITAL_COMMUNITY): Admission: RE | Admit: 2014-12-02 | Payer: Medicare Other | Source: Ambulatory Visit

## 2014-12-08 ENCOUNTER — Ambulatory Visit (HOSPITAL_COMMUNITY)
Admission: RE | Admit: 2014-12-08 | Discharge: 2014-12-08 | Disposition: A | Discharge status: Home / Self Care | Payer: Medicare Other | Source: Ambulatory Visit | Attending: Internal Medicine | Admitting: Internal Medicine

## 2014-12-08 DIAGNOSIS — I714 Abdominal aortic aneurysm, without rupture, unspecified: Secondary | ICD-10-CM

## 2014-12-28 ENCOUNTER — Ambulatory Visit (INDEPENDENT_AMBULATORY_CARE_PROVIDER_SITE_OTHER): Payer: Medicare Other | Admitting: Neurology

## 2014-12-28 ENCOUNTER — Encounter: Payer: Self-pay | Admitting: Neurology

## 2014-12-28 ENCOUNTER — Encounter (INDEPENDENT_AMBULATORY_CARE_PROVIDER_SITE_OTHER): Payer: Self-pay

## 2014-12-28 VITALS — BP 136/60 | HR 70 | Resp 16 | Ht 71.0 in | Wt 196.0 lb

## 2014-12-28 DIAGNOSIS — G4733 Obstructive sleep apnea (adult) (pediatric): Secondary | ICD-10-CM | POA: Diagnosis not present

## 2014-12-28 DIAGNOSIS — G4731 Primary central sleep apnea: Secondary | ICD-10-CM | POA: Diagnosis not present

## 2014-12-28 DIAGNOSIS — G20 Parkinson's disease: Secondary | ICD-10-CM | POA: Diagnosis not present

## 2014-12-28 MED ORDER — PRAMIPEXOLE DIHYDROCHLORIDE 0.5 MG PO TABS: 0.5000 mg | ORAL_TABLET | Freq: Three times a day (TID) | ORAL | Status: DC

## 2014-12-28 MED ORDER — CARBIDOPA-LEVODOPA 25-100 MG PO TABS: ORAL_TABLET | ORAL | Status: DC

## 2014-12-28 NOTE — Patient Instructions (Addendum)
We will increase you Sinemet to 1 pill 5 times a day: 6 am, 10 am, 2 pm, 6 pm and 9 pm.  We will continue Mirapex 3 times a day: 6 am, 2 pm and 6 pm.  We will make a referral to Physical therapy at the neuro rehab center here.  Keep up the good work with your BiPAP! I will see you back in 6 months, sooner if needed.

## 2014-12-28 NOTE — Progress Notes (Signed)
Subjective:    Patient ID: [name] is a 81 y.o. male.  HPI     Interim history:   [name] is a very pleasant 81-year-old right-handed gentleman with an underlying medical history of hypertension, hyperlipidemia, history of cancer, ex-smoker, and complex sleep apnea on BiPAP ST, who presents for followup consultation off his left-sided predominant Parkinson's disease as well as his sleep apnea. He is accompanied by his wife again today. I last saw him on 07/09/2014, at whicht time he reported feeling stable for the most part. He had not experienced any drastic changes in his symptoms. He reported no falls. He had no significant low back pain and no longer was taking any narcotics. Unfortunately, his wife was involved in a car accident and sustained broken ribs and had to be in the ICU for some time. He was using his BiPAP machine regularly. He reported one skip night because of a stomach bug. Overall, he felt he was sleeping well. He felt that the increase in Sinemet helped his trembling. He was tolerating his medication. He had no new major mood or memory issues.   Today, 12/28/2014: I reviewed his BiPAP compliance data from 11/23/2014 through 12/22/2014 which is a total of 30 days during which time he used his machine every night with percent used days greater than 4 hours at 93%, indicating excellent compliance with an average usage of 4 hours and 54 minutes, residual AHI low at 1.7 per hour, leaked low with the 95th percentile at 7.4 L/m on a pressure of 13/9 cm with a backup rate of 10.   Today, 12/28/2014: He reports doing well generally speaking. He had no recent falls, tremor somewhat worse and dexterity worse. He has noted some trouble when he turns at times. He has not been evaluated by physical therapy. He has sometimes trouble cutting his food but no trouble with day-to-day activities. He continues to take Sinemet 4 times a day, usually at 6 AM, 10 AM, 2 PM and 8 PM. He does  have a tendency to doze off right after dinner and then eventually goes to bed. Sometimes he takes a nap after lunch. Has FU with cardiology next week, with Dr. K. He has issues with constipation and takes a stool softener and prunes. His wife adds that they may need to start him on MiraLAX. He has a colonoscopy scheduled for 01/04/2015.  Previously:   I saw him on 03/03/2014, at which time he reported experiencing thick mucus first thing in the morning. His tremor was worse per wife. He received new supplies recently. He has some thick mucus first thing in the morning. His tremor has become worse per wife. His back surgery in November 2015 had relieved much of his pain. He was no longer on pain medication. He was taking stool softeners for his constipation. I suggested he continue with Mirapex 3 times a day but I did ask him to increase Sinemet to 4 times a day.  I reviewed his BiPAP compliance data from 06/07/2014 through 07/06/2014 which is a total of 30 days during which time he used his machine 29 days with percent used days greater than 4 hours at 87%, indicating very good compliance with an average usage of 5 hours and 2 minutes, residual AHI low at 1.6 per hour, leak low with the 95th percentile at 7.6 L/m on a pressure of 13/9 with a rate of 10.  I saw him on 12/02/2013, at which time he was compliant   with BiPAP ST. He had spine surgery under Dr. Cabell on 12/04/2013 which went well. He is in physical therapy. He had an L spine MRI on 11/06/13: Degenerative lumbar spondylosis with multilevel disc disease and facet disease. There is bilateral lateral recess and bilateral foraminal stenosis at L2-3 and L3-4. The most significant level however is L4-5 with there is severe spinal, bilateral lateral recess and foraminal stenosis. We talked about his fluid intake. He was still not consistent with his dose timings for his Sinemet and Mirapex.  I reviewed his compliance data from 01/27/2014 through  02/25/2014 which is a total of 30 days during which time he used his machine every night with percent used days greater than 4 hours of 87%, indicating very good compliance with an average usage of 5 hours and 6 minutes, residual AHI of 3.5 per hour and leak acceptable with the 95th percentile at 15 L/m.  I saw him on 05/27/2013, at which time he reported being compliant with his BiPAP, averaging 4-5 hours each night. He was still taking his Sinemet with his meals on most days. He felt his tremor was a little worse. His wife felt that he was otherwise stable. He denied any new cognitive issues, depression, hallucinations, anxiety, delusions. He was drinking 3 cups of coffee in the morning and not enough water. I did not increase his medication but asked him to take his Sinemet away from his mealtimes. I considered Linzess for chronic constipation but asked him to increase his water intake and watch constipation symptoms before we use medications.  I reviewed his compliance data from 10/31/2013 through 11/29/2013 which is a total of 30 days during which time he used his machine every night except for 1 night. Percent used days greater than 4 hours was 90% indicating excellent compliance. Residual AHI at 2.5 per hour, leaked low. Pressure at 13/9 with a rate of 10.  I saw him on 11/27/2012, at which time I did not change his medication regimen with the exception of the timing for his Sinemet and Mirapex: I advised him to take it at 7 AM, 11 AM and 4 PM. I also asked him to continue using his BiPAP regularly and take it with him on any vacation trips. He was congratulated on his compliance.  I saw him on 07/26/2012 after had his sleep study. He has been compliant on BiPAP therapy. I had increased his Sinemet to one pill 3 times a day. He was taking it with his meals and did not note any significant improvement in his symptoms, but, then again, he was taking it right after his meals. He was asking whether he  should take his BiPAP machine with him to his planned trip to [place].    I first met him on 02/19/2012 after his baseline sleep study. His total AHI was 39.5 per hour based primarily on central apneas. His obstructive AHI was around 15 per hour. His oxyhemoglobin desaturation nadir was 85% and he spent 3 hours and 31 minutes below the saturation of 90% for the night. I asked him to come back for a CPAP titration study, possibly BiPAP but he wanted to hold off until his appointment in April as he was going to be out of town and he also wanted to bring his wife for discussion.   I then saw him back on 04/17/2012 and again went over his test results with him and his wife. He had been doing well from the PD standpoint. He agreed   to come back for another sleep study with full night titration. His sleep titration study was on 05/14/2012 and I went over his test results with him and his wife in detail during our visit in July. Sleep efficiency was reduced at 71.5% with a latency to sleep of 2 minutes. Wake after sleep onset was highly elevated at 124 minutes with mild to moderate sleep fragmentation noted. He had increased percentage of REM sleep at 27.2%. He had a normal REM latency. There were no significant aortic leg movements. He was started on CPAP at a pressure of 5 cm and titrated up to 9 cm of water pressure but he had significant central apneas and therefore changed to BiPAP at 11/7 and then switch to ST mode for ongoing central events. His final pressure was 13/9 with a backup rate of 10 on which she had a residual AHI of 0 per hour and supine REM sleep achieved. Oxyhemoglobin desaturation nadir on the final pressure was 90%.   He was placed on BiPAP ST at 13/9 cm with a backup rate of 10. I reviewed his compliance from 06/26/2012 through 07/25/2012 (29 days), during which time he used it every day. His average usage was 5 hours and 6 minutes and percent used days greater than 4 hours was 96% indicating  excellent compliance. His residual AHI was around 6 indicating fairly reasonable pressure settings. He reported tolerating the treatment and he changed from a FFM to a nasal mask. He denied depression, memory loss, lightheadedness, or hallucinations. I also reviewed more recent compliance data from 09/29/2012 through 10/28/2012 (30 days), during which time he used his machine every day. His average usage was 5 hours and 23 minutes, his percent used days greater than 4 hours was 29 days, which is 97%, indicating excellent compliance. His residual AHI was 2.6 per hour indicating an appropriate treatment setting of 13/9 cm with a backup rate of 10 per minute.  His Past Medical History Is Significant For: Past Medical History  Diagnosis Date  . Hypercholesteremia   . Parkinson's disease (HCC)   . Hypertension   . Sleep disorder   . Ex-smoker   . Cancer (HCC)     h/o  skin  cancer  . Sleep apnea     ?? sleep apnea....tested 2013 @ [facility]     . GERD (gastroesophageal reflux disease)   . Arthritis   . Lumbar stenosis     His Past Surgical History Is Significant For: Past Surgical History  Procedure Laterality Date  . Tonsillectomy Bilateral   . Meniscus repair      right knee  2000  . Eye surgery      bilateral  cataracts  . Appendectomy    . Back surgery  11/2013    His Family History Is Significant For: Family History  Problem Relation Age of Onset  . Dementia Mother   . Cancer Brother   . Stroke Brother     His Social History Is Significant For: Social History   Social History  . Marital Status: Married    Spouse Name: Lovetta  . Number of Children: 4  . Years of Education: 16   Occupational History  . Retired     Social History Main Topics  . Smoking status: Former Smoker -- 1.00 packs/day    Types: Cigarettes, Pipe    Start date: 01/22/1951    Quit date: 01/21/1989  . Smokeless tobacco: Never Used  . Alcohol Use: No  . Drug   Use: No  . Sexual  Activity: Not Asked   Other Topics Concern  . None   Social History Narrative    His Allergies Are:  No Known Allergies:   His Current Medications Are:  Outpatient Encounter Prescriptions as of 12/28/2014  Medication Sig  . amLODipine (NORVASC) 10 MG tablet Take 10 mg by mouth daily.  . calcium gluconate 500 MG tablet Take 500 mg by mouth daily.  . carbidopa-levodopa (SINEMET IR) 25-100 MG per tablet Take 1 tablet by mouth 4 (four) times daily.  Mariane Baumgarten Calcium (STOOL SOFTENER PO) Take by mouth 2 (two) times daily.  Marland Kitchen glucosamine-chondroitin 500-400 MG tablet Take 1 tablet by mouth once.  Marland Kitchen ketoconazole (NIZORAL) 2 % cream Apply 1 application topically daily as needed for irritation.   . Multiple Vitamin (MULTIVITAMIN) tablet Take 1 tablet by mouth daily.  Marland Kitchen omeprazole (PRILOSEC OTC) 20 MG tablet Take 20 mg by mouth daily.  . pramipexole (MIRAPEX) 0.5 MG tablet Take 1 tablet (0.5 mg total) by mouth 3 (three) times daily.  Marland Kitchen telmisartan-hydrochlorothiazide (MICARDIS HCT) 80-25 MG per tablet Take 1 tablet by mouth daily.  . valsartan-hydrochlorothiazide (DIOVAN-HCT) 80-12.5 MG tablet   . vitamin C (ASCORBIC ACID) 500 MG tablet Take 500 mg by mouth daily.   No facility-administered encounter medications on file as of 12/28/2014.  :  Review of Systems:  Out of a complete 14 point review of systems, all are reviewed and negative with the exception of these symptoms as listed below:   Review of Systems  Neurological:       Patient is here for f/u, reports no new concerns.     Objective:  Neurologic Exam  Physical Exam Physical Examination:   Filed Vitals:   12/28/14 1254  BP: 136/60  Pulse: 70  Resp: 16    General Examination: The patient is a very pleasant 81 y.o. male in no acute distress.  HEENT exam: He has a moderate degree of nuchal rigidity with a mild lower jaw and lip tremor, which is fairly constant. He has a mild to moderately masked facies with decreased  eye blink rate. Hearing is mildly impaired. Funduscopic exam is normal bilaterally. He is status post cataract repairs bilaterally. He has prescription eyeglasses. Speech is moderately hypophonic, but not dysarthric and minimal drooling is noted, stable. Pupils are equal, round and reactive to light. On extraocular tracking he has mild saccadic breakdown especially on downward gaze. He has no carotid bruits. Chest is clear to auscultation without wheezing or rhonchi noted. Heart sounds are normal with a slight systolic heart murmur (2/6), no rubs or gallops noted, unchanged. Abdomen is soft, nontender with normal bowel sounds appreciated. He has trace pitting edema in the distal lower extremities bilaterally, seems new.  No joint deformities are noted. Skin is warm and dry but he does have several old appearing bruises across the dorsi of his hands. Neurologically: Mental status: The patient is awake, alert and oriented in all 3 spheres. His memory, attention, language and knowledge are appropriate. Cranial nerves are as described under HEENT exam. In addition airway exam reveals a moderately tight airway secondary to a narrow airway entry. Tongue protrudes centrally and palate elevates symmetrically. Motor exam: Normal bulk and strength is noted. Tone is increased with some cogwheeling noted in both upper extremities, L more than R. Tone is also increased in the left lower extremity. Tone is mildly increased on the right side. He has a moderate constant resting tremor in the left  upper extremity. He has an intermittent mild resting tremor on the right upper extremity. Fine motor skills with finger taps and hand movements as well as rapid alternating patting is moderately to severely impaired on the left and moderately so on the right. Foot taps are moderately impaired on the right and moderately to severely so on the left. He stands up from the seated position with mild problems and his posture is moderately  stooped. He walks with decreased arm swing bilaterally. He has a fairly good stride length but decreased pace and turns in 3 steps. His balance is fairly well preserved, but he did have mild insecurity with turns. Romberg is negative. Reflexes are 1+ in the UEs and absent in the LEs. Sensory exam is unchanged from before and normal to all modalities tested. Cerebellar testing shows no dysmetria or intention tremor on finger to nose testing. He has no truncal or gait ataxia.   Assessment and Plan:   In summary, Ashton D Sima is a very pleasant 81-year old male with an underlying medical history of hypertension, hyperlipidemia, history of cancer, ex-smoker, and complex sleep apnea on BiPAP ST, who presents for followup consultation off his left-sided predominant Parkinson's disease as well as his sleep apnea. His history is complicated by chronic constipation, severe mixed sleep apnea which is treated well with BiPAP therapy and chronic back pain for which he had surgery on 12/04/2013 with good success. He is tolerating the Sinemet which is currently 4 times a day. He has in the interim developed mild lower extremity swelling which is a little better today on my exam. He has an appointment pending with cardiology next week and a colonoscopy scheduled for 01/04/2015. I suggested we try him on a fifth dose of Sinemet with the expectation that his tremor and dexterity and stiffness may improve but not necessarily his posture or balance. To that end, I advised him to increase Sinemet to 1 pill 5 times a day, at 6, 10, 2 PM, 6 PM and 9 PM. We will keep Mirapex the same, 3 times a day but he is advised to take it at 6 AM, 2 PM and 6 PM. He is furthermore advised to continue with BiPAP therapy regularly and is commended for his compliance. I suggested a physical therapy evaluation and treatment if necessary. He is agreeable. I made a referral to our neuro rehabilitation unit next door for outpatient physical therapy  evaluation and treatment as necessary.  I renewed his prescriptions today. I would like to see him back in 6 months, sooner if needed. I answered all the questions today and they were in agreement. They are encouraged to call with any interim questions or concerns.  I spent 25 minutes in total face-to-face time with the patient, more than 50% of which was spent in counseling and coordination of care, reviewing test results, reviewing medication and discussing or reviewing the diagnosis of PD and sleep apnea, the prognosis and treatment options. 

## 2014-12-31 ENCOUNTER — Ambulatory Visit: Payer: Medicare Other | Admitting: Nurse Practitioner

## 2015-01-01 ENCOUNTER — Ambulatory Visit (INDEPENDENT_AMBULATORY_CARE_PROVIDER_SITE_OTHER): Payer: Medicare Other | Admitting: Cardiovascular Disease

## 2015-01-01 ENCOUNTER — Encounter: Payer: Self-pay | Admitting: Cardiovascular Disease

## 2015-01-01 VITALS — BP 128/74 | HR 86 | Ht 71.0 in | Wt 199.0 lb

## 2015-01-01 DIAGNOSIS — I714 Abdominal aortic aneurysm, without rupture, unspecified: Secondary | ICD-10-CM

## 2015-01-01 DIAGNOSIS — I350 Nonrheumatic aortic (valve) stenosis: Secondary | ICD-10-CM | POA: Diagnosis not present

## 2015-01-01 DIAGNOSIS — I10 Essential (primary) hypertension: Secondary | ICD-10-CM | POA: Diagnosis not present

## 2015-01-01 NOTE — Patient Instructions (Addendum)
Your physician wants you to follow-up in: 1 year with Dr Virgina Jock will receive a reminder letter in the mail two months in advance. If you don't receive a letter, please call our office to schedule the follow-up appointment.   Your physician recommends that you continue on your current medications as directed. Please refer to the Current Medication list given to you today.   If you need a refill on your cardiac medications before your next appointment, please call your pharmacy.    Your physician has requested that you have an echocardiogram IN 6 MONTHS . Echocardiography is a painless test that uses sound waves to create images of your heart. It provides your doctor with information about the size and shape of your heart and how well your heart's chambers and valves are working. This procedure takes approximately one hour. There are no restrictions for this procedure.     Thank you for choosing [name] !

## 2015-01-01 NOTE — Progress Notes (Signed)
Patient ID: [name], male   DOB: 1931/12/18, 81 y.o.   MRN: [id number]      SUBJECTIVE: The patient returns for follow-up of aortic stenosis and abdominal aortic aneurysm.   An ultrasound on 12/08/14 showed a mid abdominal aortic aneurysm measuring 3.7 cm.  He also has a degree of emphysema and a chest CT on 03/27/11 showed right middle lobe and left lower lobe scarring.  ECG performed in the office today demonstrate normal sinus rhythm with no ischemic ST segment or T-wave abnormalities.  A review of blood work performed on 11/23/14 showed total cholesterol 160, triglycerides 50, HDL 49, LDL 101.  He said he feels well and denies leg swelling, chest pain, dizziness, and shortness of breath.   He is looking forward to family visiting for Christmas. He has a son coming in from [place].    Review of Systems: As per "subjective", otherwise negative.  No Known Allergies  Current Outpatient Prescriptions  Medication Sig Dispense Refill  . amLODipine (NORVASC) 10 MG tablet Take 10 mg by mouth daily.    . calcium gluconate 500 MG tablet Take 500 mg by mouth daily.    . carbidopa-levodopa (SINEMET IR) 25-100 MG tablet Take 1 pill 5 times a day: 6, 10, 2PM, 6PM, and 9 PM 450 tablet 3  . Docusate Calcium (STOOL SOFTENER PO) Take by mouth 2 (two) times daily.    Marland Kitchen glucosamine-chondroitin 500-400 MG tablet Take 1 tablet by mouth once.    Marland Kitchen ketoconazole (NIZORAL) 2 % cream Apply 1 application topically daily as needed for irritation.     . Multiple Vitamin (MULTIVITAMIN) tablet Take 1 tablet by mouth daily.    Marland Kitchen omeprazole (PRILOSEC OTC) 20 MG tablet Take 20 mg by mouth daily.    . pramipexole (MIRAPEX) 0.5 MG tablet Take 1 tablet (0.5 mg total) by mouth 3 (three) times daily. 270 tablet 3  . telmisartan-hydrochlorothiazide (MICARDIS HCT) 80-25 MG per tablet Take 1 tablet by mouth daily.    . valsartan-hydrochlorothiazide (DIOVAN-HCT) 80-12.5 MG tablet     . vitamin C (ASCORBIC  ACID) 500 MG tablet Take 500 mg by mouth daily.     No current facility-administered medications for this visit.    Past Medical History  Diagnosis Date  . Hypercholesteremia   . Parkinson's disease ([facility])   . Hypertension   . Sleep disorder   . Ex-smoker   . Cancer [facility])     h/o  skin  cancer  . Sleep apnea     ?? sleep apnea....tested 2013 @ [facility]     . GERD (gastroesophageal reflux disease)   . Arthritis   . Lumbar stenosis     Past Surgical History  Procedure Laterality Date  . Tonsillectomy Bilateral   . Meniscus repair      right knee  2000  . Eye surgery      bilateral  cataracts  . Appendectomy    . Back surgery  11/2013    Social History   Social History  . Marital Status: Married    Spouse Name: Clinical research associate  . Number of Children: 4  . Years of Education: 16   Occupational History  . Retired     Social History Main Topics  . Smoking status: Former Smoker -- 1.00 packs/day    Types: Cigarettes, Pipe    Start date: 01/22/1951    Quit date: 01/21/1989  . Smokeless tobacco: Never Used  . Alcohol Use: No  . Drug Use: No  .  Sexual Activity: Not on file   Other Topics Concern  . Not on file   Social History Narrative     Filed Vitals:   01/01/15 0837  BP: 128/74  Pulse: 86  Height: 5\' 11"  (1.803 m)  Weight: 199 lb (90.266 kg)  SpO2: 91%    PHYSICAL EXAM General: NAD Neck: No JVD, no thyromegaly or thyroid nodule.  Lungs: Dry crackles at bases b/l. CV: Nondisplaced PMI. Regular rate and rhythm, normal S1/S2, no S3/S4, III/VI crescendo-decrescendo systolic murmur heard throughout the precordium. No peripheral edema. No carotid bruit. Mild venous varicosities b/l. Abdomen: Soft, nontender,  no distention.  Skin: Intact without lesions or rashes.  Neurologic: Alert and oriented x 3. Tremor noted in hands. Psych: Normal affect. Extremities: No clubbing or cyanosis.  HEENT: Normal.   ECG: Most recent ECG  reviewed.      ASSESSMENT AND PLAN: 1. Aortic stenosis: Mild with mean gradient 17 mmHg on 09/23/13. Asymptomatic. Will monitor clinically and obtain a surveillance echocardiogram in 6 months.  2. Essential HTN: Controlled on amlodipine 10 mg and Micardis-HCTZ 80-25 mg.  3. Hyperlipidemia: Not on statin therapy.  4. Sleep apnea: On BiPAP.  5. Orthostatic hypotension: No further episodes. He likely has a degree of autonomic dysfunction given his Parkinson's Disease.  6. Parkinson's disease: On Sinemet.  7. AAA: 3.7 cm x 3 cm. Repeat US in 2 years.  Dispo: f/u 1 year.   Kate Sable, M.D., F.A.C.C.

## 2015-01-04 ENCOUNTER — Other Ambulatory Visit: Payer: Self-pay

## 2015-01-04 ENCOUNTER — Encounter: Payer: Self-pay | Admitting: Gastroenterology

## 2015-01-04 ENCOUNTER — Ambulatory Visit (INDEPENDENT_AMBULATORY_CARE_PROVIDER_SITE_OTHER): Payer: Medicare Other | Admitting: Gastroenterology

## 2015-01-04 VITALS — BP 126/80 | HR 80 | Temp 97.3°F | Ht 71.0 in | Wt 201.6 lb

## 2015-01-04 DIAGNOSIS — Z86010 Personal history of colonic polyps: Secondary | ICD-10-CM

## 2015-01-04 MED ORDER — PEG 3350-KCL-NA BICARB-NACL 420 G PO SOLR: 4000.0000 mL | ORAL | Status: DC

## 2015-01-04 NOTE — Progress Notes (Signed)
CC'ED TO PCP 

## 2015-01-04 NOTE — Progress Notes (Signed)
Primary Care Physician:  Wende Neighbors, MD  Primary Gastroenterologist:  Garfield Cornea, MD   Chief Complaint  Patient presents with  . set up TCs    HPI:  [name] is a 81 y.o. male here for consideration of surveillance colonoscopy for history of numerous adenomatous colon polyps. Previous colonoscopy in [id number] complicated by post polypectomy bleeding after saline-assisted polypectomy of cecal polyp. Actually required 2 therapeutic colonoscopies. Fourth colonoscopy one year later to go back and remove noted polyps that were not removed at time of therapeutic colonoscopies. Seen by Dr. Drue Stager and no evidence of bleeding diathesis.   He has a history of Parkinson's disease. Recently went up on his carbidopa-levodopa dosage. Has had some constipation. Could go one week without a bowel movement. Taking MiraLAX once daily with good results, now having daily bowel movements. He denies any blood in the stool or melena. Denies abdominal pain, heartburn, dysphagia, vomiting.  Not on any ASA products.   He reminds me that his son is a gastroenterologist in [place] he consulted with Dr. Gala Romney after his post polypectomy bleed in 2011.  Current Outpatient Prescriptions  Medication Sig Dispense Refill  . amLODipine (NORVASC) 10 MG tablet Take 10 mg by mouth daily.    . calcium gluconate 500 MG tablet Take 500 mg by mouth daily.    . carbidopa-levodopa (SINEMET IR) 25-100 MG tablet Take 1 pill 5 times a day: 6, 10, 2PM, 6PM, and 9 PM 450 tablet 3  . Docusate Calcium (STOOL SOFTENER PO) Take by mouth 2 (two) times daily.    Marland Kitchen glucosamine-chondroitin 500-400 MG tablet Take 1 tablet by mouth once.    Marland Kitchen ketoconazole (NIZORAL) 2 % cream Apply 1 application topically daily as needed for irritation.     . Multiple Vitamin (MULTIVITAMIN) tablet Take 1 tablet by mouth daily.    Marland Kitchen omeprazole (PRILOSEC OTC) 20 MG tablet Take 20 mg by mouth daily.    . pramipexole (MIRAPEX) 0.5 MG tablet Take 1 tablet (0.5 mg  total) by mouth 3 (three) times daily. 270 tablet 3  . telmisartan-hydrochlorothiazide (MICARDIS HCT) 80-25 MG per tablet Take 1 tablet by mouth daily.    . valsartan-hydrochlorothiazide (DIOVAN-HCT) 80-12.5 MG tablet     . vitamin C (ASCORBIC ACID) 500 MG tablet Take 500 mg by mouth daily.     No current facility-administered medications for this visit.    Allergies as of 01/04/2015  . (No Known Allergies)    Past Medical History  Diagnosis Date  . Hypercholesteremia   . Parkinson's disease ([place])   . Hypertension   . Sleep disorder   . Ex-smoker   . Cancer [facility])     h/o  skin  cancer  . Sleep apnea     ?? sleep apnea....tested 2013 @ [facility]     . GERD (gastroesophageal reflux disease)   . Arthritis   . Lumbar stenosis     Past Surgical History  Procedure Laterality Date  . Tonsillectomy Bilateral   . Meniscus repair      right knee  2000  . Eye surgery      bilateral  cataracts  . Appendectomy    . Back surgery  11/2013  . Colonoscopy  11/17/2008    Rourk: Sessile polyp in the cecum status post saline-assisted piecemeal polypectomy, resolution clipping and epinephrine injection therapy performed. Shallow sigmoid diverticula.. Tubular adenoma  . Colonoscopy  11/19/2008    Fields: Large amount of liquid blood with clots seen throughout  the colon. Previous [place] resolution clip remained in place. Purple spot seen in the lateral aspect polypectomy site, 3 resolution clips placed here. 6 mm sessile ascending colon polyp seen and not manipulated.  . Colonoscopy  11/25/2008    Rourk: Blood-tinged colonic effluent, suspect recurrent post polypectomy bleeding status post placement of 3 clips on the polypectomy site on top of the 3 clips that already existed, one had fallen off since last procedure. One small polyp distal to the cecum not manipulated  . Colonoscopy  12/06/2009    Rourk: Internal hemorrhoids, scattered left-sided diverticula, cecal polyps, one snared  and subsequently clipped, 2 adjacent diminutive polyps ablated. tubular adenoma    Family History  Problem Relation Age of Onset  . Dementia Mother   . Cancer Brother   . Stroke Brother     Social History   Social History  . Marital Status: Married    Spouse Name: Clinical research associate  . Number of Children: 4  . Years of Education: 16   Occupational History  . Retired     Social History Main Topics  . Smoking status: Former Smoker -- 1.00 packs/day    Types: Cigarettes, Pipe    Start date: 01/22/1951    Quit date: 01/21/1989  . Smokeless tobacco: Never Used  . Alcohol Use: No  . Drug Use: No  . Sexual Activity: Not on file   Other Topics Concern  . Not on file   Social History Narrative      ROS:  General: Negative for anorexia, weight loss, fever, chills, fatigue, weakness. Eyes: Negative for vision changes.  ENT: Negative for hoarseness, difficulty swallowing , nasal congestion. CV: Negative for chest pain, angina, palpitations, dyspnea on exertion, peripheral edema.  Respiratory: Negative for dyspnea at rest, dyspnea on exertion, cough, sputum, wheezing.  GI: See history of present illness. GU:  Negative for dysuria, hematuria, urinary incontinence, urinary frequency, nocturnal urination.  MS: Negative for joint pain, low back pain.  Derm: Negative for rash or itching.  Neuro: Negative for weakness, abnormal sensation, seizure, frequent headaches, memory loss, confusion. Positive tremor Psych: Negative for anxiety, depression, suicidal ideation, hallucinations.  Endo: Negative for unusual weight change.  Heme: Negative for bruising or bleeding. Allergy: Negative for rash or hives.    Physical Examination:  BP 126/80 mmHg  Pulse 80  Temp(Src) 97.3 F (36.3 C)  Ht 5\' 11"  (1.803 m)  Wt 201 lb 9.6 oz (91.445 kg)  BMI 28.13 kg/m2   General: Well-nourished, well-developed in no acute distress.  Head: Normocephalic, atraumatic.   Eyes: Conjunctiva pink, no  icterus. Mouth: Oropharyngeal mucosa moist and pink , no lesions erythema or exudate. Neck: Supple without thyromegaly, masses, or lymphadenopathy.  Lungs: Clear to auscultation bilaterally.  Heart: Regular rate and rhythm, no murmurs rubs or gallops.  Abdomen: Bowel sounds are normal, nontender, nondistended, no hepatosplenomegaly or masses, no abdominal bruits or    hernia , no rebound or guarding.   Rectal: Deferred Extremities: No lower extremity edema. No clubbing or deformities.  Neuro: Alert and oriented x 4 , grossly normal neurologically.  Skin: Warm and dry, no rash or jaundice.   Psych: Alert and cooperative, normal mood and affect.  Imaging Studies: US Aorta  12/08/2014  CLINICAL DATA:  Abdominal aortic aneurysm. EXAM: ULTRASOUND OF ABDOMINAL AORTA TECHNIQUE: Ultrasound examination of the abdominal aorta was performed to evaluate for abdominal aortic aneurysm. COMPARISON:  None. FINDINGS: Abdominal Aorta 3.0 x 3.7 cm mid abdominal aortic aneurysm. Maximum Diameter: 3.7 cm  IMPRESSION: 3.7 cm mid abdominal aortic aneurysm. Recommend followup by ultrasound in 2 years. This recommendation follows ACR consensus guidelines: White Paper of the ACR Incidental Findings Committee II on Vascular Findings. J Am Coll Radiol 2013; 10:789-794. Electronically Signed   By: Marcello Moores  Register   On: 12/08/2014 09:02

## 2015-01-04 NOTE — Assessment & Plan Note (Addendum)
81 year old gentleman with history of adenomatous colon polyps who presents for surveillance colonoscopy. Overall health is reasonably good. Patient desires colonoscopy. His son is gastroenterologist in [place].  In 2011 he underwent colonoscopy with polypectomy complicated by post polypectomy bleeding requiring 2 colonoscopies with therapeutic intervention. Total of 4 colonoscopies in one year because he required subsequent follow-up (fourth) colonoscopy to remove a polyp that was not removed at time of therapeutic colonoscopy for bleeding. He has previously been seen by hematology (Dr. Tressie Stalker) and no evidence of bleeding diathesis.  He has some constipation related to side effects from medication. Managed very well with MiraLAX 17 g daily. Advised to continue medication. Colonoscopy in the upcoming future with Dr. Gala Romney.  I have discussed the risks, alternatives, benefits with regards to but not limited to the risk of reaction to medication, bleeding, infection, perforation and the patient is agreeable to proceed. Written consent to be obtained.

## 2015-01-04 NOTE — Patient Instructions (Signed)
1. Colonoscopy as scheduled. Please see separate instructions. 2. Continue MiraLAX daily for management of constipation.

## 2015-01-12 ENCOUNTER — Ambulatory Visit: Payer: Medicare Other | Admitting: Neurology

## 2015-01-14 ENCOUNTER — Other Ambulatory Visit: Payer: Self-pay

## 2015-01-14 DIAGNOSIS — G20 Parkinson's disease: Secondary | ICD-10-CM

## 2015-01-21 ENCOUNTER — Encounter (HOSPITAL_COMMUNITY): Payer: Self-pay | Admitting: *Deleted

## 2015-01-21 ENCOUNTER — Ambulatory Visit (HOSPITAL_COMMUNITY)
Admission: RE | Admit: 2015-01-21 | Discharge: 2015-01-21 | Disposition: A | Discharge status: Home / Self Care | Payer: Medicare Other | Source: Ambulatory Visit | Attending: Internal Medicine | Admitting: Internal Medicine

## 2015-01-21 ENCOUNTER — Encounter (HOSPITAL_COMMUNITY)
Admission: RE | Disposition: A | Discharge status: Home / Self Care | Payer: Self-pay | Source: Ambulatory Visit | Attending: Internal Medicine

## 2015-01-21 DIAGNOSIS — Z87891 Personal history of nicotine dependence: Secondary | ICD-10-CM | POA: Insufficient documentation

## 2015-01-21 DIAGNOSIS — G20 Parkinson's disease: Secondary | ICD-10-CM | POA: Diagnosis not present

## 2015-01-21 DIAGNOSIS — D128 Benign neoplasm of rectum: Secondary | ICD-10-CM | POA: Diagnosis not present

## 2015-01-21 DIAGNOSIS — Z1211 Encounter for screening for malignant neoplasm of colon: Secondary | ICD-10-CM | POA: Insufficient documentation

## 2015-01-21 DIAGNOSIS — M1990 Unspecified osteoarthritis, unspecified site: Secondary | ICD-10-CM | POA: Diagnosis not present

## 2015-01-21 DIAGNOSIS — Z860100 Personal history of colon polyps, unspecified: Secondary | ICD-10-CM | POA: Insufficient documentation

## 2015-01-21 DIAGNOSIS — Z809 Family history of malignant neoplasm, unspecified: Secondary | ICD-10-CM | POA: Diagnosis not present

## 2015-01-21 DIAGNOSIS — I10 Essential (primary) hypertension: Secondary | ICD-10-CM | POA: Diagnosis not present

## 2015-01-21 DIAGNOSIS — E7800 Pure hypercholesterolemia, unspecified: Secondary | ICD-10-CM | POA: Insufficient documentation

## 2015-01-21 DIAGNOSIS — D122 Benign neoplasm of ascending colon: Secondary | ICD-10-CM | POA: Insufficient documentation

## 2015-01-21 DIAGNOSIS — K219 Gastro-esophageal reflux disease without esophagitis: Secondary | ICD-10-CM | POA: Diagnosis not present

## 2015-01-21 DIAGNOSIS — D120 Benign neoplasm of cecum: Secondary | ICD-10-CM

## 2015-01-21 DIAGNOSIS — Z85828 Personal history of other malignant neoplasm of skin: Secondary | ICD-10-CM | POA: Diagnosis not present

## 2015-01-21 DIAGNOSIS — Z79899 Other long term (current) drug therapy: Secondary | ICD-10-CM | POA: Diagnosis not present

## 2015-01-21 HISTORY — PX: COLONOSCOPY: SHX5424

## 2015-01-21 SURGERY — COLONOSCOPY
Anesthesia: Moderate Sedation

## 2015-01-21 MED ORDER — SIMETHICONE 40 MG/0.6ML PO SUSP
ORAL | Status: DC | PRN
  Administered 2015-01-21: 09:00:00

## 2015-01-21 MED ORDER — MEPERIDINE HCL 100 MG/ML IJ SOLN
INTRAMUSCULAR | Status: AC
  Filled 2015-01-21: qty 2

## 2015-01-21 MED ORDER — MEPERIDINE HCL 100 MG/ML IJ SOLN
INTRAMUSCULAR | Status: DC | PRN
  Administered 2015-01-21: 25 mg via INTRAVENOUS

## 2015-01-21 MED ORDER — ONDANSETRON HCL 4 MG/2ML IJ SOLN
INTRAMUSCULAR | Status: DC | PRN
  Administered 2015-01-21: 4 mg via INTRAVENOUS

## 2015-01-21 MED ORDER — MIDAZOLAM HCL 5 MG/5ML IJ SOLN
INTRAMUSCULAR | Status: AC
  Filled 2015-01-21: qty 10

## 2015-01-21 MED ORDER — MIDAZOLAM HCL 5 MG/5ML IJ SOLN
INTRAMUSCULAR | Status: DC | PRN
  Administered 2015-01-21: 2 mg via INTRAVENOUS
  Administered 2015-01-21 (×4): 1 mg via INTRAVENOUS

## 2015-01-21 MED ORDER — SODIUM CHLORIDE 0.9 % IV SOLN
INTRAVENOUS | Status: DC
  Administered 2015-01-21: 1000 mL via INTRAVENOUS

## 2015-01-21 MED ORDER — ONDANSETRON HCL 4 MG/2ML IJ SOLN
INTRAMUSCULAR | Status: AC
  Filled 2015-01-21: qty 2

## 2015-01-21 NOTE — Interval H&P Note (Signed)
History and Physical Interval Note:  01/21/2015 8:33 AM  [name]  has presented today for surgery, with the diagnosis of HISTORY OF POLYPS  The various methods of treatment have been discussed with the patient and family. After consideration of risks, benefits and other options for treatment, the patient has consented to  Procedure(s) with comments: COLONOSCOPY (N/A) - 730 - moved to 8:30 - Dr. has meeting, office talked to pt as a surgical intervention .  The patient's history has been reviewed, patient examined, no change in status, stable for surgery.  I have reviewed the patient's chart and labs.  Questions were answered to the patient's satisfaction.     Robert Rourk  No chanage; surveillance tcs per plan; The risks, benefits, limitations, alternatives and imponderables have been reviewed with the patient. Questions have been answered. All parties are agreeable.

## 2015-01-21 NOTE — Discharge Instructions (Signed)
Colonoscopy Discharge Instructions  Read the instructions outlined below and refer to this sheet in the next few weeks. These discharge instructions provide you with general information on caring for yourself after you leave the hospital. Your doctor may also give you specific instructions. While your treatment has been planned according to the most current medical practices available, unavoidable complications occasionally occur. If you have any problems or questions after discharge, call Dr. Gala Romney at [phone]. ACTIVITY  You may resume your regular activity, but move at a slower pace for the next 24 hours.   Take frequent rest periods for the next 24 hours.   Walking will help get rid of the air and reduce the bloated feeling in your belly (abdomen).   No driving for 24 hours (because of the medicine (anesthesia) used during the test).    Do not sign any important legal documents or operate any machinery for 24 hours (because of the anesthesia used during the test).  NUTRITION  Drink plenty of fluids.   You may resume your normal diet as instructed by your doctor.   Begin with a light meal and progress to your normal diet. Heavy or fried foods are harder to digest and may make you feel sick to your stomach (nauseated).   Avoid alcoholic beverages for 24 hours or as instructed.  MEDICATIONS  You may resume your normal medications unless your doctor tells you otherwise.  WHAT YOU CAN EXPECT TODAY  Some feelings of bloating in the abdomen.   Passage of more gas than usual.   Spotting of blood in your stool or on the toilet paper.  IF YOU HAD POLYPS REMOVED DURING THE COLONOSCOPY:  No aspirin products for 7 days or as instructed.   No alcohol for 7 days or as instructed.   Eat a soft diet for the next 24 hours.  FINDING OUT THE RESULTS OF YOUR TEST Not all test results are available during your visit. If your test results are not back during the visit, make an appointment  with your caregiver to find out the results. Do not assume everything is normal if you have not heard from your caregiver or the medical facility. It is important for you to follow up on all of your test results.  SEEK IMMEDIATE MEDICAL ATTENTION IF:  You have more than a spotting of blood in your stool.   Your belly is swollen (abdominal distention).   You are nauseated or vomiting.   You have a temperature over 101.   You have abdominal pain or discomfort that is severe or gets worse throughout the day.    Colon polyp information provided  No MRI in the future until clips gone  Further recommendations to follow pending review of pathology report  Colon Polyps Polyps are lumps of extra tissue growing inside the body. Polyps can grow in the large intestine (colon). Most colon polyps are noncancerous (benign). However, some colon polyps can become cancerous over time. Polyps that are larger than a pea may be harmful. To be safe, caregivers remove and test all polyps. CAUSES  Polyps form when mutations in the genes cause your cells to grow and divide even though no more tissue is needed. RISK FACTORS There are a number of risk factors that can increase your chances of getting colon polyps. They include:  Being older than 50 years.  Family history of colon polyps or colon cancer.  Long-term colon diseases, such as colitis or Crohn disease.  Being overweight.  Smoking.  Being inactive.  Drinking too much alcohol. SYMPTOMS  Most small polyps do not cause symptoms. If symptoms are present, they may include:  Blood in the stool. The stool may look dark red or black.  Constipation or diarrhea that lasts longer than 1 week. DIAGNOSIS People often do not know they have polyps until their caregiver finds them during a regular checkup. Your caregiver can use 4 tests to check for polyps:  Digital rectal exam. The caregiver wears gloves and feels inside the rectum. This test would  find polyps only in the rectum.  Barium enema. The caregiver puts a liquid called barium into your rectum before taking X-rays of your colon. Barium makes your colon look white. Polyps are dark, so they are easy to see in the X-ray pictures.  Sigmoidoscopy. A thin, flexible tube (sigmoidoscope) is placed into your rectum. The sigmoidoscope has a light and tiny camera in it. The caregiver uses the sigmoidoscope to look at the last third of your colon.  Colonoscopy. This test is like sigmoidoscopy, but the caregiver looks at the entire colon. This is the most common method for finding and removing polyps. TREATMENT  Any polyps will be removed during a sigmoidoscopy or colonoscopy. The polyps are then tested for cancer. PREVENTION  To help lower your risk of getting more colon polyps:  Eat plenty of fruits and vegetables. Avoid eating fatty foods.  Do not smoke.  Avoid drinking alcohol.  Exercise every day.  Lose weight if recommended by your caregiver.  Eat plenty of calcium and folate. Foods that are rich in calcium include milk, cheese, and broccoli. Foods that are rich in folate include chickpeas, kidney beans, and spinach. HOME CARE INSTRUCTIONS Keep all follow-up appointments as directed by your caregiver. You may need periodic exams to check for polyps. SEEK MEDICAL CARE IF: You notice bleeding during a bowel movement.   This information is not intended to replace advice given to you by your health care provider. Make sure you discuss any questions you have with your health care provider.   Document Released: 09/29/2003 Document Revised: 01/23/2014 Document Reviewed: 03/14/2011 Elsevier Interactive Patient Education [employer].

## 2015-01-21 NOTE — H&P (View-Only) (Signed)
Primary Care Physician:  Wende Neighbors, MD  Primary Gastroenterologist:  Garfield Cornea, MD   Chief Complaint  Patient presents with  . set up TCs    HPI:  [name] is a 82 y.o. male here for consideration of surveillance colonoscopy for history of numerous adenomatous colon polyps. Previous colonoscopy in [id number] complicated by post polypectomy bleeding after saline-assisted polypectomy of cecal polyp. Actually required 2 therapeutic colonoscopies. Fourth colonoscopy one year later to go back and remove noted polyps that were not removed at time of therapeutic colonoscopies. Seen by Dr. Drue Stager and no evidence of bleeding diathesis.   He has a history of Parkinson's disease. Recently went up on his carbidopa-levodopa dosage. Has had some constipation. Could go one week without a bowel movement. Taking MiraLAX once daily with good results, now having daily bowel movements. He denies any blood in the stool or melena. Denies abdominal pain, heartburn, dysphagia, vomiting.  Not on any ASA products.   He reminds me that his son is a gastroenterologist in [place] he consulted with Dr. Gala Romney after his post polypectomy bleed in 2011.  Current Outpatient Prescriptions  Medication Sig Dispense Refill  . amLODipine (NORVASC) 10 MG tablet Take 10 mg by mouth daily.    . calcium gluconate 500 MG tablet Take 500 mg by mouth daily.    . carbidopa-levodopa (SINEMET IR) 25-100 MG tablet Take 1 pill 5 times a day: 6, 10, 2PM, 6PM, and 9 PM 450 tablet 3  . Docusate Calcium (STOOL SOFTENER PO) Take by mouth 2 (two) times daily.    Marland Kitchen glucosamine-chondroitin 500-400 MG tablet Take 1 tablet by mouth once.    Marland Kitchen ketoconazole (NIZORAL) 2 % cream Apply 1 application topically daily as needed for irritation.     . Multiple Vitamin (MULTIVITAMIN) tablet Take 1 tablet by mouth daily.    Marland Kitchen omeprazole (PRILOSEC OTC) 20 MG tablet Take 20 mg by mouth daily.    . pramipexole (MIRAPEX) 0.5 MG tablet Take 1 tablet (0.5 mg  total) by mouth 3 (three) times daily. 270 tablet 3  . telmisartan-hydrochlorothiazide (MICARDIS HCT) 80-25 MG per tablet Take 1 tablet by mouth daily.    . valsartan-hydrochlorothiazide (DIOVAN-HCT) 80-12.5 MG tablet     . vitamin C (ASCORBIC ACID) 500 MG tablet Take 500 mg by mouth daily.     No current facility-administered medications for this visit.    Allergies as of 01/04/2015  . (No Known Allergies)    Past Medical History  Diagnosis Date  . Hypercholesteremia   . Parkinson's disease ([place])   . Hypertension   . Sleep disorder   . Ex-smoker   . Cancer [facility]     h/o  skin  cancer  . Sleep apnea     ?? sleep apnea....tested 2013 @ [facility]     . GERD (gastroesophageal reflux disease)   . Arthritis   . Lumbar stenosis     Past Surgical History  Procedure Laterality Date  . Tonsillectomy Bilateral   . Meniscus repair      right knee  2000  . Eye surgery      bilateral  cataracts  . Appendectomy    . Back surgery  11/2013  . Colonoscopy  11/17/2008    Rourk: Sessile polyp in the cecum status post saline-assisted piecemeal polypectomy, resolution clipping and epinephrine injection therapy performed. Shallow sigmoid diverticula.. Tubular adenoma  . Colonoscopy  11/19/2008    Fields: Large amount of liquid blood with clots seen throughout  the colon. Previous [place] resolution clip remained in place. Purple spot seen in the lateral aspect polypectomy site, 3 resolution clips placed here. 6 mm sessile ascending colon polyp seen and not manipulated.  . Colonoscopy  11/25/2008    Rourk: Blood-tinged colonic effluent, suspect recurrent post polypectomy bleeding status post placement of 3 clips on the polypectomy site on top of the 3 clips that already existed, one had fallen off since last procedure. One small polyp distal to the cecum not manipulated  . Colonoscopy  12/06/2009    Rourk: Internal hemorrhoids, scattered left-sided diverticula, cecal polyps, one snared  and subsequently clipped, 2 adjacent diminutive polyps ablated. tubular adenoma    Family History  Problem Relation Age of Onset  . Dementia Mother   . Cancer Brother   . Stroke Brother     Social History   Social History  . Marital Status: Married    Spouse Name: Clinical research associate  . Number of Children: 4  . Years of Education: 16   Occupational History  . Retired     Social History Main Topics  . Smoking status: Former Smoker -- 1.00 packs/day    Types: Cigarettes, Pipe    Start date: 01/22/1951    Quit date: 01/21/1989  . Smokeless tobacco: Never Used  . Alcohol Use: No  . Drug Use: No  . Sexual Activity: Not on file   Other Topics Concern  . Not on file   Social History Narrative      ROS:  General: Negative for anorexia, weight loss, fever, chills, fatigue, weakness. Eyes: Negative for vision changes.  ENT: Negative for hoarseness, difficulty swallowing , nasal congestion. CV: Negative for chest pain, angina, palpitations, dyspnea on exertion, peripheral edema.  Respiratory: Negative for dyspnea at rest, dyspnea on exertion, cough, sputum, wheezing.  GI: See history of present illness. GU:  Negative for dysuria, hematuria, urinary incontinence, urinary frequency, nocturnal urination.  MS: Negative for joint pain, low back pain.  Derm: Negative for rash or itching.  Neuro: Negative for weakness, abnormal sensation, seizure, frequent headaches, memory loss, confusion. Positive tremor Psych: Negative for anxiety, depression, suicidal ideation, hallucinations.  Endo: Negative for unusual weight change.  Heme: Negative for bruising or bleeding. Allergy: Negative for rash or hives.    Physical Examination:  BP 126/80 mmHg  Pulse 80  Temp(Src) 97.3 F (36.3 C)  Ht 5\' 11"  (1.803 m)  Wt 201 lb 9.6 oz (91.445 kg)  BMI 28.13 kg/m2   General: Well-nourished, well-developed in no acute distress.  Head: Normocephalic, atraumatic.   Eyes: Conjunctiva pink, no  icterus. Mouth: Oropharyngeal mucosa moist and pink , no lesions erythema or exudate. Neck: Supple without thyromegaly, masses, or lymphadenopathy.  Lungs: Clear to auscultation bilaterally.  Heart: Regular rate and rhythm, no murmurs rubs or gallops.  Abdomen: Bowel sounds are normal, nontender, nondistended, no hepatosplenomegaly or masses, no abdominal bruits or    hernia , no rebound or guarding.   Rectal: Deferred Extremities: No lower extremity edema. No clubbing or deformities.  Neuro: Alert and oriented x 4 , grossly normal neurologically.  Skin: Warm and dry, no rash or jaundice.   Psych: Alert and cooperative, normal mood and affect.  Imaging Studies: US Aorta  12/08/2014  CLINICAL DATA:  Abdominal aortic aneurysm. EXAM: ULTRASOUND OF ABDOMINAL AORTA TECHNIQUE: Ultrasound examination of the abdominal aorta was performed to evaluate for abdominal aortic aneurysm. COMPARISON:  None. FINDINGS: Abdominal Aorta 3.0 x 3.7 cm mid abdominal aortic aneurysm. Maximum Diameter: 3.7 cm  IMPRESSION: 3.7 cm mid abdominal aortic aneurysm. Recommend followup by ultrasound in 2 years. This recommendation follows ACR consensus guidelines: White Paper of the ACR Incidental Findings Committee II on Vascular Findings. J Am Coll Radiol 2013; 10:789-794. Electronically Signed   By: Marcello Moores  Register   On: 12/08/2014 09:02

## 2015-01-21 NOTE — Op Note (Signed)
[facility] [address] [place], [address]   COLONOSCOPY PROCEDURE REPORT  PATIENT: [name], [name]  MR#: [id number] BIRTHDATE: [birth year] , 83  yrs. old GENDER: male ENDOSCOPIST: R.  Garfield Cornea, MD FACP [facility] REFERRED [id number] Hall, M.D. PROCEDURE DATE:  15-Feb-2015 PROCEDURE:   Colonoscopy with snare polypectomy and ablation INDICATIONS:History of colonic adenoma. MEDICATIONS: Versed 6 mg IV and Demerol 25 mg IV in divided doses. Zofran 4 mg IV. ASA CLASS:       Class III  CONSENT: The risks, benefits, alternatives and imponderables including but not limited to bleeding, perforation as well as the possibility of a missed lesion have been reviewed.  The potential for biopsy, lesion removal, etc. have also been discussed. Questions have been answered.  All parties agreeable.  Please see the history and physical in the medical record for more information.  DESCRIPTION OF PROCEDURE:   After the risks benefits and alternatives of the procedure were thoroughly explained, informed consent was obtained.  The digital rectal exam revealed no abnormalities of the rectum.   The [id number]  endoscope was introduced through the anus and advanced to the cecum, which was identified by both the appendix and ileocecal valve. No adverse events experienced.   The quality of the prep was adequate  The instrument was then slowly withdrawn as the colon was fully examined. Estimated blood loss is zero unless otherwise noted in this procedure report.      COLON FINDINGS: (1) 4 mm pedunculated polyp in the rectum 5 cm from the anal verge; otherwise, the remainder of the rectal mucosa appeared normal.  Patient had a redundant colon.  Required external pressure and changing of the patient's position to reach the cecum. In a fold just distal to the ileocecal valve, there was (1) 1.5 cm carpet type polyp rolling over fold.  It was very difficult to get at; we are  relatively unstable position with the scope; I attempted to retroflex proximally to determine the extent of it over the phone.  It appeared to be right to the fold tip.  I did not see any significant degree of polyp tissue down in the next fold.  Utilizing hot snare cautery, this lesion was removed in piecemeal fashion.  There was slight amount of residual polyp tissue at the fold tip which would not engage the snare very well.  I subsequently ablated it with a APC/circular probe on right colon settings.  Because the size of the polyp, and his prior history of recalcitrant post polypectomy bleeding, I elected to place (3) 360 clips to seal the polypectomy site.  This was done without difficulty or apparent complication.  The rectal polyp was cold snare removed.  Retroflexion was performed. .  Withdrawal time=35 minutes 0 seconds.  sedation start 837; sedation and 939.   The scope was withdrawn and the procedure completed. COMPLICATIONS: There were no immediate complications. EBL 3  mL ENDOSCOPIC IMPRESSION:  Colonic polyp removed as described above. Colonic polyp ablated. clips applied  RECOMMENDATIONS: No MRI until clips gone. Follow up on pathology.  eSigned:  R. Garfield Cornea, MD Rosalita Chessman [facility] 02-15-15 9:55 AM   cc:  CPT CODES: ICD CODES:  The ICD and CPT codes recommended by this software are interpretations from the data that the clinical staff has captured with the software.  The verification of the translation of this report to the ICD and CPT codes and modifiers is the sole responsibility of the health care institution and practicing  physician where this report was generated.  [facility]. will not be held responsible for the validity of the ICD and CPT codes included on this report.  AMA assumes no liability for data contained or not contained herein. CPT is a Designer, television/film set of the [employer].  PATIENT NAME:  [name], [name] MR#: [id number]

## 2015-01-25 ENCOUNTER — Encounter (HOSPITAL_COMMUNITY): Payer: Self-pay | Admitting: Internal Medicine

## 2015-01-26 ENCOUNTER — Telehealth: Payer: Self-pay

## 2015-01-26 ENCOUNTER — Encounter: Payer: Self-pay | Admitting: Internal Medicine

## 2015-01-26 ENCOUNTER — Ambulatory Visit: Payer: Medicare Other | Attending: Neurology | Admitting: Physical Therapy

## 2015-01-26 ENCOUNTER — Other Ambulatory Visit: Payer: Self-pay

## 2015-01-26 DIAGNOSIS — R269 Unspecified abnormalities of gait and mobility: Secondary | ICD-10-CM

## 2015-01-26 DIAGNOSIS — R293 Abnormal posture: Secondary | ICD-10-CM | POA: Diagnosis not present

## 2015-01-26 DIAGNOSIS — R29898 Other symptoms and signs involving the musculoskeletal system: Secondary | ICD-10-CM | POA: Diagnosis not present

## 2015-01-26 DIAGNOSIS — R258 Other abnormal involuntary movements: Secondary | ICD-10-CM | POA: Diagnosis not present

## 2015-01-26 DIAGNOSIS — G20 Parkinson's disease: Secondary | ICD-10-CM

## 2015-01-26 DIAGNOSIS — M6281 Muscle weakness (generalized): Secondary | ICD-10-CM | POA: Insufficient documentation

## 2015-01-26 NOTE — Therapy (Signed)
[facility] [address] [place], [place], [address] Phone: [phone]   Fax:  [phone]  Physical Therapy Evaluation  Patient Details  Name: [name] MRN: [id number] Date of Birth: [birth year] Referring Provider: Rexene Alberts  Encounter Date: 01/26/2015      PT End of Session - 01/26/15 1441    Visit Number 1   Number of Visits 9   Date for PT Re-Evaluation 03/25/15   Authorization Type Medicare-G code every 10th visit   PT Start Time 0940  Pt uses restroom at beginning of session   PT Stop Time 1014   PT Time Calculation (min) 34 min   Activity Tolerance Patient tolerated treatment well   Behavior During Therapy [facility] for tasks assessed/performed      Past Medical History  Diagnosis Date  . Hypercholesteremia   . Parkinson's disease ([place])   . Hypertension   . Sleep disorder   . Ex-smoker   . Cancer [facility])     h/o  skin  cancer  . Sleep apnea     ?? sleep apnea....tested 2013 @ [facility]     . GERD (gastroesophageal reflux disease)   . Arthritis   . Lumbar stenosis     Past Surgical History  Procedure Laterality Date  . Tonsillectomy Bilateral   . Meniscus repair      right knee  2000  . Eye surgery      bilateral  cataracts  . Appendectomy    . Back surgery  11/2013  . Colonoscopy  11/17/2008    Rourk: Sessile polyp in the cecum status post saline-assisted piecemeal polypectomy, resolution clipping and epinephrine injection therapy performed. Shallow sigmoid diverticula.. Tubular adenoma  . Colonoscopy  11/19/2008    Fields: Large amount of liquid blood with clots seen throughout the colon. Previous Boston resolution clip remained in place. Purple spot seen in the lateral aspect polypectomy site, 3 resolution clips placed here. 6 mm sessile ascending colon polyp seen and not manipulated.  . Colonoscopy  11/25/2008    Rourk: Blood-tinged colonic effluent, suspect recurrent post  polypectomy bleeding status post placement of 3 clips on the polypectomy site on top of the 3 clips that already existed, one had fallen off since last procedure. One small polyp distal to the cecum not manipulated  . Colonoscopy  12/06/2009    Rourk: Internal hemorrhoids, scattered left-sided diverticula, cecal polyps, one snared and subsequently clipped, 2 adjacent diminutive polyps ablated. tubular adenoma  . Colonoscopy N/A 01/21/2015    Procedure: COLONOSCOPY;  Surgeon: Daneil Dolin, MD;  Location: [facility];  Service: Endoscopy;  Laterality: N/A;  730 - moved to 8:30 - Dr. has meeting, office talked to pt    There were no vitals filed for this visit.  Visit Diagnosis:  Abnormality of gait  Muscle weakness of lower extremity  Rigidity (muscles)  Bradykinesia  Posture abnormality      Subjective Assessment - 01/26/15 0940    Subjective Pt is an 82 year old male with Parkinson's disease.  He reports increased stiffness, more difficulty getting up from chairs, difficulty with dressing over the past 6 months.  Pt has not had any falls in the past 6 months.  Pt reports slowing with getting in and out of bed, in and out of car.   Patient Stated Goals Pt's goal for therapy is to help get coat on and move better overall.   Currently in Pain? No/denies  [facility] PT Assessment - 01/26/15 0942    Assessment   Medical Diagnosis Parkinson's disease   Referring Provider Athar   Onset Date/Surgical Date --  diagnosed 5 years ago, incr. funct difficulty past 6 months   Precautions   Precautions Fall   Balance Screen   Has the patient fallen in the past 6 months No   Has the patient had a decrease in activity level because of a fear of falling?  No   Is the patient reluctant to leave their home because of a fear of falling?  No   Home Ecologist residence   Living Arrangements Spouse/significant other   Available Help at Discharge Family    Type of [place] to enter   Entrance Stairs-Number of Steps 1   Entrance Stairs-Rails None   Home Layout Multi-level;Able to live on main level with bedroom/bathroom   Prior Function   Level of Independence Independent with basic ADLs;Independent with household mobility without device;Independent with community mobility without device  Slowing with ADLs-dressing   Vocation Retired   Leisure Works on mowing the yard, goes up and down basement steps multiple times per day, rides exercise bike daily   Posture/Postural Control   Posture/Postural Control Postural limitations   Postural Limitations Forward head;Rounded Shoulders   ROM / Strength   AROM / PROM / Strength Strength;AROM;PROM   AROM   Overall AROM  Within functional limits for tasks performed  lower extremities   Overall AROM Comments increased tremor LUE   PROM   Overall PROM Comments rigidity noted with P/ROM bilateral LEs   Strength   Overall Strength Comments grossly tested bilateral lower extremities 4/5 throughout   Transfers   Transfers Sit to Stand;Stand to Sit   Sit to Stand 6: Modified independent (Device/Increase time);Without upper extremity assist;From chair/3-in-1   Five time sit to stand comments  11.69   Stand to Sit 6: Modified independent (Device/Increase time);Without upper extremity assist;To chair/3-in-1   Comments With transfers, feet narrow base of support, with initial hesitation for forward lean to stand   Ambulation/Gait   Ambulation/Gait Yes   Ambulation/Gait Assistance 6: Modified independent (Device/Increase time)   Ambulation Distance (Feet) 200 Feet   Assistive device None   Gait Pattern Step-through pattern;Decreased arm swing - right;Decreased arm swing - left;Decreased step length - right;Decreased step length - left;Trunk flexed;Decreased trunk rotation;Poor foot clearance - left;Poor foot clearance - right;Narrow base of support  decr. foot clearance with turns    Ambulation Surface Level;Indoor   Gait velocity 10.21 sec = 3.21 ft/sec   Standardized Balance Assessment   Standardized Balance Assessment Timed Up and Go Test   Timed Up and Go Test   Normal TUG (seconds) 12.06   Manual TUG (seconds) 12.47   Cognitive TUG (seconds) 12.32   High Level Balance   High Level Balance Comments Posterior push and release test:  1 step, with pt taking extended time for the step.  SLS RLE:  4 sec, LLE 5 sec with increased sway/lean   Functional Gait  Assessment   Gait assessed  Yes   Gait Level Surface Walks 20 ft in less than 7 sec but greater than 5.5 sec, uses assistive device, slower speed, mild gait deviations, or deviates 6-10 in outside of the 12 in walkway width.  6.58   Change in Gait Speed Able to change speed, demonstrates mild gait deviations, deviates 6-10 in outside of the 12 in  walkway width, or no gait deviations, unable to achieve a major change in velocity, or uses a change in velocity, or uses an assistive device.   Gait with Horizontal Head Turns Performs head turns smoothly with slight change in gait velocity (eg, minor disruption to smooth gait path), deviates 6-10 in outside 12 in walkway width, or uses an assistive device.   Gait with Vertical Head Turns Performs task with slight change in gait velocity (eg, minor disruption to smooth gait path), deviates 6 - 10 in outside 12 in walkway width or uses assistive device   Gait and Pivot Turn Turns slowly, requires verbal cueing, or requires several small steps to catch balance following turn and stop   Step Over Obstacle Is able to step over one shoe box (4.5 in total height) but must slow down and adjust steps to clear box safely. May require verbal cueing.   Gait with Narrow Base of Support Ambulates 7-9 steps.   Gait with Eyes Closed Walks 20 ft, slow speed, abnormal gait pattern, evidence for imbalance, deviates 10-15 in outside 12 in walkway width. Requires more than 9 sec to ambulate 20 ft.   9.64   Ambulating Backwards Walks 20 ft, uses assistive device, slower speed, mild gait deviations, deviates 6-10 in outside 12 in walkway width.  9.78   Steps Alternating feet, no rail.   Total Score 18   FGA comment: Scores <22/30 indicate increased fall risk in community dwelling older adults                                PT Long Term Goals - 01/26/15 1450    PT LONG TERM GOAL #1   Title Pt will be independent with HEP for improved transfers, balance, and gait.  TARGET 02/26/15   Time 4   Period Weeks   Status New   PT LONG TERM GOAL #2   Title Pt will perform at least 8 of 10 reps of sit<>stand transfers from less than 18 inch surface without UE support and no posterior lean, for improved transfer efficiency and safety.   Time 4   Period Weeks   Status New   PT LONG TERM GOAL #3   Title Pt will improve Functional Gait Assessment score to at least 22/30 for decreased fall risk.   Baseline 4   Period Weeks   Status New   PT LONG TERM GOAL #4   Title Pt will verbalize understanding of fall prevention in the home environment.   Time 4   Period Weeks   Status New   PT LONG TERM GOAL #5   Title Pt will verbalize understanding of local Parkinson's disease-related resources.   Time 4   Period Weeks   Status New               Plan - 01/26/15 1442    Clinical Impression Statement Pt is an 82 year old gentleman who presents to OP PT with diagnosis of Parkinson's disease, x 5 years, with pt's reported increased difficulty with transfers, slowed dressing/ADLs and increased stiffness in upper and lower extremities in past 6 months.  He presents with at least 5 co-morbidities and evolving presentation (pt also c/o UE tremors, decreased coordination, decr. vocal volume).  For PT, pt presents with decreased lower extremity strength, rigidity in lower extremities, decreased transfer ability, decreased balance/decreased timing and coordination of gait.  The  above stated deficits are  limiting patient from full participation in [place] that he enjoys as well as efficiently participating in ADLs.  Pt would benefit from skilled PT to address the above stated deficits for overall imrpoved functional mobility and decreased fall risk.   Pt will benefit from skilled therapeutic intervention in order to improve on the following deficits Abnormal gait;Decreased balance;Decreased mobility;Decreased strength;Impaired flexibility;Postural dysfunction   Rehab Potential Good   PT Frequency 2x / week   PT Duration 4 weeks  plus eval   PT Treatment/Interventions ADLs/Self Care Home Management;Therapeutic exercise;Therapeutic activities;Functional mobility training;Gait training;Balance training;Neuromuscular re-education;Patient/family education   PT Next Visit Plan Initiate transfer training activities for HEP, PWR! Moves sitting and standing for large amplitude exercises as part of HEP; community PD education   Recommended Other Services Occupational therapy/speech therapy   Consulted and Agree with Plan of Care Patient          G-Codes - 19-Feb-2015 1454    Functional Assessment Tool Used 5x sit<>Stand 11.60 sec with initial hesitation of forward lean; 18/30 Functional Gait Assessment   Functional Limitation Mobility: Walking and moving around   Mobility: Walking and Moving Around Current Status [id number]) At least 20 percent but less than 40 percent impaired, limited or restricted   Mobility: Walking and Moving Around Goal Status [phone]) At least 1 percent but less than 20 percent impaired, limited or restricted       Problem List Patient Active Problem List   Diagnosis Date Noted  . History of colonic polyps   . Hx of adenomatous colonic polyps 01/04/2015  . Lumbar canal stenosis 12/04/2013  . Aortic stenosis 10/21/2013  . Hypoxemia 04/16/2012  . Primary central sleep apnea 04/16/2012  . Obstructive sleep apnea (adult) (pediatric) 04/16/2012  .  Orthostatic hypotension 04/16/2012  . Sleep disturbance, unspecified 04/16/2012  . Unspecified hereditary and idiopathic peripheral neuropathy 04/16/2012  . Parkinson's 04/16/2012  . COLONIC POLYPS, ADENOMATOUS 11/19/2008  . GERD 11/19/2008  . HEMATOCHEZIA 11/19/2008  . GI BLEEDING 11/19/2008  . HYPERLIPIDEMIA-MIXED 07/03/2008  . HYPERTENSION, UNSPECIFIED 07/03/2008    Matheo Rathbone W. 2015-02-19, 2:56 PM  Frazier Butt., PT  [facility] [address] [place], [place], [address] Phone: [phone]   Fax:  [phone]  Name: [name] MRN: [id number] Date of Birth: 1931-04-07

## 2015-01-26 NOTE — Telephone Encounter (Signed)
Letter mailed to the pt. 

## 2015-01-26 NOTE — Telephone Encounter (Signed)
Per RMR-  Send letter to patient.  Send copy of letter with path to referring provider and PCP. Office visit six-month

## 2015-01-26 NOTE — Telephone Encounter (Signed)
Reminder in epic °

## 2015-02-03 ENCOUNTER — Ambulatory Visit: Payer: Medicare Other | Admitting: Physical Therapy

## 2015-02-03 ENCOUNTER — Encounter: Payer: Self-pay | Admitting: Physical Therapy

## 2015-02-03 DIAGNOSIS — R293 Abnormal posture: Secondary | ICD-10-CM | POA: Diagnosis not present

## 2015-02-03 DIAGNOSIS — R258 Other abnormal involuntary movements: Secondary | ICD-10-CM | POA: Diagnosis not present

## 2015-02-03 DIAGNOSIS — R29898 Other symptoms and signs involving the musculoskeletal system: Secondary | ICD-10-CM | POA: Diagnosis not present

## 2015-02-03 DIAGNOSIS — R269 Unspecified abnormalities of gait and mobility: Secondary | ICD-10-CM

## 2015-02-03 DIAGNOSIS — M6281 Muscle weakness (generalized): Secondary | ICD-10-CM

## 2015-02-03 NOTE — Therapy (Signed)
[facility] [address] [place], [place], [address] Phone: [phone]   Fax:  [phone]  Physical Therapy Treatment  Patient Details  Name: [name] MRN: [id number] Date of Birth: 20-Jun-1931 Referring Provider: Rexene Alberts  Encounter Date: 02/03/2015      PT End of Session - 02/03/15 2144    Visit Number 2   Number of Visits 9   Date for PT Re-Evaluation 03/25/15   Authorization Type Medicare-G code every 10th visit   PT Start Time 1320   PT Stop Time 1403   PT Time Calculation (min) 43 min      Past Medical History  Diagnosis Date  . Hypercholesteremia   . Parkinson's disease ([place])   . Hypertension   . Sleep disorder   . Ex-smoker   . Cancer [facility])     h/o  skin  cancer  . Sleep apnea     ?? sleep apnea....tested 2013 @ [facility]     . GERD (gastroesophageal reflux disease)   . Arthritis   . Lumbar stenosis     Past Surgical History  Procedure Laterality Date  . Tonsillectomy Bilateral   . Meniscus repair      right knee  2000  . Eye surgery      bilateral  cataracts  . Appendectomy    . Back surgery  11/2013  . Colonoscopy  11/17/2008    Rourk: Sessile polyp in the cecum status post saline-assisted piecemeal polypectomy, resolution clipping and epinephrine injection therapy performed. Shallow sigmoid diverticula.. Tubular adenoma  . Colonoscopy  11/19/2008    Fields: Large amount of liquid blood with clots seen throughout the colon. Previous Boston resolution clip remained in place. Purple spot seen in the lateral aspect polypectomy site, 3 resolution clips placed here. 6 mm sessile ascending colon polyp seen and not manipulated.  . Colonoscopy  11/25/2008    Rourk: Blood-tinged colonic effluent, suspect recurrent post polypectomy bleeding status post placement of 3 clips on the polypectomy site on top of the 3 clips that already existed, one had fallen off since last procedure.  One small polyp distal to the cecum not manipulated  . Colonoscopy  12/06/2009    Rourk: Internal hemorrhoids, scattered left-sided diverticula, cecal polyps, one snared and subsequently clipped, 2 adjacent diminutive polyps ablated. tubular adenoma  . Colonoscopy N/A 01/21/2015    Procedure: COLONOSCOPY;  Surgeon: Daneil Dolin, MD;  Location: [facility];  Service: Endoscopy;  Laterality: N/A;  730 - moved to 8:30 - Dr. has meeting, office talked to pt    There were no vitals filed for this visit.  Visit Diagnosis:  Muscle weakness of lower extremity  Abnormality of gait  Posture abnormality      Subjective Assessment - 02/03/15 2106    Subjective Pt reports that his biggest problem is being able to get dressed; informed pt of OT an ST referrals obtained by request of primary PT, Amy Marriot; pt states he is not sure that he wants to do all 3 therapies at the same time - wants to think about it before he schedules OT and ST evals   Patient Stated Goals Pt's goal for therapy is to help get coat on and move better overall.   Currently in Pain? No/denies                         [facility] Adult PT Treatment/Exercise - 02/03/15 1325  Transfers   Transfers Sit to Stand;Stand to Sit   Sit to Stand 5: Supervision  on [employer]   Ambulation/Gait   Ambulation/Gait Yes   Ambulation/Gait Assistance 5: Supervision   Ambulation/Gait Assistance Details cues given to incr. step length and to incr. arm swing   Ambulation Distance (Feet) 220 Feet   Assistive device None   Gait Pattern Decreased arm swing - right;Decreased arm swing - left;Step-through pattern   Ambulation Surface Level;Indoor   Knee/Hip Exercises: Stretches   Active Hamstring Stretch Both;1 rep;30 seconds   Gastroc Stretch Both;30 seconds   Knee/Hip Exercises: Aerobic   Stationary Bike SciFit level 2.0 x 5" with UE's and LE's   Shoulder Exercises: IT sales professional 1 rep;30 seconds   Wall Stretch -  Flexion 1 rep;30 seconds  "Y" stretch           PWR [facility]) - 02/03/15 2113    PWR Step multi-directional stepping - forward, back and side x 10 reps each with CGA   PWR! Up 10 reps - reaching down and up    NeuroRe-ed:  Big marching - in place - with CGA; forwards 30' and 30' backwards x 2 reps with CGA to min A  Stepping over/back of balance beam x 10 reps with CGA for improved SLS         PT Education - 02/03/15 2142    Education provided Yes   Education Details multidirectional stepping and PWR! Up   Person(s) Educated Patient   Methods Explanation;Demonstration;Handout   Comprehension Verbalized understanding;Returned demonstration             PT Long Term Goals - 01/26/15 1450    PT LONG TERM GOAL #1   Title Pt will be independent with HEP for improved transfers, balance, and gait.  TARGET 02/26/15   Time 4   Period Weeks   Status New   PT LONG TERM GOAL #2   Title Pt will perform at least 8 of 10 reps of sit<>stand transfers from less than 18 inch surface without UE support and no posterior lean, for improved transfer efficiency and safety.   Time 4   Period Weeks   Status New   PT LONG TERM GOAL #3   Title Pt will improve Functional Gait Assessment score to at least 22/30 for decreased fall risk.   Baseline 4   Period Weeks   Status New   PT LONG TERM GOAL #4   Title Pt will verbalize understanding of fall prevention in the home environment.   Time 4   Period Weeks   Status New   PT LONG TERM GOAL #5   Title Pt will verbalize understanding of local Parkinson's disease-related resources.   Time 4   Period Weeks   Status New               Plan - 02/03/15 2150    Clinical Impression Statement Pt presents with decr. balance skills and gait deviations including decr. step length and decr. arm swing; he is able to minimize gait deviations with verbal cues; pt reported fatigue at end of session   Pt will benefit from skilled therapeutic  intervention in order to improve on the following deficits Abnormal gait;Decreased balance;Decreased mobility;Decreased strength;Impaired flexibility;Postural dysfunction   Rehab Potential Good   PT Frequency 2x / week   PT Duration 4 weeks   PT Treatment/Interventions ADLs/Self Care Home Management;Therapeutic exercise;Therapeutic activities;Functional mobility training;Gait training;Balance training;Neuromuscular re-education;Patient/family education   PT Next  Visit Plan check HEP: cont PWR! moves; gait/balance training   PT Home Exercise Plan Multi- directional stepping with reaching and PWR! up (in seated position)   Consulted and Agree with Plan of Care Patient        Problem List Patient Active Problem List   Diagnosis Date Noted  . History of colonic polyps   . Hx of adenomatous colonic polyps 01/04/2015  . Lumbar canal stenosis 12/04/2013  . Aortic stenosis 10/21/2013  . Hypoxemia 04/16/2012  . Primary central sleep apnea 04/16/2012  . Obstructive sleep apnea (adult) (pediatric) 04/16/2012  . Orthostatic hypotension 04/16/2012  . Sleep disturbance, unspecified 04/16/2012  . Unspecified hereditary and idiopathic peripheral neuropathy 04/16/2012  . Parkinson's 04/16/2012  . COLONIC POLYPS, ADENOMATOUS 11/19/2008  . GERD 11/19/2008  . HEMATOCHEZIA 11/19/2008  . GI BLEEDING 11/19/2008  . HYPERLIPIDEMIA-MIXED 07/03/2008  . HYPERTENSION, UNSPECIFIED 07/03/2008    Alda Lea, PT 02/03/2015, 10:04 PM  [facility] [address] [place], [place], [address] Phone: [phone]   Fax:  [phone]  Name: LARRIS POUSSON MRN: [id number] Date of Birth: [birth year]

## 2015-02-03 NOTE — Patient Instructions (Signed)
Provided pictures of multidirectional stepping with reaching - forward, back and side  Sitting - PWR! Up exercise

## 2015-02-04 ENCOUNTER — Ambulatory Visit: Payer: Medicare Other

## 2015-02-04 DIAGNOSIS — R29898 Other symptoms and signs involving the musculoskeletal system: Secondary | ICD-10-CM | POA: Diagnosis not present

## 2015-02-04 DIAGNOSIS — R258 Other abnormal involuntary movements: Secondary | ICD-10-CM | POA: Diagnosis not present

## 2015-02-04 DIAGNOSIS — R269 Unspecified abnormalities of gait and mobility: Secondary | ICD-10-CM | POA: Diagnosis not present

## 2015-02-04 DIAGNOSIS — M6281 Muscle weakness (generalized): Secondary | ICD-10-CM | POA: Diagnosis not present

## 2015-02-04 DIAGNOSIS — R293 Abnormal posture: Secondary | ICD-10-CM | POA: Diagnosis not present

## 2015-02-04 NOTE — Patient Instructions (Signed)
SCAPULA: Retraction   Using green band, tied around a post or between a door jam (shut the door). Pinch shoulder blades together, while holding band with both hand. Do not shrug shoulders. Hold _2__ seconds. _10__ reps per set, _2__ sets per day, __3_ days per week  Copyright  VHI. All rights reserved.

## 2015-02-04 NOTE — Therapy (Signed)
[facility] [address] [place], [place], [address] Phone: [phone]   Fax:  [phone]  Physical Therapy Treatment  Patient Details  Name: [name] MRN: [id number] Date of Birth: 09-18-1931 Referring Provider: Rexene Alberts  Encounter Date: 02/04/2015      PT End of Session - 02/04/15 1034    Visit Number 3   Number of Visits 9   Date for PT Re-Evaluation 03/25/15   Authorization Type Medicare-G code every 10th visit   PT Start Time 0849   PT Stop Time 0928   PT Time Calculation (min) 39 min   Equipment Utilized During Treatment Gait belt   Activity Tolerance Patient tolerated treatment well   Behavior During Therapy [facility] for tasks assessed/performed      Past Medical History  Diagnosis Date  . Hypercholesteremia   . Parkinson's disease ([place])   . Hypertension   . Sleep disorder   . Ex-smoker   . Cancer [facility])     h/o  skin  cancer  . Sleep apnea     ?? sleep apnea....tested 2013 @ [facility]     . GERD (gastroesophageal reflux disease)   . Arthritis   . Lumbar stenosis     Past Surgical History  Procedure Laterality Date  . Tonsillectomy Bilateral   . Meniscus repair      right knee  2000  . Eye surgery      bilateral  cataracts  . Appendectomy    . Back surgery  11/2013  . Colonoscopy  11/17/2008    Rourk: Sessile polyp in the cecum status post saline-assisted piecemeal polypectomy, resolution clipping and epinephrine injection therapy performed. Shallow sigmoid diverticula.. Tubular adenoma  . Colonoscopy  11/19/2008    Fields: Large amount of liquid blood with clots seen throughout the colon. Previous Boston resolution clip remained in place. Purple spot seen in the lateral aspect polypectomy site, 3 resolution clips placed here. 6 mm sessile ascending colon polyp seen and not manipulated.  . Colonoscopy  11/25/2008    Rourk: Blood-tinged colonic effluent, suspect recurrent post  polypectomy bleeding status post placement of 3 clips on the polypectomy site on top of the 3 clips that already existed, one had fallen off since last procedure. One small polyp distal to the cecum not manipulated  . Colonoscopy  12/06/2009    Rourk: Internal hemorrhoids, scattered left-sided diverticula, cecal polyps, one snared and subsequently clipped, 2 adjacent diminutive polyps ablated. tubular adenoma  . Colonoscopy N/A 01/21/2015    Procedure: COLONOSCOPY;  Surgeon: Daneil Dolin, MD;  Location[facility];  Service: Endoscopy;  Laterality: N/A;  730 - moved to 8:30 - Dr. has meeting, office talked to pt    There were no vitals filed for this visit.  Visit Diagnosis:  Posture abnormality  Muscle weakness of lower extremity  Abnormality of gait  Rigidity (muscles)  Bradykinesia      Subjective Assessment - 02/04/15 0851    Subjective Pt denied falls or changes since last visit.    Patient Stated Goals Pt's goal for therapy is to help get coat on and move better overall.   Currently in Pain? No/denies                         [facility] Adult PT Treatment/Exercise - 02/04/15 1032    Ambulation/Gait   Ambulation/Gait Yes   Ambulation/Gait Assistance 5: Supervision   Ambulation/Gait Assistance Details Cues to improve  reciprocal arm swing, upright posture, stride length, and narrow BOS while traversing corners.    Ambulation Distance (Feet) 115 Feet  x4   Assistive device None   Gait Pattern Decreased arm swing - right;Decreased arm swing - left;Step-through pattern;Decreased stride length;Decreased trunk rotation   Ambulation Surface Level;Indoor      Neuro re-ed: Pt reviewed and performed standing PWR! HEP and performed new seated PWR! HEP with demonstration, tactile and verbal cues for technique. Performed with S for safety.  x10 reps/activity. Pt also performed seated B scapular retraction x10reps no band and 2x10 with green theraband.  PT assessed  rhomboid/mid trap strength prior to initiating scapular retraction (L side: 4/5 and R side: 3+/5).         PT Education - 02/04/15 1033    Education provided Yes   Education Details PT reviewed PWR! standing HEP and provided seated PWR! exerices. PT also provided B shoulder retraction HEP.   Person(s) Educated Patient   Methods Explanation;Demonstration;Tactile cues;Verbal cues;Handout   Comprehension Verbalized understanding;Returned demonstration;Need further instruction             PT Long Term Goals - 02/04/15 1036    PT LONG TERM GOAL #1   Title Pt will be independent with HEP for improved transfers, balance, and gait.  TARGET 02/26/15   Time 4   Period Weeks   Status On-going   PT LONG TERM GOAL #2   Title Pt will perform at least 8 of 10 reps of sit<>stand transfers from less than 18 inch surface without UE support and no posterior lean, for improved transfer efficiency and safety.   Time 4   Period Weeks   Status On-going   PT LONG TERM GOAL #3   Title Pt will improve Functional Gait Assessment score to at least 22/30 for decreased fall risk.   Baseline 4   Period Weeks   Status On-going   PT LONG TERM GOAL #4   Title Pt will verbalize understanding of fall prevention in the home environment.   Time 4   Period Weeks   Status On-going   PT LONG TERM GOAL #5   Title Pt will verbalize understanding of local Parkinson's disease-related resources.   Time 4   Period Weeks   Status On-going               Plan - 02/04/15 1034    Clinical Impression Statement Pt continues to require cues for proper technique during PWR! HEP, as he has difficulty with rotation and large amplitude arm movements 2/2 pt reported chronic L rotator cuff issues. PT modified HEP as needed. Pt responded well to cues during gait training and was able to perform reciprocal arm swing IND during last 200' of amb. Continue with POC.   Pt will benefit from skilled therapeutic intervention in  order to improve on the following deficits Abnormal gait;Decreased balance;Decreased mobility;Decreased strength;Impaired flexibility;Postural dysfunction   Rehab Potential Good   PT Frequency 2x / week   PT Duration 4 weeks   PT Treatment/Interventions ADLs/Self Care Home Management;Therapeutic exercise;Therapeutic activities;Functional mobility training;Gait training;Balance training;Neuromuscular re-education;Patient/family education   PT Next Visit Plan Review seated PWR! HEP, dynamic gait/balance training.   PT Home Exercise Plan Multi- directional stepping with reaching and PWR! up (in seated position)   Consulted and Agree with Plan of Care Patient        Problem List Patient Active Problem List   Diagnosis Date Noted  . History of colonic polyps   .  Hx of adenomatous colonic polyps 01/04/2015  . Lumbar canal stenosis 12/04/2013  . Aortic stenosis 10/21/2013  . Hypoxemia 04/16/2012  . Primary central sleep apnea 04/16/2012  . Obstructive sleep apnea (adult) (pediatric) 04/16/2012  . Orthostatic hypotension 04/16/2012  . Sleep disturbance, unspecified 04/16/2012  . Unspecified hereditary and idiopathic peripheral neuropathy 04/16/2012  . Parkinson's 04/16/2012  . COLONIC POLYPS, ADENOMATOUS 11/19/2008  . GERD 11/19/2008  . HEMATOCHEZIA 11/19/2008  . GI BLEEDING 11/19/2008  . HYPERLIPIDEMIA-MIXED 07/03/2008  . HYPERTENSION, UNSPECIFIED 07/03/2008    Shalese Strahan L 02/04/2015, 10:37 AM  [facility] [address] [place], [place], [address] Phone: [phone]   Fax:  [phone]  Name: DERWIN POKORNEY MRN: [id number] Date of Birth: July 25, 1931    Geoffry Paradise, PT,DPT 02/04/2015 10:37 AM Phone: [phone] Fax: [phone]

## 2015-02-08 ENCOUNTER — Ambulatory Visit: Payer: Medicare Other | Admitting: Physical Therapy

## 2015-02-08 DIAGNOSIS — R258 Other abnormal involuntary movements: Secondary | ICD-10-CM | POA: Diagnosis not present

## 2015-02-08 DIAGNOSIS — R269 Unspecified abnormalities of gait and mobility: Secondary | ICD-10-CM

## 2015-02-08 DIAGNOSIS — R29898 Other symptoms and signs involving the musculoskeletal system: Secondary | ICD-10-CM | POA: Diagnosis not present

## 2015-02-08 DIAGNOSIS — R293 Abnormal posture: Secondary | ICD-10-CM | POA: Diagnosis not present

## 2015-02-08 DIAGNOSIS — M6281 Muscle weakness (generalized): Secondary | ICD-10-CM | POA: Diagnosis not present

## 2015-02-08 NOTE — Therapy (Signed)
[facility] [address] [place], [place], [address] Phone: [phone]   Fax:  [phone]  Physical Therapy Treatment  Patient Details  Name: [name] MRN: [id number] Date of Birth: 01-07-1932 Referring Provider: Rexene Alberts  Encounter Date: 02/08/2015      PT End of Session - 02/08/15 1254    Visit Number 4   Number of Visits 9   Date for PT Re-Evaluation 03/25/15   Authorization Type Medicare-G code every 10th visit   PT Start Time 0847   PT Stop Time 0931   PT Time Calculation (min) 44 min   Equipment Utilized During Treatment Gait belt   Activity Tolerance Patient tolerated treatment well   Behavior During Therapy [facility] for tasks assessed/performed      Past Medical History  Diagnosis Date  . Hypercholesteremia   . Parkinson's disease ([place])   . Hypertension   . Sleep disorder   . Ex-smoker   . Cancer [facility])     h/o  skin  cancer  . Sleep apnea     ?? sleep apnea....tested 2013 @ [facility]     . GERD (gastroesophageal reflux disease)   . Arthritis   . Lumbar stenosis     Past Surgical History  Procedure Laterality Date  . Tonsillectomy Bilateral   . Meniscus repair      right knee  2000  . Eye surgery      bilateral  cataracts  . Appendectomy    . Back surgery  11/2013  . Colonoscopy  11/17/2008    Rourk: Sessile polyp in the cecum status post saline-assisted piecemeal polypectomy, resolution clipping and epinephrine injection therapy performed. Shallow sigmoid diverticula.. Tubular adenoma  . Colonoscopy  11/19/2008    Fields: Large amount of liquid blood with clots seen throughout the colon. Previous [place] resolution clip remained in place. Purple spot seen in the lateral aspect polypectomy site, 3 resolution clips placed here. 6 mm sessile ascending colon polyp seen and not manipulated.  . Colonoscopy  11/25/2008    Rourk: Blood-tinged colonic effluent, suspect recurrent post  polypectomy bleeding status post placement of 3 clips on the polypectomy site on top of the 3 clips that already existed, one had fallen off since last procedure. One small polyp distal to the cecum not manipulated  . Colonoscopy  12/06/2009    Rourk: Internal hemorrhoids, scattered left-sided diverticula, cecal polyps, one snared and subsequently clipped, 2 adjacent diminutive polyps ablated. tubular adenoma  . Colonoscopy N/A 01/21/2015    Procedure: COLONOSCOPY;  Surgeon: Daneil Dolin, MD;  Location: [facility];  Service: Endoscopy;  Laterality: N/A;  730 - moved to 8:30 - Dr. has meeting, office talked to pt    There were no vitals filed for this visit.  Visit Diagnosis:  Abnormality of gait  Posture abnormality      Subjective Assessment - 02/08/15 0848    Subjective Pt denies significant changes or falls. Pt requesting help with PWR! HEP.   Patient Stated Goals Pt's goal for therapy is to help get coat on and move better overall.   Currently in Pain? No/denies                         [facility] Adult PT Treatment/Exercise - 02/08/15 0001    Ambulation/Gait   Ambulation/Gait Yes   Ambulation/Gait Assistance 5: Supervision;4: Min guard   Ambulation/Gait Assistance Details cueing for wider BOS, increased reciprocal arm swing. (S)  for all gait with exception of min guard required due to R toe catch x1.   Ambulation Distance (Feet) 420 Feet  2 x210'   Assistive device None   Gait Pattern Decreased arm swing - right;Decreased arm swing - left;Step-through pattern;Decreased stride length;Decreased trunk rotation   Ambulation Surface Level;Indoor           PWR [facility]) - 02/08/15 [id number]    PWR! Up 15   PWR! Rock 10  limited by B shoulder pain   PWR! Twist 15   PWR! Step 15          Balance Exercises - 02/08/15 1247    Balance Exercises: Standing   Balance Beam forward/retro stepping on balck foam beam with min guard-min A, intermittent UE support 3 x10 reps  per direction, bilat.   Retro Gait 3 reps;Other (comment)  3 x50' with cueing for wider BOS, increased hip/knee flexion   Other Standing Exercises Standing without UE support performing multi-directional stepping onto colored targets 3 x20 reps with black foam beam between pt and anterior target to promote increased hip/knee flexion when touching targets; when touching lateral targets, cued pt to widen BOS and turn body toward target (to increase stability with direction changes during gait).            PT Education - 02/08/15 [phone]    Education provided Yes   Education Details Reviewed seated PWR! basic 4; substituted A/P weight shifting in staggered stance fro PWR! Rock due to B shoulder pain.   Person(s) Educated Patient   Methods Explanation;Demonstration;Tactile cues;Verbal cues;Handout   Comprehension Verbalized understanding;Returned demonstration             PT Long Term Goals - 02/04/15 1036    PT LONG TERM GOAL #1   Title Pt will be independent with HEP for improved transfers, balance, and gait.  TARGET 02/26/15   Time 4   Period Weeks   Status On-going   PT LONG TERM GOAL #2   Title Pt will perform at least 8 of 10 reps of sit<>stand transfers from less than 18 inch surface without UE support and no posterior lean, for improved transfer efficiency and safety.   Time 4   Period Weeks   Status On-going   PT LONG TERM GOAL #3   Title Pt will improve Functional Gait Assessment score to at least 22/30 for decreased fall risk.   Baseline 4   Period Weeks   Status On-going   PT LONG TERM GOAL #4   Title Pt will verbalize understanding of fall prevention in the home environment.   Time 4   Period Weeks   Status On-going   PT LONG TERM GOAL #5   Title Pt will verbalize understanding of local Parkinson's disease-related resources.   Time 4   Period Weeks   Status On-going               Plan - 02/08/15 1255    Clinical Impression Statement Session focused on  use of PWR! seated exercises to promote pt awareness of postural deficits and low amplitude movements to promote more normalized posture/gait pattern. Also addressed direction changes during gait.   Pt will benefit from skilled therapeutic intervention in order to improve on the following deficits Abnormal gait;Decreased balance;Decreased mobility;Decreased strength;Impaired flexibility;Postural dysfunction   Rehab Potential Good   PT Frequency 2x / week   PT Duration 4 weeks   PT Treatment/Interventions ADLs/Self Care Home Management;Therapeutic exercise;Therapeutic activities;Functional mobility training;Gait training;Balance training;Neuromuscular  re-education;Patient/family education   PT Next Visit Plan dynamic standing balance, gait training, direction changes   Consulted and Agree with Plan of Care Patient        Problem List Patient Active Problem List   Diagnosis Date Noted  . History of colonic polyps   . Hx of adenomatous colonic polyps 01/04/2015  . Lumbar canal stenosis 12/04/2013  . Aortic stenosis 10/21/2013  . Hypoxemia 04/16/2012  . Primary central sleep apnea 04/16/2012  . Obstructive sleep apnea (adult) (pediatric) 04/16/2012  . Orthostatic hypotension 04/16/2012  . Sleep disturbance, unspecified 04/16/2012  . Unspecified hereditary and idiopathic peripheral neuropathy 04/16/2012  . Parkinson's 04/16/2012  . COLONIC POLYPS, ADENOMATOUS 11/19/2008  . GERD 11/19/2008  . HEMATOCHEZIA 11/19/2008  . GI BLEEDING 11/19/2008  . HYPERLIPIDEMIA-MIXED 07/03/2008  . HYPERTENSION, UNSPECIFIED 07/03/2008    Billie Ruddy, PT, DPT [facility] [address] [place], [place], [address] Phone: [phone]   Fax:  [phone] 02/08/2015, 12:58 PM   Name: [name] MRN: [id number] Date of Birth: 1931/09/03

## 2015-02-08 NOTE — Patient Instructions (Addendum)
Weight Shift: Stepping Forward and Back    Stand with one hand on countertop and weight on left leg, step backward and transfer weight onto right leg, lifting toes of left foot. Then step forward and transfer weight onto right leg, lifting heel of left foot. Repeat _10__ times each way. Then stand on right leg and move left leg. Do this exercise twice per day.

## 2015-02-11 ENCOUNTER — Ambulatory Visit: Payer: Medicare Other

## 2015-02-11 DIAGNOSIS — R258 Other abnormal involuntary movements: Secondary | ICD-10-CM

## 2015-02-11 DIAGNOSIS — R29898 Other symptoms and signs involving the musculoskeletal system: Secondary | ICD-10-CM | POA: Diagnosis not present

## 2015-02-11 DIAGNOSIS — R293 Abnormal posture: Secondary | ICD-10-CM | POA: Diagnosis not present

## 2015-02-11 DIAGNOSIS — R269 Unspecified abnormalities of gait and mobility: Secondary | ICD-10-CM

## 2015-02-11 DIAGNOSIS — M6281 Muscle weakness (generalized): Secondary | ICD-10-CM | POA: Diagnosis not present

## 2015-02-11 NOTE — Therapy (Signed)
[facility] [address] [place], [place], [address] Phone: [phone]   Fax:  [phone]  Physical Therapy Treatment  Patient Details  Name: [name] MRN: [id number] Date of Birth: 08-31-1931 Referring Provider: Rexene Alberts  Encounter Date: 02/11/2015      PT End of Session - 02/11/15 [id number]    Visit Number 5   Number of Visits 9   Date for PT Re-Evaluation 03/25/15   Authorization Type Medicare-G code every 10th visit   PT Start Time 0845   PT Stop Time 0928   PT Time Calculation (min) 43 min   Equipment Utilized During Treatment Gait belt   Activity Tolerance Patient tolerated treatment well   Behavior During Therapy [facility] for tasks assessed/performed      Past Medical History  Diagnosis Date  . Hypercholesteremia   . Parkinson's disease ([place])   . Hypertension   . Sleep disorder   . Ex-smoker   . Cancer [facility])     h/o  skin  cancer  . Sleep apnea     ?? sleep apnea....tested 2013 @ [facility]     . GERD (gastroesophageal reflux disease)   . Arthritis   . Lumbar stenosis     Past Surgical History  Procedure Laterality Date  . Tonsillectomy Bilateral   . Meniscus repair      right knee  2000  . Eye surgery      bilateral  cataracts  . Appendectomy    . Back surgery  11/2013  . Colonoscopy  11/17/2008    Rourk: Sessile polyp in the cecum status post saline-assisted piecemeal polypectomy, resolution clipping and epinephrine injection therapy performed. Shallow sigmoid diverticula.. Tubular adenoma  . Colonoscopy  11/19/2008    Fields: Large amount of liquid blood with clots seen throughout the colon. Previous [place] resolution clip remained in place. Purple spot seen in the lateral aspect polypectomy site, 3 resolution clips placed here. 6 mm sessile ascending colon polyp seen and not manipulated.  . Colonoscopy  11/25/2008    Rourk: Blood-tinged colonic effluent, suspect recurrent post  polypectomy bleeding status post placement of 3 clips on the polypectomy site on top of the 3 clips that already existed, one had fallen off since last procedure. One small polyp distal to the cecum not manipulated  . Colonoscopy  12/06/2009    Rourk: Internal hemorrhoids, scattered left-sided diverticula, cecal polyps, one snared and subsequently clipped, 2 adjacent diminutive polyps ablated. tubular adenoma  . Colonoscopy N/A 01/21/2015    Procedure: COLONOSCOPY;  Surgeon: Daneil Dolin, MD;  Location: [facility];  Service: Endoscopy;  Laterality: N/A;  730 - moved to 8:30 - Dr. has meeting, office talked to pt    There were no vitals filed for this visit.  Visit Diagnosis:  Abnormality of gait  Bradykinesia  Posture abnormality      Subjective Assessment - 02/11/15 0846    Subjective Pt denied falls or changes since last visit.    Patient Stated Goals Pt's goal for therapy is to help get coat on and move better overall.   Currently in Pain? No/denies                         [facility] Adult PT Treatment/Exercise - 02/11/15 0847    Transfers   Transfers Sit to Stand;Stand to Sit   Sit to Stand 6: Modified independent (Device/Increase time);Without upper extremity assist;From chair/3-in-1  x10 reps  Stand to Sit 6: Modified independent (Device/Increase time);Without upper extremity assist;To chair/3-in-1  x10 reps   Ambulation/Gait   Ambulation/Gait Yes   Ambulation/Gait Assistance 5: Supervision;4: Min guard   Ambulation/Gait Assistance Details Cues to improve stride length, widen stance, reciprocal arm swing and heel strike. Pt experienced one LOB episodes, but self corrected with stepping strategy. Pt amb. with and without walking poles to improve arm swing. Pt also amb. in // bars with beam in center, to encourage to amb. with widen stance.    Ambulation Distance (Feet) --  1000' outdoors, 92' with poles and 230'x2 no poles,40' in //bars   Assistive device  None   Gait Pattern Decreased arm swing - right;Decreased arm swing - left;Step-through pattern;Decreased stride length;Decreased trunk rotation;Narrow base of support   Ambulation Surface Level;Unlevel;Indoor;Outdoor;Paved             Balance Exercises - 02/11/15 1302    Balance Exercises: Standing   Other Standing Exercises Performed on compliant surface (red mat) Standing in //bars, no UE: pt performed 180 turns while stepping on dots to improve stride length, cues to improve trunk rotation and upright posture. Pt also performed B LE marches while performing reaching tasks across body to improve trunk rotation, while amb. on red mats 4x15', with min guard to min A. Pt performed ant/post stepping over 2" beam in // bars x10/LE and B LE heel taps to dots 3x5/LE at counter, to improve heel strike and wt. shifting, cues for technique.                 PT Long Term Goals - 02/04/15 1036    PT LONG TERM GOAL #1   Title Pt will be independent with HEP for improved transfers, balance, and gait.  TARGET 02/26/15   Time 4   Period Weeks   Status On-going   PT LONG TERM GOAL #2   Title Pt will perform at least 8 of 10 reps of sit<>stand transfers from less than 18 inch surface without UE support and no posterior lean, for improved transfer efficiency and safety.   Time 4   Period Weeks   Status On-going   PT LONG TERM GOAL #3   Title Pt will improve Functional Gait Assessment score to at least 22/30 for decreased fall risk.   Baseline 4   Period Weeks   Status On-going   PT LONG TERM GOAL #4   Title Pt will verbalize understanding of fall prevention in the home environment.   Time 4   Period Weeks   Status On-going   PT LONG TERM GOAL #5   Title Pt will verbalize understanding of local Parkinson's disease-related resources.   Time 4   Period Weeks   Status On-going               Plan - 02/11/15 1305    Clinical Impression Statement Pt demonstrated progress, as he  was able to perform high level balance activities with min guard to min A. Pt continues to require cues to improve reciprocal arm swing during gait, which improved after pt utilized walking poles during amb. Continue with POC.   Pt will benefit from skilled therapeutic intervention in order to improve on the following deficits Abnormal gait;Decreased balance;Decreased mobility;Decreased strength;Impaired flexibility;Postural dysfunction   Rehab Potential Good   PT Frequency 2x / week   PT Duration 4 weeks   PT Treatment/Interventions ADLs/Self Care Home Management;Therapeutic exercise;Therapeutic activities;Functional mobility training;Gait training;Balance training;Neuromuscular re-education;Patient/family education   PT  Next Visit Plan Continue gait training (to improve heel strike and arm swing), turns and balance activities which encourage trunk rotation   PT Home Exercise Plan Multi- directional stepping with reaching and PWR! up (in seated position)   Consulted and Agree with Plan of Care Patient        Problem List Patient Active Problem List   Diagnosis Date Noted  . History of colonic polyps   . Hx of adenomatous colonic polyps 01/04/2015  . Lumbar canal stenosis 12/04/2013  . Aortic stenosis 10/21/2013  . Hypoxemia 04/16/2012  . Primary central sleep apnea 04/16/2012  . Obstructive sleep apnea (adult) (pediatric) 04/16/2012  . Orthostatic hypotension 04/16/2012  . Sleep disturbance, unspecified 04/16/2012  . Unspecified hereditary and idiopathic peripheral neuropathy 04/16/2012  . Parkinson's 04/16/2012  . COLONIC POLYPS, ADENOMATOUS 11/19/2008  . GERD 11/19/2008  . HEMATOCHEZIA 11/19/2008  . GI BLEEDING 11/19/2008  . HYPERLIPIDEMIA-MIXED 07/03/2008  . HYPERTENSION, UNSPECIFIED 07/03/2008    Miller,Jennifer L 02/11/2015, 1:08 PM  [facility] [address] [place], [place], [address] Phone: [phone]    Fax:  [phone]  Name: [name] MRN: [id number] Date of Birth: August 06, 1931    Geoffry Paradise, PT,DPT 02/11/2015 1:08 PM Phone: [phone] Fax: [phone]

## 2015-02-16 ENCOUNTER — Ambulatory Visit: Payer: Medicare Other | Admitting: Physical Therapy

## 2015-02-16 DIAGNOSIS — R29898 Other symptoms and signs involving the musculoskeletal system: Secondary | ICD-10-CM | POA: Diagnosis not present

## 2015-02-16 DIAGNOSIS — R269 Unspecified abnormalities of gait and mobility: Secondary | ICD-10-CM

## 2015-02-16 DIAGNOSIS — R293 Abnormal posture: Secondary | ICD-10-CM | POA: Diagnosis not present

## 2015-02-16 DIAGNOSIS — R258 Other abnormal involuntary movements: Secondary | ICD-10-CM

## 2015-02-16 DIAGNOSIS — M6281 Muscle weakness (generalized): Secondary | ICD-10-CM | POA: Diagnosis not present

## 2015-02-16 NOTE — Therapy (Signed)
[facility] [address] [place], [place], [address] Phone: [phone]   Fax:  [phone]  Physical Therapy Treatment  Patient Details  Name: [name] MRN: [id number] Date of Birth: [birth year] Referring Provider: Rexene Alberts  Encounter Date: 02/16/2015      PT End of Session - 02/16/15 1307    Visit Number 6   Number of Visits 9   Date for PT Re-Evaluation 03/25/15   Authorization Type Medicare-G code every 10th visit   PT Start Time 0848   PT Stop Time 0928   PT Time Calculation (min) 40 min   Equipment Utilized During Treatment Gait belt   Activity Tolerance Patient tolerated treatment well   Behavior During Therapy [facility] for tasks assessed/performed      Past Medical History  Diagnosis Date  . Hypercholesteremia   . Parkinson's disease ([place])   . Hypertension   . Sleep disorder   . Ex-smoker   . Cancer [facility])     h/o  skin  cancer  . Sleep apnea     ?? sleep apnea....tested 2013 @ [facility]     . GERD (gastroesophageal reflux disease)   . Arthritis   . Lumbar stenosis     Past Surgical History  Procedure Laterality Date  . Tonsillectomy Bilateral   . Meniscus repair      right knee  2000  . Eye surgery      bilateral  cataracts  . Appendectomy    . Back surgery  11/2013  . Colonoscopy  11/17/2008    Rourk: Sessile polyp in the cecum status post saline-assisted piecemeal polypectomy, resolution clipping and epinephrine injection therapy performed. Shallow sigmoid diverticula.. Tubular adenoma  . Colonoscopy  11/19/2008    Fields: Large amount of liquid blood with clots seen throughout the colon. Previous Boston resolution clip remained in place. Purple spot seen in the lateral aspect polypectomy site, 3 resolution clips placed here. 6 mm sessile ascending colon polyp seen and not manipulated.  . Colonoscopy  11/25/2008    Rourk: Blood-tinged colonic effluent, suspect recurrent post  polypectomy bleeding status post placement of 3 clips on the polypectomy site on top of the 3 clips that already existed, one had fallen off since last procedure. One small polyp distal to the cecum not manipulated  . Colonoscopy  12/06/2009    Rourk: Internal hemorrhoids, scattered left-sided diverticula, cecal polyps, one snared and subsequently clipped, 2 adjacent diminutive polyps ablated. tubular adenoma  . Colonoscopy N/A 01/21/2015    Procedure: COLONOSCOPY;  Surgeon: Daneil Dolin, MD;  Location: [facility];  Service: Endoscopy;  Laterality: N/A;  730 - moved to 8:30 - Dr. has meeting, office talked to pt    There were no vitals filed for this visit.  Visit Diagnosis:  Abnormality of gait  Bradykinesia      Subjective Assessment - 02/16/15 0849    Subjective Trying to do the exercises once a day.  No problems with exercises.   Patient Stated Goals Pt's goal for therapy is to help get coat on and move better overall.   Currently in Pain? No/denies                         [facility] Adult PT Treatment/Exercise - 02/16/15 0853    Transfers   Transfers Sit to Stand;Stand to Sit   Sit to Stand 6: Modified independent (Device/Increase time);Without upper extremity assist;From chair/3-in-1   Stand to  Sit 6: Modified independent (Device/Increase time);Without upper extremity assist;To chair/3-in-1   Number of Reps 10 reps;Other sets (comment)  each from 20", 18", then 16 inch surface   Transfer Cueing Cues for increased forward lean   Comments Also 10 reps from 14 inch surface, with pt having some forward hesitation, needs cues to use momentum and rocking, use of propelling arms forward to improve coordination and timing of sit<>stand transfer, especially from low surfaces.   Ambulation/Gait   Ambulation/Gait Yes   Ambulation/Gait Assistance 5: Supervision;4: Min guard   Ambulation/Gait Assistance Details Cues provided to relax arms during gait, for improved stride  length, improved heelstrike with gait   Ambulation Distance (Feet) 600 Feet   Assistive device None   Gait Pattern Decreased arm swing - right;Decreased arm swing - left;Step-through pattern;Decreased stride length;Decreased trunk rotation;Narrow base of support   Ambulation Surface Level;Indoor   Gait Comments Forward/backward walking 4 x 20 ft, marching forward 20 ft x 4 reps with therapist assist for coordinating reciprocal arm swing,    High Level Balance   High Level Balance Activities Weight-shifting turns;Marching turns  Quarter turns, to R and L, each 2 reps   High Level Balance Comments At counter, practiced lateral weightshifting, stagger stance forward/back weightshifting x 10 reps each, for improved weightshifting with functional activities and ADLs, per pt's report of standing activities increase back discomfort and fatigue if he performs activites for too long at a time (ie-washing dishes, loading/unloading dishwasher).  Practiced short distance walking with carrying item(s) with turns to change directions, multiple reps, with no loss of balance.                     PT Long Term Goals - 02/04/15 1036    PT LONG TERM GOAL #1   Title Pt will be independent with HEP for improved transfers, balance, and gait.  TARGET 02/26/15   Time 4   Period Weeks   Status On-going   PT LONG TERM GOAL #2   Title Pt will perform at least 8 of 10 reps of sit<>stand transfers from less than 18 inch surface without UE support and no posterior lean, for improved transfer efficiency and safety.   Time 4   Period Weeks   Status On-going   PT LONG TERM GOAL #3   Title Pt will improve Functional Gait Assessment score to at least 22/30 for decreased fall risk.   Baseline 4   Period Weeks   Status On-going   PT LONG TERM GOAL #4   Title Pt will verbalize understanding of fall prevention in the home environment.   Time 4   Period Weeks   Status On-going   PT LONG TERM GOAL #5   Title Pt  will verbalize understanding of local Parkinson's disease-related resources.   Time 4   Period Weeks   Status On-going               Plan - 02/16/15 1307    Clinical Impression Statement Pt able to demonstrate improved turns with gait, with minimal initial cues for increased foot clearance and widened BOS.  Time focused this session on how weightshifting and wider BOS movements can assist movement patterns during functional activities at home.  Pt will continue to benefit from further skilled PT to address posture, balance, functional strength and gait.   Pt will benefit from skilled therapeutic intervention in order to improve on the following deficits Abnormal gait;Decreased balance;Decreased mobility;Decreased strength;Impaired flexibility;Postural dysfunction  Rehab Potential Good   PT Frequency 2x / week   PT Duration 4 weeks   PT Treatment/Interventions ADLs/Self Care Home Management;Therapeutic exercise;Therapeutic activities;Functional mobility training;Gait training;Balance training;Neuromuscular re-education;Patient/family education   PT Next Visit Plan Discuss formal walking program, review HEP, and continue to address turns, gait, balance activities   PT Home Exercise Plan Multi- directional stepping with reaching and PWR! up (in seated position)   Consulted and Agree with Plan of Care Patient        Problem List Patient Active Problem List   Diagnosis Date Noted  . History of colonic polyps   . Hx of adenomatous colonic polyps 01/04/2015  . Lumbar canal stenosis 12/04/2013  . Aortic stenosis 10/21/2013  . Hypoxemia 04/16/2012  . Primary central sleep apnea 04/16/2012  . Obstructive sleep apnea (adult) (pediatric) 04/16/2012  . Orthostatic hypotension 04/16/2012  . Sleep disturbance, unspecified 04/16/2012  . Unspecified hereditary and idiopathic peripheral neuropathy 04/16/2012  . Parkinson's 04/16/2012  . COLONIC POLYPS, ADENOMATOUS 11/19/2008  . GERD  11/19/2008  . HEMATOCHEZIA 11/19/2008  . GI BLEEDING 11/19/2008  . HYPERLIPIDEMIA-MIXED 07/03/2008  . HYPERTENSION, UNSPECIFIED 07/03/2008    Laneya Gasaway W. 02/16/2015, 1:12 PM  Frazier Butt., PT  [facility] [address] [place], [place], [address] Phone: [phone]   Fax:  [phone]  Name: AYUB ROUPE MRN: [id number] Date of Birth: 07-Apr-1931

## 2015-02-18 ENCOUNTER — Ambulatory Visit: Payer: Medicare Other | Attending: Neurology | Admitting: Physical Therapy

## 2015-02-18 DIAGNOSIS — R279 Unspecified lack of coordination: Secondary | ICD-10-CM | POA: Insufficient documentation

## 2015-02-18 DIAGNOSIS — R251 Tremor, unspecified: Secondary | ICD-10-CM | POA: Diagnosis not present

## 2015-02-18 DIAGNOSIS — R293 Abnormal posture: Secondary | ICD-10-CM

## 2015-02-18 DIAGNOSIS — R2681 Unsteadiness on feet: Secondary | ICD-10-CM | POA: Insufficient documentation

## 2015-02-18 DIAGNOSIS — M25619 Stiffness of unspecified shoulder, not elsewhere classified: Secondary | ICD-10-CM | POA: Insufficient documentation

## 2015-02-18 DIAGNOSIS — R258 Other abnormal involuntary movements: Secondary | ICD-10-CM | POA: Insufficient documentation

## 2015-02-18 DIAGNOSIS — R29898 Other symptoms and signs involving the musculoskeletal system: Secondary | ICD-10-CM | POA: Insufficient documentation

## 2015-02-18 DIAGNOSIS — R269 Unspecified abnormalities of gait and mobility: Secondary | ICD-10-CM | POA: Insufficient documentation

## 2015-02-18 NOTE — Patient Instructions (Signed)
WALKING  Walking is a great form of exercise to increase your strength, endurance and overall fitness.  A walking program can help you start slowly and gradually build endurance as you go.  Everyone's ability is different, so each person's starting point will be different.  You do not have to follow them exactly.  The are just samples. You should simply find out what's right for you and stick to that program.   In the beginning, you'll start off walking 2-3 times a day for short distances.  As you get stronger, you'll be walking further at just 1-2 times per day.  A. You Can Walk For A Certain Length Of Time Each Day    Walk 5 minutes 3 times per day.  Increase 2 minutes every 2 days (3 times per day).  Work up to 25-30 minutes (1-2 times per day).   Example:   Day 1-2 5 minutes 3 times per day   Day 7-8 12 minutes 2-3 times per day   Day 13-14 25 minutes 1-2 times per day  B. You Can Walk For a Certain Distance Each Day     Distance can be substituted for time.    Example:   3 trips to mailbox (at road)   3 trips to corner of block   3 trips around the block    -Make sure that when you are walking, you are paying attention to using your best posture, taking long strides and swinging your arms.  This helps to reinforce the best walking pattern. -You can look into the possibility of walking poles or trekking poles (you can find at [employer] or sporting goods stores).  If you purchase a set of walking poles, then you can bring them into PT to get adjusted.

## 2015-02-18 NOTE — Therapy (Signed)
[facility] [address] [place], [place], [address] Phone: [phone]   Fax:  [phone]  Physical Therapy Treatment  Patient Details  Name: [name] MRN: [id number] Date of Birth: 1931/06/26 Referring Provider: Rexene Alberts  Encounter Date: 02/18/2015      PT End of Session - 02/18/15 1209    Visit Number 7   Number of Visits 9   Date for PT Re-Evaluation 03/25/15   Authorization Type Medicare-G code every 10th visit   PT Start Time 0853   PT Stop Time 0931   PT Time Calculation (min) 38 min   Activity Tolerance Patient tolerated treatment well   Behavior During Therapy [facility] for tasks assessed/performed      Past Medical History  Diagnosis Date  . Hypercholesteremia   . Parkinson's disease ([place])   . Hypertension   . Sleep disorder   . Ex-smoker   . Cancer [facility])     h/o  skin  cancer  . Sleep apnea     ?? sleep apnea....tested 2013 @ [facility]     . GERD (gastroesophageal reflux disease)   . Arthritis   . Lumbar stenosis     Past Surgical History  Procedure Laterality Date  . Tonsillectomy Bilateral   . Meniscus repair      right knee  2000  . Eye surgery      bilateral  cataracts  . Appendectomy    . Back surgery  11/2013  . Colonoscopy  11/17/2008    Rourk: Sessile polyp in the cecum status post saline-assisted piecemeal polypectomy, resolution clipping and epinephrine injection therapy performed. Shallow sigmoid diverticula.. Tubular adenoma  . Colonoscopy  11/19/2008    Fields: Large amount of liquid blood with clots seen throughout the colon. Previous Boston resolution clip remained in place. Purple spot seen in the lateral aspect polypectomy site, 3 resolution clips placed here. 6 mm sessile ascending colon polyp seen and not manipulated.  . Colonoscopy  11/25/2008    Rourk: Blood-tinged colonic effluent, suspect recurrent post polypectomy bleeding status post placement of 3  clips on the polypectomy site on top of the 3 clips that already existed, one had fallen off since last procedure. One small polyp distal to the cecum not manipulated  . Colonoscopy  12/06/2009    Rourk: Internal hemorrhoids, scattered left-sided diverticula, cecal polyps, one snared and subsequently clipped, 2 adjacent diminutive polyps ablated. tubular adenoma  . Colonoscopy N/A 01/21/2015    Procedure: COLONOSCOPY;  Surgeon: Daneil Dolin, MD;  Location: [facility];  Service: Endoscopy;  Laterality: N/A;  730 - moved to 8:30 - Dr. has meeting, office talked to pt    There were no vitals filed for this visit.  Visit Diagnosis:  Abnormality of gait  Bradykinesia  Posture abnormality      Subjective Assessment - 02/18/15 0854    Subjective Nothing new to report.   Currently in Pain? No/denies               Neuro Re-education:  Review of HEP including posture (resisted scapular adduction at doorframe and seated PWR! Up) as well as step/weigthshift exercises near counter in forward/back/side direction.  Provided cues for addition of UE coordinated movements with step and weigthshift exercises.  Pt has several losses of balance (he self-recovers) during step and weightshift exercises, requiring cues for deliberate movement patterns, large amplitude and coordination of movements.    PT adjusts bilateral walking poles to appropriate height for patient.  PT provides instruction on sequence of walking poles.  Pt does need to take several breaks to reset correct sequence of poles.      [facility] Adult PT Treatment/Exercise - 02/18/15 0917    Ambulation/Gait   Ambulation/Gait Yes   Ambulation/Gait Assistance 5: Supervision;4: Min guard   Ambulation/Gait Assistance Details Cues provided for sequencing of walking poles    Ambulation Distance (Feet) 500 Feet  indoors with walking poles, then 600 ft outdoors with poles   Assistive device None  bilateral walking poles   Gait Pattern  Decreased arm swing - right;Decreased arm swing - left;Step-through pattern;Decreased stride length;Decreased trunk rotation;Narrow base of support  improved posture, step length with use of walking poles/cues   Ambulation Surface Level;Indoor   Self-Care   Self-Care Other Self-Care Comments   Other Self-Care Comments  Discussed and provided patient with walking program information to improve walking endurance.  Discussed benefits of using bilateral walking poles with walking exercises and how to obtain.                PT Education - 02/18/15 1208    Education provided Yes   Education Details Walking program added as part of HEP   Person(s) Educated Patient   Methods Explanation;Demonstration;Handout   Comprehension Verbalized understanding;Returned demonstration             PT Long Term Goals - 02/04/15 1036    PT LONG TERM GOAL #1   Title Pt will be independent with HEP for improved transfers, balance, and gait.  TARGET 02/26/15   Time 4   Period Weeks   Status On-going   PT LONG TERM GOAL #2   Title Pt will perform at least 8 of 10 reps of sit<>stand transfers from less than 18 inch surface without UE support and no posterior lean, for improved transfer efficiency and safety.   Time 4   Period Weeks   Status On-going   PT LONG TERM GOAL #3   Title Pt will improve Functional Gait Assessment score to at least 22/30 for decreased fall risk.   Baseline 4   Period Weeks   Status On-going   PT LONG TERM GOAL #4   Title Pt will verbalize understanding of fall prevention in the home environment.   Time 4   Period Weeks   Status On-going   PT LONG TERM GOAL #5   Title Pt will verbalize understanding of local Parkinson's disease-related resources.   Time 4   Period Weeks   Status On-going               Plan - 02/18/15 1209    Clinical Impression Statement Pt demonstrates improved step length and posture with cues and with use of bilateral walking poles  during session today.  With review of HEP, pt requires cues for deliberate movement patterns and timing/coordination of stepping exercises ultimately for balance strategy use.  Pt will continue to benefit from further skilled PT to address posture, balance, functional strength and gait.   Pt will benefit from skilled therapeutic intervention in order to improve on the following deficits Abnormal gait;Decreased balance;Decreased mobility;Decreased strength;Impaired flexibility;Postural dysfunction   Rehab Potential Good   PT Frequency 2x / week   PT Duration 4 weeks   PT Treatment/Interventions ADLs/Self Care Home Management;Therapeutic exercise;Therapeutic activities;Functional mobility training;Gait training;Balance training;Neuromuscular re-education;Patient/family education   PT Next Visit Plan Review walking program; begin checking LTGs and discuss POC   PT Home Exercise Plan Multi- directional stepping with reaching  and PWR! up (in seated position)   Consulted and Agree with Plan of Care Patient        Problem List Patient Active Problem List   Diagnosis Date Noted  . History of colonic polyps   . Hx of adenomatous colonic polyps 01/04/2015  . Lumbar canal stenosis 12/04/2013  . Aortic stenosis 10/21/2013  . Hypoxemia 04/16/2012  . Primary central sleep apnea 04/16/2012  . Obstructive sleep apnea (adult) (pediatric) 04/16/2012  . Orthostatic hypotension 04/16/2012  . Sleep disturbance, unspecified 04/16/2012  . Unspecified hereditary and idiopathic peripheral neuropathy 04/16/2012  . Parkinson's 04/16/2012  . COLONIC POLYPS, ADENOMATOUS 11/19/2008  . GERD 11/19/2008  . HEMATOCHEZIA 11/19/2008  . GI BLEEDING 11/19/2008  . HYPERLIPIDEMIA-MIXED 07/03/2008  . HYPERTENSION, UNSPECIFIED 07/03/2008    Lauralie Blacksher W. 02/18/2015, 12:12 PM  Frazier Butt., PT [facility] [address] [place], [place], [address] Phone:  [phone]   Fax:  [phone]  Name: KEYLER GRIGSBY MRN: [id number] Date of Birth: 09/25/1931

## 2015-02-23 ENCOUNTER — Ambulatory Visit: Payer: Medicare Other | Admitting: Physical Therapy

## 2015-02-23 ENCOUNTER — Ambulatory Visit: Payer: Medicare Other | Admitting: Occupational Therapy

## 2015-02-23 DIAGNOSIS — R29898 Other symptoms and signs involving the musculoskeletal system: Secondary | ICD-10-CM

## 2015-02-23 DIAGNOSIS — R293 Abnormal posture: Secondary | ICD-10-CM

## 2015-02-23 DIAGNOSIS — R258 Other abnormal involuntary movements: Secondary | ICD-10-CM

## 2015-02-23 DIAGNOSIS — R2681 Unsteadiness on feet: Secondary | ICD-10-CM | POA: Diagnosis not present

## 2015-02-23 DIAGNOSIS — R269 Unspecified abnormalities of gait and mobility: Secondary | ICD-10-CM

## 2015-02-23 DIAGNOSIS — R278 Other lack of coordination: Secondary | ICD-10-CM

## 2015-02-23 DIAGNOSIS — R251 Tremor, unspecified: Secondary | ICD-10-CM

## 2015-02-23 DIAGNOSIS — R279 Unspecified lack of coordination: Secondary | ICD-10-CM

## 2015-02-23 DIAGNOSIS — M25619 Stiffness of unspecified shoulder, not elsewhere classified: Secondary | ICD-10-CM

## 2015-02-23 NOTE — Patient Instructions (Signed)

## 2015-02-23 NOTE — Therapy (Signed)
[place] [address] [place], [place], [address] Phone: [phone]   Fax:  [phone]  Occupational Therapy Evaluation  Patient Details  Name: [name] MRN: [id number] Date of Birth: 1931-04-12 Referring Provider: Dr. Rexene Alberts  Encounter Date: 02/23/2015      OT End of Session - 02/23/15 2231    Visit Number 1   Number of Visits 17   Date for OT Re-Evaluation 04/23/15   Authorization Type Medicare, [employer] (G-code needed)   Authorization - Visit Number 1   Authorization - Number of Visits 10   OT Start Time 0802   OT Stop Time 0851   OT Time Calculation (min) 49 min   Activity Tolerance Patient tolerated treatment well   Behavior During Therapy [facility] for tasks assessed/performed      Past Medical History  Diagnosis Date  . Hypercholesteremia   . Parkinson's disease ([place])   . Hypertension   . Sleep disorder   . Ex-smoker   . Cancer [facility])     h/o  skin  cancer  . Sleep apnea     ?? sleep apnea....tested 2013 @ [facility]     . GERD (gastroesophageal reflux disease)   . Arthritis   . Lumbar stenosis     Past Surgical History  Procedure Laterality Date  . Tonsillectomy Bilateral   . Meniscus repair      right knee  2000  . Eye surgery      bilateral  cataracts  . Appendectomy    . Back surgery  11/2013  . Colonoscopy  11/17/2008    Rourk: Sessile polyp in the cecum status post saline-assisted piecemeal polypectomy, resolution clipping and epinephrine injection therapy performed. Shallow sigmoid diverticula.. Tubular adenoma  . Colonoscopy  11/19/2008    Fields: Large amount of liquid blood with clots seen throughout the colon. Previous Boston resolution clip remained in place. Purple spot seen in the lateral aspect polypectomy site, 3 resolution clips placed here. 6 mm sessile ascending colon polyp seen and not manipulated.  . Colonoscopy  11/25/2008    Rourk: Blood-tinged colonic  effluent, suspect recurrent post polypectomy bleeding status post placement of 3 clips on the polypectomy site on top of the 3 clips that already existed, one had fallen off since last procedure. One small polyp distal to the cecum not manipulated  . Colonoscopy  12/06/2009    Rourk: Internal hemorrhoids, scattered left-sided diverticula, cecal polyps, one snared and subsequently clipped, 2 adjacent diminutive polyps ablated. tubular adenoma  . Colonoscopy N/A 01/21/2015    Procedure: COLONOSCOPY;  Surgeon: Daneil Dolin, MD;  Location: [facility];  Service: Endoscopy;  Laterality: N/A;  730 - moved to 8:30 - Dr. has meeting, office talked to pt    There were no vitals filed for this visit.  Visit Diagnosis:  Bradykinesia  Rigidity  Decreased coordination  Unsteadiness  Stiffness of joint, shoulder region, unspecified laterality  Tremors of nervous system  Abnormal posture      Subjective Assessment - 02/23/15 0804    Subjective  Pt reports difficulty donning jacket and doing "small things"   Pertinent History PD diagnosis approx 5 years ago; hx of low back surgery 11/2013; hx of bilateral RTC injuries   Patient Stated Goals improving donning coat, improve fine motor skills   Currently in Pain? No/denies           [facility] OT Assessment - 02/23/15 0001    Assessment   Diagnosis Parkinson's  disease    Referring Provider Dr. 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, hospital for [employer] Works on mowing the yard, goes up and down basement steps multiple times per day, rides exercise bike daily   ADL   Eating/Feeding --  min spills, using fork, difficulty cutting meat   Grooming --  incr time   Upper Body Bathing --  min difficulty drying off back   Lower Body Bathing Increased time   Upper Body Dressing --  difficulty buttons, unable to do collar button   Upper Body Dressing Patient Percentage --  difficulty donning/doffing jacket   Lower Body Dressing --  difficulty/incr time with socks, belt   Toilet Tranfer --  has higher toilet, difficulty w/ lower toliets without bar   Toileting - Clothing Manipulation Modified independent   Toileting -  Hygiene Modified Independent   Tub/Shower Transfer Modified independent  shower stall with gab bar and seat available   Transfers/Ambulation Related to ADL's difficulty getting up from chair without arms, or from low, soft surface   IADL   Shopping Takes care of all shopping needs independently   Light Housekeeping --  performs cleaning tasks.   Prior Level of Function Meal Prep wife performs most cooking   Meal Prep --  pt grills and does simple cooking tasks   Programmer, applications own vehicle   Medication Management --  alarms, wife loads organizer--difficulty picking up pills   Physiological scientist financial matters independently (budgets, writes checks, pays rent, bills goes to bank), collects and keeps track of income   Mobility   Mobility Status Independent   Mobility Status Comments difficulty turning with quick turns, ?possible L foot freezing/hesitation   Written Expression   Dominant Hand Right   Handwriting Mild micrographia  95% legible for sentence level   Vision - History   Baseline Vision Bifocals   Vision Assessment   Diplopia Assessment --  pt denies   Cognition   Overall Cognitive Status Within Functional Limits for tasks assessed  denies change; will  assess further in functional context prn   Observation/Other Assessments   Observations Rounded shoulders, posterior pelvic tilt, forward head   Other Surveys  Select   Physical Performance Test   Yes   Simulated Eating Time (seconds) 11.13   Simulated Eating Comments holds spoon at end due to bradykinesia   Donning Doffing Jacket Time (seconds) 61.43sec with gown, [id number] (slick lined jacket with short sleeves).  Pt reports incr difficulty with non slick lined jackets and over long sleeves.   Donning Doffing Jacket Comments Buttoning/unbuttoning 3 buttons on table in 95.16sec   Coordination   9 Hole Peg Test Right;Left   Right 9 Hole Peg Test 33.65   Left 9 Hole Peg Test 35.60   Box and Blocks R-45, L-41 blocks   Tremors resting tremors in LUE   Tone   Assessment Location Right Upper Extremity;Left Upper Extremity  ROM / Strength   AROM / PROM / Strength AROM   AROM   Overall AROM  Deficits   Overall AROM Comments approx 90% IR; elbow ext WNL in lower shoulder ranges, cueing given for bradykinesia during ROM assessment   AROM Assessment Site Shoulder   Right/Left Shoulder Right;Left   Right Shoulder Flexion 110 Degrees  with -30* elbow ext   Left Shoulder Flexion 110 Degrees  with -25* elbow ext   RUE Tone   RUE Tone --  mild-mod rigidity   LUE Tone   LUE Tone --  mild to mod rigidity                         OT Education - 02/23/15 2141    Education provided Yes   Education Details difference between OT/PT; how bradykinesia affects ADLs; eval results; tremor in PD and treatment   Person(s) Educated Patient   Methods Explanation   Comprehension Verbalized understanding          OT Short Term Goals - 02/23/15 2247    OT SHORT TERM GOAL #1   Title Pt will be independent with PD-specific HEP.--check STGs 03/23/15   Time 4   Period Weeks   Status New   OT SHORT TERM GOAL #2   Title Pt will verbalize understanding of ways to prevent future  complications and appropriate community resources prn.   Time 4   Period Weeks   Status New   OT SHORT TERM GOAL #3   Title Pt will improve ability to don/doff jacket as shown by improving time on PPT#4 (with gown) in 50sec or less.   Baseline 61.43sec   Time 4   Period Weeks   Status New   OT SHORT TERM GOAL #4   Title Pt will be able to write 3 sentences with 100% accuracy and no significant incr in size.   Time 4   Period Weeks   Status New           OT Long Term Goals - 02/23/15 2251    OT LONG TERM GOAL #1   Title Pt will verbalize understanding of strategies/AE to incr ease with ADLs prn.--check LTGs 04/23/15   Time 8   Period Weeks   Status New   OT LONG TERM GOAL #2   Title Pt will improve bradykinesia, functional reaching, coordination for ADLs as shown by improving score on box and blocks test by at least 4 blocks bilaterally.   Baseline R-45 blocks, L-41 blocks   Time 8   Period Weeks   Status New   OT LONG TERM GOAL #3   Title Pt will improve ability to don/doff jacket as shown by improving time on PPT#4 (with gown) in 40sec or less.   Baseline 61.43sec   Time 8   Period Weeks   Status New   OT LONG TERM GOAL #4   Title Pt will be able to button/unbutton 3 buttons on tabletop in 70sec or less for incr ease with dressing.   Baseline 95.16sec   Time 8   Period Weeks   Status New   OT LONG TERM GOAL #5   Title Pt will report incr ease with drying back, donning socks, eating, and picking up pills.   Time 8   Period Weeks   Status New   Long Term Additional Goals   Additional Long Term Goals Yes   OT LONG TERM GOAL #6   Title  Pt will demo at least 120* bilateral shoulder flex with -20* or greater elbow extension for improved overhead reaching.   Baseline R 110* shoulder flex with -30* elbow ext;  L 110* shoulder flex with -25* elbow ext   Time 8   Status New               Plan - 03-16-2015 05/08/2232    Clinical Impression Statement Pt is an 82 y.o.  male with hx of Parkinson's disease x5 years.  Pt also reports hx of low back surgery 11/2013, hx of bilateral old RTC injurys (from falls), and sleep apnea.  Pt reports increasing stiffness and difficulty with coordination/ADLs.  Pt presents with bradykinesia, rigidity, decr coordination, tremors, postural abnormalities, decr balance/functional mobility for ADLs, decr ROM.  Pt would benefit from occupational therapy to address these deficits in order to incr ease with ADLs/IADLs, prevent future complications, and improve quality of life.   Pt will benefit from skilled therapeutic intervention in order to improve on the following deficits (Retired) Decreased mobility;Impaired UE functional use;Decreased knowledge of use of DME;Decreased balance;Decreased coordination;Decreased range of motion;Impaired tone  bradykinesia   Rehab Potential Good   OT Frequency 2x / week   OT Duration 8 weeks  +eval   OT Treatment/Interventions Self-care/ADL training;Moist Heat;Electrical Stimulation;Fluidtherapy;Contrast Bath;Passive range of motion;Therapeutic activities;Cognitive remediation/compensation;DME and/or AE instruction;Parrafin;Cryotherapy;Therapeutic exercises;Manual Therapy;Neuromuscular education;Splinting;Ultrasound;Energy conservation;Therapeutic exercise;Functional Mobility Training;Patient/family education   Plan initiate HEP   Recommended Other Services current with PT; has ST referral   Consulted and Agree with Plan of Care Patient          G-Codes - 03/16/15 2303-05-09    Functional Assessment Tool Used drying back, donning socks, eating, and picking up pills; PPT#4 61.43sec; buttoning/unbuttoning 3 buttons on tabletop 95.16sec, writing with 95% legibility and min decr in size   Functional Limitation Self care   Self Care Current Status [id number]) At least 40 percent but less than 60 percent impaired, limited or restricted   Self Care Goal Status [id number]) At least 20 percent but less than 40 percent  impaired, limited or restricted      Problem List Patient Active Problem List   Diagnosis Date Noted  . History of colonic polyps   . Hx of adenomatous colonic polyps 01/04/2015  . Lumbar canal stenosis 12/04/2013  . Aortic stenosis 10/21/2013  . Hypoxemia 04/16/2012  . Primary central sleep apnea 04/16/2012  . Obstructive sleep apnea (adult) (pediatric) 04/16/2012  . Orthostatic hypotension 04/16/2012  . Sleep disturbance, unspecified 04/16/2012  . Unspecified hereditary and idiopathic peripheral neuropathy 04/16/2012  . Parkinson's 04/16/2012  . COLONIC POLYPS, ADENOMATOUS 11/19/2008  . GERD 11/19/2008  . HEMATOCHEZIA 11/19/2008  . GI BLEEDING 11/19/2008  . HYPERLIPIDEMIA-MIXED 07/03/2008  . HYPERTENSION, UNSPECIFIED 07/03/2008    [facility] 16-Mar-2015, 11:09 PM  [facility] [address] [place], [place], [address] Phone: [phone]   Fax:  [phone]  Name: ITALO LAFARY MRN: [id number] Date of Birth: 13-Apr-1931  Vianne Bulls, OTR/L March 16, 2015 11:09 PM

## 2015-02-23 NOTE — Therapy (Signed)
[place] [address] [place], [place], [address] Phone: [phone]   Fax:  [phone]  Physical Therapy Treatment  Patient Details  Name: [name] MRN: [id number] Date of Birth: 1931-05-26 Referring Provider: Rexene Alberts  Encounter Date: 02/23/2015      PT End of Session - 02/23/15 1920    Visit Number 8   Number of Visits 9   Date for PT Re-Evaluation 03/25/15   Authorization Type Medicare-G code every 10th visit   PT Start Time 0854   PT Stop Time 0932   PT Time Calculation (min) 38 min   Activity Tolerance Patient tolerated treatment well   Behavior During Therapy [facility] for tasks assessed/performed      Past Medical History  Diagnosis Date  . Hypercholesteremia   . Parkinson's disease ([place])   . Hypertension   . Sleep disorder   . Ex-smoker   . Cancer [facility])     h/o  skin  cancer  . Sleep apnea     ?? sleep apnea....tested 2013 @ [facility]     . GERD (gastroesophageal reflux disease)   . Arthritis   . Lumbar stenosis     Past Surgical History  Procedure Laterality Date  . Tonsillectomy Bilateral   . Meniscus repair      right knee  2000  . Eye surgery      bilateral  cataracts  . Appendectomy    . Back surgery  11/2013  . Colonoscopy  11/17/2008    Rourk: Sessile polyp in the cecum status post saline-assisted piecemeal polypectomy, resolution clipping and epinephrine injection therapy performed. Shallow sigmoid diverticula.. Tubular adenoma  . Colonoscopy  11/19/2008    Fields: Large amount of liquid blood with clots seen throughout the colon. Previous Boston resolution clip remained in place. Purple spot seen in the lateral aspect polypectomy site, 3 resolution clips placed here. 6 mm sessile ascending colon polyp seen and not manipulated.  . Colonoscopy  11/25/2008    Rourk: Blood-tinged colonic effluent, suspect recurrent post polypectomy bleeding status post placement of 3  clips on the polypectomy site on top of the 3 clips that already existed, one had fallen off since last procedure. One small polyp distal to the cecum not manipulated  . Colonoscopy  12/06/2009    Rourk: Internal hemorrhoids, scattered left-sided diverticula, cecal polyps, one snared and subsequently clipped, 2 adjacent diminutive polyps ablated. tubular adenoma  . Colonoscopy N/A 01/21/2015    Procedure: COLONOSCOPY;  Surgeon: Daneil Dolin, MD;  Location: [facility];  Service: Endoscopy;  Laterality: N/A;  730 - moved to 8:30 - Dr. has meeting, office talked to pt    There were no vitals filed for this visit.  Visit Diagnosis:  Abnormality of gait  Bradykinesia  Posture abnormality      Subjective Assessment - 02/23/15 0853    Subjective Nothing new going one today.   Currently in Pain? No/denies            [facility] PT Assessment - 02/23/15 0857    Functional Gait  Assessment   Gait assessed  Yes   Gait Level Surface Walks 20 ft in less than 7 sec but greater than 5.5 sec, uses assistive device, slower speed, mild gait deviations, or deviates 6-10 in outside of the 12 in walkway width.  6.33 sec   Change in Gait Speed Able to smoothly change walking speed without loss of balance or gait deviation. Deviate no more than  6 in outside of the 12 in walkway width.   Gait with Horizontal Head Turns Performs head turns smoothly with no change in gait. Deviates no more than 6 in outside 12 in walkway width   Gait with Vertical Head Turns Performs task with slight change in gait velocity (eg, minor disruption to smooth gait path), deviates 6 - 10 in outside 12 in walkway width or uses assistive device   Gait and Pivot Turn Pivot turns safely in greater than 3 sec and stops with no loss of balance, or pivot turns safely within 3 sec and stops with mild imbalance, requires small steps to catch balance.  2.66 sec extra steps   Step Over Obstacle Is able to step over one shoe box (4.5 in total  height) without changing gait speed. No evidence of imbalance.   Gait with Narrow Base of Support Ambulates less than 4 steps heel to toe or cannot perform without assistance.   Gait with Eyes Closed Walks 20 ft, uses assistive device, slower speed, mild gait deviations, deviates 6-10 in outside 12 in walkway width. Ambulates 20 ft in less than 9 sec but greater than 7 sec.  6.84 sec   Ambulating Backwards Walks 20 ft, uses assistive device, slower speed, mild gait deviations, deviates 6-10 in outside 12 in walkway width.   Steps Alternating feet, no rail.   Total Score 21   FGA comment: Scores <22/30 indicate increased fall risk.  Score on FGA has improved from 18/30.                      [facility] Adult PT Treatment/Exercise - 02/23/15 0857    Transfers   Transfers Sit to Stand;Stand to Sit   Sit to Stand 6: Modified independent (Device/Increase time);Without upper extremity assist;From chair/3-in-1   Five time sit to stand comments  13.25  initial hesitation on first trial   Stand to Sit 6: Modified independent (Device/Increase time);Without upper extremity assist;To chair/3-in-1   Number of Reps 10 reps;Other sets (comment)  from 18 in, then 16 in surfaces   Transfer Cueing Occasional cues for forward lean   Ambulation/Gait   Ambulation/Gait Yes   Ambulation/Gait Assistance 5: Supervision   Ambulation/Gait Assistance Details Initial cues for posture, step length, reciprocal arm swing with walking poles; pt sequences walking poles better today than last visit.   Ambulation Distance (Feet) 1000 Feet  outdoors with walking poles, then 400ft indoors no poles   Assistive device None  bilateral walking poles   Gait Pattern Decreased arm swing - right;Decreased arm swing - left;Step-through pattern;Decreased stride length;Decreased trunk rotation;Narrow base of support   Ambulation Surface Level   Gait Comments improved sequence with walking poles today.                 PT Education - 02/23/15 1920    Education provided Yes   Education Details Progress towards goals, discussion regarding POC, fall prevention, Power over Parkinson's group   Person(s) Educated Patient   Methods Explanation   Comprehension Verbalized understanding             PT Long Term Goals - 02/23/15 0856    PT LONG TERM GOAL #1   Title Pt will be independent with HEP for improved transfers, balance, and gait.  TARGET 02/26/15   Time 4   Period Weeks   Status On-going   PT LONG TERM GOAL #2   Title Pt will perform at least 8 of 10   reps of sit<>stand transfers from less than 18 inch surface without UE support and no posterior lean, for improved transfer efficiency and safety.   Time 4   Period Weeks   Status Achieved   PT LONG TERM GOAL #3   Title Pt will improve Functional Gait Assessment score to at least 22/30 for decreased fall risk.   Baseline 21/30 02/23/15   Time 4   Period Weeks   Status Not Met   PT LONG TERM GOAL #4   Title Pt will verbalize understanding of fall prevention in the home environment.   Time 4   Period Weeks   Status Achieved   PT LONG TERM GOAL #5   Title Pt will verbalize understanding of local Parkinson's disease-related resources.   Time 4   Period Weeks   Status On-going               Plan - 02/23/15 1925    Clinical Impression Statement Pt has met goal for improved sit<>stand transfers.  Pt has improved on, but has not met goal for FGA.  Pt continues to need cues for step length, posture, arm swing with gait, to offset bradykinesia and dereased timing/coordination of gait.  Pt would benefit from continued bout of therapy to address posture, balance, and gait (discussed renewal with pt today), but pt feels he wants to focus on OT at this time.  Discussed placing pt on hold for PT with potential to reassess when OT is completed-pt in agreement.   Pt will benefit from skilled therapeutic intervention in order to improve on the following  deficits Abnormal gait;Decreased balance;Decreased mobility;Decreased strength;Impaired flexibility;Postural dysfunction   Rehab Potential Good   PT Frequency 2x / week   PT Duration 4 weeks   PT Treatment/Interventions ADLs/Self Care Home Management;Therapeutic exercise;Therapeutic activities;Functional mobility training;Gait training;Balance training;Neuromuscular re-education;Patient/family education   PT Next Visit Plan Check remaining goals-HEP and discuss community PD-related fitness options; plan to place pt on hold after next visit until OT completed   PT Home Exercise Plan Multi- directional stepping with reaching and PWR! up (in seated position)   Consulted and Agree with Plan of Care Patient        Problem List Patient Active Problem List   Diagnosis Date Noted  . History of colonic polyps   . Hx of adenomatous colonic polyps 01/04/2015  . Lumbar canal stenosis 12/04/2013  . Aortic stenosis 10/21/2013  . Hypoxemia 04/16/2012  . Primary central sleep apnea 04/16/2012  . Obstructive sleep apnea (adult) (pediatric) 04/16/2012  . Orthostatic hypotension 04/16/2012  . Sleep disturbance, unspecified 04/16/2012  . Unspecified hereditary and idiopathic peripheral neuropathy 04/16/2012  . Parkinson's 04/16/2012  . COLONIC POLYPS, ADENOMATOUS 11/19/2008  . GERD 11/19/2008  . HEMATOCHEZIA 11/19/2008  . GI BLEEDING 11/19/2008  . HYPERLIPIDEMIA-MIXED 07/03/2008  . HYPERTENSION, UNSPECIFIED 07/03/2008    Estellar Cadena W. 02/23/2015, 7:30 PM Frazier Butt., PT [place] [address] [place], [place], [address] Phone: [phone]   Fax:  [phone]  Name: [name] MRN: [id number] Date of Birth: May 02, 1931

## 2015-02-25 ENCOUNTER — Ambulatory Visit: Payer: Medicare Other | Admitting: Physical Therapy

## 2015-02-25 DIAGNOSIS — R293 Abnormal posture: Secondary | ICD-10-CM

## 2015-02-25 DIAGNOSIS — R269 Unspecified abnormalities of gait and mobility: Secondary | ICD-10-CM

## 2015-02-25 DIAGNOSIS — R258 Other abnormal involuntary movements: Secondary | ICD-10-CM

## 2015-02-25 DIAGNOSIS — R279 Unspecified lack of coordination: Secondary | ICD-10-CM | POA: Diagnosis not present

## 2015-02-25 DIAGNOSIS — R29898 Other symptoms and signs involving the musculoskeletal system: Secondary | ICD-10-CM | POA: Diagnosis not present

## 2015-02-25 DIAGNOSIS — R2681 Unsteadiness on feet: Secondary | ICD-10-CM | POA: Diagnosis not present

## 2015-02-25 NOTE — Therapy (Signed)
[facility] [place] [address] [place], [place], [address] Phone: [phone]   Fax:  [phone]  Physical Therapy Treatment  Patient Details  Name: [name] MRN: [id number] Date of Birth: 07/09/1931 Referring Provider: Rexene Alberts  Encounter Date: 02/25/2015      PT End of Session - 02/25/15 0923    Visit Number 9   Number of Visits 9   Date for PT Re-Evaluation 03/25/15   Authorization Type Medicare-G code every 10th visit   PT Start Time 0848   PT Stop Time 0917   PT Time Calculation (min) 29 min   Activity Tolerance Patient tolerated treatment well   Behavior During Therapy [facility] for tasks assessed/performed      Past Medical History  Diagnosis Date  . Hypercholesteremia   . Parkinson's disease ([place])   . Hypertension   . Sleep disorder   . Ex-smoker   . Cancer [facility])     h/o  skin  cancer  . Sleep apnea     ?? sleep apnea....tested 2013 @ [facility]     . GERD (gastroesophageal reflux disease)   . Arthritis   . Lumbar stenosis     Past Surgical History  Procedure Laterality Date  . Tonsillectomy Bilateral   . Meniscus repair      right knee  2000  . Eye surgery      bilateral  cataracts  . Appendectomy    . Back surgery  11/2013  . Colonoscopy  11/17/2008    Rourk: Sessile polyp in the cecum status post saline-assisted piecemeal polypectomy, resolution clipping and epinephrine injection therapy performed. Shallow sigmoid diverticula.. Tubular adenoma  . Colonoscopy  11/19/2008    Fields: Large amount of liquid blood with clots seen throughout the colon. Previous Boston resolution clip remained in place. Purple spot seen in the lateral aspect polypectomy site, 3 resolution clips placed here. 6 mm sessile ascending colon polyp seen and not manipulated.  . Colonoscopy  11/25/2008    Rourk: Blood-tinged colonic effluent, suspect recurrent post polypectomy bleeding status post placement of 3  clips on the polypectomy site on top of the 3 clips that already existed, one had fallen off since last procedure. One small polyp distal to the cecum not manipulated  . Colonoscopy  12/06/2009    Rourk: Internal hemorrhoids, scattered left-sided diverticula, cecal polyps, one snared and subsequently clipped, 2 adjacent diminutive polyps ablated. tubular adenoma  . Colonoscopy N/A 01/21/2015    Procedure: COLONOSCOPY;  Surgeon: Daneil Dolin, MD;  Location: [facility];  Service: Endoscopy;  Laterality: N/A;  730 - moved to 8:30 - Dr. has meeting, office talked to pt    There were no vitals filed for this visit.  Visit Diagnosis:  Bradykinesia  Abnormal posture  Abnormality of gait      Subjective Assessment - 02/25/15 0851    Subjective Nothing new to report.  Pt wants to finish PT for now and concentrate on OT.   Currently in Pain? No/denies          There Ex: Pt performs exercises as part of HEP today-needs cues for large amplitude, deliberate movement patterns, with increased intensity.  Pt does report that he does these exercises at least once per day.  -scapular retraction -seated PWR! Up -Standing forward step and weightshift -Standing side step and weigthshift -Standing back step and weightshift              [facility] Adult PT Treatment/Exercise -  03-09-2015 0001    Knee/Hip Exercises: Aerobic   Stationary Bike SciFit, Level 2.5, 8 minutes, 4 extremities, wtih cues to keep intensity >75-80 for increased intensity of movement.          Self Care:  Provided information to pt on optimal PD fitness -see instructions        PT Education - 03/09/15 0922    Education provided Yes   Education Details optimal fitness program for people with PD   Person(s) Educated Patient   Methods Explanation;Handout   Comprehension Verbalized understanding             PT Long Term Goals - 03-09-15 0924    PT LONG TERM GOAL #1   Title Pt will be independent with  HEP for improved transfers, balance, and gait.  TARGET 02/26/15   Time 4   Period Weeks   Status Achieved   PT LONG TERM GOAL #2   Title Pt will perform at least 8 of 10 reps of sit<>stand transfers from less than 18 inch surface without UE support and no posterior lean, for improved transfer efficiency and safety.   Time 4   Period Weeks   Status Achieved   PT LONG TERM GOAL #3   Title Pt will improve Functional Gait Assessment score to at least 22/30 for decreased fall risk.   Baseline 21/30 02/23/15   Time 4   Period Weeks   Status Not Met   PT LONG TERM GOAL #4   Title Pt will verbalize understanding of fall prevention in the home environment.   Time 4   Period Weeks   Status Achieved   PT LONG TERM GOAL #5   Title Pt will verbalize understanding of local Parkinson's disease-related resources.   Time 4   Period Weeks   Status Achieved               Plan - 2015-03-09 5634    Clinical Impression Statement Pt has met LTG #1, 2, 4, 5.  LTG #3 not met.  Pt has demonstrated functional improvements, however, he continues to need cues on increased amplitude, intensity and deliberateness of movement.  He does not want to continue PT at this time, as he wants to focus on OT.  Will place PT on hold until 03/25/15 (end of this POC) and determine if pt wants to resume PT at that point.   Pt will benefit from skilled therapeutic intervention in order to improve on the following deficits Abnormal gait;Decreased balance;Decreased mobility;Decreased strength;Impaired flexibility;Postural dysfunction   Rehab Potential Good   PT Frequency 2x / week   PT Duration 4 weeks   PT Treatment/Interventions ADLs/Self Care Home Management;Therapeutic exercise;Therapeutic activities;Functional mobility training;Gait training;Balance training;Neuromuscular re-education;Patient/family education   PT Next Visit Plan Place on hold until 03/25/15 (end of POC) and determine if pt wants to resume PT-plan renewal or  d/c at that time.   PT Home Exercise Plan Multi- directional stepping with reaching and PWR! up (in seated position)   Consulted and Agree with Plan of Care Patient          G-Codes - 2015-03-09 6171    Functional Assessment Tool Used 5x sit<>stand 13.25 sec, Functional Gait assessment 21/30  if pt resumes-will need new G-code   Functional Limitation Mobility: Walking and moving around   Mobility: Walking and Moving Around Goal Status [phone]) At least 20 percent but less than 40 percent impaired, limited or restricted   Mobility: Walking and Moving Around Discharge  Status [phone]) At least 20 percent but less than 40 percent impaired, limited or restricted      Problem List Patient Active Problem List   Diagnosis Date Noted  . History of colonic polyps   . Hx of adenomatous colonic polyps 01/04/2015  . Lumbar canal stenosis 12/04/2013  . Aortic stenosis 10/21/2013  . Hypoxemia 04/16/2012  . Primary central sleep apnea 04/16/2012  . Obstructive sleep apnea (adult) (pediatric) 04/16/2012  . Orthostatic hypotension 04/16/2012  . Sleep disturbance, unspecified 04/16/2012  . Unspecified hereditary and idiopathic peripheral neuropathy 04/16/2012  . Parkinson's 04/16/2012  . COLONIC POLYPS, ADENOMATOUS 11/19/2008  . GERD 11/19/2008  . HEMATOCHEZIA 11/19/2008  . GI BLEEDING 11/19/2008  . HYPERLIPIDEMIA-MIXED 07/03/2008  . HYPERTENSION, UNSPECIFIED 07/03/2008    MARRIOTT,AMY W. 02/25/2015, 1:15 PM  Frazier Butt., PT  [facility] [address] [place], [place], [address] Phone: [phone]   Fax:  [phone]  Name: [name] MRN: [id number] Date of Birth: 06/20/1931

## 2015-02-25 NOTE — Patient Instructions (Addendum)

## 2015-03-04 ENCOUNTER — Ambulatory Visit: Payer: Medicare Other | Admitting: Occupational Therapy

## 2015-03-04 DIAGNOSIS — R269 Unspecified abnormalities of gait and mobility: Secondary | ICD-10-CM | POA: Diagnosis not present

## 2015-03-04 DIAGNOSIS — R2681 Unsteadiness on feet: Secondary | ICD-10-CM | POA: Diagnosis not present

## 2015-03-04 DIAGNOSIS — R279 Unspecified lack of coordination: Secondary | ICD-10-CM | POA: Diagnosis not present

## 2015-03-04 DIAGNOSIS — R29898 Other symptoms and signs involving the musculoskeletal system: Secondary | ICD-10-CM

## 2015-03-04 DIAGNOSIS — M25619 Stiffness of unspecified shoulder, not elsewhere classified: Secondary | ICD-10-CM

## 2015-03-04 DIAGNOSIS — R258 Other abnormal involuntary movements: Secondary | ICD-10-CM | POA: Diagnosis not present

## 2015-03-04 DIAGNOSIS — R293 Abnormal posture: Secondary | ICD-10-CM | POA: Diagnosis not present

## 2015-03-04 DIAGNOSIS — R278 Other lack of coordination: Secondary | ICD-10-CM

## 2015-03-04 DIAGNOSIS — R251 Tremor, unspecified: Secondary | ICD-10-CM

## 2015-03-04 NOTE — Therapy (Signed)
[facility] [address] [place], [place], [address] Phone: [phone]   Fax:  [phone]  Occupational Therapy Treatment  Patient Details  Name: [name] MRN: [id number] Date of Birth: 1931-02-04 Referring Provider: Dr. Rexene Alberts  Encounter Date: 03/04/2015      OT End of Session - 03/04/15 1643    Visit Number 2   Number of Visits 17   Date for OT Re-Evaluation 04/23/15   Authorization Type Medicare, Aetna (G-code needed)   Authorization - Visit Number 2   Authorization - Number of Visits 10   OT Start Time [id number]   OT Stop Time 1400   OT Time Calculation (min) 43 min   Activity Tolerance Patient tolerated treatment well   Behavior During Therapy [facility] for tasks assessed/performed      Past Medical History  Diagnosis Date  . Hypercholesteremia   . Parkinson's disease ([place])   . Hypertension   . Sleep disorder   . Ex-smoker   . Cancer [facility])     h/o  skin  cancer  . Sleep apnea     ?? sleep apnea....tested 2013 @ [facility]     . GERD (gastroesophageal reflux disease)   . Arthritis   . Lumbar stenosis     Past Surgical History  Procedure Laterality Date  . Tonsillectomy Bilateral   . Meniscus repair      right knee  2000  . Eye surgery      bilateral  cataracts  . Appendectomy    . Back surgery  11/2013  . Colonoscopy  11/17/2008    Rourk: Sessile polyp in the cecum status post saline-assisted piecemeal polypectomy, resolution clipping and epinephrine injection therapy performed. Shallow sigmoid diverticula.. Tubular adenoma  . Colonoscopy  11/19/2008    Fields: Large amount of liquid blood with clots seen throughout the colon. Previous [place] resolution clip remained in place. Purple spot seen in the lateral aspect polypectomy site, 3 resolution clips placed here. 6 mm sessile ascending colon polyp seen and not manipulated.  . Colonoscopy  11/25/2008    Rourk: Blood-tinged colonic  effluent, suspect recurrent post polypectomy bleeding status post placement of 3 clips on the polypectomy site on top of the 3 clips that already existed, one had fallen off since last procedure. One small polyp distal to the cecum not manipulated  . Colonoscopy  12/06/2009    Rourk: Internal hemorrhoids, scattered left-sided diverticula, cecal polyps, one snared and subsequently clipped, 2 adjacent diminutive polyps ablated. tubular adenoma  . Colonoscopy N/A 01/21/2015    Procedure: COLONOSCOPY;  Surgeon: Daneil Dolin, MD;  Location[facility];  Service: Endoscopy;  Laterality: N/A;  730 - moved to 8:30 - Dr. has meeting, office talked to pt    There were no vitals filed for this visit.  Visit Diagnosis:  Bradykinesia  Abnormal posture  Rigidity  Decreased coordination  Unsteadiness  Stiffness of joint, shoulder region, unspecified laterality  Tremors of nervous system      Subjective Assessment - 03/04/15 1639    Subjective  Pt verbalized understanding of education provided and reports no pain with repositioning during session   Pertinent History PD diagnosis approx 5 years ago; hx of low back surgery 11/2013; hx of bilateral RTC injuries    Patient Stated Goals improving donning coat, improve fine motor skills   Currently in Pain? No/denies  OT Treatments/Exercises (OP) - 03/04/15 0001    Neurological Re-education Exercises   Other Exercises 1 Functional reaching in various planes in sitting incorporating wt. shift and trunk rotation and cueing for big amplitude movements.  Pt with min cueing for larger amplitude movements and instructed in use of PWR! hands for grasp and to avoid future complication by use of trunk rotation/wt shifts with reach.  Pt verbalized understanding.           PWR [facility]) - 03/04/15 1646    PWR! exercises Moves in sitting;Moves in supine   PWR! Up x10 with mod cues/difficulty   PWR! Rock pain initially  with LUE; therefore discontinued   PWR! Up 15   PWR! Rock 10 to each side   PWR! Twist 10 to each side   PWR! Step 10 to each side   Comments with mod cueing for max effort/bigger amplitude movements             OT Education - 03/04/15 1641    Education Details PWR! Moves (basic 4) in sitting;  Homework/use of big movements during car transfer, functional reach/grasp, and putting on seatbelt;  How bradykinesia/rigidity and decr trunk mobility can affect UE movement and pain.   Person(s) Educated Patient   Methods Explanation;Demonstration;Verbal cues;Handout   Comprehension Returned demonstration;Verbalized understanding;Verbal cues required;Tactile cues required;Need further instruction  mod cueing for max effort/bigger amplitude movements           OT Short Term Goals - 02/23/15 2247    OT SHORT TERM GOAL #1   Title Pt will be independent with PD-specific HEP.--check STGs 03/23/15   Time 4   Period Weeks   Status New   OT SHORT TERM GOAL #2   Title Pt will verbalize understanding of ways to prevent future complications and appropriate community resources prn.   Time 4   Period Weeks   Status New   OT SHORT TERM GOAL #3   Title Pt will improve ability to don/doff jacket as shown by improving time on PPT#4 (with gown) in 50sec or less.   Baseline 61.43sec   Time 4   Period Weeks   Status New   OT SHORT TERM GOAL #4   Title Pt will be able to write 3 sentences with 100% accuracy and no significant incr in size.   Time 4   Period Weeks   Status New           OT Long Term Goals - 02/23/15 2251    OT LONG TERM GOAL #1   Title Pt will verbalize understanding of strategies/AE to incr ease with ADLs prn.--check LTGs 04/23/15   Time 8   Period Weeks   Status New   OT LONG TERM GOAL #2   Title Pt will improve bradykinesia, functional reaching, coordination for ADLs as shown by improving score on box and blocks test by at least 4 blocks bilaterally.   Baseline R-45  blocks, L-41 blocks   Time 8   Period Weeks   Status New   OT LONG TERM GOAL #3   Title Pt will improve ability to don/doff jacket as shown by improving time on PPT#4 (with gown) in 40sec or less.   Baseline 61.43sec   Time 8   Period Weeks   Status New   OT LONG TERM GOAL #4   Title Pt will be able to button/unbutton 3 buttons on tabletop in 70sec or less for incr ease with dressing.   Baseline 95.16sec  Time 8   Period Weeks   Status New   OT LONG TERM GOAL #5   Title Pt will report incr ease with drying back, donning socks, eating, and picking up pills.   Time 8   Period Weeks   Status New   Long Term Additional Goals   Additional Long Term Goals Yes   OT LONG TERM GOAL #6   Title Pt will demo at least 120* bilateral shoulder flex with -20* or greater elbow extension for improved overhead reaching.   Baseline R 110* shoulder flex with -30* elbow ext;  L 110* shoulder flex with -25* elbow ext   Time 8   Status New               Plan - 03/04/15 1643    Clinical Impression Statement Pt demo improved movement quality/amplitude with cueing for big amplitude movements today and would benefit from repetition/reinforcement to incr awareness of movement quality and carryover/calibration for ADLs.   Plan Review PWR! Moves in sitting; initiate coordination HEP/PWR! hands   Recommended Other Services on hold for PT; has ST referral, but not scheduled   Consulted and Agree with Plan of Care Patient        Problem List Patient Active Problem List   Diagnosis Date Noted  . History of colonic polyps   . Hx of adenomatous colonic polyps 01/04/2015  . Lumbar canal stenosis 12/04/2013  . Aortic stenosis 10/21/2013  . Hypoxemia 04/16/2012  . Primary central sleep apnea 04/16/2012  . Obstructive sleep apnea (adult) (pediatric) 04/16/2012  . Orthostatic hypotension 04/16/2012  . Sleep disturbance, unspecified 04/16/2012  . Unspecified hereditary and idiopathic peripheral  neuropathy 04/16/2012  . Parkinson's 04/16/2012  . COLONIC POLYPS, ADENOMATOUS 11/19/2008  . GERD 11/19/2008  . HEMATOCHEZIA 11/19/2008  . GI BLEEDING 11/19/2008  . HYPERLIPIDEMIA-MIXED 07/03/2008  . HYPERTENSION, UNSPECIFIED 07/03/2008    [facility] 03/04/2015, 4:55 PM  [place] [address] [place], [place], [address] Phone: [phone]   Fax:  [phone]  Name: RORIK CRUMMIE MRN: [id number] Date of Birth: May 19, 1931  Vianne Bulls, OTR/L [facility] [address] [place], [place]  [address] [phone] phone [phone] 03/04/2015 4:55 PM

## 2015-03-05 ENCOUNTER — Ambulatory Visit: Payer: Medicare Other | Admitting: Occupational Therapy

## 2015-03-05 DIAGNOSIS — R258 Other abnormal involuntary movements: Secondary | ICD-10-CM | POA: Diagnosis not present

## 2015-03-05 DIAGNOSIS — R293 Abnormal posture: Secondary | ICD-10-CM | POA: Diagnosis not present

## 2015-03-05 DIAGNOSIS — R2681 Unsteadiness on feet: Secondary | ICD-10-CM | POA: Diagnosis not present

## 2015-03-05 DIAGNOSIS — R279 Unspecified lack of coordination: Secondary | ICD-10-CM

## 2015-03-05 DIAGNOSIS — R269 Unspecified abnormalities of gait and mobility: Secondary | ICD-10-CM | POA: Diagnosis not present

## 2015-03-05 DIAGNOSIS — R29898 Other symptoms and signs involving the musculoskeletal system: Secondary | ICD-10-CM | POA: Diagnosis not present

## 2015-03-05 DIAGNOSIS — M25619 Stiffness of unspecified shoulder, not elsewhere classified: Secondary | ICD-10-CM

## 2015-03-05 DIAGNOSIS — R278 Other lack of coordination: Secondary | ICD-10-CM

## 2015-03-05 NOTE — Therapy (Signed)
[facility] [address] [place], [place], [address] Phone: [phone]   Fax:  [phone]  Occupational Therapy Treatment  Patient Details  Name: [name] MRN: [id number] Date of Birth: 1931/09/17 Referring Provider: Dr. Rexene Alberts  Encounter Date: 03/05/2015      OT End of Session - 03/04/15 1643    Visit Number 2   Number of Visits 17   Date for OT Re-Evaluation 04/23/15   Authorization Type Medicare, Aetna (G-code needed)   Authorization - Visit Number 2   Authorization - Number of Visits 10   OT Start Time [id number]   OT Stop Time 1400   OT Time Calculation (min) 43 min   Activity Tolerance Patient tolerated treatment well   Behavior During Therapy [facility] for tasks assessed/performed      Past Medical History  Diagnosis Date  . Hypercholesteremia   . Parkinson's disease ([place])   . Hypertension   . Sleep disorder   . Ex-smoker   . Cancer [facility])     h/o  skin  cancer  . Sleep apnea     ?? sleep apnea....tested 2013 @ [facility]     . GERD (gastroesophageal reflux disease)   . Arthritis   . Lumbar stenosis     Past Surgical History  Procedure Laterality Date  . Tonsillectomy Bilateral   . Meniscus repair      right knee  2000  . Eye surgery      bilateral  cataracts  . Appendectomy    . Back surgery  11/2013  . Colonoscopy  11/17/2008    Rourk: Sessile polyp in the cecum status post saline-assisted piecemeal polypectomy, resolution clipping and epinephrine injection therapy performed. Shallow sigmoid diverticula.. Tubular adenoma  . Colonoscopy  11/19/2008    Fields: Large amount of liquid blood with clots seen throughout the colon. Previous [place] resolution clip remained in place. Purple spot seen in the lateral aspect polypectomy site, 3 resolution clips placed here. 6 mm sessile ascending colon polyp seen and not manipulated.  . Colonoscopy  11/25/2008    Rourk: Blood-tinged colonic  effluent, suspect recurrent post polypectomy bleeding status post placement of 3 clips on the polypectomy site on top of the 3 clips that already existed, one had fallen off since last procedure. One small polyp distal to the cecum not manipulated  . Colonoscopy  12/06/2009    Rourk: Internal hemorrhoids, scattered left-sided diverticula, cecal polyps, one snared and subsequently clipped, 2 adjacent diminutive polyps ablated. tubular adenoma  . Colonoscopy N/A 01/21/2015    Procedure: COLONOSCOPY;  Surgeon: Daneil Dolin, MD;  [facility];  Service: Endoscopy;  Laterality: N/A;  730 - moved to 8:30 - Dr. has meeting, office talked to pt    There were no vitals filed for this visit.  Visit Diagnosis:  Bradykinesia  Abnormal posture  Rigidity  Decreased coordination  Unsteadiness  Stiffness of joint, shoulder region, unspecified laterality      Subjective Assessment - 03/05/15 1017    Pertinent History PD diagnosis approx 5 years ago; hx of low back surgery 11/2013; hx of bilateral RTC injuries    Patient Stated Goals improving donning coat, improve fine motor skills   Currently in Pain? Yes   Pain Score 4    Pain Location Back   Pain Orientation --   Pain Descriptors / Indicators Aching   Pain Type Chronic pain   Pain Onset More than a month ago   Pain  Frequency Intermittent   Aggravating Factors  standing   Pain Relieving Factors resting   Multiple Pain Sites No              coordination HEP issued, pt returned demonstration           [facility]WR [facility]) - 03/04/15 1646    PWR! exercises Moves in sitting,   PWR! Up x10 with min v.c. For all   PWR! Rock x10   PWR ! Twist  x10   PWR! step x10           Comments with min cueing for max effort/bigger amplitude movements, PWR! Hands for PWR! Up, rock, and step, twist was uncomfortable and discontinued             OT Education - 03/05/15 1004    Education provided Yes   Education Details PWR!  basic 4 seated review, PWR! hands, coordination HEP   Person(s) Educated Patient   Methods Explanation;Demonstration;Handout;Verbal cues   Comprehension Verbalized understanding;Returned demonstration          OT Short Term Goals - 02/23/15 2247    OT SHORT TERM GOAL #1   Title Pt will be independent with PD-specific HEP.--check STGs 03/23/15   Time 4   Period Weeks   Status New   OT SHORT TERM GOAL #2   Title Pt will verbalize understanding of ways to prevent future complications and appropriate community resources prn.   Time 4   Period Weeks   Status New   OT SHORT TERM GOAL #3   Title Pt will improve ability to don/doff jacket as shown by improving time on PPT#4 (with gown) in 50sec or less.   Baseline 61.43sec   Time 4   Period Weeks   Status New   OT SHORT TERM GOAL #4   Title Pt will be able to write 3 sentences with 100% accuracy and no significant incr in size.   Time 4   Period Weeks   Status New           OT Long Term Goals - 02/23/15 2251    OT LONG TERM GOAL #1   Title Pt will verbalize understanding of strategies/AE to incr ease with ADLs prn.--check LTGs 04/23/15   Time 8   Period Weeks   Status New   OT LONG TERM GOAL #2   Title Pt will improve bradykinesia, functional reaching, coordination for ADLs as shown by improving score on box and blocks test by at least 4 blocks bilaterally.   Baseline R-45 blocks, L-41 blocks   Time 8   Period Weeks   Status New   OT LONG TERM GOAL #3   Title Pt will improve ability to don/doff jacket as shown by improving time on PPT#4 (with gown) in 40sec or less.   Baseline 61.43sec   Time 8   Period Weeks   Status New   OT LONG TERM GOAL #4   Title Pt will be able to button/unbutton 3 buttons on tabletop in 70sec or less for incr ease with dressing.   Baseline 95.16sec   Time 8   Period Weeks   Status New   OT LONG TERM GOAL #5   Title Pt will report incr ease with drying back, donning socks, eating, and picking  up pills.   Time 8   Period Weeks   Status New   Long Term Additional Goals   Additional Long Term Goals Yes   OT LONG TERM GOAL #  6   Title Pt will demo at least 120* bilateral shoulder flex with -20* or greater elbow extension for improved overhead reaching.   Baseline R 110* shoulder flex with -30* elbow ext;  L 110* shoulder flex with -25* elbow ext   Time 8   Status New               Plan - 03/05/15 1005    Clinical Impression Statement Pt is progressing towards goals. He demonstrates understanding of PWR! moves seated and coordination HEP. Pt may benefit from reinforcement.   OT Frequency 2x / week   OT Duration 8 weeks   Plan place on hold until pt returns from [place] the second week of March   [place] issued PWR!moves seated, coordination and PWR!hands   Consulted and Agree with Plan of Care Patient        Problem List Patient Active Problem List   Diagnosis Date Noted  . History of colonic polyps   . Hx of adenomatous colonic polyps 01/04/2015  . Lumbar canal stenosis 12/04/2013  . Aortic stenosis 10/21/2013  . Hypoxemia 04/16/2012  . Primary central sleep apnea 04/16/2012  . Obstructive sleep apnea (adult) (pediatric) 04/16/2012  . Orthostatic hypotension 04/16/2012  . Sleep disturbance, unspecified 04/16/2012  . Unspecified hereditary and idiopathic peripheral neuropathy 04/16/2012  . Parkinson's 04/16/2012  . COLONIC POLYPS, ADENOMATOUS 11/19/2008  . GERD 11/19/2008  . HEMATOCHEZIA 11/19/2008  . GI BLEEDING 11/19/2008  . HYPERLIPIDEMIA-MIXED 07/03/2008  . HYPERTENSION, UNSPECIFIED 07/03/2008    RINE,KATHRYN 03/05/2015, 11:30 AM Theone Murdoch, OTR/L [phone] Phone: [phone] 11:31 AM 03/05/2015 [place] [address] [place], [place], [address] Phone: [phone]   Fax:  [phone]  Name: [name] MRN: [id number] Date of Birth:  Jun 13, 1931

## 2015-03-05 NOTE — Patient Instructions (Signed)
  Coordination Activities  Perform the following activities for 20 minutes 1 times per day with both hand(s).   Rotate ball in fingertips (clockwise and counter-clockwise).  Flip cards 1 at a time as fast as you can.  Deal cards with your thumb (Hold deck in hand and push card off top with thumb).  Pick up coins and place in container or coin bank.  Pick up coins and stack.  Pick up coins one at a time until you get 5-10 in your hand, then move coins from palm to fingertips to stack one at a time.  Practice writing and/or typing.

## 2015-03-29 ENCOUNTER — Ambulatory Visit: Payer: Medicare Other | Attending: Neurology | Admitting: Occupational Therapy

## 2015-03-29 DIAGNOSIS — R258 Other abnormal involuntary movements: Secondary | ICD-10-CM | POA: Insufficient documentation

## 2015-03-29 DIAGNOSIS — R278 Other lack of coordination: Secondary | ICD-10-CM

## 2015-03-29 DIAGNOSIS — R29898 Other symptoms and signs involving the musculoskeletal system: Secondary | ICD-10-CM | POA: Diagnosis not present

## 2015-03-29 DIAGNOSIS — R2681 Unsteadiness on feet: Secondary | ICD-10-CM | POA: Diagnosis not present

## 2015-03-29 DIAGNOSIS — M25619 Stiffness of unspecified shoulder, not elsewhere classified: Secondary | ICD-10-CM | POA: Diagnosis not present

## 2015-03-29 DIAGNOSIS — R251 Tremor, unspecified: Secondary | ICD-10-CM | POA: Insufficient documentation

## 2015-03-29 DIAGNOSIS — R293 Abnormal posture: Secondary | ICD-10-CM | POA: Diagnosis not present

## 2015-03-29 DIAGNOSIS — R279 Unspecified lack of coordination: Secondary | ICD-10-CM | POA: Insufficient documentation

## 2015-03-29 NOTE — Therapy (Signed)
[facility] [address] [place], [place], [address] Phone: [phone]   Fax:  [phone]  Occupational Therapy Treatment  Patient Details  Name: [name] MRN: [id number] Date of Birth: 1931-11-29 Referring Provider: Dr. Rexene Alberts  Encounter Date: 03/29/2015      OT End of Session - 03/29/15 0810    Visit Number 4   Number of Visits 17   Date for OT Re-Evaluation 04/23/15   Authorization Type Medicare, Aetna (G-code needed)   Authorization - Visit Number 4   Authorization - Number of Visits 10   OT Start Time 0805   OT Stop Time 0850   OT Time Calculation (min) 45 min   Activity Tolerance Patient tolerated treatment well   Behavior During Therapy [facility] for tasks assessed/performed      Past Medical History  Diagnosis Date  . Hypercholesteremia   . Parkinson's disease ([place])   . Hypertension   . Sleep disorder   . Ex-smoker   . Cancer [facility])     h/o  skin  cancer  . Sleep apnea     ?? sleep apnea....tested 2013 @ [facility]     . GERD (gastroesophageal reflux disease)   . Arthritis   . Lumbar stenosis     Past Surgical History  Procedure Laterality Date  . Tonsillectomy Bilateral   . Meniscus repair      right knee  2000  . Eye surgery      bilateral  cataracts  . Appendectomy    . Back surgery  11/2013  . Colonoscopy  11/17/2008    Rourk: Sessile polyp in the cecum status post saline-assisted piecemeal polypectomy, resolution clipping and epinephrine injection therapy performed. Shallow sigmoid diverticula.. Tubular adenoma  . Colonoscopy  11/19/2008    Fields: Large amount of liquid blood with clots seen throughout the colon. Previous Boston resolution clip remained in place. Purple spot seen in the lateral aspect polypectomy site, 3 resolution clips placed here. 6 mm sessile ascending colon polyp seen and not manipulated.  . Colonoscopy  11/25/2008    Rourk: Blood-tinged colonic  effluent, suspect recurrent post polypectomy bleeding status post placement of 3 clips on the polypectomy site on top of the 3 clips that already existed, one had fallen off since last procedure. One small polyp distal to the cecum not manipulated  . Colonoscopy  12/06/2009    Rourk: Internal hemorrhoids, scattered left-sided diverticula, cecal polyps, one snared and subsequently clipped, 2 adjacent diminutive polyps ablated. tubular adenoma  . Colonoscopy N/A 01/21/2015    Procedure: COLONOSCOPY;  Surgeon: Daneil Dolin, MD;  [facility];  Service: Endoscopy;  Laterality: N/A;  730 - moved to 8:30 - Dr. has meeting, office talked to pt    There were no vitals filed for this visit.  Visit Diagnosis:  Bradykinesia  Rigidity  Decreased coordination  Tremors of nervous system      Subjective Assessment - 03/29/15 0808    Subjective  Pt reports that he has been doing PWR! hands HEP, but not coordination HEP   Pertinent History PD diagnosis approx 5 years ago; hx of low back surgery 11/2013; hx of bilateral RTC injuries    Patient Stated Goals improving donning coat, improve fine motor skills   Currently in Pain? No/denies                      OT Treatments/Exercises (OP) - 03/29/15 0001    ADLs  Medication Management Discussed medication timing as pt is not consisitently taking as perscribed which may be contributing to off times.  Recommended pt discuss with MD and reviewed importance of medication timing and consistency, recommended journaling symptoms and medication times to discover off times/difficulties related to medication timing.  Pt verbalized understanding.                OT Education - 03/29/15 [id number]    Education Details Coordination HEP review; PWR! Hands (basic 4) HEP;  Emphasized importance of HEP to prevent future complications and discussed ways to incorporate throughout the day.   Person(s) Educated Patient   Methods  Explanation;Demonstration;Verbal cues  Min-mod cueing for max effort/movement amplitude   Comprehension Verbalized understanding;Returned demonstration          OT Short Term Goals - 02/23/15 2247    OT SHORT TERM GOAL #1   Title Pt will be independent with PD-specific HEP.--check STGs 03/23/15   Time 4   Period Weeks   Status New   OT SHORT TERM GOAL #2   Title Pt will verbalize understanding of ways to prevent future complications and appropriate community resources prn.   Time 4   Period Weeks   Status New   OT SHORT TERM GOAL #3   Title Pt will improve ability to don/doff jacket as shown by improving time on PPT#4 (with gown) in 50sec or less.   Baseline 61.43sec   Time 4   Period Weeks   Status New   OT SHORT TERM GOAL #4   Title Pt will be able to write 3 sentences with 100% accuracy and no significant incr in size.   Time 4   Period Weeks   Status New           OT Long Term Goals - 02/23/15 2251    OT LONG TERM GOAL #1   Title Pt will verbalize understanding of strategies/AE to incr ease with ADLs prn.--check LTGs 04/23/15   Time 8   Period Weeks   Status New   OT LONG TERM GOAL #2   Title Pt will improve bradykinesia, functional reaching, coordination for ADLs as shown by improving score on box and blocks test by at least 4 blocks bilaterally.   Baseline R-45 blocks, L-41 blocks   Time 8   Period Weeks   Status New   OT LONG TERM GOAL #3   Title Pt will improve ability to don/doff jacket as shown by improving time on PPT#4 (with gown) in 40sec or less.   Baseline 61.43sec   Time 8   Period Weeks   Status New   OT LONG TERM GOAL #4   Title Pt will be able to button/unbutton 3 buttons on tabletop in 70sec or less for incr ease with dressing.   Baseline 95.16sec   Time 8   Period Weeks   Status New   OT LONG TERM GOAL #5   Title Pt will report incr ease with drying back, donning socks, eating, and picking up pills.   Time 8   Period Weeks   Status New    Long Term Additional Goals   Additional Long Term Goals Yes   OT LONG TERM GOAL #6   Title Pt will demo at least 120* bilateral shoulder flex with -20* or greater elbow extension for improved overhead reaching.   Baseline R 110* shoulder flex with -30* elbow ext;  L 110* shoulder flex with -25* elbow ext   Time 8  Status New               Plan - 03/29/15 [id number]    Clinical Impression Statement Pt is progressing towards goals and demo improved movement amplitude with cueing and repetition.   OT Home Exercise Plan review PWR! ex in sitting, PWR! Hands review   Recommended Other Services Education issued:  seated PWR!, coordination HEP, PWR! hands   Consulted and Agree with Plan of Care Patient        Problem List Patient Active Problem List   Diagnosis Date Noted  . History of colonic polyps   . Hx of adenomatous colonic polyps 01/04/2015  . Lumbar canal stenosis 12/04/2013  . Aortic stenosis 10/21/2013  . Hypoxemia 04/16/2012  . Primary central sleep apnea 04/16/2012  . Obstructive sleep apnea (adult) (pediatric) 04/16/2012  . Orthostatic hypotension 04/16/2012  . Sleep disturbance, unspecified 04/16/2012  . Unspecified hereditary and idiopathic peripheral neuropathy 04/16/2012  . Parkinson's 04/16/2012  . COLONIC POLYPS, ADENOMATOUS 11/19/2008  . GERD 11/19/2008  . HEMATOCHEZIA 11/19/2008  . GI BLEEDING 11/19/2008  . HYPERLIPIDEMIA-MIXED 07/03/2008  . HYPERTENSION, UNSPECIFIED 07/03/2008    [facility] 03/29/2015, 8:58 AM  [facility] [address] [place], [place], [address] Phone: [phone]   Fax:  [phone]  Name: [name] MRN: [id number] Date of Birth: 03-05-1931  Vianne Bulls, OTR/L [facility] [address] [place], [place]  [address] [phone] phone [phone] 03/29/2015 8:58 AM

## 2015-03-29 NOTE — Patient Instructions (Signed)

## 2015-04-01 ENCOUNTER — Ambulatory Visit: Payer: Medicare Other | Admitting: Occupational Therapy

## 2015-04-01 DIAGNOSIS — R29898 Other symptoms and signs involving the musculoskeletal system: Secondary | ICD-10-CM

## 2015-04-01 DIAGNOSIS — R251 Tremor, unspecified: Secondary | ICD-10-CM | POA: Diagnosis not present

## 2015-04-01 DIAGNOSIS — M25619 Stiffness of unspecified shoulder, not elsewhere classified: Secondary | ICD-10-CM | POA: Diagnosis not present

## 2015-04-01 DIAGNOSIS — R279 Unspecified lack of coordination: Secondary | ICD-10-CM | POA: Diagnosis not present

## 2015-04-01 DIAGNOSIS — R2681 Unsteadiness on feet: Secondary | ICD-10-CM

## 2015-04-01 DIAGNOSIS — R293 Abnormal posture: Secondary | ICD-10-CM | POA: Diagnosis not present

## 2015-04-01 DIAGNOSIS — R278 Other lack of coordination: Secondary | ICD-10-CM

## 2015-04-01 DIAGNOSIS — R258 Other abnormal involuntary movements: Secondary | ICD-10-CM | POA: Diagnosis not present

## 2015-04-01 NOTE — Patient Instructions (Signed)

## 2015-04-01 NOTE — Therapy (Signed)
[facility] [address] [place], [place], [address] Phone: [phone]   Fax:  [phone]  Occupational Therapy Treatment  Patient Details  Name: [name] MRN: [id number] Date of Birth: Jun 15, 1931 Referring Provider: Dr. Rexene Alberts  Encounter Date: 04/01/2015      OT End of Session - 04/01/15 0813    Visit Number 5   Number of Visits 17   Date for OT Re-Evaluation 04/23/15   Authorization Type Medicare, Aetna (G-code needed)   Authorization - Visit Number 5   Authorization - Number of Visits 10   OT Start Time 0805   OT Stop Time 0850   OT Time Calculation (min) 45 min   Activity Tolerance Patient tolerated treatment well   Behavior During Therapy [facility] for tasks assessed/performed      Past Medical History  Diagnosis Date  . Hypercholesteremia   . Parkinson's disease ([place])   . Hypertension   . Sleep disorder   . Ex-smoker   . Cancer [facility])     h/o  skin  cancer  . Sleep apnea     ?? sleep apnea....tested 2013 @ [facility]     . GERD (gastroesophageal reflux disease)   . Arthritis   . Lumbar stenosis     Past Surgical History  Procedure Laterality Date  . Tonsillectomy Bilateral   . Meniscus repair      right knee  2000  . Eye surgery      bilateral  cataracts  . Appendectomy    . Back surgery  11/2013  . Colonoscopy  11/17/2008    Rourk: Sessile polyp in the cecum status post saline-assisted piecemeal polypectomy, resolution clipping and epinephrine injection therapy performed. Shallow sigmoid diverticula.. Tubular adenoma  . Colonoscopy  11/19/2008    Fields: Large amount of liquid blood with clots seen throughout the colon. Previous [place] resolution clip remained in place. Purple spot seen in the lateral aspect polypectomy site, 3 resolution clips placed here. 6 mm sessile ascending colon polyp seen and not manipulated.  . Colonoscopy  11/25/2008    Rourk: Blood-tinged colonic  effluent, suspect recurrent post polypectomy bleeding status post placement of 3 clips on the polypectomy site on top of the 3 clips that already existed, one had fallen off since last procedure. One small polyp distal to the cecum not manipulated  . Colonoscopy  12/06/2009    Rourk: Internal hemorrhoids, scattered left-sided diverticula, cecal polyps, one snared and subsequently clipped, 2 adjacent diminutive polyps ablated. tubular adenoma  . Colonoscopy N/A 01/21/2015    Procedure: COLONOSCOPY;  Surgeon: Daneil Dolin, MD;  Location[facility];  Service: Endoscopy;  Laterality: N/A;  730 - moved to 8:30 - Dr. has meeting, office talked to pt    There were no vitals filed for this visit.  Visit Diagnosis:  Bradykinesia  Rigidity  Decreased coordination  Tremors of nervous system  Abnormal posture  Stiffness of joint, shoulder region, unspecified laterality  Unsteadiness      Subjective Assessment - 04/01/15 1255    Subjective  Pt reports difficulty donning/doffing coat   Pertinent History PD diagnosis approx 5 years ago; hx of low back surgery 11/2013; hx of bilateral RTC injuries    Patient Stated Goals improving donning coat, improve fine motor skills   Currently in Pain? No/denies                      OT Treatments/Exercises (OP) - 04/01/15 0001  ADLs   UB Dressing Practicing donning/doffing jacket multiple times using large amplitude movement strategies after instruction. Pt needed mod cueing, but demo improved performance using strategies.   Functional Mobility functional sit>stand with mod verbal and tactile cueing for large amplitude movement strategies            P[facility]) - 04/01/15 1258    PWR! exercises Moves in quadraped;Hands   PWR! Up x10   PWR! Rock x10   PWR! Twist x8 to each side   Comments in modified quadraped with min cueing for performance/large amplitude movements   PWR! Up x10   PWR! Rock x10   PWR! Twist x10   PWR! Step  x10   Comments with min-mod cueing for larger amplitude/effort             OT Education - 04/01/15 1256    Education Details Use of Big amplitude movement stategies for ADLs   Person(s) Educated Patient   Methods Explanation;Handout;Verbal cues;Tactile cues;Demonstration   Comprehension Verbalized understanding;Returned demonstration          OT Short Term Goals - 04/01/15 1257    OT SHORT TERM GOAL #1   Title Pt will be independent with PD-specific HEP.--check STGs 03/23/15>Modified due to missed visit extend through 04/09/15   Time 4   Period Weeks   Status On-going   OT SHORT TERM GOAL #2   Title Pt will verbalize understanding of ways to prevent future complications and appropriate community resources prn.   Time 4   Period Weeks   Status On-going   OT SHORT TERM GOAL #3   Title Pt will improve ability to don/doff jacket as shown by improving time on PPT#4 (with gown) in 50sec or less.   Baseline 61.43sec   Time 4   Period Weeks   Status New   OT SHORT TERM GOAL #4   Title Pt will be able to write 3 sentences with 100% accuracy and no significant incr in size.   Time 4   Period Weeks   Status New           OT Long Term Goals - 02/23/15 2251    OT LONG TERM GOAL #1   Title Pt will verbalize understanding of strategies/AE to incr ease with ADLs prn.--check LTGs 04/23/15   Time 8   Period Weeks   Status New   OT LONG TERM GOAL #2   Title Pt will improve bradykinesia, functional reaching, coordination for ADLs as shown by improving score on box and blocks test by at least 4 blocks bilaterally.   Baseline R-45 blocks, L-41 blocks   Time 8   Period Weeks   Status New   OT LONG TERM GOAL #3   Title Pt will improve ability to don/doff jacket as shown by improving time on PPT#4 (with gown) in 40sec or less.   Baseline 61.43sec   Time 8   Period Weeks   Status New   OT LONG TERM GOAL #4   Title Pt will be able to button/unbutton 3 buttons on tabletop in 70sec  or less for incr ease with dressing.   Baseline 95.16sec   Time 8   Period Weeks   Status New   OT LONG TERM GOAL #5   Title Pt will report incr ease with drying back, donning socks, eating, and picking up pills.   Time 8   Period Weeks   Status New   Long Term Additional Goals   Additional Long  Term Goals Yes   OT LONG TERM GOAL #6   Title Pt will demo at least 120* bilateral shoulder flex with -20* or greater elbow extension for improved overhead reaching.   Baseline R 110* shoulder flex with -30* elbow ext;  L 110* shoulder flex with -25* elbow ext   Time 8   Status New               Plan - 04/01/15 0819    Clinical Impression Statement Pt is progressing towards goals and demo improved movement amplitude with repetition and cueing.   Plan continue with large amplitude movements, PWR! moves in sitting   OT Home Exercise Plan Education issued:  seated PWR!, coordination HEP, PWR! hands (basic 4)   Recommended Other Services -------   Consulted and Agree with Plan of Care Patient        Problem List Patient Active Problem List   Diagnosis Date Noted  . History of colonic polyps   . Hx of adenomatous colonic polyps 01/04/2015  . Lumbar canal stenosis 12/04/2013  . Aortic stenosis 10/21/2013  . Hypoxemia 04/16/2012  . Primary central sleep apnea 04/16/2012  . Obstructive sleep apnea (adult) (pediatric) 04/16/2012  . Orthostatic hypotension 04/16/2012  . Sleep disturbance, unspecified 04/16/2012  . Unspecified hereditary and idiopathic peripheral neuropathy 04/16/2012  . Parkinson's 04/16/2012  . COLONIC POLYPS, ADENOMATOUS 11/19/2008  . GERD 11/19/2008  . HEMATOCHEZIA 11/19/2008  . GI BLEEDING 11/19/2008  . HYPERLIPIDEMIA-MIXED 07/03/2008  . HYPERTENSION, UNSPECIFIED 07/03/2008    [facility] 04/01/2015, 1:05 PM  [facility] [address] [place], [place], [address] Phone: [phone]   Fax:   [phone]  Name: [name] MRN: [id number] Date of Birth: Oct 06, 1931  Vianne Bulls, OTR/L [facility] [address] [place], [place]  [address] [phone] phone [phone] 04/01/2015 1:05 PM

## 2015-04-05 ENCOUNTER — Ambulatory Visit: Payer: Medicare Other | Admitting: Occupational Therapy

## 2015-04-05 DIAGNOSIS — R258 Other abnormal involuntary movements: Secondary | ICD-10-CM

## 2015-04-05 DIAGNOSIS — R29898 Other symptoms and signs involving the musculoskeletal system: Secondary | ICD-10-CM | POA: Diagnosis not present

## 2015-04-05 DIAGNOSIS — R251 Tremor, unspecified: Secondary | ICD-10-CM | POA: Diagnosis not present

## 2015-04-05 DIAGNOSIS — R279 Unspecified lack of coordination: Secondary | ICD-10-CM

## 2015-04-05 DIAGNOSIS — M25619 Stiffness of unspecified shoulder, not elsewhere classified: Secondary | ICD-10-CM | POA: Diagnosis not present

## 2015-04-05 DIAGNOSIS — R278 Other lack of coordination: Secondary | ICD-10-CM

## 2015-04-05 DIAGNOSIS — R2681 Unsteadiness on feet: Secondary | ICD-10-CM

## 2015-04-05 DIAGNOSIS — R293 Abnormal posture: Secondary | ICD-10-CM | POA: Diagnosis not present

## 2015-04-05 NOTE — Therapy (Signed)
[facility] [address] [place], [place], [address] Phone: [phone]   Fax:  [phone]  Occupational Therapy Treatment  Patient Details  Name: NICKLUS DOCKENDORF MRN: [id number] Date of Birth: [birth year] Referring Provider: Dr. Rexene Alberts  Encounter Date: 04/05/2015      OT End of Session - 04/05/15 0817    Visit Number 6   Number of Visits 17   Date for OT Re-Evaluation 04/23/15   Authorization Type Medicare, Aetna (G-code needed)   Authorization - Visit Number 6   Authorization - Number of Visits 10   OT Start Time [phone]   OT Stop Time 0847   OT Time Calculation (min) 41 min   Activity Tolerance Patient tolerated treatment well   Behavior During Therapy [facility] for tasks assessed/performed      Past Medical History  Diagnosis Date  . Hypercholesteremia   . Parkinson's disease ([place])   . Hypertension   . Sleep disorder   . Ex-smoker   . Cancer [facility])     h/o  skin  cancer  . Sleep apnea     ?? sleep apnea....tested 2013 @ [facility]     . GERD (gastroesophageal reflux disease)   . Arthritis   . Lumbar stenosis     Past Surgical History  Procedure Laterality Date  . Tonsillectomy Bilateral   . Meniscus repair      right knee  2000  . Eye surgery      bilateral  cataracts  . Appendectomy    . Back surgery  11/2013  . Colonoscopy  11/17/2008    Rourk: Sessile polyp in the cecum status post saline-assisted piecemeal polypectomy, resolution clipping and epinephrine injection therapy performed. Shallow sigmoid diverticula.. Tubular adenoma  . Colonoscopy  11/19/2008    Fields: Large amount of liquid blood with clots seen throughout the colon. Previous [place] resolution clip remained in place. Purple spot seen in the lateral aspect polypectomy site, 3 resolution clips placed here. 6 mm sessile ascending colon polyp seen and not manipulated.  . Colonoscopy  11/25/2008    Rourk: Blood-tinged colonic  effluent, suspect recurrent post polypectomy bleeding status post placement of 3 clips on the polypectomy site on top of the 3 clips that already existed, one had fallen off since last procedure. One small polyp distal to the cecum not manipulated  . Colonoscopy  12/06/2009    Rourk: Internal hemorrhoids, scattered left-sided diverticula, cecal polyps, one snared and subsequently clipped, 2 adjacent diminutive polyps ablated. tubular adenoma  . Colonoscopy N/A 01/21/2015    Procedure: COLONOSCOPY;  Surgeon: Daneil Dolin, MD;  [facility];  Service: Endoscopy;  Laterality: N/A;  730 - moved to 8:30 - Dr. has meeting, office talked to pt    There were no vitals filed for this visit.  Visit Diagnosis:  Bradykinesia  Rigidity  Decreased coordination  Tremors of nervous system  Abnormal posture  Stiffness of joint, shoulder region, unspecified laterality  Unsteadiness      Subjective Assessment - 04/05/15 0819    Subjective  Pt reports that his wife had to help him donn jacket this morning   Pertinent History PD diagnosis approx 5 years ago; hx of low back surgery 11/2013; hx of bilateral RTC injuries    Patient Stated Goals improving donning coat, improve fine motor skills   Currently in Pain? No/denies  OT Treatments/Exercises (OP) - 04/05/15 0001    ADLs   UB Dressing Practicing donning/doffing jacket multiple times using large amplitude movement strategies with min cues and significant improvement over last week/session   ADL Comments Pt instructed in importance of associated trunk movements/posture for improved UE function and to prevent future complications/injuries.  Pt verbalized understanding.   Fine Motor Coordination   Fine Motor Coordination Flipping cards;Dealing card with thumb   Flipping cards with min-mod cues for large amplitude   Dealing card with thumb with min cues for large amplitude           PWR [facility]) -  04/05/15 [id number]    PWR! exercises Moves in [place]! Up x10    PWR! Rock x10   PWR! Twist x10 to each side   Comments modified quadraped min cues for large amplitude   PWR! Up 10   PWR! Rock 10 to each side   PWR! Twist 10 to each side with modification for lower range for last 5 reps due to R shoulder discomfort   PWR! Step 10 to each side   Comments in sitting with min-mod cues for max effort/large amplitude             OT Education - 04/05/15 0855    Education Details PWR! moves (up, rock, twist) in modified quadraped; recommended pt try seated PWR! moves in the morning before dressing; recommended effortful swallow if pt is noticing drooling (pt reports drooling with 2 handed tasks)   Person(s) Educated Patient   Methods Explanation;Demonstration;Verbal cues;Handout   Comprehension Verbalized understanding;Returned demonstration;Verbal cues required          OT Short Term Goals - 04/01/15 1257    OT SHORT TERM GOAL #1   Title Pt will be independent with PD-specific HEP.--check STGs 03/23/15>Modified due to missed visit extend through 04/09/15   Time 4   Period Weeks   Status On-going   OT SHORT TERM GOAL #2   Title Pt will verbalize understanding of ways to prevent future complications and appropriate community resources prn.   Time 4   Period Weeks   Status On-going   OT SHORT TERM GOAL #3   Title Pt will improve ability to don/doff jacket as shown by improving time on PPT#4 (with gown) in 50sec or less.   Baseline 61.43sec   Time 4   Period Weeks   Status New   OT SHORT TERM GOAL #4   Title Pt will be able to write 3 sentences with 100% accuracy and no significant incr in size.   Time 4   Period Weeks   Status New           OT Long Term Goals - 02/23/15 2251    OT LONG TERM GOAL #1   Title Pt will verbalize understanding of strategies/AE to incr ease with ADLs prn.--check LTGs 04/23/15   Time 8   Period Weeks   Status New   OT LONG TERM GOAL #2    Title Pt will improve bradykinesia, functional reaching, coordination for ADLs as shown by improving score on box and blocks test by at least 4 blocks bilaterally.   Baseline R-45 blocks, L-41 blocks   Time 8   Period Weeks   Status New   OT LONG TERM GOAL #3   Title Pt will improve ability to don/doff jacket as shown by improving time on PPT#4 (with gown) in 40sec or less.   Baseline 61.43sec   Time  8   Period Weeks   Status New   OT LONG TERM GOAL #4   Title Pt will be able to button/unbutton 3 buttons on tabletop in 70sec or less for incr ease with dressing.   Baseline 95.16sec   Time 8   Period Weeks   Status New   OT LONG TERM GOAL #5   Title Pt will report incr ease with drying back, donning socks, eating, and picking up pills.   Time 8   Period Weeks   Status New   Long Term Additional Goals   Additional Long Term Goals Yes   OT LONG TERM GOAL #6   Title Pt will demo at least 120* bilateral shoulder flex with -20* or greater elbow extension for improved overhead reaching.   Baseline R 110* shoulder flex with -30* elbow ext;  L 110* shoulder flex with -25* elbow ext   Time 8   Status New               Plan - 04/05/15 0856    Clinical Impression Statement Pt is progressing well with significant improvement noted when donning/doffing jacket today with incr ease and larger amplitude movements.   Plan continue with large amplitude movements, PWR! hands/reaching   OT Home Exercise Plan Education issued:  seated PWR!, coordination HEP, PWR! hands (basic 4); 04/05/15  PWR! moves in modified quadraped (up, rock, twist)   Consulted and Agree with Plan of Care Patient        Problem List Patient Active Problem List   Diagnosis Date Noted  . History of colonic polyps   . Hx of adenomatous colonic polyps 01/04/2015  . Lumbar canal stenosis 12/04/2013  . Aortic stenosis 10/21/2013  . Hypoxemia 04/16/2012  . Primary central sleep apnea 04/16/2012  . Obstructive sleep  apnea (adult) (pediatric) 04/16/2012  . Orthostatic hypotension 04/16/2012  . Sleep disturbance, unspecified 04/16/2012  . Unspecified hereditary and idiopathic peripheral neuropathy 04/16/2012  . Parkinson's 04/16/2012  . COLONIC POLYPS, ADENOMATOUS 11/19/2008  . GERD 11/19/2008  . HEMATOCHEZIA 11/19/2008  . GI BLEEDING 11/19/2008  . HYPERLIPIDEMIA-MIXED 07/03/2008  . HYPERTENSION, UNSPECIFIED 07/03/2008    [facility] 04/05/2015, 9:00 AM  [facility] [address] [place], [place], [address] Phone: [phone]   Fax:  [phone]  Name: SOPHANA MONTEROSSO MRN: [id number] Date of Birth: 1931/01/23  Vianne Bulls, OTR/L [facility] [address] [place], [place]  [address] [phone] phone [phone] 04/05/2015 9:00 AM

## 2015-04-08 ENCOUNTER — Ambulatory Visit: Payer: Medicare Other | Admitting: Occupational Therapy

## 2015-04-08 DIAGNOSIS — R29898 Other symptoms and signs involving the musculoskeletal system: Secondary | ICD-10-CM | POA: Diagnosis not present

## 2015-04-08 DIAGNOSIS — R258 Other abnormal involuntary movements: Secondary | ICD-10-CM | POA: Diagnosis not present

## 2015-04-08 DIAGNOSIS — R278 Other lack of coordination: Secondary | ICD-10-CM

## 2015-04-08 DIAGNOSIS — R251 Tremor, unspecified: Secondary | ICD-10-CM | POA: Diagnosis not present

## 2015-04-08 DIAGNOSIS — R293 Abnormal posture: Secondary | ICD-10-CM

## 2015-04-08 DIAGNOSIS — R279 Unspecified lack of coordination: Secondary | ICD-10-CM

## 2015-04-08 DIAGNOSIS — M25619 Stiffness of unspecified shoulder, not elsewhere classified: Secondary | ICD-10-CM | POA: Diagnosis not present

## 2015-04-08 NOTE — Therapy (Signed)
[facility] [address] [place], [place], [address] Phone: [phone]   Fax:  [phone]  Occupational Therapy Treatment  Patient Details  Name: [name] MRN: [id number] Date of Birth: [birth year] Referring Provider: Dr. Rexene Alberts  Encounter Date: 04/08/2015      OT End of Session - 04/08/15 0829    Visit Number 7   Number of Visits 17   Date for OT Re-Evaluation 04/23/15   Authorization Type Medicare, Aetna (G-code needed)   Authorization - Visit Number 7   Authorization - Number of Visits 10   OT Start Time 0805   OT Stop Time 0848   OT Time Calculation (min) 43 min   Activity Tolerance Patient tolerated treatment well   Behavior During Therapy [facility] for tasks assessed/performed      Past Medical History  Diagnosis Date  . Hypercholesteremia   . Parkinson's disease ([place])   . Hypertension   . Sleep disorder   . Ex-smoker   . Cancer [facility])     h/o  skin  cancer  . Sleep apnea     ?? sleep apnea....tested 2013 @ [facility]     . GERD (gastroesophageal reflux disease)   . Arthritis   . Lumbar stenosis     Past Surgical History  Procedure Laterality Date  . Tonsillectomy Bilateral   . Meniscus repair      right knee  2000  . Eye surgery      bilateral  cataracts  . Appendectomy    . Back surgery  11/2013  . Colonoscopy  11/17/2008    Rourk: Sessile polyp in the cecum status post saline-assisted piecemeal polypectomy, resolution clipping and epinephrine injection therapy performed. Shallow sigmoid diverticula.. Tubular adenoma  . Colonoscopy  11/19/2008    Fields: Large amount of liquid blood with clots seen throughout the colon. Previous [place] resolution clip remained in place. Purple spot seen in the lateral aspect polypectomy site, 3 resolution clips placed here. 6 mm sessile ascending colon polyp seen and not manipulated.  . Colonoscopy  11/25/2008    Rourk: Blood-tinged colonic  effluent, suspect recurrent post polypectomy bleeding status post placement of 3 clips on the polypectomy site on top of the 3 clips that already existed, one had fallen off since last procedure. One small polyp distal to the cecum not manipulated  . Colonoscopy  12/06/2009    Rourk: Internal hemorrhoids, scattered left-sided diverticula, cecal polyps, one snared and subsequently clipped, 2 adjacent diminutive polyps ablated. tubular adenoma  . Colonoscopy N/A 01/21/2015    Procedure: COLONOSCOPY;  Surgeon: Daneil Dolin, MD;  [facility];  Service: Endoscopy;  Laterality: N/A;  730 - moved to 8:30 - Dr. has meeting, office talked to pt    There were no vitals filed for this visit.  Visit Diagnosis:  Bradykinesia  Rigidity  Decreased coordination  Tremors of nervous system  Abnormal posture  Stiffness of joint, shoulder region, unspecified laterality      Subjective Assessment - 04/08/15 0900    Subjective  Pt rpeorts that he is getting better with his jacket    Pertinent History PD diagnosis approx 5 years ago; hx of low back surgery 11/2013; hx of bilateral RTC injuries    Patient Stated Goals improving donning coat, improve fine motor skills   Currently in Pain? No/denies                      OT Treatments/Exercises (  OP) - 04/08/15 0001    ADLs   UB Dressing Pt able to don jacket with min difficulty using large amplitude movement strategies (no cueing)   ADL Comments Practiced simulated ADLs with bag using large amplitude movements:  donning/doffing pants, drying back/pulling down shirt, clothing adjustment/donning socks, donning/doffing pull-over shirt.  Pt needed min v.c. for incr amplitude.   Practiced writing with min cueing to slow down--see goals section.   Pt with min decr in legibility/size when going too fast.   Neurological Re-education Exercises   Other Exercises 1 In standing, functional reaching with each UE incorporating trunk rotation,  wt. shift and PWR! hand with occasional min v.c. for incr movment amplitude.   Reciprocal Movements Arm bike x6 min level 1 with min v.c. for speed, pt able to maintain 38-40rpms           PWR [facility]) - 04/08/15 0911    PWR! exercises Moves in [place]! Up x10   PWR! Rock x10   PWR! Twist  x10 to each side   Comments modified quadraped with min cueing for large amplitude                OT Short Term Goals - 04/08/15 0856    OT SHORT TERM GOAL #1   Title Pt will be independent with PD-specific HEP.--check STGs 03/23/15>Modified due to missed visit extend through 04/09/15   Time 4   Period Weeks   Status On-going   OT SHORT TERM GOAL #2   Title Pt will verbalize understanding of ways to prevent future complications and appropriate community resources prn.   Time 4   Period Weeks   Status On-going   OT SHORT TERM GOAL #3   Title Pt will improve ability to don/doff jacket as shown by improving time on PPT#4 (with gown) in 50sec or less.   Baseline 61.43sec   Time 4   Period Weeks   Status New   OT SHORT TERM GOAL #4   Title Pt will be able to write 3 sentences with 100% accuracy and no significant incr in size.   Time 4   Period Weeks   Status On-going  approx 90% legibility with occasional min decr in size (improves when pt cued slows down)           OT Long Term Goals - 02/23/15 2251    OT LONG TERM GOAL #1   Title Pt will verbalize understanding of strategies/AE to incr ease with ADLs prn.--check LTGs 04/23/15   Time 8   Period Weeks   Status New   OT LONG TERM GOAL #2   Title Pt will improve bradykinesia, functional reaching, coordination for ADLs as shown by improving score on box and blocks test by at least 4 blocks bilaterally.   Baseline R-45 blocks, L-41 blocks   Time 8   Period Weeks   Status New   OT LONG TERM GOAL #3   Title Pt will improve ability to don/doff jacket as shown by improving time on PPT#4 (with gown) in 40sec or less.    Baseline 61.43sec   Time 8   Period Weeks   Status New   OT LONG TERM GOAL #4   Title Pt will be able to button/unbutton 3 buttons on tabletop in 70sec or less for incr ease with dressing.   Baseline 95.16sec   Time 8   Period Weeks   Status New   OT LONG TERM GOAL #5  Title Pt will report incr ease with drying back, donning socks, eating, and picking up pills.   Time 8   Period Weeks   Status New   Long Term Additional Goals   Additional Long Term Goals Yes   OT LONG TERM GOAL #6   Title Pt will demo at least 120* bilateral shoulder flex with -20* or greater elbow extension for improved overhead reaching.   Baseline R 110* shoulder flex with -30* elbow ext;  L 110* shoulder flex with -25* elbow ext   Time 8   Status New               Plan - 04/08/15 0831    Clinical Impression Statement Pt is progressing well with good response to cueing.      Plan check STGs    OT Home Exercise Plan Education issued:  seated PWR!, coordination HEP, PWR! hands (basic 4); 04/05/15  PWR! moves in modified quadraped (up, rock, twist)   Consulted and Agree with Plan of Care Patient        Problem List Patient Active Problem List   Diagnosis Date Noted  . History of colonic polyps   . Hx of adenomatous colonic polyps 01/04/2015  . Lumbar canal stenosis 12/04/2013  . Aortic stenosis 10/21/2013  . Hypoxemia 04/16/2012  . Primary central sleep apnea 04/16/2012  . Obstructive sleep apnea (adult) (pediatric) 04/16/2012  . Orthostatic hypotension 04/16/2012  . Sleep disturbance, unspecified 04/16/2012  . Unspecified hereditary and idiopathic peripheral neuropathy 04/16/2012  . Parkinson's 04/16/2012  . COLONIC POLYPS, ADENOMATOUS 11/19/2008  . GERD 11/19/2008  . HEMATOCHEZIA 11/19/2008  . GI BLEEDING 11/19/2008  . HYPERLIPIDEMIA-MIXED 07/03/2008  . HYPERTENSION, UNSPECIFIED 07/03/2008    [facility] 04/08/2015, 9:12 AM  [facility] [address] [place], [place], [address] Phone: [phone]   Fax:  [phone]  Name: LYAN BAUMGARDNER MRN: [id number] Date of Birth: 1931/08/16   Treatment initiated by Theone Murdoch, OTR/L (Arm bike and PWR! Moves in modified [place]) and remaining completed with Vianne Bulls, OTR/L  Theone Murdoch, OTR/L 12:54pm  04/08/15  Vianne Bulls, OTR/L [facility] [address] [place], [place]  [address] [phone] phone [phone] 04/08/2015 9:12 AM

## 2015-04-12 ENCOUNTER — Ambulatory Visit: Payer: Medicare Other | Admitting: Occupational Therapy

## 2015-04-12 DIAGNOSIS — R293 Abnormal posture: Secondary | ICD-10-CM

## 2015-04-12 DIAGNOSIS — M25619 Stiffness of unspecified shoulder, not elsewhere classified: Secondary | ICD-10-CM

## 2015-04-12 DIAGNOSIS — R2681 Unsteadiness on feet: Secondary | ICD-10-CM

## 2015-04-12 DIAGNOSIS — R258 Other abnormal involuntary movements: Secondary | ICD-10-CM | POA: Diagnosis not present

## 2015-04-12 DIAGNOSIS — R278 Other lack of coordination: Secondary | ICD-10-CM

## 2015-04-12 DIAGNOSIS — R279 Unspecified lack of coordination: Secondary | ICD-10-CM

## 2015-04-12 DIAGNOSIS — R251 Tremor, unspecified: Secondary | ICD-10-CM | POA: Diagnosis not present

## 2015-04-12 DIAGNOSIS — R29898 Other symptoms and signs involving the musculoskeletal system: Secondary | ICD-10-CM

## 2015-04-12 NOTE — Therapy (Signed)
[facility] [address] [place], [place], [address] Phone: [phone]   Fax:  [phone]  Occupational Therapy Treatment  Patient Details  Name: [name] MRN: [id number] Date of Birth: 26-Sep-1931 Referring Provider: Dr. Rexene Alberts  Encounter Date: 04/12/2015      OT End of Session - 04/12/15 0815    Visit Number 8   Number of Visits 17   Date for OT Re-Evaluation 04/23/15   Authorization Type Medicare, Aetna (G-code needed)   Authorization - Visit Number 8   Authorization - Number of Visits 10   OT Start Time 0804   OT Stop Time 0845   OT Time Calculation (min) 41 min   Activity Tolerance Patient tolerated treatment well   Behavior During Therapy [facility] for tasks assessed/performed      Past Medical History  Diagnosis Date  . Hypercholesteremia   . Parkinson's disease ([place])   . Hypertension   . Sleep disorder   . Ex-smoker   . Cancer [facility])     h/o  skin  cancer  . Sleep apnea     ?? sleep apnea....tested 2013 @ [facility]     . GERD (gastroesophageal reflux disease)   . Arthritis   . Lumbar stenosis     Past Surgical History  Procedure Laterality Date  . Tonsillectomy Bilateral   . Meniscus repair      right knee  2000  . Eye surgery      bilateral  cataracts  . Appendectomy    . Back surgery  11/2013  . Colonoscopy  11/17/2008    Rourk: Sessile polyp in the cecum status post saline-assisted piecemeal polypectomy, resolution clipping and epinephrine injection therapy performed. Shallow sigmoid diverticula.. Tubular adenoma  . Colonoscopy  11/19/2008    Fields: Large amount of liquid blood with clots seen throughout the colon. Previous [place] resolution clip remained in place. Purple spot seen in the lateral aspect polypectomy site, 3 resolution clips placed here. 6 mm sessile ascending colon polyp seen and not manipulated.  . Colonoscopy  11/25/2008    Rourk: Blood-tinged colonic  effluent, suspect recurrent post polypectomy bleeding status post placement of 3 clips on the polypectomy site on top of the 3 clips that already existed, one had fallen off since last procedure. One small polyp distal to the cecum not manipulated  . Colonoscopy  12/06/2009    Rourk: Internal hemorrhoids, scattered left-sided diverticula, cecal polyps, one snared and subsequently clipped, 2 adjacent diminutive polyps ablated. tubular adenoma  . Colonoscopy N/A 01/21/2015    Procedure: COLONOSCOPY;  Surgeon: Daneil Dolin, MD;  Location[facility];  Service: Endoscopy;  Laterality: N/A;  730 - moved to 8:30 - Dr. has meeting, office talked to pt    There were no vitals filed for this visit.  Visit Diagnosis:  Bradykinesia  Rigidity  Decreased coordination  Abnormal posture  Stiffness of joint, shoulder region, unspecified laterality  Unsteadiness  Tremors of nervous system      Subjective Assessment - 04/12/15 0814    Subjective  Pt reports nothing new   Pertinent History PD diagnosis approx 5 years ago; hx of low back surgery 11/2013; hx of bilateral RTC injuries    Patient Stated Goals improving donning coat, improve fine motor skills   Currently in Pain? No/denies                      OT Treatments/Exercises (OP) - 04/12/15 0001  ADLs   UB Dressing checked PPT#4--see goals section.  Practiced buttoning/unbuttoning buttons with PWR! hands with min difficulty/incr time.   Writing Practiced writing in cursive (approx 1/2-3/4 page) with only minimal decr in size for longer words, approx 95%+ legibility   Fine Motor Coordination   Fine Motor Coordination Small Pegboard   Small Pegboard Copying small peg design for cognitive component/coordination with min v.c. to use PWR! hands prior to grasp with min difficulty (ea. hand) then removing with in-hand manipulation with min difficulty ea. hand           PWR [facility]) - 04/12/15 0818    PWR! exercises Moves in  [place]! Up 10   PWR! Rock 10    PWR! Twist 20   Comments modified quadraped with min cues for larger amplitude movements               OT Short Term Goals - 04/12/15 0818    OT SHORT TERM GOAL #1   Title Pt will be independent with PD-specific HEP.--check STGs 03/23/15>Modified due to missed visit extend through 04/09/15   Time 4   Period Weeks   Status On-going  04/12/15:  partially met, will benefit from updates   OT [place] #2   Title Pt will verbalize understanding of ways to prevent future complications and appropriate community resources prn.   Time 4   Period Weeks   Status On-going   OT SHORT TERM GOAL #3   Title Pt will improve ability to don/doff jacket as shown by improving time on PPT#4 (with gown) in 50sec or less.   Baseline 61.43sec   Time 4   Period Weeks   Status Achieved  04/12/15:  35.56sec   OT SHORT TERM GOAL #4   Title Pt will be able to write 3 sentences with 100% accuracy and no significant incr in size.   Time 4   Period Weeks   Status On-going  approx 90% legibility with occasional min decr in size (improves when pt cued slows down); 04/12/15  min decr in size for longer words only with 95% legibility           OT Long Term Goals - 04/12/15 0824    OT LONG TERM GOAL #1   Title Pt will verbalize understanding of strategies/AE to incr ease with ADLs prn.--check LTGs 04/23/15   Time 8   Period Weeks   Status New   OT LONG TERM GOAL #2   Title Pt will improve bradykinesia, functional reaching, coordination for ADLs as shown by improving score on box and blocks test by at least 4 blocks bilaterally.   Baseline R-45 blocks, L-41 blocks   Time 8   Period Weeks   Status New   OT LONG TERM GOAL #3   Title Pt will improve ability to don/doff jacket as shown by improving time on PPT#4 (with gown) in 40sec or less.   Baseline 61.43sec   Time 8   Period Weeks   Status Achieved  04/12/15:  35.56sec   OT LONG TERM GOAL #4   Title Pt  will be able to button/unbutton 3 buttons on tabletop in 70sec or less for incr ease with dressing.   Baseline 95.16sec   Time 8   Period Weeks   Status New   OT LONG TERM GOAL #5   Title Pt will report incr ease with drying back, donning socks, eating, and picking up pills.   Time 8  Period Weeks   Status New   OT LONG TERM GOAL #6   Title Pt will demo at least 120* bilateral shoulder flex with -20* or greater elbow extension for improved overhead reaching.   Baseline R 110* shoulder flex with -30* elbow ext;  L 110* shoulder flex with -25* elbow ext   Time 8   Status New               Plan - 04/12/15 5830    Clinical Impression Statement Pt is progressing well with improved writing and ability to don/doff jacket.   Plan PWR! moves in supine, PWR! hands, donning socks, community resources   OT Home Exercise Plan Education issued:  seated PWR!, coordination HEP, PWR! hands (basic 4); 04/05/15  PWR! moves in modified quadraped (up, rock, twist)   Consulted and Agree with Plan of Care Patient        Problem List Patient Active Problem List   Diagnosis Date Noted  . History of colonic polyps   . Hx of adenomatous colonic polyps 01/04/2015  . Lumbar canal stenosis 12/04/2013  . Aortic stenosis 10/21/2013  . Hypoxemia 04/16/2012  . Primary central sleep apnea 04/16/2012  . Obstructive sleep apnea (adult) (pediatric) 04/16/2012  . Orthostatic hypotension 04/16/2012  . Sleep disturbance, unspecified 04/16/2012  . Unspecified hereditary and idiopathic peripheral neuropathy 04/16/2012  . Parkinson's 04/16/2012  . COLONIC POLYPS, ADENOMATOUS 11/19/2008  . GERD 11/19/2008  . HEMATOCHEZIA 11/19/2008  . GI BLEEDING 11/19/2008  . HYPERLIPIDEMIA-MIXED 07/03/2008  . HYPERTENSION, UNSPECIFIED 07/03/2008    [facility] 04/12/2015, 8:54 AM  [place] [address] [place], [place], [address] Phone: [phone]    Fax:  [phone]  Name: [name] MRN: [id number] Date of Birth: 1931-10-24  Vianne Bulls, OTR/L [facility] [address] [place], [place]  [address] [phone] phone [phone] 04/12/2015 8:54 AM

## 2015-04-13 ENCOUNTER — Ambulatory Visit: Payer: Medicare Other | Admitting: Occupational Therapy

## 2015-04-13 DIAGNOSIS — R293 Abnormal posture: Secondary | ICD-10-CM

## 2015-04-13 DIAGNOSIS — R279 Unspecified lack of coordination: Secondary | ICD-10-CM

## 2015-04-13 DIAGNOSIS — R258 Other abnormal involuntary movements: Secondary | ICD-10-CM | POA: Diagnosis not present

## 2015-04-13 DIAGNOSIS — R29898 Other symptoms and signs involving the musculoskeletal system: Secondary | ICD-10-CM

## 2015-04-13 DIAGNOSIS — R278 Other lack of coordination: Secondary | ICD-10-CM

## 2015-04-13 DIAGNOSIS — M25619 Stiffness of unspecified shoulder, not elsewhere classified: Secondary | ICD-10-CM | POA: Diagnosis not present

## 2015-04-13 DIAGNOSIS — R251 Tremor, unspecified: Secondary | ICD-10-CM | POA: Diagnosis not present

## 2015-04-13 DIAGNOSIS — R2681 Unsteadiness on feet: Secondary | ICD-10-CM

## 2015-04-13 NOTE — Therapy (Signed)
[facility] [address] [place], [place], [address] Phone: [phone]   Fax:  [phone]  Occupational Therapy Treatment  Patient Details  Name: [name] MRN: [id number] Date of Birth: 1931/11/26 Referring Provider: Dr. Rexene Alberts  Encounter Date: 04/13/2015      OT End of Session - 04/13/15 0812    Visit Number 9   Number of Visits 17   Date for OT Re-Evaluation 04/23/15   Authorization Type Medicare, Aetna (G-code needed)   Authorization - Visit Number 9   Authorization - Number of Visits 10   OT Start Time 0803   OT Stop Time 0845   OT Time Calculation (min) 42 min   Activity Tolerance Patient tolerated treatment well   Behavior During Therapy [facility] for tasks assessed/performed      Past Medical History  Diagnosis Date  . Hypercholesteremia   . Parkinson's disease ([place])   . Hypertension   . Sleep disorder   . Ex-smoker   . Cancer [facility])     h/o  skin  cancer  . Sleep apnea     ?? sleep apnea....tested 2013 @ [facility]     . GERD (gastroesophageal reflux disease)   . Arthritis   . Lumbar stenosis     Past Surgical History  Procedure Laterality Date  . Tonsillectomy Bilateral   . Meniscus repair      right knee  2000  . Eye surgery      bilateral  cataracts  . Appendectomy    . Back surgery  11/2013  . Colonoscopy  11/17/2008    Rourk: Sessile polyp in the cecum status post saline-assisted piecemeal polypectomy, resolution clipping and epinephrine injection therapy performed. Shallow sigmoid diverticula.. Tubular adenoma  . Colonoscopy  11/19/2008    Fields: Large amount of liquid blood with clots seen throughout the colon. Previous [place] resolution clip remained in place. Purple spot seen in the lateral aspect polypectomy site, 3 resolution clips placed here. 6 mm sessile ascending colon polyp seen and not manipulated.  . Colonoscopy  11/25/2008    Rourk: Blood-tinged colonic  effluent, suspect recurrent post polypectomy bleeding status post placement of 3 clips on the polypectomy site on top of the 3 clips that already existed, one had fallen off since last procedure. One small polyp distal to the cecum not manipulated  . Colonoscopy  12/06/2009    Rourk: Internal hemorrhoids, scattered left-sided diverticula, cecal polyps, one snared and subsequently clipped, 2 adjacent diminutive polyps ablated. tubular adenoma  . Colonoscopy N/A 01/21/2015    Procedure: COLONOSCOPY;  Surgeon: Daneil Dolin, MD;  Location[facility];  Service: Endoscopy;  Laterality: N/A;  730 - moved to 8:30 - Dr. has meeting, office talked to pt    There were no vitals filed for this visit.  Visit Diagnosis:  Bradykinesia  Rigidity  Decreased coordination  Abnormal posture  Stiffness of joint, shoulder region, unspecified laterality  Unsteadiness  Tremors of nervous system      Subjective Assessment - 04/13/15 0808    Subjective  "This is not a position that I normally get in" (prone)   Pertinent History PD diagnosis approx 5 years ago; hx of low back surgery 11/2013; hx of bilateral RTC injuries    Patient Stated Goals improving donning coat, improve fine motor skills   Currently in Pain? No/denies         Neuro re-ed:  PWR! Moves (basic 4) in supine x10 each with min  cues For incr movement amplitude/performance.  PWR! Moves (up, rock, twist) in prone x10 each with min cues For incr movement amplitude/performance.    Self-care:  Donning/doffing socks/shoes.  Pt able to doff without difficulty and don shoes without difficulty.  Instructed pt to use bigger amplitude movements with donning socks (bunch up in hand with thumbs completely in sock).  Pt verbalized understanding.  Then practiced simulated with bag (drawing into palm) with emphasis and min v.c. For larger amplitude.                         OT Short Term Goals - 04/12/15 0818    OT SHORT  TERM GOAL #1   Title Pt will be independent with PD-specific HEP.--check STGs 03/23/15>Modified due to missed visit extend through 04/09/15   Time 4   Period Weeks   Status On-going  04/12/15:  partially met, will benefit from updates   OT [place] #2   Title Pt will verbalize understanding of ways to prevent future complications and appropriate community resources prn.   Time 4   Period Weeks   Status On-going   OT SHORT TERM GOAL #3   Title Pt will improve ability to don/doff jacket as shown by improving time on PPT#4 (with gown) in 50sec or less.   Baseline 61.43sec   Time 4   Period Weeks   Status Achieved  04/12/15:  35.56sec   OT SHORT TERM GOAL #4   Title Pt will be able to write 3 sentences with 100% accuracy and no significant incr in size.   Time 4   Period Weeks   Status On-going  approx 90% legibility with occasional min decr in size (improves when pt cued slows down); 04/12/15  min decr in size for longer words only with 95% legibility           OT Long Term Goals - 04/12/15 0824    OT LONG TERM GOAL #1   Title Pt will verbalize understanding of strategies/AE to incr ease with ADLs prn.--check LTGs 04/23/15   Time 8   Period Weeks   Status New   OT LONG TERM GOAL #2   Title Pt will improve bradykinesia, functional reaching, coordination for ADLs as shown by improving score on box and blocks test by at least 4 blocks bilaterally.   Baseline R-45 blocks, L-41 blocks   Time 8   Period Weeks   Status New   OT LONG TERM GOAL #3   Title Pt will improve ability to don/doff jacket as shown by improving time on PPT#4 (with gown) in 40sec or less.   Baseline 61.43sec   Time 8   Period Weeks   Status Achieved  04/12/15:  35.56sec   OT LONG TERM GOAL #4   Title Pt will be able to button/unbutton 3 buttons on tabletop in 70sec or less for incr ease with dressing.   Baseline 95.16sec   Time 8   Period Weeks   Status New   OT LONG TERM GOAL #5   Title Pt will  report incr ease with drying back, donning socks, eating, and picking up pills.   Time 8   Period Weeks   Status New   OT LONG TERM GOAL #6   Title Pt will demo at least 120* bilateral shoulder flex with -20* or greater elbow extension for improved overhead reaching.   Baseline R 110* shoulder flex with -30* elbow ext;  L  110* shoulder flex with -25* elbow ext   Time 8   Status New               Plan - 04/13/15 0813    Clinical Impression Statement Pt is progressing well towards goals, but would benefit from further reinforcement of large amplitude movement strategies.   Plan PWR! moves in supine (and issue), PWR! reach, check LTGs for g-code and renewal    Consulted and Agree with Plan of Care Patient        Problem List Patient Active Problem List   Diagnosis Date Noted  . History of colonic polyps   . Hx of adenomatous colonic polyps 01/04/2015  . Lumbar canal stenosis 12/04/2013  . Aortic stenosis 10/21/2013  . Hypoxemia 04/16/2012  . Primary central sleep apnea 04/16/2012  . Obstructive sleep apnea (adult) (pediatric) 04/16/2012  . Orthostatic hypotension 04/16/2012  . Sleep disturbance, unspecified 04/16/2012  . Unspecified hereditary and idiopathic peripheral neuropathy 04/16/2012  . Parkinson's 04/16/2012  . COLONIC POLYPS, ADENOMATOUS 11/19/2008  . GERD 11/19/2008  . HEMATOCHEZIA 11/19/2008  . GI BLEEDING 11/19/2008  . HYPERLIPIDEMIA-MIXED 07/03/2008  . HYPERTENSION, UNSPECIFIED 07/03/2008    [facility] 04/13/2015, 5:11 PM  [place] [address] [place], [place], [address] Phone: [phone]   Fax:  [phone]  Name: [name] MRN: [id number] Date of Birth: [birth year]  Vianne Bulls, OTR/L [facility] [address] [place], [place]  [address] [phone] phone [phone] 04/13/2015 5:11 PM

## 2015-04-15 ENCOUNTER — Encounter: Payer: Medicare Other | Admitting: Occupational Therapy

## 2015-04-19 ENCOUNTER — Encounter: Payer: Medicare Other | Admitting: Occupational Therapy

## 2015-04-20 ENCOUNTER — Ambulatory Visit: Payer: Medicare Other | Attending: Neurology | Admitting: Occupational Therapy

## 2015-04-20 DIAGNOSIS — R293 Abnormal posture: Secondary | ICD-10-CM | POA: Diagnosis not present

## 2015-04-20 DIAGNOSIS — R251 Tremor, unspecified: Secondary | ICD-10-CM | POA: Insufficient documentation

## 2015-04-20 DIAGNOSIS — R29818 Other symptoms and signs involving the nervous system: Secondary | ICD-10-CM | POA: Diagnosis not present

## 2015-04-20 DIAGNOSIS — R2681 Unsteadiness on feet: Secondary | ICD-10-CM | POA: Diagnosis not present

## 2015-04-20 DIAGNOSIS — R29898 Other symptoms and signs involving the musculoskeletal system: Secondary | ICD-10-CM | POA: Diagnosis not present

## 2015-04-20 DIAGNOSIS — R278 Other lack of coordination: Secondary | ICD-10-CM

## 2015-04-20 NOTE — Therapy (Signed)
[facility] [address] [place], [place], [address] Phone: [phone]   Fax:  [phone]  Occupational Therapy Treatment  Patient Details  Name: [name] MRN: [id number] Date of Birth: [birth year] Referring Provider: Dr. Rexene Alberts  Encounter Date: 04/20/2015      OT End of Session - 04/20/15 0857    Visit Number 10   Number of Visits 18  10+8=18   Date for OT Re-Evaluation 05/14/15   Authorization Type Medicare, Aetna (G-code needed)   Authorization Time Period renewal completed 0/2/58 with cert. 04/20/15-06/19/15.  wk 1/4   Authorization - Visit Number 10   Authorization - Number of Visits 10   OT Start Time [phone]   OT Stop Time 0930   OT Time Calculation (min) 40 min   Activity Tolerance Patient tolerated treatment well   Behavior During Therapy [facility] for tasks assessed/performed      Past Medical History  Diagnosis Date  . Hypercholesteremia   . Parkinson's disease ([place])   . Hypertension   . Sleep disorder   . Ex-smoker   . Cancer [facility])     h/o  skin  cancer  . Sleep apnea     ?? sleep apnea....tested 2013 @ [facility]     . GERD (gastroesophageal reflux disease)   . Arthritis   . Lumbar stenosis     Past Surgical History  Procedure Laterality Date  . Tonsillectomy Bilateral   . Meniscus repair      right knee  2000  . Eye surgery      bilateral  cataracts  . Appendectomy    . Back surgery  11/2013  . Colonoscopy  11/17/2008    Rourk: Sessile polyp in the cecum status post saline-assisted piecemeal polypectomy, resolution clipping and epinephrine injection therapy performed. Shallow sigmoid diverticula.. Tubular adenoma  . Colonoscopy  11/19/2008    Fields: Large amount of liquid blood with clots seen throughout the colon. Previous [place] resolution clip remained in place. Purple spot seen in the lateral aspect polypectomy site, 3 resolution clips placed here. 6 mm sessile ascending  colon polyp seen and not manipulated.  . Colonoscopy  11/25/2008    Rourk: Blood-tinged colonic effluent, suspect recurrent post polypectomy bleeding status post placement of 3 clips on the polypectomy site on top of the 3 clips that already existed, one had fallen off since last procedure. One small polyp distal to the cecum not manipulated  . Colonoscopy  12/06/2009    Rourk: Internal hemorrhoids, scattered left-sided diverticula, cecal polyps, one snared and subsequently clipped, 2 adjacent diminutive polyps ablated. tubular adenoma  . Colonoscopy N/A 01/21/2015    Procedure: COLONOSCOPY;  Surgeon: Daneil Dolin, MD;  Location: [facility];  Service: Endoscopy;  Laterality: N/A;  730 - moved to 8:30 - Dr. has meeting, office talked to pt    There were no vitals filed for this visit.  Visit Diagnosis:  Other lack of coordination  Abnormal posture  Other symptoms and signs involving the musculoskeletal system  Other symptoms and signs involving the nervous system  Tremor, unspecified  Unsteadiness      Subjective Assessment - 04/20/15 0854    Subjective  Pt reports that he is tired from his trip, but doing well.  "I'm glad it seems to be getting a little better"  Pt reports no problems picking up pills, improvement with socks and cutting food.   Pertinent History PD diagnosis approx 5 years ago; hx of low  back surgery 11/2013; hx of bilateral RTC injuries    Patient Stated Goals improving donning coat, improve fine motor skills   Currently in Pain? No/denies           Neuro re-ed:  PWR! Moves (basic 4) in supine x 10 each with min-mod cues For incr movement amplitude.   Self Care:    Dressing:   Practiced buttoning/unbuttoning shirt on table top with min-mod cues for use of PWR! Hands prior to buttoning and use of deliberate/large amplitude movements after instruction.  Pt demo improvement with repetition and use of large amplitude movements.  Bed Mobility:   Supine>sitting with use of large amplitude movements after instruction.  Min-mod cues given.  Checked goals and discussed progress.--see goals section.                          OT Education - 04/20/15 1709    Education Details PWR! moves in supine (basic 4)   Person(s) Educated Patient   Methods Explanation;Demonstration;Verbal cues;Handout   Comprehension Verbalized understanding;Returned demonstration;Verbal cues required          OT Short Term Goals - 04/20/15 0858    OT SHORT TERM GOAL #1   Title Pt will be independent with PD-specific HEP.--check STGs 03/23/15>Modified due to missed visit extend through 04/09/15   Time 4   Period Weeks   Status On-going  04/12/15:  partially met, will benefit from updates   OT [place] #2   Title Pt will verbalize understanding of ways to prevent future complications and appropriate community resources prn.   Time 4   Period Weeks   Status Achieved  04/20/15   OT SHORT TERM GOAL #3   Title Pt will improve ability to don/doff jacket as shown by improving time on PPT#4 (with gown) in 50sec or less.   Baseline 61.43sec   Time 4   Period Weeks   Status Achieved  04/12/15:  35.56sec   OT SHORT TERM GOAL #4   Title Pt will be able to write 3 sentences with 100% accuracy and no significant incr in size.   Time 4   Period Weeks   Status On-going  approx 90% legibility with occasional min decr in size (improves when pt cued slows down); 04/12/15  min decr in size for longer words only with 95% legibility           OT Long Term Goals - 04/20/15 0859    OT LONG TERM GOAL #1   Title Pt will verbalize understanding of strategies/AE to incr ease with ADLs prn.--check LTGs 04/23/15   Time 8   Period Weeks   Status On-going  04/20/15 not fully met, needs reinforcement for carryover   OT LONG TERM GOAL #2   Title Pt will improve bradykinesia, functional reaching, coordination for ADLs as shown by improving score on box and  blocks test by at least 4 blocks bilaterally.   Baseline R-45 blocks, L-41 blocks   Time 8   Period Weeks   Status Achieved  04/20/15:  R-50blocks, L-49blocks   OT LONG TERM GOAL #3   Title Pt will improve ability to don/doff jacket as shown by improving time on PPT#4 (with gown) in 40sec or less.   Baseline 61.43sec   Time 8   Period Weeks   Status Achieved  04/12/15:  35.56sec   OT LONG TERM GOAL #4   Title Pt will be able to button/unbutton 3 buttons  on tabletop in 70sec or less for incr ease with dressing.   Baseline 95.16sec   Time 8   Period Weeks   Status On-going  04-27-2015:  72.97sec   OT LONG TERM GOAL #5   Title Pt will report incr ease with drying back, donning socks, eating, and picking up pills.   Time 8   Period Weeks   Status Achieved   OT LONG TERM GOAL #6   Title Pt will demo at least 120* bilateral shoulder flex with -20* or greater elbow extension for improved overhead reaching.   Baseline R 110* shoulder flex with -30* elbow ext;  L 110* shoulder flex with -25* elbow ext   Time 8   Status Achieved  04/27/2015:  R 130* with -20* elbow ext, L 130* with -20* elbow extension   OT LONG TERM GOAL #7   Title Pt will demo at least 135* bilateral shoulder flex with -15* or greater elbow extension for improved overhead reaching.   Time 8   Status New               Plan - 27-Apr-2015 1656    Clinical Impression Statement Pt has made good progress towards goals and demo improvement with coordination and ADL performance.  However, pt has not been seen to frequency due to vacation.  Pt would benefit from continued therapy to reinforce larger amplitude movement patterns for ADLs to ensure carryover and calibration for improved ease with ADLs, prevention of future complications, and improved quality of life.   Pt will benefit from skilled therapeutic intervention in order to improve on the following deficits (Retired) Decreased mobility;Impaired UE functional use;Decreased  knowledge of use of DME;Decreased balance;Decreased coordination;Decreased range of motion;Impaired tone   Rehab Potential Good   OT Frequency 2x / week   OT Duration 8 weeks   OT Treatment/Interventions Self-care/ADL training;Moist Heat;Electrical Stimulation;Fluidtherapy;Contrast Bath;Passive range of motion;Therapeutic activities;Cognitive remediation/compensation;DME and/or AE instruction;Parrafin;Cryotherapy;Therapeutic exercises;Manual Therapy;Neuromuscular education;Splinting;Ultrasound;Energy conservation;Therapeutic exercise;Functional Mobility Training;Patient/family education   Plan PWR! reach, PWR! moves in prone, continue large amplitude movements for ADLs/functional tasks   OT Home Exercise Plan Education issued:  seated PWR!, coordination HEP, PWR! hands (basic 4); 04/05/15  PWR! moves in modified quadraped (up, rock, twist); 2015/04/27 PWR! moves (basic 4) in supine   Consulted and Agree with Plan of Care Patient     Occupational Therapy Progress Note  Dates of Reporting Period: 02/23/15 to 27-Apr-2015  Objective Reports of Subjective Statement: see above  Objective Measurements: see above  Goal Update: see above  Plan: see above  Reason Skilled Services are Required: see above      G-Codes - 04-27-2015 1706    Functional Assessment Tool Used pt reports incr ease with drying back, donning socks, eating, and picking up pills; PPT#4 35.56sec; buttoning/unbuttoning 3 buttons on tabletop 72.97sec, writing with 95% legibility and only min decr in size for longer words   Functional Limitation Self care   Self Care Current Status (G9211) At least 20 percent but less than 40 percent impaired, limited or restricted   Self Care Goal Status (H4174) At least 1 percent but less than 20 percent impaired, limited or restricted      Problem List Patient Active Problem List   Diagnosis Date Noted  . History of colonic polyps   . Hx of adenomatous colonic polyps 01/04/2015  . Lumbar canal  stenosis 12/04/2013  . Aortic stenosis 10/21/2013  . Hypoxemia 04/16/2012  . Primary central sleep apnea 04/16/2012  . Obstructive  sleep apnea (adult) (pediatric) 04/16/2012  . Orthostatic hypotension 04/16/2012  . Sleep disturbance, unspecified 04/16/2012  . Unspecified hereditary and idiopathic peripheral neuropathy 04/16/2012  . Parkinson's 04/16/2012  . COLONIC POLYPS, ADENOMATOUS 11/19/2008  . GERD 11/19/2008  . HEMATOCHEZIA 11/19/2008  . GI BLEEDING 11/19/2008  . HYPERLIPIDEMIA-MIXED 07/03/2008  . HYPERTENSION, UNSPECIFIED 07/03/2008    [facility] 04/20/2015, 5:15 PM  [facility] [address] [place], [place], [address] Phone: [phone]   Fax:  [phone]  Name: [name] MRN: [id number] Date of Birth: [birth year]  Vianne Bulls, OTR/L [facility] [address] [place], [place]  [address] [phone] phone [phone] 04/20/2015 5:15 PM

## 2015-04-20 NOTE — Addendum Note (Signed)
Addended by: Vianne Bulls D on: 04/20/2015 05:18 PM   Modules accepted: Orders

## 2015-04-22 ENCOUNTER — Ambulatory Visit: Payer: Medicare Other | Admitting: Occupational Therapy

## 2015-04-22 DIAGNOSIS — R251 Tremor, unspecified: Secondary | ICD-10-CM | POA: Diagnosis not present

## 2015-04-22 DIAGNOSIS — R278 Other lack of coordination: Secondary | ICD-10-CM

## 2015-04-22 DIAGNOSIS — R29898 Other symptoms and signs involving the musculoskeletal system: Secondary | ICD-10-CM

## 2015-04-22 DIAGNOSIS — R2681 Unsteadiness on feet: Secondary | ICD-10-CM | POA: Diagnosis not present

## 2015-04-22 DIAGNOSIS — R29818 Other symptoms and signs involving the nervous system: Secondary | ICD-10-CM | POA: Diagnosis not present

## 2015-04-22 DIAGNOSIS — R293 Abnormal posture: Secondary | ICD-10-CM | POA: Diagnosis not present

## 2015-04-22 NOTE — Therapy (Signed)
[facility] [address] [place], [place], [address] Phone: [phone]   Fax:  [phone]  Occupational Therapy Treatment  Patient Details  Name: [name] MRN: [id number] Date of Birth: February 27, 1931 Referring Provider: Dr. Rexene Alberts  Encounter Date: 04/22/2015      OT End of Session - 04/22/15 0813    Visit Number 11   Number of Visits 18  10+8=18   Date for OT Re-Evaluation 05/14/15   Authorization Type Medicare, Aetna (G-code needed)   Authorization Time Period renewal completed 06/22/57 with cert. 04/20/15-06/19/15.  wk 1/4   Authorization - Visit Number 11   Authorization - Number of Visits 20   OT Start Time 0804   OT Stop Time 0845   OT Time Calculation (min) 41 min   Activity Tolerance Patient tolerated treatment well   Behavior During Therapy [facility] for tasks assessed/performed      Past Medical History  Diagnosis Date  . Hypercholesteremia   . Parkinson's disease ([place])   . Hypertension   . Sleep disorder   . Ex-smoker   . Cancer [facility])     h/o  skin  cancer  . Sleep apnea     ?? sleep apnea....tested 2013 @ [facility]     . GERD (gastroesophageal reflux disease)   . Arthritis   . Lumbar stenosis     Past Surgical History  Procedure Laterality Date  . Tonsillectomy Bilateral   . Meniscus repair      right knee  2000  . Eye surgery      bilateral  cataracts  . Appendectomy    . Back surgery  11/2013  . Colonoscopy  11/17/2008    Rourk: Sessile polyp in the cecum status post saline-assisted piecemeal polypectomy, resolution clipping and epinephrine injection therapy performed. Shallow sigmoid diverticula.. Tubular adenoma  . Colonoscopy  11/19/2008    Fields: Large amount of liquid blood with clots seen throughout the colon. Previous [place] resolution clip remained in place. Purple spot seen in the lateral aspect polypectomy site, 3 resolution clips placed here. 6 mm sessile ascending  colon polyp seen and not manipulated.  . Colonoscopy  11/25/2008    Rourk: Blood-tinged colonic effluent, suspect recurrent post polypectomy bleeding status post placement of 3 clips on the polypectomy site on top of the 3 clips that already existed, one had fallen off since last procedure. One small polyp distal to the cecum not manipulated  . Colonoscopy  12/06/2009    Rourk: Internal hemorrhoids, scattered left-sided diverticula, cecal polyps, one snared and subsequently clipped, 2 adjacent diminutive polyps ablated. tubular adenoma  . Colonoscopy N/A 01/21/2015    Procedure: COLONOSCOPY;  Surgeon: Daneil Dolin, MD;  Location: [facility];  Service: Endoscopy;  Laterality: N/A;  730 - moved to 8:30 - Dr. has meeting, office talked to pt    There were no vitals filed for this visit.  Visit Diagnosis:  Other lack of coordination  Abnormal posture  Other symptoms and signs involving the musculoskeletal system  Other symptoms and signs involving the nervous system  Tremor, unspecified  Unsteadiness      Subjective Assessment - 04/22/15 0811    Subjective  hasn't had a chance to try supine ex yet   Pertinent History PD diagnosis approx 5 years ago; hx of low back surgery 11/2013; hx of bilateral RTC injuries    Patient Stated Goals improving donning coat, improve fine motor skills   Currently in Pain? No/denies  Neuro re-ed:  PWR! Moves (basic 4) in supine x 10 each with min cues For incr movement amplitude.  PWR! Moves (up, rock, twist) in prone x 10 each with min cues For incr movement amplitude.   In sitting, functional reaching in ER/overhead with trunk rotation/wt. To place large pegs in vertical pegboard with set-up for large amplitude movements and min cues for PWR! Hands.  Then with manipulating 2 in hand for incr coordination with min difficulty.                          OT Short Term Goals - 04/20/15 0858    OT SHORT TERM GOAL  #1   Title Pt will be independent with PD-specific HEP.--check STGs 03/23/15>Modified due to missed visit extend through 04/09/15   Time 4   Period Weeks   Status On-going  04/12/15:  partially met, will benefit from updates   OT [place] #2   Title Pt will verbalize understanding of ways to prevent future complications and appropriate community resources prn.   Time 4   Period Weeks   Status Achieved  04/20/15   OT SHORT TERM GOAL #3   Title Pt will improve ability to don/doff jacket as shown by improving time on PPT#4 (with gown) in 50sec or less.   Baseline 61.43sec   Time 4   Period Weeks   Status Achieved  04/12/15:  35.56sec   OT SHORT TERM GOAL #4   Title Pt will be able to write 3 sentences with 100% accuracy and no significant incr in size.   Time 4   Period Weeks   Status On-going  approx 90% legibility with occasional min decr in size (improves when pt cued slows down); 04/12/15  min decr in size for longer words only with 95% legibility           OT Long Term Goals - 04/20/15 0859    OT LONG TERM GOAL #1   Title Pt will verbalize understanding of strategies/AE to incr ease with ADLs prn.--check LTGs 04/23/15   Time 8   Period Weeks   Status On-going  04/20/15 not fully met, needs reinforcement for carryover   OT LONG TERM GOAL #2   Title Pt will improve bradykinesia, functional reaching, coordination for ADLs as shown by improving score on box and blocks test by at least 4 blocks bilaterally.   Baseline R-45 blocks, L-41 blocks   Time 8   Period Weeks   Status Achieved  04/20/15:  R-50blocks, L-49blocks   OT LONG TERM GOAL #3   Title Pt will improve ability to don/doff jacket as shown by improving time on PPT#4 (with gown) in 40sec or less.   Baseline 61.43sec   Time 8   Period Weeks   Status Achieved  04/12/15:  35.56sec   OT LONG TERM GOAL #4   Title Pt will be able to button/unbutton 3 buttons on tabletop in 70sec or less for incr ease with dressing.    Baseline 95.16sec   Time 8   Period Weeks   Status On-going  04/20/15:  72.97sec   OT LONG TERM GOAL #5   Title Pt will report incr ease with drying back, donning socks, eating, and picking up pills.   Time 8   Period Weeks   Status Achieved   OT LONG TERM GOAL #6   Title Pt will demo at least 120* bilateral shoulder flex with -20* or greater  elbow extension for improved overhead reaching.   Baseline R 110* shoulder flex with -30* elbow ext;  L 110* shoulder flex with -25* elbow ext   Time 8   Status Achieved  04/20/15:  R 130* with -20* elbow ext, L 130* with -20* elbow extension   OT LONG TERM GOAL #7   Title Pt will demo at least 135* bilateral shoulder flex with -15* or greater elbow extension for improved overhead reaching.   Time 8   Status New               Plan - 04/22/15 0813    Clinical Impression Statement Pt continues to progress towards goals for larger amplitude movements, but continues to need min cues.   Plan cont with large amplitude movements for functional tasks/ADLs, coordination   OT Home Exercise Plan Education issued:  seated PWR!, coordination HEP, PWR! hands (basic 4); 04/05/15  PWR! moves in modified quadraped (up, rock, twist); 04/20/15 PWR! moves (basic 4) in supine   Consulted and Agree with Plan of Care Patient        Problem List Patient Active Problem List   Diagnosis Date Noted  . History of colonic polyps   . Hx of adenomatous colonic polyps 01/04/2015  . Lumbar canal stenosis 12/04/2013  . Aortic stenosis 10/21/2013  . Hypoxemia 04/16/2012  . Primary central sleep apnea 04/16/2012  . Obstructive sleep apnea (adult) (pediatric) 04/16/2012  . Orthostatic hypotension 04/16/2012  . Sleep disturbance, unspecified 04/16/2012  . Unspecified hereditary and idiopathic peripheral neuropathy 04/16/2012  . Parkinson's 04/16/2012  . COLONIC POLYPS, ADENOMATOUS 11/19/2008  . GERD 11/19/2008  . HEMATOCHEZIA 11/19/2008  . GI BLEEDING 11/19/2008  .  HYPERLIPIDEMIA-MIXED 07/03/2008  . HYPERTENSION, UNSPECIFIED 07/03/2008    [facility] 04/22/2015, 8:21 AM  [facility] [address] [place], [place], [address] Phone: [phone]   Fax:  [phone]  Name: [name] MRN: [id number] Date of Birth: 1931-02-19  Vianne Bulls, OTR/L [facility] [address] [place], [place]  [address] [phone] phone [phone] 04/22/2015 8:21 AM

## 2015-04-26 ENCOUNTER — Ambulatory Visit: Payer: Medicare Other | Admitting: Occupational Therapy

## 2015-04-26 DIAGNOSIS — R278 Other lack of coordination: Secondary | ICD-10-CM

## 2015-04-26 DIAGNOSIS — R29818 Other symptoms and signs involving the nervous system: Secondary | ICD-10-CM | POA: Diagnosis not present

## 2015-04-26 DIAGNOSIS — R251 Tremor, unspecified: Secondary | ICD-10-CM | POA: Diagnosis not present

## 2015-04-26 DIAGNOSIS — R2681 Unsteadiness on feet: Secondary | ICD-10-CM | POA: Diagnosis not present

## 2015-04-26 DIAGNOSIS — R29898 Other symptoms and signs involving the musculoskeletal system: Secondary | ICD-10-CM | POA: Diagnosis not present

## 2015-04-26 DIAGNOSIS — R293 Abnormal posture: Secondary | ICD-10-CM | POA: Diagnosis not present

## 2015-04-26 NOTE — Therapy (Signed)
[facility] [address] [place], [place], [address] Phone: [phone]   Fax:  [phone]  Occupational Therapy Treatment  Patient Details  Name: [name] MRN: [id number] Date of Birth: 1931/06/15 Referring Provider: Dr. Rexene Alberts  Encounter Date: 04/26/2015      OT End of Session - 04/26/15 0807    Visit Number 12   Number of Visits 18  10+8=18   Date for OT Re-Evaluation 05/14/15   Authorization Type Medicare, Aetna (G-code needed)   Authorization Time Period renewal completed 06/24/60 with cert. 04/20/15-06/19/15.  wk 1/4   Authorization - Visit Number 12   Authorization - Number of Visits 20   OT Start Time [phone]   OT Stop Time 0845   OT Time Calculation (min) 41 min   Activity Tolerance Patient tolerated treatment well   Behavior During Therapy [facility] for tasks assessed/performed      Past Medical History  Diagnosis Date  . Hypercholesteremia   . Parkinson's disease ([place])   . Hypertension   . Sleep disorder   . Ex-smoker   . Cancer [facility])     h/o  skin  cancer  . Sleep apnea     ?? sleep apnea....tested 2013 @ [facility]     . GERD (gastroesophageal reflux disease)   . Arthritis   . Lumbar stenosis     Past Surgical History  Procedure Laterality Date  . Tonsillectomy Bilateral   . Meniscus repair      right knee  2000  . Eye surgery      bilateral  cataracts  . Appendectomy    . Back surgery  11/2013  . Colonoscopy  11/17/2008    Rourk: Sessile polyp in the cecum status post saline-assisted piecemeal polypectomy, resolution clipping and epinephrine injection therapy performed. Shallow sigmoid diverticula.. Tubular adenoma  . Colonoscopy  11/19/2008    Fields: Large amount of liquid blood with clots seen throughout the colon. Previous [place] resolution clip remained in place. Purple spot seen in the lateral aspect polypectomy site, 3 resolution clips placed here. 6 mm sessile ascending  colon polyp seen and not manipulated.  . Colonoscopy  11/25/2008    Rourk: Blood-tinged colonic effluent, suspect recurrent post polypectomy bleeding status post placement of 3 clips on the polypectomy site on top of the 3 clips that already existed, one had fallen off since last procedure. One small polyp distal to the cecum not manipulated  . Colonoscopy  12/06/2009    Rourk: Internal hemorrhoids, scattered left-sided diverticula, cecal polyps, one snared and subsequently clipped, 2 adjacent diminutive polyps ablated. tubular adenoma  . Colonoscopy N/A 01/21/2015    Procedure: COLONOSCOPY;  Surgeon: Daneil Dolin, MD;  [facility]: [facility];  Service: Endoscopy;  Laterality: N/A;  730 - moved to 8:30 - Dr. has meeting, office talked to pt    There were no vitals filed for this visit.      Subjective Assessment - 04/26/15 0806    Subjective  "I'm here"   Pertinent History PD diagnosis approx 5 years ago; hx of low back surgery 11/2013; hx of bilateral RTC injuries    Patient Stated Goals improving donning coat, improve fine motor skills   Currently in Pain? No/denies          Neuro re-ed:  PWR! Moves (up, rock, twist) in prone x 10 each with min-mod cues For incr movement amplitude.  PWR! Moves (basic 4) in modified quadraped x 10 each with min-mod  cues For incr movement amplitude.  Arm bike x5 min level 1 for reciprocal movement with cues/target of at least 46 rpms for intensity while maintaining movement amplitude/reciprocal movement.   Pt maintained 39-44rpms. (forward/backwards)  In sitting, functional reaching into place small pegs in vertical pegboard with set-up for large amplitude movements and min cues for PWR! Hands to copy design for cognitive component with min difficulty with coordination.   In sitting, functional reaching in diagonal pattern incorporating trunk rotation/wt. shift and PWR! reach to pick up and toss scarves with PWR! Hands (up and step) with min cues  for large amplitude.  PWR! Multi-directional Movements:    Stepping to given sequence with min difficulty/cues for dual task/cognitive component.   Followed by multi-directional reaching with stepping with min difficulty/cues for dual task/cognitive component.           .                          OT Short Term Goals - 04/20/15 1448    OT SHORT TERM GOAL #1   Title Pt will be independent with PD-specific HEP.--check STGs 03/23/15>Modified due to missed visit extend through 04/09/15   Time 4   Period Weeks   Status On-going  04/12/15:  partially met, will benefit from updates   OT [place] #2   Title Pt will verbalize understanding of ways to prevent future complications and appropriate community resources prn.   Time 4   Period Weeks   Status Achieved  04/20/15   OT SHORT TERM GOAL #3   Title Pt will improve ability to don/doff jacket as shown by improving time on PPT#4 (with gown) in 50sec or less.   Baseline 61.43sec   Time 4   Period Weeks   Status Achieved  04/12/15:  35.56sec   OT SHORT TERM GOAL #4   Title Pt will be able to write 3 sentences with 100% accuracy and no significant incr in size.   Time 4   Period Weeks   Status On-going  approx 90% legibility with occasional min decr in size (improves when pt cued slows down); 04/12/15  min decr in size for longer words only with 95% legibility           OT Long Term Goals - 04/20/15 0859    OT LONG TERM GOAL #1   Title Pt will verbalize understanding of strategies/AE to incr ease with ADLs prn.--check LTGs 04/23/15   Time 8   Period Weeks   Status On-going  04/20/15 not fully met, needs reinforcement for carryover   OT LONG TERM GOAL #2   Title Pt will improve bradykinesia, functional reaching, coordination for ADLs as shown by improving score on box and blocks test by at least 4 blocks bilaterally.   Baseline R-45 blocks, L-41 blocks   Time 8   Period Weeks   Status Achieved  04/20/15:   R-50blocks, L-49blocks   OT LONG TERM GOAL #3   Title Pt will improve ability to don/doff jacket as shown by improving time on PPT#4 (with gown) in 40sec or less.   Baseline 61.43sec   Time 8   Period Weeks   Status Achieved  04/12/15:  35.56sec   OT LONG TERM GOAL #4   Title Pt will be able to button/unbutton 3 buttons on tabletop in 70sec or less for incr ease with dressing.   Baseline 95.16sec   Time 8   Period Weeks  Status On-going  04/20/15:  72.97sec   OT LONG TERM GOAL #5   Title Pt will report incr ease with drying back, donning socks, eating, and picking up pills.   Time 8   Period Weeks   Status Achieved   OT LONG TERM GOAL #6   Title Pt will demo at least 120* bilateral shoulder flex with -20* or greater elbow extension for improved overhead reaching.   Baseline R 110* shoulder flex with -30* elbow ext;  L 110* shoulder flex with -25* elbow ext   Time 8   Status Achieved  04/20/15:  R 130* with -20* elbow ext, L 130* with -20* elbow extension   OT LONG TERM GOAL #7   Title Pt will demo at least 135* bilateral shoulder flex with -15* or greater elbow extension for improved overhead reaching.   Time 8   Status New             Patient will benefit from skilled therapeutic intervention in order to improve the following deficits and impairments:     Visit Diagnosis: Other symptoms and signs involving the nervous system  Other symptoms and signs involving the musculoskeletal system  Abnormal posture  Other lack of coordination  Tremor, unspecified  Unsteadiness    Problem List Patient Active Problem List   Diagnosis Date Noted  . History of colonic polyps   . Hx of adenomatous colonic polyps 01/04/2015  . Lumbar canal stenosis 12/04/2013  . Aortic stenosis 10/21/2013  . Hypoxemia 04/16/2012  . Primary central sleep apnea 04/16/2012  . Obstructive sleep apnea (adult) (pediatric) 04/16/2012  . Orthostatic hypotension 04/16/2012  . Sleep  disturbance, unspecified 04/16/2012  . Unspecified hereditary and idiopathic peripheral neuropathy 04/16/2012  . Parkinson's 04/16/2012  . COLONIC POLYPS, ADENOMATOUS 11/19/2008  . GERD 11/19/2008  . HEMATOCHEZIA 11/19/2008  . GI BLEEDING 11/19/2008  . HYPERLIPIDEMIA-MIXED 07/03/2008  . HYPERTENSION, UNSPECIFIED 07/03/2008    [facility] 04/26/2015, 8:08 AM  [facility] [address] [place], [place], [address] Phone: [phone]   Fax:  [phone]  Name: [name] MRN: [id number] Date of Birth: 07-May-1931  Vianne Bulls, OTR/L [facility] [address] [place], [place]  [address] [phone] phone [phone] 04/26/2015 8:08 AM

## 2015-04-29 ENCOUNTER — Ambulatory Visit: Payer: Medicare Other | Admitting: Occupational Therapy

## 2015-04-29 DIAGNOSIS — R2681 Unsteadiness on feet: Secondary | ICD-10-CM | POA: Diagnosis not present

## 2015-04-29 DIAGNOSIS — R293 Abnormal posture: Secondary | ICD-10-CM | POA: Diagnosis not present

## 2015-04-29 DIAGNOSIS — R278 Other lack of coordination: Secondary | ICD-10-CM

## 2015-04-29 DIAGNOSIS — R251 Tremor, unspecified: Secondary | ICD-10-CM | POA: Diagnosis not present

## 2015-04-29 DIAGNOSIS — R29818 Other symptoms and signs involving the nervous system: Secondary | ICD-10-CM | POA: Diagnosis not present

## 2015-04-29 DIAGNOSIS — R29898 Other symptoms and signs involving the musculoskeletal system: Secondary | ICD-10-CM

## 2015-04-29 NOTE — Therapy (Addendum)
[facility] [address] [place], [place], [address] Phone: [phone]   Fax:  [phone]  Occupational Therapy Treatment  Patient Details  Name: [name] MRN: [id number] Date of Birth: Jun 23, 1931 Referring Provider: Dr. Rexene Alberts  Encounter Date: 04/29/2015      OT End of Session - 04/29/15 1043    Visit Number 13   Number of Visits 18  10+8=18   Date for OT Re-Evaluation 05/14/15   Authorization Type Medicare, Aetna (G-code needed)   Authorization Time Period renewal completed 07/18/51 with cert. 04/20/15-06/19/15.  wk 1/4   Authorization - Visit Number 13   Authorization - Number of Visits 20   OT Start Time 1019   OT Stop Time 1100   OT Time Calculation (min) 41 min   Activity Tolerance Patient tolerated treatment well   Behavior During Therapy WFL for tasks assessed/performed      Past Medical History  Diagnosis Date  . Hypercholesteremia   . Parkinson's disease ([place])   . Hypertension   . Sleep disorder   . Ex-smoker   . Cancer [facility])     h/o  skin  cancer  . Sleep apnea     ?? sleep apnea....tested 2013 @ [facility]     . GERD (gastroesophageal reflux disease)   . Arthritis   . Lumbar stenosis     Past Surgical History  Procedure Laterality Date  . Tonsillectomy Bilateral   . Meniscus repair      right knee  2000  . Eye surgery      bilateral  cataracts  . Appendectomy    . Back surgery  11/2013  . Colonoscopy  11/17/2008    Rourk: Sessile polyp in the cecum status post saline-assisted piecemeal polypectomy, resolution clipping and epinephrine injection therapy performed. Shallow sigmoid diverticula.. Tubular adenoma  . Colonoscopy  11/19/2008    Fields: Large amount of liquid blood with clots seen throughout the colon. Previous [place] resolution clip remained in place. Purple spot seen in the lateral aspect polypectomy site, 3 resolution clips placed here. 6 mm sessile ascending  colon polyp seen and not manipulated.  . Colonoscopy  11/25/2008    Rourk: Blood-tinged colonic effluent, suspect recurrent post polypectomy bleeding status post placement of 3 clips on the polypectomy site on top of the 3 clips that already existed, one had fallen off since last procedure. One small polyp distal to the cecum not manipulated  . Colonoscopy  12/06/2009    Rourk: Internal hemorrhoids, scattered left-sided diverticula, cecal polyps, one snared and subsequently clipped, 2 adjacent diminutive polyps ablated. tubular adenoma  . Colonoscopy N/A 01/21/2015    Procedure: COLONOSCOPY;  Surgeon: Daneil Dolin, MD;  Location[facility];  Service: Endoscopy;  Laterality: N/A;  730 - moved to 8:30 - Dr. has meeting, office talked to pt    There were no vitals filed for this visit.      Subjective Assessment - 04/29/15 1037    Subjective  Pt reports using PWR! hands when planting peas and that coordination has improved    Pertinent History PD diagnosis approx 5 years ago; hx of low back surgery 11/2013; hx of bilateral RTC injuries    Patient Stated Goals improving donning coat, improve fine motor skills   Currently in Pain? No/denies                       OT Treatments/Exercises (OP) - 04/29/15 0001  ADLs   Writing Practiced writing in cursive (8 lines) with good legibility and no significant decr in size.  Discussed progress.   Fine Motor Coordination   Small Pegboard Copying small peg design for cognitive component/coordination with min v.c. to use PWR! hands prior to grasp with min difficulty (ea. hand) then removing with PWR! step with mod difficulty/cues ea. hand for incr movement amplitude.   Flipping cards with min cues for large amplitude with each hand   Other Fine Motor Exercises Sliding cards across table with min cueing for PWR! hands (finger ext) with each hand.  Pt needed min v.c.        Neuro re-ed:  PWR! Moves (basic 4) in modified quadraped x  10 each with min cues For incr movement amplitude.    In standing, functional reaching in diagonal pattern incorporating trunk rotation/wt. shift and PWR! reach to grasp/release cylinder objects with min cueing for large amplitude movements                     OT Short Term Goals - 04/29/15 1700    OT SHORT TERM GOAL #1   Title Pt will be independent with PD-specific HEP.--check STGs 03/23/15>Modified due to missed visit extend through 04/09/15   Time 4   Period Weeks   Status On-going  04/12/15:  partially met, will benefit from updates   OT [place] #2   Title Pt will verbalize understanding of ways to prevent future complications and appropriate community resources prn.   Time 4   Period Weeks   Status Achieved  04/20/15   OT SHORT TERM GOAL #3   Title Pt will improve ability to don/doff jacket as shown by improving time on PPT#4 (with gown) in 50sec or less.   Baseline 61.43sec   Time 4   Period Weeks   Status Achieved  04/12/15:  35.56sec   OT SHORT TERM GOAL #4   Title Pt will be able to write 3 sentences with 100% accuracy and no significant incr in size.   Time 4   Period Weeks   Status Achieved  approx 90% legibility with occasional min decr in size (improves when pt cued slows down); 04/12/15  min decr in size for longer words only with 95% legibility;  met 04/29/15           OT Long Term Goals - 04/20/15 0859    OT LONG TERM GOAL #1   Title Pt will verbalize understanding of strategies/AE to incr ease with ADLs prn.--check LTGs 04/23/15   Time 8   Period Weeks   Status On-going  04/20/15 not fully met, needs reinforcement for carryover   OT LONG TERM GOAL #2   Title Pt will improve bradykinesia, functional reaching, coordination for ADLs as shown by improving score on box and blocks test by at least 4 blocks bilaterally.   Baseline R-45 blocks, L-41 blocks   Time 8   Period Weeks   Status Achieved  04/20/15:  R-50blocks, L-49blocks   OT LONG  TERM GOAL #3   Title Pt will improve ability to don/doff jacket as shown by improving time on PPT#4 (with gown) in 40sec or less.   Baseline 61.43sec   Time 8   Period Weeks   Status Achieved  04/12/15:  35.56sec   OT LONG TERM GOAL #4   Title Pt will be able to button/unbutton 3 buttons on tabletop in 70sec or less for incr ease with dressing.  Baseline 95.16sec   Time 8   Period Weeks   Status On-going  04/20/15:  72.97sec   OT LONG TERM GOAL #5   Title Pt will report incr ease with drying back, donning socks, eating, and picking up pills.   Time 8   Period Weeks   Status Achieved   OT LONG TERM GOAL #6   Title Pt will demo at least 120* bilateral shoulder flex with -20* or greater elbow extension for improved overhead reaching.   Baseline R 110* shoulder flex with -30* elbow ext;  L 110* shoulder flex with -25* elbow ext   Time 8   Status Achieved  04/20/15:  R 130* with -20* elbow ext, L 130* with -20* elbow extension   OT LONG TERM GOAL #7   Title Pt will demo at least 135* bilateral shoulder flex with -15* or greater elbow extension for improved overhead reaching.   Time 8   Status New               Plan - 04/29/15 1054    Clinical Impression Statement Pt continues to progress towards goals with improvin gmovement amplitude, but with cognitive components/complex movements, pt needs min cueing.   Plan continue with large amplitude movements for functional tasks/ADLs, coordination   OT Home Exercise Plan Education issued:  seated PWR!, coordination HEP, PWR! hands (basic 4); 04/05/15  PWR! moves in modified quadraped (up, rock, twist); 04/20/15 PWR! moves (basic 4) in supine   Consulted and Agree with Plan of Care Patient      Patient will benefit from skilled therapeutic intervention in order to improve the following deficits and impairments:     Visit Diagnosis: Other symptoms and signs involving the nervous system  Other symptoms and signs involving the  musculoskeletal system  Abnormal posture  Other lack of coordination    Problem List Patient Active Problem List   Diagnosis Date Noted  . History of colonic polyps   . Hx of adenomatous colonic polyps 01/04/2015  . Lumbar canal stenosis 12/04/2013  . Aortic stenosis 10/21/2013  . Hypoxemia 04/16/2012  . Primary central sleep apnea 04/16/2012  . Obstructive sleep apnea (adult) (pediatric) 04/16/2012  . Orthostatic hypotension 04/16/2012  . Sleep disturbance, unspecified 04/16/2012  . Unspecified hereditary and idiopathic peripheral neuropathy 04/16/2012  . Parkinson's 04/16/2012  . COLONIC POLYPS, ADENOMATOUS 11/19/2008  . GERD 11/19/2008  . HEMATOCHEZIA 11/19/2008  . GI BLEEDING 11/19/2008  . HYPERLIPIDEMIA-MIXED 07/03/2008  . HYPERTENSION, UNSPECIFIED 07/03/2008    [facility] 04/29/2015, 5:02 PM  [facility] [address] [place], [place], [address] Phone: [phone]   Fax:  [phone]  Name: [name] MRN: [id number] Date of Birth: Aug 10, 1931  Vianne Bulls, OTR/L [facility] [address] [place], [place]  [address] [phone] phone [phone] 04/29/2015 5:02 PM

## 2015-05-03 ENCOUNTER — Ambulatory Visit: Payer: Medicare Other | Admitting: Occupational Therapy

## 2015-05-03 DIAGNOSIS — R2681 Unsteadiness on feet: Secondary | ICD-10-CM | POA: Diagnosis not present

## 2015-05-03 DIAGNOSIS — R278 Other lack of coordination: Secondary | ICD-10-CM | POA: Diagnosis not present

## 2015-05-03 DIAGNOSIS — R29818 Other symptoms and signs involving the nervous system: Secondary | ICD-10-CM

## 2015-05-03 DIAGNOSIS — R251 Tremor, unspecified: Secondary | ICD-10-CM

## 2015-05-03 DIAGNOSIS — R293 Abnormal posture: Secondary | ICD-10-CM | POA: Diagnosis not present

## 2015-05-03 DIAGNOSIS — R29898 Other symptoms and signs involving the musculoskeletal system: Secondary | ICD-10-CM

## 2015-05-03 NOTE — Therapy (Signed)
[facility] [address] [place], [place], [address] Phone: [phone]   Fax:  [phone]  Occupational Therapy Treatment  Patient Details  Name: [name] MRN: [id number] Date of Birth: February 11, 1931 Referring Provider: Dr. Rexene Alberts  Encounter Date: 05/03/2015      OT End of Session - 05/03/15 0808    Visit Number 14   Number of Visits 18  10+8=18   Date for OT Re-Evaluation 05/14/15   Authorization Type Medicare, Aet[employer] (G-code needed)   Authorization Time Period renewal completed 09/18/21 with cert. 04/20/15-06/19/15.  wk 1/4   Authorization - Visit Number 14   Authorization - Number of Visits 20   OT Start Time 0803   OT Stop Time 0845   OT Time Calculation (min) 42 min   Activity Tolerance Patient tolerated treatment well   Behavior During Therapy WFL for tasks assessed/performed      Past Medical History  Diagnosis Date  . Hypercholesteremia   . Parkinson's disease ([place])   . Hypertension   . Sleep disorder   . Ex-smoker   . Cancer [facility])     h/o  skin  cancer  . Sleep apnea     ?? sleep apnea....tested 2013 @ [facility]     . GERD (gastroesophageal reflux disease)   . Arthritis   . Lumbar stenosis     Past Surgical History  Procedure Laterality Date  . Tonsillectomy Bilateral   . Meniscus repair      right knee  2000  . Eye surgery      bilateral  cataracts  . Appendectomy    . Back surgery  11/2013  . Colonoscopy  11/17/2008    Rourk: Sessile polyp in the cecum status post saline-assisted piecemeal polypectomy, resolution clipping and epinephrine injection therapy performed. Shallow sigmoid diverticula.. Tubular adenoma  . Colonoscopy  11/19/2008    Fields: Large amount of liquid blood with clots seen throughout the colon. Previous [place] resolution clip remained in place. Purple spot seen in the lateral aspect polypectomy site, 3 resolution clips placed here. 6 mm sessile ascending  colon polyp seen and not manipulated.  . Colonoscopy  11/25/2008    Rourk: Blood-tinged colonic effluent, suspect recurrent post polypectomy bleeding status post placement of 3 clips on the polypectomy site on top of the 3 clips that already existed, one had fallen off since last procedure. One small polyp distal to the cecum not manipulated  . Colonoscopy  12/06/2009    Rourk: Internal hemorrhoids, scattered left-sided diverticula, cecal polyps, one snared and subsequently clipped, 2 adjacent diminutive polyps ablated. tubular adenoma  . Colonoscopy N/A 01/21/2015    Procedure: COLONOSCOPY;  Surgeon: Daneil Dolin, MD;  [facility];  Service: Endoscopy;  Laterality: N/A;  730 - moved to 8:30 - Dr. has meeting, office talked to pt    There were no vitals filed for this visit.      Subjective Assessment - 05/03/15 0809    Subjective  "wonderful"   Pertinent History PD diagnosis approx 5 years ago; hx of low back surgery 11/2013; hx of bilateral RTC injuries    Patient Stated Goals improving donning coat, improve fine motor skills   Currently in Pain? No/denies           Neuro re-ed:  PWR! Moves (basic 4) in supine x 10 each with min cues For incr movement amplitude.  PWR! Hands (basic 4) x 10 each with min cues For incr movement  amplitude.  In standing, functional reaching in diagonal pattern incorporating trunk rotation/wt. shift and PWR! Reach/hands to toss scarves min cueing with each UE.  Then tolling/catching 2 alternating UEs then tossing/catching 2 simultaneously with therapist with min cues for PWR! Hands/large movement amplitude.     Self Care:    Dressing:   Practiced buttoning/unbuttoning shirt on table top with min cues for use of PWR! Hands prior to buttoning and use of deliberate/large amplitude movements after instruction.  Pt demo improvement with repetition and use of large amplitude movements.     Coordination:  Rotating 2 balls in each hand  with min-mod difficulty for incr in-hand manipulation.                       OT Education - 05/03/15 0831    Education Details button hook use   Person(s) Educated Patient   Methods Explanation;Demonstration;Verbal cues   Comprehension Verbalized understanding;Returned demonstration          OT Short Term Goals - 04/29/15 1700    OT SHORT TERM GOAL #1   Title Pt will be independent with PD-specific HEP.--check STGs 03/23/15>Modified due to missed visit extend through 04/09/15   Time 4   Period Weeks   Status On-going  04/12/15:  partially met, will benefit from updates   OT [facility] [place] #2   Title Pt will verbalize understanding of ways to prevent future complications and appropriate community resources prn.   Time 4   Period Weeks   Status Achieved  04/20/15   OT SHORT TERM GOAL #3   Title Pt will improve ability to don/doff jacket as shown by improving time on PPT#4 (with gown) in 50sec or less.   Baseline 61.43sec   Time 4   Period Weeks   Status Achieved  04/12/15:  35.56sec   OT SHORT TERM GOAL #4   Title Pt will be able to write 3 sentences with 100% accuracy and no significant incr in size.   Time 4   Period Weeks   Status Achieved  approx 90% legibility with occasional min decr in size (improves when pt cued slows down); 04/12/15  min decr in size for longer words only with 95% legibility;  met 04/29/15           OT Long Term Goals - 04/20/15 0859    OT LONG TERM GOAL #1   Title Pt will verbalize understanding of strategies/AE to incr ease with ADLs prn.--check LTGs 04/23/15   Time 8   Period Weeks   Status On-going  04/20/15 not fully met, needs reinforcement for carryover   OT LONG TERM GOAL #2   Title Pt will improve bradykinesia, functional reaching, coordination for ADLs as shown by improving score on box and blocks test by at least 4 blocks bilaterally.   Baseline R-45 blocks, L-41 blocks   Time 8   Period Weeks   Status Achieved   04/20/15:  R-50blocks, L-49blocks   OT LONG TERM GOAL #3   Title Pt will improve ability to don/doff jacket as shown by improving time on PPT#4 (with gown) in 40sec or less.   Baseline 61.43sec   Time 8   Period Weeks   Status Achieved  04/12/15:  35.56sec   OT LONG TERM GOAL #4   Title Pt will be able to button/unbutton 3 buttons on tabletop in 70sec or less for incr ease with dressing.   Baseline 95.16sec   Time 8  Period Weeks   Status On-going  04/20/15:  72.97sec   OT LONG TERM GOAL #5   Title Pt will report incr ease with drying back, donning socks, eating, and picking up pills.   Time 8   Period Weeks   Status Achieved   OT LONG TERM GOAL #6   Title Pt will demo at least 120* bilateral shoulder flex with -20* or greater elbow extension for improved overhead reaching.   Baseline R 110* shoulder flex with -30* elbow ext;  L 110* shoulder flex with -25* elbow ext   Time 8   Status Achieved  04/20/15:  R 130* with -20* elbow ext, L 130* with -20* elbow extension   OT LONG TERM GOAL #7   Title Pt will demo at least 135* bilateral shoulder flex with -15* or greater elbow extension for improved overhead reaching.   Time 8   Status New               Plan - 05/03/15 2761    Clinical Impression Statement Pt continues to progress towards goals with improving movement amplitude.     Plan continue with large amplitude movements fro functional tasks/ADLs and coordination   OT Home Exercise Plan Education issued:  seated PWR!, coordination HEP, PWR! hands (basic 4); 04/05/15  PWR! moves in modified quadraped (up, rock, twist); 04/20/15 PWR! moves (basic 4) in supine   Consulted and Agree with Plan of Care Patient      Patient will benefit from skilled therapeutic intervention in order to improve the following deficits and impairments:     Visit Diagnosis: Other symptoms and signs involving the nervous system  Other symptoms and signs involving the musculoskeletal  system  Abnormal posture  Other lack of coordination  Tremor, unspecified    Problem List Patient Active Problem List   Diagnosis Date Noted  . History of colonic polyps   . Hx of adenomatous colonic polyps 01/04/2015  . Lumbar canal stenosis 12/04/2013  . Aortic stenosis 10/21/2013  . Hypoxemia 04/16/2012  . Primary central sleep apnea 04/16/2012  . Obstructive sleep apnea (adult) (pediatric) 04/16/2012  . Orthostatic hypotension 04/16/2012  . Sleep disturbance, unspecified 04/16/2012  . Unspecified hereditary and idiopathic peripheral neuropathy 04/16/2012  . Parkinson's 04/16/2012  . COLONIC POLYPS, ADENOMATOUS 11/19/2008  . GERD 11/19/2008  . HEMATOCHEZIA 11/19/2008  . GI BLEEDING 11/19/2008  . HYPERLIPIDEMIA-MIXED 07/03/2008  . HYPERTENSION, UNSPECIFIED 07/03/2008    [facility] 05/03/2015, 8:32 AM  [facility] [address] [place], [place], [address] Phone: [phone]   Fax:  [phone]  Name: [name] MRN: [id number] Date of Birth: 05-03-1931  Vianne Bulls, OTR/L [facility] [address] [place], [place]  [address] [phone] phone [phone] 05/03/2015 8:32 AM

## 2015-05-06 ENCOUNTER — Ambulatory Visit: Payer: Medicare Other | Admitting: Occupational Therapy

## 2015-05-06 DIAGNOSIS — R29818 Other symptoms and signs involving the nervous system: Secondary | ICD-10-CM | POA: Diagnosis not present

## 2015-05-06 DIAGNOSIS — R293 Abnormal posture: Secondary | ICD-10-CM

## 2015-05-06 DIAGNOSIS — R278 Other lack of coordination: Secondary | ICD-10-CM

## 2015-05-06 DIAGNOSIS — R29898 Other symptoms and signs involving the musculoskeletal system: Secondary | ICD-10-CM | POA: Diagnosis not present

## 2015-05-06 DIAGNOSIS — R2681 Unsteadiness on feet: Secondary | ICD-10-CM | POA: Diagnosis not present

## 2015-05-06 DIAGNOSIS — R251 Tremor, unspecified: Secondary | ICD-10-CM | POA: Diagnosis not present

## 2015-05-06 NOTE — Therapy (Signed)
[facility] [address] [place], [place], [address] Phone: [phone]   Fax:  [phone]  Occupational Therapy Treatment  Patient Details  Name: [name] MRN: [id number] Date of Birth: Dec 11, 1931 Referring Provider: Dr. Rexene Alberts  Encounter Date: 05/06/2015      OT End of Session - 05/06/15 0823    Visit Number 15   Number of Visits 18  10+8=18   Date for OT Re-Evaluation 05/14/15   Authorization Type Medicare, Aetna (G-code needed)   Authorization Time Period renewal completed 08/23/48 with cert. 04/20/15-06/19/15.  wk 1/4   Authorization - Visit Number 15   Authorization - Number of Visits 20   OT Start Time [phone]   OT Stop Time 0845   OT Time Calculation (min) 39 min   Activity Tolerance Patient tolerated treatment well   Behavior During Therapy [facility] for tasks assessed/performed      Past Medical History  Diagnosis Date  . Hypercholesteremia   . Parkinson's disease ([place])   . Hypertension   . Sleep disorder   . Ex-smoker   . Cancer [facility])     h/o  skin  cancer  . Sleep apnea     ?? sleep apnea....tested 2013 @ [facility]     . GERD (gastroesophageal reflux disease)   . Arthritis   . Lumbar stenosis     Past Surgical History  Procedure Laterality Date  . Tonsillectomy Bilateral   . Meniscus repair      right knee  2000  . Eye surgery      bilateral  cataracts  . Appendectomy    . Back surgery  11/2013  . Colonoscopy  11/17/2008    Rourk: Sessile polyp in the cecum status post saline-assisted piecemeal polypectomy, resolution clipping and epinephrine injection therapy performed. Shallow sigmoid diverticula.. Tubular adenoma  . Colonoscopy  11/19/2008    Fields: Large amount of liquid blood with clots seen throughout the colon. Previous [place] resolution clip remained in place. Purple spot seen in the lateral aspect polypectomy site, 3 resolution clips placed here. 6 mm sessile ascending  colon polyp seen and not manipulated.  . Colonoscopy  11/25/2008    Rourk: Blood-tinged colonic effluent, suspect recurrent post polypectomy bleeding status post placement of 3 clips on the polypectomy site on top of the 3 clips that already existed, one had fallen off since last procedure. One small polyp distal to the cecum not manipulated  . Colonoscopy  12/06/2009    Rourk: Internal hemorrhoids, scattered left-sided diverticula, cecal polyps, one snared and subsequently clipped, 2 adjacent diminutive polyps ablated. tubular adenoma  . Colonoscopy N/A 01/21/2015    Procedure: COLONOSCOPY;  Surgeon: Daneil Dolin, MD;  [facility];  Service: Endoscopy;  Laterality: N/A;  730 - moved to 8:30 - Dr. has meeting, office talked to pt    There were no vitals filed for this visit.      Subjective Assessment - 05/06/15 0821    Subjective  "wonderful"   Pertinent History PD diagnosis approx 5 years ago; hx of low back surgery 11/2013; hx of bilateral RTC injuries    Patient Stated Goals improving donning coat, improve fine motor skills   Currently in Pain? No/denies            Neuro re-ed:  PWR! Moves (basic 4) in sitting x 10 each with min cues For incr movement amplitude.  In sitting, functional reaching laterally and across body with trunk rotation/wt. With  each UE to manipulate and place clothespins on vertical pole with min cues for PWR! Hands.                   OT Treatments/Exercises (OP) - 05/06/15 0001    Fine Motor Coordination   Grooved pegs Placing in with each hand with in-hand manipulation and use of PWR! hands with min cues and difficulty.    Sliding large cards across table with PWR! Hand and finger extension.                 OT Education - 05/06/15 [phone]    Education Details Recommendation for therapy screens/screening process   Person(s) Educated Patient   Methods Explanation   Comprehension Verbalized understanding           OT Short Term Goals - 04/29/15 1700    OT SHORT TERM GOAL #1   Title Pt will be independent with PD-specific HEP.--check STGs 03/23/15>Modified due to missed visit extend through 04/09/15   Time 4   Period Weeks   Status On-going  04/12/15:  partially met, will benefit from updates   OT [place] #2   Title Pt will verbalize understanding of ways to prevent future complications and appropriate community resources prn.   Time 4   Period Weeks   Status Achieved  04/20/15   OT SHORT TERM GOAL #3   Title Pt will improve ability to don/doff jacket as shown by improving time on PPT#4 (with gown) in 50sec or less.   Baseline 61.43sec   Time 4   Period Weeks   Status Achieved  04/12/15:  35.56sec   OT SHORT TERM GOAL #4   Title Pt will be able to write 3 sentences with 100% accuracy and no significant incr in size.   Time 4   Period Weeks   Status Achieved  approx 90% legibility with occasional min decr in size (improves when pt cued slows down); 04/12/15  min decr in size for longer words only with 95% legibility;  met 04/29/15           OT Long Term Goals - 04/20/15 0859    OT LONG TERM GOAL #1   Title Pt will verbalize understanding of strategies/AE to incr ease with ADLs prn.--check LTGs 04/23/15   Time 8   Period Weeks   Status On-going  04/20/15 not fully met, needs reinforcement for carryover   OT LONG TERM GOAL #2   Title Pt will improve bradykinesia, functional reaching, coordination for ADLs as shown by improving score on box and blocks test by at least 4 blocks bilaterally.   Baseline R-45 blocks, L-41 blocks   Time 8   Period Weeks   Status Achieved  04/20/15:  R-50blocks, L-49blocks   OT LONG TERM GOAL #3   Title Pt will improve ability to don/doff jacket as shown by improving time on PPT#4 (with gown) in 40sec or less.   Baseline 61.43sec   Time 8   Period Weeks   Status Achieved  04/12/15:  35.56sec   OT LONG TERM GOAL #4   Title Pt will be able to button/unbutton  3 buttons on tabletop in 70sec or less for incr ease with dressing.   Baseline 95.16sec   Time 8   Period Weeks   Status On-going  04/20/15:  72.97sec   OT LONG TERM GOAL #5   Title Pt will report incr ease with drying back, donning socks, eating, and picking up pills.  Time 8   Period Weeks   Status Achieved   OT LONG TERM GOAL #6   Title Pt will demo at least 120* bilateral shoulder flex with -20* or greater elbow extension for improved overhead reaching.   Baseline R 110* shoulder flex with -30* elbow ext;  L 110* shoulder flex with -25* elbow ext   Time 8   Status Achieved  04/20/15:  R 130* with -20* elbow ext, L 130* with -20* elbow extension   OT LONG TERM GOAL #7   Title Pt will demo at least 135* bilateral shoulder flex with -15* or greater elbow extension for improved overhead reaching.   Time 8   Status New               Plan - 05/06/15 2549    Clinical Impression Statement Pt continues to progress towards goals with improving movement amplitude.   Plan check remaining goals and d/c next week   OT Home Exercise Plan Education issued:  seated PWR!, coordination HEP, PWR! hands (basic 4); 04/05/15  PWR! moves in modified quadraped (up, rock, twist); 04/20/15 PWR! moves (basic 4) in supine   Consulted and Agree with Plan of Care Patient      Patient will benefit from skilled therapeutic intervention in order to improve the following deficits and impairments:     Visit Diagnosis: Other symptoms and signs involving the nervous system  Other symptoms and signs involving the musculoskeletal system  Abnormal posture  Other lack of coordination    Problem List Patient Active Problem List   Diagnosis Date Noted  . History of colonic polyps   . Hx of adenomatous colonic polyps 01/04/2015  . Lumbar canal stenosis 12/04/2013  . Aortic stenosis 10/21/2013  . Hypoxemia 04/16/2012  . Primary central sleep apnea 04/16/2012  . Obstructive sleep apnea (adult)  (pediatric) 04/16/2012  . Orthostatic hypotension 04/16/2012  . Sleep disturbance, unspecified 04/16/2012  . Unspecified hereditary and idiopathic peripheral neuropathy 04/16/2012  . Parkinson's 04/16/2012  . COLONIC POLYPS, ADENOMATOUS 11/19/2008  . GERD 11/19/2008  . HEMATOCHEZIA 11/19/2008  . GI BLEEDING 11/19/2008  . HYPERLIPIDEMIA-MIXED 07/03/2008  . HYPERTENSION, UNSPECIFIED 07/03/2008    [facility] 05/06/2015, 9:24 AM  [facility] [address] [place], [place], [address] Phone: [phone]   Fax:  [phone]  Name: TURNER BAILLIE MRN: [id number] Date of Birth: 11-28-1931  Vianne Bulls, OTR/L [facility] [address] [place], [place]  [address] [phone] phone [phone] 05/06/2015 9:24 AM

## 2015-05-10 ENCOUNTER — Ambulatory Visit: Payer: Medicare Other | Admitting: Occupational Therapy

## 2015-05-10 DIAGNOSIS — R29818 Other symptoms and signs involving the nervous system: Secondary | ICD-10-CM

## 2015-05-10 DIAGNOSIS — R293 Abnormal posture: Secondary | ICD-10-CM

## 2015-05-10 DIAGNOSIS — R278 Other lack of coordination: Secondary | ICD-10-CM | POA: Diagnosis not present

## 2015-05-10 DIAGNOSIS — R29898 Other symptoms and signs involving the musculoskeletal system: Secondary | ICD-10-CM

## 2015-05-10 DIAGNOSIS — R251 Tremor, unspecified: Secondary | ICD-10-CM | POA: Diagnosis not present

## 2015-05-10 DIAGNOSIS — R2681 Unsteadiness on feet: Secondary | ICD-10-CM | POA: Diagnosis not present

## 2015-05-10 NOTE — Therapy (Signed)
[facility] [address] [place], [place], [address] Phone: [phone]   Fax:  [phone]  Occupational Therapy Treatment  Patient Details  Name: [name] MRN: [id number] Date of Birth: 12-09-1931 Referring Provider: Dr. Rexene Alberts  Encounter Date: 05/10/2015      OT End of Session - 05/10/15 0813    Visit Number 16   Number of Visits 18  10+8=18   Date for OT Re-Evaluation 05/14/15   Authorization Type Medicare, [employer]et[employer] (G-code needed)   Authorization Time Period renewal completed 06/25/43 with cert. 04/20/15-06/19/15.  wk 4/4   Authorization - Visit Number 14   Authorization - Number of Visits 20   OT Start Time 0807   OT Stop Time 0846   OT Time Calculation (min) 39 min   Activity Tolerance Patient tolerated treatment well   Behavior During Therapy [facility] for tasks assessed/performed      Past Medical History  Diagnosis Date  . Hypercholesteremia   . Parkinson's disease ([place])   . Hypertension   . Sleep disorder   . Ex-smoker   . Cancer [facility])     h/o  skin  cancer  . Sleep apnea     ?? sleep apnea....tested 2013 @ [facility]     . GERD (gastroesophageal reflux disease)   . Arthritis   . Lumbar stenosis     Past Surgical History  Procedure Laterality Date  . Tonsillectomy Bilateral   . Meniscus repair      right knee  2000  . Eye surgery      bilateral  cataracts  . Appendectomy    . Back surgery  11/2013  . Colonoscopy  11/17/2008    Rourk: Sessile polyp in the cecum status post saline-assisted piecemeal polypectomy, resolution clipping and epinephrine injection therapy performed. Shallow sigmoid diverticula.. Tubular adenoma  . Colonoscopy  11/19/2008    Fields: Large amount of liquid blood with clots seen throughout the colon. Previous [place] resolution clip remained in place. Purple spot seen in the lateral aspect polypectomy site, 3 resolution clips placed here. 6 mm sessile ascending  colon polyp seen and not manipulated.  . Colonoscopy  11/25/2008    Rourk: Blood-tinged colonic effluent, suspect recurrent post polypectomy bleeding status post placement of 3 clips on the polypectomy site on top of the 3 clips that already existed, one had fallen off since last procedure. One small polyp distal to the cecum not manipulated  . Colonoscopy  12/06/2009    Rourk: Internal hemorrhoids, scattered left-sided diverticula, cecal polyps, one snared and subsequently clipped, 2 adjacent diminutive polyps ablated. tubular adenoma  . Colonoscopy N/A 01/21/2015    Procedure: COLONOSCOPY;  Surgeon: Daneil Dolin, MD;  Location[facility];  Service: Endoscopy;  Laterality: N/A;  730 - moved to 8:30 - Dr. has meeting, office talked to pt    There were no vitals filed for this visit.      Subjective Assessment - 05/10/15 0816    Subjective  doing well.  Pt reports no problem with turning in bed.   Pertinent History PD diagnosis approx 5 years ago; hx of low back surgery 11/2013; hx of bilateral RTC injuries    Patient Stated Goals improving donning coat, improve fine motor skills   Currently in Pain? No/denies           Neuro re-ed:  PWR! Moves (basic 4) in supine x 10-20 each with min cues For incr movement amplitude.  In sitting, functional  reaching with large cards in forward flex and ER with trunk rotation with focus on PWR! Hands/reach and supination with min-mod cueing for large amplitude  Sliding cards across table with focus/min cues for PWR! Hands with each UE.    Self Care:    Dressing:   Practiced buttoning/unbuttoning shirt on table top with min cues for use of PWR! Hands prior to buttoning and use of deliberate/large amplitude movements after instruction.  Pt demo improvement with repetition and use of large amplitude movements.   Began checking remaining goals and discussing progress--see goals section below.  Reviewed strategy for supine>sitting and pt  returned demo.             OT Treatments/Exercises (OP) - 05/10/15 0001    Fine Motor Coordination   Grooved pegs Placing in with each hand with in-hand manipulation and use of PWR! hands with min cues and difficulty.   Other Fine Motor Exercises individual finger/thumb extension with rubber band for resistance with min cues for large amplitude with each finger BUEs.                  OT Short Term Goals - 05/10/15 0832    OT SHORT TERM GOAL #1   Title Pt will be independent with PD-specific HEP.--check STGs 03/23/15>Modified due to missed visit extend through 04/09/15   Time 4   Period Weeks   Status On-going  04/12/15:  partially met, will benefit from updates   OT [place] #2   Title Pt will verbalize understanding of ways to prevent future complications and appropriate community resources prn.   Time 4   Period Weeks   Status Achieved  04/20/15   OT SHORT TERM GOAL #3   Title Pt will improve ability to don/doff jacket as shown by improving time on PPT#4 (with gown) in 50sec or less.   Baseline 61.43sec   Time 4   Period Weeks   Status Achieved  04/12/15:  35.56sec   OT SHORT TERM GOAL #4   Title Pt will be able to write 3 sentences with 100% accuracy and no significant incr in size.   Time 4   Period Weeks   Status Achieved  approx 90% legibility with occasional min decr in size (improves when pt cued slows down); 04/12/15  min decr in size for longer words only with 95% legibility;  met 04/29/15           OT Long Term Goals - 05/10/15 0833    OT LONG TERM GOAL #1   Title Pt will verbalize understanding of strategies/AE to incr ease with ADLs prn.--check LTGs 04/23/15   Time 8   Period Weeks   Status Achieved  04/20/15 not fully met, needs reinforcement for carryover; 05/10/15   OT LONG TERM GOAL #2   Title Pt will improve bradykinesia, functional reaching, coordination for ADLs as shown by improving score on box and blocks test by at least 4 blocks  bilaterally.   Baseline R-45 blocks, L-41 blocks   Time 8   Period Weeks   Status Achieved  04/20/15:  R-50blocks, L-49blocks   OT LONG TERM GOAL #3   Title Pt will improve ability to don/doff jacket as shown by improving time on PPT#4 (with gown) in 40sec or less.   Baseline 61.43sec   Time 8   Period Weeks   Status Achieved  04/12/15:  35.56sec   OT LONG TERM GOAL #4   Title Pt will be able to  button/unbutton 3 buttons on tabletop in 70sec or less for incr ease with dressing.   Baseline 95.16sec   Time 8   Period Days   Status Achieved  04/20/15:  72.97sec; 05/10/15:  97.13sec, then 53.60sec with strategies   OT LONG TERM GOAL #5   Title Pt will report incr ease with drying back, donning socks, eating, and picking up pills.   Time 8   Period Weeks   Status Achieved   OT LONG TERM GOAL #6   Title Pt will demo at least 120* bilateral shoulder flex with -20* or greater elbow extension for improved overhead reaching.   Baseline R 110* shoulder flex with -30* elbow ext;  L 110* shoulder flex with -25* elbow ext   Time 8   Status Achieved  04/20/15:  R 130* with -20* elbow ext, L 130* with -20* elbow extension   OT LONG TERM GOAL #7   Title Pt will demo at least 135* bilateral shoulder flex with -15* or greater elbow extension for improved overhead reaching.   Time 8   Status Not Met  05/10/15:  R 125* with -15*, L 125* with -10*               Plan - 05/10/15 0815    Clinical Impression Statement Pt has made good progress towards goals with improving movement amplitude with less cues.   Plan check remaining goals and d/c next session, schedule follow-up screen   OT Home Exercise Plan Education issued:  seated PWR!, coordination HEP, PWR! hands (basic 4); 04/05/15  PWR! moves in modified quadraped (up, rock, twist); 04/20/15 PWR! moves (basic 4) in supine   Consulted and Agree with Plan of Care Patient      Patient will benefit from skilled therapeutic intervention in order to  improve the following deficits and impairments:     Visit Diagnosis: Other symptoms and signs involving the nervous system  Other symptoms and signs involving the musculoskeletal system  Abnormal posture  Other lack of coordination    Problem List Patient Active Problem List   Diagnosis Date Noted  . History of colonic polyps   . Hx of adenomatous colonic polyps 01/04/2015  . Lumbar canal stenosis 12/04/2013  . Aortic stenosis 10/21/2013  . Hypoxemia 04/16/2012  . Primary central sleep apnea 04/16/2012  . Obstructive sleep apnea (adult) (pediatric) 04/16/2012  . Orthostatic hypotension 04/16/2012  . Sleep disturbance, unspecified 04/16/2012  . Unspecified hereditary and idiopathic peripheral neuropathy 04/16/2012  . Parkinson's 04/16/2012  . COLONIC POLYPS, ADENOMATOUS 11/19/2008  . GERD 11/19/2008  . HEMATOCHEZIA 11/19/2008  . GI BLEEDING 11/19/2008  . HYPERLIPIDEMIA-MIXED 07/03/2008  . HYPERTENSION, UNSPECIFIED 07/03/2008    [facility] 05/10/2015, 10:06 AM  [facility] [address] [place], [place], [address] Phone: [phone]   Fax:  [phone]  Name: [name] MRN: [id number] Date of Birth: [birth year]  Vianne Bulls, OTR/L [facility] [address] [place], [place]  [address] [phone] phone [phone] 05/10/2015 10:06 AM

## 2015-05-13 ENCOUNTER — Ambulatory Visit: Payer: Medicare Other | Admitting: Occupational Therapy

## 2015-05-13 DIAGNOSIS — R2681 Unsteadiness on feet: Secondary | ICD-10-CM | POA: Diagnosis not present

## 2015-05-13 DIAGNOSIS — R29818 Other symptoms and signs involving the nervous system: Secondary | ICD-10-CM

## 2015-05-13 DIAGNOSIS — R29898 Other symptoms and signs involving the musculoskeletal system: Secondary | ICD-10-CM | POA: Diagnosis not present

## 2015-05-13 DIAGNOSIS — R293 Abnormal posture: Secondary | ICD-10-CM | POA: Diagnosis not present

## 2015-05-13 DIAGNOSIS — R278 Other lack of coordination: Secondary | ICD-10-CM

## 2015-05-13 DIAGNOSIS — R251 Tremor, unspecified: Secondary | ICD-10-CM | POA: Diagnosis not present

## 2015-05-13 NOTE — Therapy (Signed)
[facility] [address] [place], [place], [address] Phone: [phone]   Fax:  [phone]  Occupational Therapy Treatment  Patient Details  Name: [name] MRN: [id number] Date of Birth: 05-14-1931 Referring Provider: Dr. Rexene Alberts  Encounter Date: 05/13/2015      OT End of Session - 05/13/15 0806    Visit Number 17   Number of Visits 18  10+8=18   Date for OT Re-Evaluation 05/14/15   Authorization Type Medicare, Aetna (G-code needed)   Authorization Time Period renewal completed 01/18/22 with cert. 04/20/15-06/19/15.  wk 4/4   Authorization - Visit Number 72   Authorization - Number of Visits 20   OT Start Time 0803   OT Stop Time 0845   OT Time Calculation (min) 42 min   Activity Tolerance Patient tolerated treatment well   Behavior During Therapy [facility] for tasks assessed/performed      Past Medical History  Diagnosis Date  . Hypercholesteremia   . Parkinson's disease ([place])   . Hypertension   . Sleep disorder   . Ex-smoker   . Cancer [facility])     h/o  skin  cancer  . Sleep apnea     ?? sleep apnea....tested 2013 @ [facility]     . GERD (gastroesophageal reflux disease)   . Arthritis   . Lumbar stenosis     Past Surgical History  Procedure Laterality Date  . Tonsillectomy Bilateral   . Meniscus repair      right knee  2000  . Eye surgery      bilateral  cataracts  . Appendectomy    . Back surgery  11/2013  . Colonoscopy  11/17/2008    Rourk: Sessile polyp in the cecum status post saline-assisted piecemeal polypectomy, resolution clipping and epinephrine injection therapy performed. Shallow sigmoid diverticula.. Tubular adenoma  . Colonoscopy  11/19/2008    Fields: Large amount of liquid blood with clots seen throughout the colon. Previous [place] resolution clip remained in place. Purple spot seen in the lateral aspect polypectomy site, 3 resolution clips placed here. 6 mm sessile ascending  colon polyp seen and not manipulated.  . Colonoscopy  11/25/2008    Rourk: Blood-tinged colonic effluent, suspect recurrent post polypectomy bleeding status post placement of 3 clips on the polypectomy site on top of the 3 clips that already existed, one had fallen off since last procedure. One small polyp distal to the cecum not manipulated  . Colonoscopy  12/06/2009    Rourk: Internal hemorrhoids, scattered left-sided diverticula, cecal polyps, one snared and subsequently clipped, 2 adjacent diminutive polyps ablated. tubular adenoma  . Colonoscopy N/A 01/21/2015    Procedure: COLONOSCOPY;  Surgeon: Daneil Dolin, MD;  Location[facility];  Service: Endoscopy;  Laterality: N/A;  730 - moved to 8:30 - Dr. has meeting, office talked to pt    There were no vitals filed for this visit.      Subjective Assessment - 05/13/15 0807    Subjective  "as good as expected"   Pertinent History PD diagnosis approx 5 years ago; hx of low back surgery 11/2013; hx of bilateral RTC injuries    Patient Stated Goals improving donning coat, improve fine motor skills   Currently in Pain? No/denies               Neuro Re-ed:  In standing, functional reaching in diagonal pattern incorporating trunk rotation/wt. shift and PWR! reach to place large pegs in vertical pegboard with min  cueing for PWR! hands.              OT Treatments/Exercises (OP) - 05/13/15 0001    Fine Motor Coordination   Fine Motor Coordination Flipping cards;Dealing card with thumb;Manipulating coins;In hand manipuation training   In Hand Manipulation Training Rotating relaxation balls in each hand, each direction with min difficulty R hand and min-mod difficulty with L hand.   Flipping cards with min cues for large amplitude with each hand   Dealing card with thumb with good movement amplitude each hand   Manipulating coins in each hand to stack with min difficulty   Grooved pegs Placing in with each hand with  in-hand manipulation and use of PWR! hands with min cues and difficulty.   Other Fine Motor Exercises Rotating ball with fingertips in each hand/each direction with min difficulty                  OT Short Term Goals - 05/13/15 0811    OT SHORT TERM GOAL #1   Title Pt will be independent with PD-specific HEP.--check STGs 03/23/15>Modified due to missed visit extend through 04/09/15   Time 4   Period Weeks   Status Achieved  04/12/15:  partially met, will benefit from updates; 05/13/15  met   OT SHORT TERM GOAL #2   Title Pt will verbalize understanding of ways to prevent future complications and appropriate community resources prn.   Time 4   Period Weeks   Status Achieved  04/20/15   OT SHORT TERM GOAL #3   Title Pt will improve ability to don/doff jacket as shown by improving time on PPT#4 (with gown) in 50sec or less.   Baseline 61.43sec   Time 4   Period Weeks   Status Achieved  04/12/15:  35.56sec   OT SHORT TERM GOAL #4   Title Pt will be able to write 3 sentences with 100% accuracy and no significant incr in size.   Time 4   Period Weeks   Status Achieved  approx 90% legibility with occasional min decr in size (improves when pt cued slows down); 04/12/15  min decr in size for longer words only with 95% legibility;  met 04/29/15           OT Long Term Goals - 05/10/15 0833    OT LONG TERM GOAL #1   Title Pt will verbalize understanding of strategies/AE to incr ease with ADLs prn.--check LTGs 04/23/15   Time 8   Period Weeks   Status Achieved  04/20/15 not fully met, needs reinforcement for carryover; 05/10/15   OT LONG TERM GOAL #2   Title Pt will improve bradykinesia, functional reaching, coordination for ADLs as shown by improving score on box and blocks test by at least 4 blocks bilaterally.   Baseline R-45 blocks, L-41 blocks   Time 8   Period Weeks   Status Achieved  04/20/15:  R-50blocks, L-49blocks   OT LONG TERM GOAL #3   Title Pt will improve ability to  don/doff jacket as shown by improving time on PPT#4 (with gown) in 40sec or less.   Baseline 61.43sec   Time 8   Period Weeks   Status Achieved  04/12/15:  35.56sec   OT LONG TERM GOAL #4   Title Pt will be able to button/unbutton 3 buttons on tabletop in 70sec or less for incr ease with dressing.   Baseline 95.16sec   Time 8   Period Days   Status Achieved  04/20/15:  72.97sec; 05/10/15:  97.13sec, then 53.60sec with strategies   OT LONG TERM GOAL #5   Title Pt will report incr ease with drying back, donning socks, eating, and picking up pills.   Time 8   Period Weeks   Status Achieved   OT LONG TERM GOAL #6   Title Pt will demo at least 120* bilateral shoulder flex with -20* or greater elbow extension for improved overhead reaching.   Baseline R 110* shoulder flex with -30* elbow ext;  L 110* shoulder flex with -25* elbow ext   Time 8   Status Achieved  04/20/15:  R 130* with -20* elbow ext, L 130* with -20* elbow extension   OT LONG TERM GOAL #7   Title Pt will demo at least 135* bilateral shoulder flex with -15* or greater elbow extension for improved overhead reaching.   Time 8   Status Not Met  05/10/15:  R 125* with -15*, L 125* with -10*               Plan - 2015-05-15 0809    Clinical Impression Statement Pt has made good progress and is appropriate for d/c.   Plan d/c OT; screen in approx 6 months   OT Home Exercise Plan Education issued:  seated PWR!, coordination HEP, PWR! hands (basic 4); 04/05/15  PWR! moves in modified quadraped (up, rock, twist); 04/20/15 PWR! moves (basic 4) in supine   Consulted and Agree with Plan of Care Patient      Patient will benefit from skilled therapeutic intervention in order to improve the following deficits and impairments:     Visit Diagnosis: Other symptoms and signs involving the nervous system  Other symptoms and signs involving the musculoskeletal system  Abnormal posture  Other lack of coordination      G-Codes -  15-May-2015 0847    Functional Assessment Tool Used pt reports incr ease with drying back, donning socks, eating, and picking up pills; PPT#4 35.56sec; buttoning/unbuttoning 3 buttons on tabletop 53.60sec, writing with 100% legibility and no significant decr in size   Functional Limitation Self care   Self Care Goal Status (T7322) At least 1 percent but less than 20 percent impaired, limited or restricted   Self Care Discharge Status [phone]) At least 1 percent but less than 20 percent impaired, limited or restricted      Problem List Patient Active Problem List   Diagnosis Date Noted  . History of colonic polyps   . Hx of adenomatous colonic polyps 01/04/2015  . Lumbar canal stenosis 12/04/2013  . Aortic stenosis 10/21/2013  . Hypoxemia 04/16/2012  . Primary central sleep apnea 04/16/2012  . Obstructive sleep apnea (adult) (pediatric) 04/16/2012  . Orthostatic hypotension 04/16/2012  . Sleep disturbance, unspecified 04/16/2012  . Unspecified hereditary and idiopathic peripheral neuropathy 04/16/2012  . Parkinson's 04/16/2012  . COLONIC POLYPS, ADENOMATOUS 11/19/2008  . GERD 11/19/2008  . HEMATOCHEZIA 11/19/2008  . GI BLEEDING 11/19/2008  . HYPERLIPIDEMIA-MIXED 07/03/2008  . HYPERTENSION, UNSPECIFIED 07/03/2008    OCCUPATIONAL THERAPY DISCHARGE SUMMARY  Visits from Start of Care: 17  Current functional level related to goals / functional outcomes: See above   Remaining deficits: Bradykinesia, rigidity, abnormal posture, mild decr balance for ADLs, tremors, decr coordination, shoulder ROM improved   Education / Equipment: Pt instructed in PD-specific HEP, ways to prevent future complications, community resources, strategies for ADLs.  Pt verbalized understanding of all education provided.  Plan: Patient agrees to discharge.  Patient goals were partially  met. Patient is being discharged due to                                                    reaching maximal rehab potential at  this time.  Pt would benefit from occupational therapy screen in approx 6 months to assess for need for further therapy/functional changes due to progressive nature of diagnosis  ?????       [facility] 05/13/2015, 8:57 AM  [facility] [address] [place], [place], [address] Phone: [phone]   Fax:  [phone]  Name: [name] MRN: [id number] Date of Birth: [birth year]  Vianne Bulls, OTR/L [facility] [address] [place], [place]  [address] [phone] phone [phone] 05/13/2015 8:57 AM

## 2015-05-26 DIAGNOSIS — D044 Carcinoma in situ of skin of scalp and neck: Secondary | ICD-10-CM | POA: Diagnosis not present

## 2015-05-26 DIAGNOSIS — D485 Neoplasm of uncertain behavior of skin: Secondary | ICD-10-CM | POA: Diagnosis not present

## 2015-05-26 DIAGNOSIS — D224 Melanocytic nevi of scalp and neck: Secondary | ICD-10-CM | POA: Diagnosis not present

## 2015-05-26 DIAGNOSIS — D225 Melanocytic nevi of trunk: Secondary | ICD-10-CM | POA: Diagnosis not present

## 2015-05-26 DIAGNOSIS — C44319 Basal cell carcinoma of skin of other parts of face: Secondary | ICD-10-CM | POA: Diagnosis not present

## 2015-05-26 DIAGNOSIS — L821 Other seborrheic keratosis: Secondary | ICD-10-CM | POA: Diagnosis not present

## 2015-05-26 DIAGNOSIS — L570 Actinic keratosis: Secondary | ICD-10-CM | POA: Diagnosis not present

## 2015-05-26 DIAGNOSIS — Z85828 Personal history of other malignant neoplasm of skin: Secondary | ICD-10-CM | POA: Diagnosis not present

## 2015-05-31 DIAGNOSIS — E782 Mixed hyperlipidemia: Secondary | ICD-10-CM | POA: Diagnosis not present

## 2015-05-31 DIAGNOSIS — R7301 Impaired fasting glucose: Secondary | ICD-10-CM | POA: Diagnosis not present

## 2015-06-02 DIAGNOSIS — I10 Essential (primary) hypertension: Secondary | ICD-10-CM | POA: Diagnosis not present

## 2015-06-02 DIAGNOSIS — R7301 Impaired fasting glucose: Secondary | ICD-10-CM | POA: Diagnosis not present

## 2015-06-02 DIAGNOSIS — I714 Abdominal aortic aneurysm, without rupture: Secondary | ICD-10-CM | POA: Diagnosis not present

## 2015-06-02 DIAGNOSIS — G20 Parkinson's disease: Secondary | ICD-10-CM | POA: Diagnosis not present

## 2015-06-02 DIAGNOSIS — E782 Mixed hyperlipidemia: Secondary | ICD-10-CM | POA: Diagnosis not present

## 2015-06-02 DIAGNOSIS — R05 Cough: Secondary | ICD-10-CM | POA: Diagnosis not present

## 2015-06-24 ENCOUNTER — Encounter: Payer: Self-pay | Admitting: Internal Medicine

## 2015-06-24 ENCOUNTER — Telehealth: Payer: Self-pay

## 2015-06-24 NOTE — Telephone Encounter (Signed)
I spoke to wife about appt on Monday. She is aware to bring in SD card from PAP machine.

## 2015-06-28 ENCOUNTER — Ambulatory Visit (INDEPENDENT_AMBULATORY_CARE_PROVIDER_SITE_OTHER): Payer: Medicare Other | Admitting: Neurology

## 2015-06-28 ENCOUNTER — Encounter: Payer: Self-pay | Admitting: Neurology

## 2015-06-28 VITALS — BP 124/58 | HR 70 | Resp 16 | Ht 71.0 in | Wt 190.0 lb

## 2015-06-28 DIAGNOSIS — G20 Parkinson's disease: Secondary | ICD-10-CM

## 2015-06-28 DIAGNOSIS — G4733 Obstructive sleep apnea (adult) (pediatric): Secondary | ICD-10-CM

## 2015-06-28 DIAGNOSIS — G4731 Primary central sleep apnea: Secondary | ICD-10-CM

## 2015-06-28 NOTE — Patient Instructions (Signed)
We will continue with your medications, the Sinemet and the mirapex. We will have to monitor your swelling of the legs and may need to reduce or phase out your Mirapex.

## 2015-06-28 NOTE — Progress Notes (Signed)
Subjective:    Patient ID: [name] is a 82 y.o. male.  HPI     Interim history:  [name] is a very pleasant 82 year old right-handed gentleman with an underlying medical history of hypertension, hyperlipidemia, history of cancer, ex-smoker, and complex sleep apnea on BiPAP ST, who presents for followup consultation off his left-sided predominant Parkinson's disease as well as his primary central sleep apnea. He is accompanied by his wife again today. I last saw him on 12/28/2014, at which time he reported doing fairly well, no recent falls, tremor somewhat worse and dexterity worse. He had noted some trouble with his turns at times. He was on Sinemet 4 times a day, usually at 6 AM, 10 AM, 2 PM and 8 PM. He had constipation and was on a stool softener and prunes. He was to have a colonoscopy on 01/04/2015.   Today, 06/28/2015: I reviewed his BiPAP ST compliance data from 05/29/2015 through 06/27/2015, which is a total of 30 days during which time he used his machine every night with percent used days greater than 4 hours at 97%, indicating excellent compliance with an average usage of 5 hours and 16 minutes, residual AHI 1.5 per hour, leak acceptable with the 95th percentile at 12.4 L/m on a pressure of 13/9 cm with a rate of 10.  Today, 06/28/2015: He reports Doing okay, tremor perhaps worse. Has not noticed much in the way of change after we increase the Sinemet last time. Constipation is under control with as needed MiraLAX. He does not walk very much, uses his stationary bike at times. He does enjoy gardening some. He has not fallen. He does not typically use a cane or a walker.  Previously:   I saw him on 07/09/2014, at whicht time he reported feeling stable for the most part. He had not experienced any drastic changes in his symptoms. He reported no falls. He had no significant low back pain and no longer was taking any narcotics. Unfortunately, his wife was involved in a car  accident and sustained broken ribs and had to be in the ICU for some time. He was using his BiPAP machine regularly. He reported one skip night because of a stomach bug. Overall, he felt he was sleeping well. He felt that the increase in Sinemet helped his trembling. He was tolerating his medication. He had no new major mood or memory issues.   I reviewed his BiPAP compliance data from 11/23/2014 through 12/22/2014 which is a total of 30 days during which time he used his machine every night with percent used days greater than 4 hours at 93%, indicating excellent compliance with an average usage of 4 hours and 54 minutes, residual AHI low at 1.7 per hour, leaked low with the 95th percentile at 7.4 L/m on a pressure of 13/9 cm with a backup rate of 10.   I saw him on 03/03/2014, at which time he reported experiencing thick mucus first thing in the morning. His tremor was worse per wife. He received new supplies recently. He has some thick mucus first thing in the morning. His tremor has become worse per wife. His back surgery in November 2015 had relieved much of his pain. He was no longer on pain medication. He was taking stool softeners for his constipation. I suggested he continue with Mirapex 3 times a day but I did ask him to increase Sinemet to 4 times a day.  I reviewed his BiPAP compliance data from 06/07/2014 through  07/06/2014 which is a total of 30 days during which time he used his machine 29 days with percent used days greater than 4 hours at 87%, indicating very good compliance with an average usage of 5 hours and 2 minutes, residual AHI low at 1.6 per hour, leak low with the 95th percentile at 7.6 L/m on a pressure of 13/9 with a rate of 10.  I saw him on 12/02/2013, at which time he was compliant with BiPAP ST. He had spine surgery under Dr. Cyndy Freeze on 12/04/2013 which went well. He is in physical therapy. He had an L spine MRI on 11/06/13: Degenerative lumbar spondylosis with multilevel disc  disease and facet disease. There is bilateral lateral recess and bilateral foraminal stenosis at L2-3 and L3-4. The most significant level however is L4-5 with there is severe spinal, bilateral lateral recess and foraminal stenosis. We talked about his fluid intake. He was still not consistent with his dose timings for his Sinemet and Mirapex.  I reviewed his compliance data from 01/27/2014 through 02/25/2014 which is a total of 30 days during which time he used his machine every night with percent used days greater than 4 hours of 87%, indicating very good compliance with an average usage of 5 hours and 6 minutes, residual AHI of 3.5 per hour and leak acceptable with the 95th percentile at 15 L/m.  I saw him on 05/27/2013, at which time he reported being compliant with his BiPAP, averaging 4-5 hours each night. He was still taking his Sinemet with his meals on most days. He felt his tremor was a little worse. His wife felt that he was otherwise stable. He denied any new cognitive issues, depression, hallucinations, anxiety, delusions. He was drinking 3 cups of coffee in the morning and not enough water. I did not increase his medication but asked him to take his Sinemet away from his mealtimes. I considered Linzess for chronic constipation but asked him to increase his water intake and watch constipation symptoms before we use medications.  I reviewed his compliance data from 10/31/2013 through 11/29/2013 which is a total of 30 days during which time he used his machine every night except for 1 night. Percent used days greater than 4 hours was 90% indicating excellent compliance. Residual AHI at 2.5 per hour, leaked low. Pressure at 13/9 with a rate of 10.  I saw him on 11/27/2012, at which time I did not change his medication regimen with the exception of the timing for his Sinemet and Mirapex: I advised him to take it at 7 AM, 11 AM and 4 PM. I also asked him to continue using his BiPAP regularly and  take it with him on any vacation trips. He was congratulated on his compliance.  I saw him on 07/26/2012 after had his sleep study. He has been compliant on BiPAP therapy. I had increased his Sinemet to one pill 3 times a day. He was taking it with his meals and did not note any significant improvement in his symptoms, but, then again, he was taking it right after his meals. He was asking whether he should take his BiPAP machine with him to his planned trip to [place].    I first met him on 02/19/2012 after his baseline sleep study. His total AHI was 39.5 per hour based primarily on central apneas. His obstructive AHI was around 15 per hour. His oxyhemoglobin desaturation nadir was 85% and he spent 3 hours and 31 minutes below the saturation of  90% for the night. I asked him to come back for a CPAP titration study, possibly BiPAP but he wanted to hold off until his appointment in April as he was going to be out of town and he also wanted to bring his wife for discussion.   I then saw him back on 04/17/2012 and again went over his test results with him and his wife. He had been doing well from the PD standpoint. He agreed to come back for another sleep study with full night titration. His sleep titration study was on 05/14/2012 and I went over his test results with him and his wife in detail during our visit in July. Sleep efficiency was reduced at 71.5% with a latency to sleep of 2 minutes. Wake after sleep onset was highly elevated at 124 minutes with mild to moderate sleep fragmentation noted. He had increased percentage of REM sleep at 27.2%. He had a normal REM latency. There were no significant aortic leg movements. He was started on CPAP at a pressure of 5 cm and titrated up to 9 cm of water pressure but he had significant central apneas and therefore changed to BiPAP at 11/7 and then switch to ST mode for ongoing central events. His final pressure was 13/9 with a backup rate of 10 on which she had a  residual AHI of 0 per hour and supine REM sleep achieved. Oxyhemoglobin desaturation nadir on the final pressure was 90%.   He was placed on BiPAP ST at 13/9 cm with a backup rate of 10. I reviewed his compliance from 06/26/2012 through 07/25/2012 (29 days), during which time he used it every day. His average usage was 5 hours and 6 minutes and percent used days greater than 4 hours was 96% indicating excellent compliance. His residual AHI was around 6 indicating fairly reasonable pressure settings. He reported tolerating the treatment and he changed from a FFM to a nasal mask. He denied depression, memory loss, lightheadedness, or hallucinations. I also reviewed more recent compliance data from 09/29/2012 through 10/28/2012 (30 days), during which time he used his machine every day. His average usage was 5 hours and 23 minutes, his percent used days greater than 4 hours was 29 days, which is 97%, indicating excellent compliance. His residual AHI was 2.6 per hour indicating an appropriate treatment setting of 13/9 cm with a backup rate of 10 per minute.   His Past Medical History Is Significant For: Past Medical History  Diagnosis Date  . Hypercholesteremia   . Parkinson's disease (HCC)   . Hypertension   . Sleep disorder   . Ex-smoker   . Cancer [facility])     h/o  skin  cancer  . Sleep apnea     ?? sleep apnea....tested 2013 @ [facility]     . GERD (gastroesophageal reflux disease)   . Arthritis   . Lumbar stenosis     Her Past Surgical History Is Significant For: Past Surgical History  Procedure Laterality Date  . Tonsillectomy Bilateral   . Meniscus repair      right knee  2000  . Eye surgery      bilateral  cataracts  . Appendectomy    . Back surgery  11/2013  . Colonoscopy  11/17/2008    Rourk: Sessile polyp in the cecum status post saline-assisted piecemeal polypectomy, resolution clipping and epinephrine injection therapy performed. Shallow sigmoid diverticula.. Tubular  adenoma  . Colonoscopy  11/19/2008    Fields: Large amount of liquid blood  with clots seen throughout the colon. Previous [place] resolution clip remained in place. Purple spot seen in the lateral aspect polypectomy site, 3 resolution clips placed here. 6 mm sessile ascending colon polyp seen and not manipulated.  . Colonoscopy  11/25/2008    Rourk: Blood-tinged colonic effluent, suspect recurrent post polypectomy bleeding status post placement of 3 clips on the polypectomy site on top of the 3 clips that already existed, one had fallen off since last procedure. One small polyp distal to the cecum not manipulated  . Colonoscopy  12/06/2009    Rourk: Internal hemorrhoids, scattered left-sided diverticula, cecal polyps, one snared and subsequently clipped, 2 adjacent diminutive polyps ablated. tubular adenoma  . Colonoscopy N/A 01/21/2015    Procedure: COLONOSCOPY;  Surgeon: Daneil Dolin, MD;  Location: [facility];  Service: Endoscopy;  Laterality: N/A;  730 - moved to 8:30 - Dr. has meeting, office talked to pt    Her Family History Is Significant For: Family History  Problem Relation Age of Onset  . Dementia Mother   . Cancer Brother   . Stroke Brother     His Social History Is Significant For: Social History   Social History  . Marital Status: Married    Spouse Name: Clinical research associate  . Number of Children: 4  . Years of Education: 16   Occupational History  . Retired     Social History Main Topics  . Smoking status: Former Smoker -- 1.00 packs/day    Types: Cigarettes, Pipe    Start date: 01/22/1951    Quit date: 01/21/1989  . Smokeless tobacco: Never Used  . Alcohol Use: No  . Drug Use: No  . Sexual Activity: Not Asked   Other Topics Concern  . None   Social History Narrative    His Allergies Are:  No Known Allergies:   His Current Medications Are:  Outpatient Encounter Prescriptions as of 06/28/2015  Medication Sig  . amLODipine (NORVASC) 10 MG tablet Take 10 mg by  mouth daily.  . calcium gluconate 500 MG tablet Take 500 mg by mouth daily.  . carbidopa-levodopa (SINEMET IR) 25-100 MG tablet Take 1 pill 5 times a day: 6, 10, 2PM, 6PM, and 9 PM  . Docusate Calcium (STOOL SOFTENER PO) Take by mouth 2 (two) times daily.  Marland Kitchen glucosamine-chondroitin 500-400 MG tablet Take 1 tablet by mouth once.  Marland Kitchen ketoconazole (NIZORAL) 2 % cream Apply 1 application topically daily as needed for irritation.   . Multiple Vitamin (MULTIVITAMIN) tablet Take 1 tablet by mouth daily.  Marland Kitchen omeprazole (PRILOSEC OTC) 20 MG tablet Take 20 mg by mouth daily.  . pramipexole (MIRAPEX) 0.5 MG tablet Take 1 tablet (0.5 mg total) by mouth 3 (three) times daily.  Marland Kitchen telmisartan-hydrochlorothiazide (MICARDIS HCT) 80-25 MG per tablet Take 1 tablet by mouth daily.  . valsartan-hydrochlorothiazide (DIOVAN-HCT) 80-12.5 MG tablet   . vitamin C (ASCORBIC ACID) 500 MG tablet Take 500 mg by mouth daily.  . [DISCONTINUED] polyethylene glycol-electrolytes (TRILYTE) 420 G solution Take 4,000 mLs by mouth as directed.   No facility-administered encounter medications on file as of 06/28/2015.  :  Review of Systems:  Out of a complete 14 point review of systems, all are reviewed and negative with the exception of these symptoms as listed below:   Review of Systems  Neurological:       Patient states that he is having some trouble with his shaking. Reports that he is doing well on CPAP.     Objective:  Neurologic Exam  Physical Exam Physical Examination:   Filed Vitals:   06/28/15 1158  BP: 124/58  Pulse: 70  Resp: 16    General Examination: The patient is a very pleasant 82 y.o. male in no acute distress.  HEENT exam: He has a moderate degree of nuchal rigidity with a mild lower jaw and lip tremor, which is fairly constant. He has a mild to moderately masked facies with decreased eye blink rate. Hearing is mildly impaired. Funduscopic exam is normal bilaterally. He is status post cataract  repairs bilaterally. He has prescription eyeglasses. Speech is moderately hypophonic, but not dysarthric and minimal drooling is noted, stable. Pupils are equal, round and reactive to light. On extraocular tracking he has mild saccadic breakdown especially on downward gaze. He has no carotid bruits. Chest is clear to auscultation without wheezing or rhonchi noted. Heart sounds are normal with a slight systolic heart murmur (2/6), no rubs or gallops noted, unchanged. Abdomen is soft, nontender with normal bowel sounds appreciated. There is 1+ pitting edema in the distal lower extremities bilaterally, has become worse.   No joint deformities are noted. Skin is warm and dry but he does have several old appearing bruises across the dorsi of his hands. Neurologically: Mental status: The patient is awake, alert and oriented in all 3 spheres. His memory, attention, language and knowledge are appropriate. Cranial nerves are as described under HEENT exam. In addition airway exam reveals a moderately tight airway secondary to a narrow airway entry. Tongue protrudes centrally and palate elevates symmetrically. Motor exam: Normal bulk and strength is noted. Tone is increased with some cogwheeling noted in both upper extremities, L more than R. Tone is also increased in the left lower extremity. Tone is mildly increased on the right side. He has a moderate constant resting tremor in the left upper extremity. He has an intermittent mild resting tremor on the right upper extremity. Fine motor skills with finger taps and hand movements as well as rapid alternating patting is moderately to severely impaired on the left and moderately so on the right. Foot taps are moderately impaired on the right and moderately to severely so on the left. He stands up from the seated position with mild problems and his posture is moderately stooped. He walks with decreased arm swing bilaterally, L more than right. He has a fairly good stride  length but decreased pace and turns in 3 steps. His balance is fairly well preserved, but he did have mild insecurity with turns, unchanged. Romberg is negative. Reflexes are 1+ in the UEs and absent in the LEs. Sensory exam is unchanged from before and normal to all modalities tested. Cerebellar testing shows no dysmetria or intention tremor on finger to nose testing.    Assessment and Plan:   In summary, [name] is a very pleasant 81 year old male with an underlying medical history of hypertension, hyperlipidemia, history of cancer, ex-smoker, and complex sleep apnea on BiPAP ST, who presents for followup consultation off his left-sided predominant Parkinson's disease as well as his sleep apnea. His history is complicated by chronic constipation, severe mixed sleep apnea which is treated well with BiPAP therapy and chronic back pain for which he had surgery on 12/04/2013 with good success. He is tolerating the Sinemet which is currently at 1 pill 5 times a day, but the increase in dose last time did not make a big difference per his report. He has developed lower extremity swelling which in my examination  is worse from last time. We need to monitor this. We need to consider reducing Mirapex if the swelling is persistent or gets worse. I talked to the patient and his wife about this today. In addition, he is advised to try over-the-counter compression socks.  For now, we will continue with Sinemet 1 pill 5 times a day, at 6, 10, 2 PM, 6 PM and 9 PM and Mirapex 3 times a day, he is advised to take at 6, 2 and 6. He is advised to continue to be compliant with BiPAP therapy and is commended for his excellent compliance in that regard.  He did not need refills today. I would like to see him back in 4 months, sooner if needed. I answered all their questions today and the patient and his wife were in agreement. They are encouraged to call with any interim questions or concerns.  I spent 25 minutes in  total face-to-face time with the patient, more than 50% of which was spent in counseling and coordination of care, reviewing test results, reviewing medication and discussing or reviewing the diagnosis of PD and sleep apnea, the prognosis and treatment options.

## 2015-07-05 ENCOUNTER — Ambulatory Visit (HOSPITAL_COMMUNITY)
Admission: RE | Admit: 2015-07-05 | Discharge: 2015-07-05 | Disposition: A | Discharge status: Home / Self Care | Payer: Medicare Other | Source: Ambulatory Visit | Attending: Cardiovascular Disease | Admitting: Cardiovascular Disease

## 2015-07-05 DIAGNOSIS — I119 Hypertensive heart disease without heart failure: Secondary | ICD-10-CM | POA: Insufficient documentation

## 2015-07-05 DIAGNOSIS — K219 Gastro-esophageal reflux disease without esophagitis: Secondary | ICD-10-CM | POA: Insufficient documentation

## 2015-07-05 DIAGNOSIS — I340 Nonrheumatic mitral (valve) insufficiency: Secondary | ICD-10-CM | POA: Insufficient documentation

## 2015-07-05 DIAGNOSIS — G4733 Obstructive sleep apnea (adult) (pediatric): Secondary | ICD-10-CM | POA: Insufficient documentation

## 2015-07-05 DIAGNOSIS — I350 Nonrheumatic aortic (valve) stenosis: Secondary | ICD-10-CM

## 2015-07-05 DIAGNOSIS — I77810 Thoracic aortic ectasia: Secondary | ICD-10-CM | POA: Diagnosis not present

## 2015-07-05 DIAGNOSIS — E785 Hyperlipidemia, unspecified: Secondary | ICD-10-CM | POA: Insufficient documentation

## 2015-07-05 LAB — ECHOCARDIOGRAM COMPLETE
AO mean calculated velocity dopler: 233 cm/s
AOVTI: 73.4 cm
AV Area VTI: 1.33 cm2
AV Area mean vel: 1.42 cm2
AV Mean grad: 26 mmHg
AV area mean vel ind: 0.69 cm2/m2
AV vel: 1.46
AVA: 1.46 cm2
AVAREAVTIIND: 0.71 cm2/m2
AVCELMEANRAT: 0.27
AVLVOTPG: 3 mmHg
AVPG: 45 mmHg
AVPKVEL: 335 cm/s
Ao pk vel: 0.25 m/s
CHL CUP AV PEAK INDEX: 0.65
CHL CUP AV VALUE AREA INDEX: 0.71
CHL CUP DOP CALC LVOT VTI: 20.2 cm
EERAT: 10.42
EWDT: 259 ms
FS: 38 % (ref 28–44)
IV/PV OW: 1.06
LA diam end sys: 42 mm
LA diam index: 2.04 cm/m2
LA vol index: 24.5 mL/m2
LASIZE: 42 mm
LAVOL: 50.5 mL
LAVOLA4C: 48.9 mL
LDCA: 5.31 cm2
LV E/e' medial: 10.42
LV PW d: 11.6 mm — AB (ref 0.6–1.1)
LV dias vol: 79 mL (ref 62–150)
LV sys vol index: 15 mL/m2
LV sys vol: 31 mL (ref 21–61)
LVDIAVOLIN: 38 mL/m2
LVEEAVG: 10.42
LVELAT: 6.74 cm/s
LVOT SV: 107 mL
LVOT diameter: 26 mm
LVOT peak VTI: 0.28 cm
LVOT peak vel: 83.6 cm/s
MV Dec: 259
MV pk E vel: 70.2 m/s
MVPKAVEL: 104 m/s
Simpson's disk: 61
Stroke v: 48 ml
TAPSE: 27.8 mm
TDI e' lateral: 6.74
TDI e' medial: 6.53

## 2015-07-15 DIAGNOSIS — D485 Neoplasm of uncertain behavior of skin: Secondary | ICD-10-CM | POA: Diagnosis not present

## 2015-07-15 DIAGNOSIS — C4441 Basal cell carcinoma of skin of scalp and neck: Secondary | ICD-10-CM | POA: Diagnosis not present

## 2015-07-15 DIAGNOSIS — L821 Other seborrheic keratosis: Secondary | ICD-10-CM | POA: Diagnosis not present

## 2015-07-15 DIAGNOSIS — C44319 Basal cell carcinoma of skin of other parts of face: Secondary | ICD-10-CM | POA: Diagnosis not present

## 2015-07-15 DIAGNOSIS — L812 Freckles: Secondary | ICD-10-CM | POA: Diagnosis not present

## 2015-07-15 DIAGNOSIS — Z85828 Personal history of other malignant neoplasm of skin: Secondary | ICD-10-CM | POA: Diagnosis not present

## 2015-07-15 DIAGNOSIS — C44311 Basal cell carcinoma of skin of nose: Secondary | ICD-10-CM | POA: Diagnosis not present

## 2015-07-15 DIAGNOSIS — D692 Other nonthrombocytopenic purpura: Secondary | ICD-10-CM | POA: Diagnosis not present

## 2015-07-15 DIAGNOSIS — L570 Actinic keratosis: Secondary | ICD-10-CM | POA: Diagnosis not present

## 2015-07-16 DIAGNOSIS — H43811 Vitreous degeneration, right eye: Secondary | ICD-10-CM | POA: Diagnosis not present

## 2015-08-10 ENCOUNTER — Encounter: Payer: Self-pay | Admitting: Gastroenterology

## 2015-08-10 ENCOUNTER — Other Ambulatory Visit: Payer: Self-pay

## 2015-08-10 ENCOUNTER — Ambulatory Visit (INDEPENDENT_AMBULATORY_CARE_PROVIDER_SITE_OTHER): Payer: Medicare Other | Admitting: Gastroenterology

## 2015-08-10 VITALS — BP 121/76 | HR 83 | Temp 97.8°F | Ht 71.0 in | Wt 189.6 lb

## 2015-08-10 DIAGNOSIS — Z86010 Personal history of colonic polyps: Secondary | ICD-10-CM

## 2015-08-10 DIAGNOSIS — D126 Benign neoplasm of colon, unspecified: Secondary | ICD-10-CM | POA: Diagnosis not present

## 2015-08-10 MED ORDER — PEG 3350-KCL-NA BICARB-NACL 420 G PO SOLR: 4000.0000 mL | ORAL | 0 refills | Status: DC

## 2015-08-10 MED ORDER — PEG-KCL-NACL-NASULF-NA ASC-C 100 G PO SOLR: 1.0000 | ORAL | 0 refills | Status: DC

## 2015-08-10 NOTE — Assessment & Plan Note (Signed)
82 year old gentleman with history of adenomatous colon polyps who presents for 6 month surveillance colonoscopy due to a rather large polyp in the right side of the colon removed in a piecemeal fashion. Patient doing well from a GI standpoint.  I have discussed the risks, alternatives, benefits with regards to but not limited to the risk of reaction to medication, bleeding, infection, perforation and the patient is agreeable to proceed. Written consent to be obtained.

## 2015-08-10 NOTE — Progress Notes (Signed)
cc'ed to pcp °

## 2015-08-10 NOTE — Patient Instructions (Signed)
1. Colonoscopy as scheduled. See separate instructions.  

## 2015-08-10 NOTE — Progress Notes (Signed)
Primary Care Physician:  Wende Neighbors, MD  Primary Gastroenterologist:  Garfield Cornea, MD   Chief Complaint  Patient presents with  . Colonoscopy    HPI:  [name] is a 82 y.o. male here to schedule 6 month surveillance colonoscopy. He had a rather large polyp in the right side of the colon removed in piecemeal fashion. Clinically he is doing well. Denies melena, rectal bleeding. Constipation well managed with when necessary MiraLAX. No abdominal pain. Heartburn well-controlled. No GI complaints.  Current Outpatient Prescriptions  Medication Sig Dispense Refill  . amLODipine (NORVASC) 10 MG tablet Take 10 mg by mouth daily.    . calcium gluconate 500 MG tablet Take 500 mg by mouth daily.    . carbidopa-levodopa (SINEMET IR) 25-100 MG tablet Take 1 pill 5 times a day: 6, 10, 2PM, 6PM, and 9 PM 450 tablet 3  . glucosamine-chondroitin 500-400 MG tablet Take 1 tablet by mouth once.    . Multiple Vitamin (MULTIVITAMIN) tablet Take 1 tablet by mouth daily.    Marland Kitchen omeprazole (PRILOSEC OTC) 20 MG tablet Take 20 mg by mouth daily.    . pramipexole (MIRAPEX) 0.5 MG tablet Take 1 tablet (0.5 mg total) by mouth 3 (three) times daily. 270 tablet 3  . telmisartan-hydrochlorothiazide (MICARDIS HCT) 80-25 MG per tablet Take 1 tablet by mouth daily.    . valsartan-hydrochlorothiazide (DIOVAN-HCT) 80-12.5 MG tablet     . vitamin C (ASCORBIC ACID) 500 MG tablet Take 500 mg by mouth daily.     No current facility-administered medications for this visit.     Allergies as of 08/10/2015  . (No Known Allergies)    Past Medical History:  Diagnosis Date  . Arthritis   . Cancer [facility])    h/o  skin  cancer  . Ex-smoker   . GERD (gastroesophageal reflux disease)   . Hypercholesteremia   . Hypertension   . Lumbar stenosis   . Parkinson's disease ([place])   . Sleep apnea    ?? sleep apnea....tested 2013 @ [facility]     . Sleep disorder     Past Surgical History:  Procedure Laterality  Date  . APPENDECTOMY    . BACK SURGERY  11/2013  . COLONOSCOPY  11/17/2008   Rourk: Sessile polyp in the cecum status post saline-assisted piecemeal polypectomy, resolution clipping and epinephrine injection therapy performed. Shallow sigmoid diverticula.. Tubular adenoma  . COLONOSCOPY  11/19/2008   Fields: Large amount of liquid blood with clots seen throughout the colon. Previous Boston resolution clip remained in place. Purple spot seen in the lateral aspect polypectomy site, 3 resolution clips placed here. 6 mm sessile ascending colon polyp seen and not manipulated.  . COLONOSCOPY  11/25/2008   Rourk: Blood-tinged colonic effluent, suspect recurrent post polypectomy bleeding status post placement of 3 clips on the polypectomy site on top of the 3 clips that already existed, one had fallen off since last procedure. One small polyp distal to the cecum not manipulated  . COLONOSCOPY  12/06/2009   Rourk: Internal hemorrhoids, scattered left-sided diverticula, cecal polyps, one snared and subsequently clipped, 2 adjacent diminutive polyps ablated. tubular adenoma  . COLONOSCOPY N/A 01/21/2015   Dr. Gala Romney: 1.5 cm carpet-type polyp just distal to the ileocecal valve removed piecemeal fashion and ablated. Small polyp in the rectum. Pathology-tubular adenomas  . EYE SURGERY     bilateral  cataracts  . MENISCUS REPAIR     right knee  2000  . TONSILLECTOMY Bilateral  Family History  Problem Relation Age of Onset  . Dementia Mother   . Cancer Brother   . Stroke Brother     Social History   Social History  . Marital status: Married    Spouse name: Clinical research associate  . Number of children: 4  . Years of education: 1   Occupational History  . Retired     Social History Main Topics  . Smoking status: Former Smoker    Packs/day: 1.00    Types: Cigarettes, Pipe    Start date: 01/22/1951    Quit date: 01/21/1989  . Smokeless tobacco: Never Used  . Alcohol use No  . Drug use: No  . Sexual  activity: Not on file   Other Topics Concern  . Not on file   Social History Narrative  . No narrative on file      ROS:  General: Negative for anorexia,  fever, chills, fatigue, weakness. Weight down 10 pounds in the past 8 months.  Eyes: Negative for vision changes.  ENT: Negative for hoarseness, difficulty swallowing , nasal congestion. CV: Negative for chest pain, angina, palpitations, dyspnea on exertion, peripheral edema.  Respiratory: Negative for dyspnea at rest, dyspnea on exertion, cough, sputum, wheezing.  GI: See history of present illness. GU:  Negative for dysuria, hematuria, urinary incontinence, urinary frequency, nocturnal urination.  MS: Negative for joint pain, low back pain.  Derm: Negative for rash or itching.  Neuro: Negative for weakness, abnormal sensation, seizure, frequent headaches, memory loss, confusion.+tremor  Psych: Negative for anxiety, depression, suicidal ideation, hallucinations.  Endo: Negative for unusual weight change.  Heme: Negative for bruising or bleeding. Allergy: Negative for rash or hives.    Physical Examination:  BP 121/76   Pulse 83   Temp 97.8 F (36.6 C) (Oral)   Ht 5\' 11"  (1.803 m)   Wt 189 lb 9.6 oz (86 kg)   BMI 26.44 kg/m    General: Well-nourished, well-developed in no acute distress.  Head: Normocephalic, atraumatic.   Eyes: Conjunctiva pink, no icterus. Mouth: Oropharyngeal mucosa moist and pink , no lesions erythema or exudate. Neck: Supple without thyromegaly, masses, or lymphadenopathy.  Lungs: Clear to auscultation bilaterally.  Heart: Regular rate and rhythm, no murmurs rubs or gallops.  Abdomen: Bowel sounds are normal, nontender, nondistended, no hepatosplenomegaly or masses, no abdominal bruits or    hernia , no rebound or guarding.   Rectal: deferred Extremities: No lower extremity edema. No clubbing or deformities.  Neuro: Alert and oriented x 4 , grossly normal neurologically. +tremors, left  hand Skin: Warm and dry, no rash or jaundice.   Psych: Alert and cooperative, normal mood and affect.

## 2015-08-13 DIAGNOSIS — H401131 Primary open-angle glaucoma, bilateral, mild stage: Secondary | ICD-10-CM | POA: Diagnosis not present

## 2015-08-20 DIAGNOSIS — H401131 Primary open-angle glaucoma, bilateral, mild stage: Secondary | ICD-10-CM | POA: Diagnosis not present

## 2015-09-01 ENCOUNTER — Encounter (HOSPITAL_COMMUNITY)
Admission: RE | Disposition: A | Discharge status: Home / Self Care | Payer: Self-pay | Source: Ambulatory Visit | Attending: Internal Medicine

## 2015-09-01 ENCOUNTER — Encounter (HOSPITAL_COMMUNITY): Payer: Self-pay | Admitting: *Deleted

## 2015-09-01 ENCOUNTER — Ambulatory Visit (HOSPITAL_COMMUNITY)
Admission: RE | Admit: 2015-09-01 | Discharge: 2015-09-01 | Disposition: A | Discharge status: Home / Self Care | Payer: Medicare Other | Source: Ambulatory Visit | Attending: Internal Medicine | Admitting: Internal Medicine

## 2015-09-01 DIAGNOSIS — Z8719 Personal history of other diseases of the digestive system: Secondary | ICD-10-CM | POA: Diagnosis not present

## 2015-09-01 DIAGNOSIS — M1990 Unspecified osteoarthritis, unspecified site: Secondary | ICD-10-CM | POA: Diagnosis not present

## 2015-09-01 DIAGNOSIS — Z86010 Personal history of colonic polyps: Secondary | ICD-10-CM | POA: Diagnosis not present

## 2015-09-01 DIAGNOSIS — M4806 Spinal stenosis, lumbar region: Secondary | ICD-10-CM | POA: Insufficient documentation

## 2015-09-01 DIAGNOSIS — I10 Essential (primary) hypertension: Secondary | ICD-10-CM | POA: Insufficient documentation

## 2015-09-01 DIAGNOSIS — G20 Parkinson's disease: Secondary | ICD-10-CM | POA: Insufficient documentation

## 2015-09-01 DIAGNOSIS — Z79899 Other long term (current) drug therapy: Secondary | ICD-10-CM | POA: Diagnosis not present

## 2015-09-01 DIAGNOSIS — Q438 Other specified congenital malformations of intestine: Secondary | ICD-10-CM | POA: Diagnosis not present

## 2015-09-01 DIAGNOSIS — E7800 Pure hypercholesterolemia, unspecified: Secondary | ICD-10-CM | POA: Insufficient documentation

## 2015-09-01 DIAGNOSIS — Z85828 Personal history of other malignant neoplasm of skin: Secondary | ICD-10-CM | POA: Diagnosis not present

## 2015-09-01 DIAGNOSIS — K5900 Constipation, unspecified: Secondary | ICD-10-CM | POA: Insufficient documentation

## 2015-09-01 DIAGNOSIS — G479 Sleep disorder, unspecified: Secondary | ICD-10-CM | POA: Insufficient documentation

## 2015-09-01 DIAGNOSIS — Z87891 Personal history of nicotine dependence: Secondary | ICD-10-CM | POA: Insufficient documentation

## 2015-09-01 DIAGNOSIS — K219 Gastro-esophageal reflux disease without esophagitis: Secondary | ICD-10-CM | POA: Diagnosis not present

## 2015-09-01 DIAGNOSIS — D122 Benign neoplasm of ascending colon: Secondary | ICD-10-CM | POA: Insufficient documentation

## 2015-09-01 HISTORY — PX: COLONOSCOPY: SHX5424

## 2015-09-01 HISTORY — PX: POLYPECTOMY: SHX5525

## 2015-09-01 SURGERY — COLONOSCOPY
Anesthesia: Moderate Sedation

## 2015-09-01 MED ORDER — ONDANSETRON HCL 4 MG/2ML IJ SOLN
INTRAMUSCULAR | Status: DC | PRN
  Administered 2015-09-01: 4 mg via INTRAVENOUS

## 2015-09-01 MED ORDER — STERILE WATER FOR IRRIGATION IR SOLN
Status: DC | PRN
  Administered 2015-09-01: 09:00:00

## 2015-09-01 MED ORDER — MIDAZOLAM HCL 5 MG/5ML IJ SOLN
INTRAMUSCULAR | Status: AC
  Filled 2015-09-01: qty 10

## 2015-09-01 MED ORDER — SODIUM CHLORIDE 0.9 % IV SOLN
INTRAVENOUS | Status: DC
  Administered 2015-09-01: 08:00:00 via INTRAVENOUS

## 2015-09-01 MED ORDER — MEPERIDINE HCL 100 MG/ML IJ SOLN
INTRAMUSCULAR | Status: AC
  Filled 2015-09-01: qty 2

## 2015-09-01 MED ORDER — MIDAZOLAM HCL 5 MG/5ML IJ SOLN
INTRAMUSCULAR | Status: DC | PRN
  Administered 2015-09-01: 1 mg via INTRAVENOUS
  Administered 2015-09-01: 2 mg via INTRAVENOUS

## 2015-09-01 MED ORDER — ONDANSETRON HCL 4 MG/2ML IJ SOLN
INTRAMUSCULAR | Status: AC
  Filled 2015-09-01: qty 2

## 2015-09-01 MED ORDER — MEPERIDINE HCL 100 MG/ML IJ SOLN
INTRAMUSCULAR | Status: DC | PRN
  Administered 2015-09-01: 25 mg via INTRAVENOUS

## 2015-09-01 NOTE — Interval H&P Note (Signed)
History and Physical Interval Note:  09/01/2015 8:39 AM  [name]  has presented today for surgery, with the diagnosis of HISTORY OF POLYPS  The various methods of treatment have been discussed with the patient and family. After consideration of risks, benefits and other options for treatment, the patient has consented to  Procedure(s) with comments: COLONOSCOPY (N/A) - 845 as a surgical intervention .  The patient's history has been reviewed, patient examined, no change in status, stable for surgery.  I have reviewed the patient's chart and labs.  Questions were answered to the patient's satisfaction.    Surveillance colonoscopy per plan. No change.  The risks, benefits, limitations, alternatives and imponderables have been reviewed with the patient. Questions have been answered. All parties are agreeable.    Manus Rudd

## 2015-09-01 NOTE — Op Note (Signed)
[facility] Patient Name: [name] Procedure Date: 09/01/2015 8:28 AM MRN: [id number] Date of Birth: 1931/01/19 Attending MD: Norvel Richards , MD CSN: [id number] Age: 82 Admit Type: Outpatient Procedure:                Ileo-colonoscopy with snare polypectomy Indications:              Surveillance: Piecemeal removal of large sessile                            adenoma last colonoscopy (< 3 yrs) Providers:                Norvel Richards, MD, Otis Peak B. Sharon Seller, RN,                            Isabella Stalling, Technician Referring MD:              Medicines:                Midazolam 3 mg IV, Meperidine 25 mg IV Complications:            No immediate complications. Estimated Blood Loss:     Estimated blood loss was minimal. Procedure:                Pre-Anesthesia Assessment:                           - Prior to the procedure, a History and Physical                            was performed, and patient medications and                            allergies were reviewed. The patient's tolerance of                            previous anesthesia was also reviewed. The risks                            and benefits of the procedure and the sedation                            options and risks were discussed with the patient.                            All questions were answered, and informed consent                            was obtained. Prior Anticoagulants: The patient has                            taken no previous anticoagulant or antiplatelet                            agents. ASA Grade Assessment: III - A patient with  severe systemic disease. After reviewing the risks                            and benefits, the patient was deemed in                            satisfactory condition to undergo the procedure.                           After obtaining informed consent, the colonoscope                            was passed under direct  vision. Throughout the                            procedure, the patient's blood pressure, pulse, and                            oxygen saturations were monitored continuously. The                            [id number]) scope was introduced through                            the anus and advanced to the 5 cm into the ileum.                            The colonoscopy was somewhat difficult due to                            significant looping. The patient tolerated the                            procedure well. The quality of the bowel                            preparation was adequate. The terminal ileum,                            ileocecal valve, appendiceal orifice, and rectum                            were photographed. Scope In: 8:48:07 AM Scope Out: 9:19:23 AM Scope Withdrawal Time: 0 hours 14 minutes 45 seconds  Total Procedure Duration: 0 hours 31 minutes 16 seconds  Findings:      The perianal and digital rectal examinations were normal. Redundant       colon. Suboptimal prep in the ascending egment. Site of prior       polypectomy and clipping opposite ileocecal valve inspected.Excellent       mucosal healing seen without any residual polyp tissue or scar apparent.      A 4 mm polyp was found in the ascending colon. The polyp was       semi-pedunculated. The polyp was removed with a cold snare. Resection  and retrieval were complete. Estimated blood loss was minimal.      The exam was otherwise without abnormality on direct and retroflexion       views. Impression:               Redundant colon                           - One 4 mm polyp in the ascending colon, removed                            with a cold snare. Resected and retrieved.                           - The examination was otherwise normal on direct                            and retroflexion views. Moderate Sedation:      Moderate (conscious) sedation was administered by the endoscopy nurse        and supervised by the endoscopist. The following parameters were       monitored: oxygen saturation, heart rate, blood pressure, respiratory       rate, EKG, adequacy of pulmonary ventilation, and response to care.       Total physician intraservice time was 35 minutes. Recommendation:           - Patient has a contact number available for                            emergencies. The signs and symptoms of potential                            delayed complications were discussed with the                            patient. Return to normal activities tomorrow.                            Written discharge instructions were provided to the                            patient.                           - Advance diet as tolerated.                           - Continue present medications.                           - No repeat colonoscopy due to age.                           - Return to GI clinic (date not yet determined).                           - Patient has a contact number available  for                            emergencies. The signs and symptoms of potential                            delayed complications were discussed with the                            patient. Return to normal activities tomorrow.                            Written discharge instructions were provided to the                            patient. Procedure Code(s):        --- Professional ---                           [phone], Colonoscopy, flexible; with removal of                            tumor(s), polyp(s), or other lesion(s) by snare                            technique                           99152, Moderate sedation services provided by the                            same physician or other qualified health care                            professional performing the diagnostic or                            therapeutic service that the sedation supports,                            requiring the presence of an  independent trained                            observer to assist in the monitoring of the                            patient's level of consciousness and physiological                            status; initial 15 minutes of intraservice time,                            patient age 34 years or older                           99153, Moderate sedation  services; each additional                            15 minutes intraservice time Diagnosis Code(s):        --- Professional ---                           Z86.010, Personal history of colonic polyps                           D12.2, Benign neoplasm of ascending colon CPT copyright 2016 American Medical Association. All rights reserved. The codes documented in this report are preliminary and upon coder review may  be revised to meet current compliance requirements. [name]. Marguerette Sheller, MD Norvel Richards, MD 09/01/2015 9:45:11 AM This report has been signed electronically. Number of Addenda: 0

## 2015-09-01 NOTE — Discharge Instructions (Signed)

## 2015-09-01 NOTE — H&P (View-Only) (Signed)
Primary Care Physician:  Wende Neighbors, MD  Primary Gastroenterologist:  Garfield Cornea, MD   Chief Complaint  Patient presents with  . Colonoscopy    HPI:  [name] is a 82 y.o. male here to schedule 6 month surveillance colonoscopy. He had a rather large polyp in the right side of the colon removed in piecemeal fashion. Clinically he is doing well. Denies melena, rectal bleeding. Constipation well managed with when necessary MiraLAX. No abdominal pain. Heartburn well-controlled. No GI complaints.  Current Outpatient Prescriptions  Medication Sig Dispense Refill  . amLODipine (NORVASC) 10 MG tablet Take 10 mg by mouth daily.    . calcium gluconate 500 MG tablet Take 500 mg by mouth daily.    . carbidopa-levodopa (SINEMET IR) 25-100 MG tablet Take 1 pill 5 times a day: 6, 10, 2PM, 6PM, and 9 PM 450 tablet 3  . glucosamine-chondroitin 500-400 MG tablet Take 1 tablet by mouth once.    . Multiple Vitamin (MULTIVITAMIN) tablet Take 1 tablet by mouth daily.    Marland Kitchen omeprazole (PRILOSEC OTC) 20 MG tablet Take 20 mg by mouth daily.    . pramipexole (MIRAPEX) 0.5 MG tablet Take 1 tablet (0.5 mg total) by mouth 3 (three) times daily. 270 tablet 3  . telmisartan-hydrochlorothiazide (MICARDIS HCT) 80-25 MG per tablet Take 1 tablet by mouth daily.    . valsartan-hydrochlorothiazide (DIOVAN-HCT) 80-12.5 MG tablet     . vitamin C (ASCORBIC ACID) 500 MG tablet Take 500 mg by mouth daily.     No current facility-administered medications for this visit.     Allergies as of 08/10/2015  . (No Known Allergies)    Past Medical History:  Diagnosis Date  . Arthritis   . Cancer [facility])    h/o  skin  cancer  . Ex-smoker   . GERD (gastroesophageal reflux disease)   . Hypercholesteremia   . Hypertension   . Lumbar stenosis   . Parkinson's disease ([place])   . Sleep apnea    ?? sleep apnea....tested 2013 @ [facility]     . Sleep disorder     Past Surgical History:  Procedure Laterality  Date  . APPENDECTOMY    . BACK SURGERY  11/2013  . COLONOSCOPY  11/17/2008   Rourk: Sessile polyp in the cecum status post saline-assisted piecemeal polypectomy, resolution clipping and epinephrine injection therapy performed. Shallow sigmoid diverticula.. Tubular adenoma  . COLONOSCOPY  11/19/2008   Fields: Large amount of liquid blood with clots seen throughout the colon. Previous Boston resolution clip remained in place. Purple spot seen in the lateral aspect polypectomy site, 3 resolution clips placed here. 6 mm sessile ascending colon polyp seen and not manipulated.  . COLONOSCOPY  11/25/2008   Rourk: Blood-tinged colonic effluent, suspect recurrent post polypectomy bleeding status post placement of 3 clips on the polypectomy site on top of the 3 clips that already existed, one had fallen off since last procedure. One small polyp distal to the cecum not manipulated  . COLONOSCOPY  12/06/2009   Rourk: Internal hemorrhoids, scattered left-sided diverticula, cecal polyps, one snared and subsequently clipped, 2 adjacent diminutive polyps ablated. tubular adenoma  . COLONOSCOPY N/A 01/21/2015   Dr. Gala Romney: 1.5 cm carpet-type polyp just distal to the ileocecal valve removed piecemeal fashion and ablated. Small polyp in the rectum. Pathology-tubular adenomas  . EYE SURGERY     bilateral  cataracts  . MENISCUS REPAIR     right knee  2000  . TONSILLECTOMY Bilateral  Family History  Problem Relation Age of Onset  . Dementia Mother   . Cancer Brother   . Stroke Brother     Social History   Social History  . Marital status: Married    Spouse name: Clinical research associate  . Number of children: 4  . Years of education: 26   Occupational History  . Retired     Social History Main Topics  . Smoking status: Former Smoker    Packs/day: 1.00    Types: Cigarettes, Pipe    Start date: 01/22/1951    Quit date: 01/21/1989  . Smokeless tobacco: Never Used  . Alcohol use No  . Drug use: No  . Sexual  activity: Not on file   Other Topics Concern  . Not on file   Social History Narrative  . No narrative on file      ROS:  General: Negative for anorexia,  fever, chills, fatigue, weakness. Weight down 10 pounds in the past 8 months.  Eyes: Negative for vision changes.  ENT: Negative for hoarseness, difficulty swallowing , nasal congestion. CV: Negative for chest pain, angina, palpitations, dyspnea on exertion, peripheral edema.  Respiratory: Negative for dyspnea at rest, dyspnea on exertion, cough, sputum, wheezing.  GI: See history of present illness. GU:  Negative for dysuria, hematuria, urinary incontinence, urinary frequency, nocturnal urination.  MS: Negative for joint pain, low back pain.  Derm: Negative for rash or itching.  Neuro: Negative for weakness, abnormal sensation, seizure, frequent headaches, memory loss, confusion.+tremor  Psych: Negative for anxiety, depression, suicidal ideation, hallucinations.  Endo: Negative for unusual weight change.  Heme: Negative for bruising or bleeding. Allergy: Negative for rash or hives.    Physical Examination:  BP 121/76   Pulse 83   Temp 97.8 F (36.6 C) (Oral)   Ht 5\' 11"  (1.803 m)   Wt 189 lb 9.6 oz (86 kg)   BMI 26.44 kg/m    General: Well-nourished, well-developed in no acute distress.  Head: Normocephalic, atraumatic.   Eyes: Conjunctiva pink, no icterus. Mouth: Oropharyngeal mucosa moist and pink , no lesions erythema or exudate. Neck: Supple without thyromegaly, masses, or lymphadenopathy.  Lungs: Clear to auscultation bilaterally.  Heart: Regular rate and rhythm, no murmurs rubs or gallops.  Abdomen: Bowel sounds are normal, nontender, nondistended, no hepatosplenomegaly or masses, no abdominal bruits or    hernia , no rebound or guarding.   Rectal: deferred Extremities: No lower extremity edema. No clubbing or deformities.  Neuro: Alert and oriented x 4 , grossly normal neurologically. +tremors, left  hand Skin: Warm and dry, no rash or jaundice.   Psych: Alert and cooperative, normal mood and affect.

## 2015-09-05 ENCOUNTER — Encounter: Payer: Self-pay | Admitting: Internal Medicine

## 2015-09-06 ENCOUNTER — Encounter (HOSPITAL_COMMUNITY): Payer: Self-pay | Admitting: Internal Medicine

## 2015-10-01 DIAGNOSIS — L853 Xerosis cutis: Secondary | ICD-10-CM | POA: Diagnosis not present

## 2015-10-01 DIAGNOSIS — L821 Other seborrheic keratosis: Secondary | ICD-10-CM | POA: Diagnosis not present

## 2015-10-01 DIAGNOSIS — D1801 Hemangioma of skin and subcutaneous tissue: Secondary | ICD-10-CM | POA: Diagnosis not present

## 2015-10-01 DIAGNOSIS — L570 Actinic keratosis: Secondary | ICD-10-CM | POA: Diagnosis not present

## 2015-10-01 DIAGNOSIS — L218 Other seborrheic dermatitis: Secondary | ICD-10-CM | POA: Diagnosis not present

## 2015-10-01 DIAGNOSIS — Z85828 Personal history of other malignant neoplasm of skin: Secondary | ICD-10-CM | POA: Diagnosis not present

## 2015-10-01 DIAGNOSIS — D692 Other nonthrombocytopenic purpura: Secondary | ICD-10-CM | POA: Diagnosis not present

## 2015-10-28 ENCOUNTER — Ambulatory Visit: Payer: Medicare Other

## 2015-10-28 ENCOUNTER — Other Ambulatory Visit: Payer: Self-pay

## 2015-10-28 ENCOUNTER — Ambulatory Visit: Payer: Medicare Other | Attending: Neurology | Admitting: Physical Therapy

## 2015-10-28 ENCOUNTER — Encounter: Payer: Self-pay | Admitting: Physical Therapy

## 2015-10-28 ENCOUNTER — Ambulatory Visit: Payer: Medicare Other | Admitting: Occupational Therapy

## 2015-10-28 DIAGNOSIS — R29818 Other symptoms and signs involving the nervous system: Secondary | ICD-10-CM | POA: Insufficient documentation

## 2015-10-28 DIAGNOSIS — R258 Other abnormal involuntary movements: Secondary | ICD-10-CM

## 2015-10-28 DIAGNOSIS — R471 Dysarthria and anarthria: Secondary | ICD-10-CM

## 2015-10-28 DIAGNOSIS — R1310 Dysphagia, unspecified: Secondary | ICD-10-CM

## 2015-10-28 DIAGNOSIS — G20 Parkinson's disease: Secondary | ICD-10-CM

## 2015-10-28 NOTE — Therapy (Signed)
[facility] [address] [place], [place], [address] Phone: [phone]   Fax:  [phone]  Patient Details  Name: [name] MRN: [id number] Date of Birth: 03/13/1931 Referring Provider:  Star Age, M.D.  Encounter Date: 10/28/2015  Speech Therapy Parkinson's Disease Screen   Pt has not been seen for ST services at this clinic in the past. Decibel Level today: 68-69dB - widely variable-  (WNL=70-72 dB) with sound level meter 30cm away from pt's mouth.   Pt does not report difficulty in swallowing warranting objective evaluation at this time.  Pt would benefit from speech-language eval for dysarthria. Please order via EPIC or fax a prescription to [phone].   [facility]  10/28/2015, 8:43 AM  [facility] [address] [place], [place], [address] Phone: [phone]   Fax:  [phone]

## 2015-10-28 NOTE — Therapy (Signed)
[facility] [address] [place], [place], [address] Phone: [phone]   Fax:  [phone]  Patient Details  Name: [name] MRN: [id number] Date of Birth: 02-06-1931 Referring Provider:  No ref. provider found  Encounter Date: 10/28/2015   PHYSICAL THERAPY DISCHARGE SUMMARY  Visits from Start of Care: 9 (01/26/15-02/25/15)  Current functional level related to goals / functional outcomes:     PT Long Term Goals - 02/25/15 0924      PT LONG TERM GOAL #1   Title Pt will be independent with HEP for improved transfers, balance, and gait.  TARGET 02/26/15   Time 4   Period Weeks   Status Achieved     PT LONG TERM GOAL #2   Title Pt will perform at least 8 of 10 reps of sit<>stand transfers from less than 18 inch surface without UE support and no posterior lean, for improved transfer efficiency and safety.   Time 4   Period Weeks   Status Achieved     PT LONG TERM GOAL #3   Title Pt will improve Functional Gait Assessment score to at least 22/30 for decreased fall risk.   Baseline 21/30 02/23/15   Time 4   Period Weeks   Status Not Met     PT LONG TERM GOAL #4   Title Pt will verbalize understanding of fall prevention in the home environment.   Time 4   Period Weeks   Status Achieved     PT LONG TERM GOAL #5   Title Pt will verbalize understanding of local Parkinson's disease-related resources.   Time 4   Period Weeks   Status Achieved       Remaining deficits: Bradykinesia, needs cueing for intensity for exercises   Education / Equipment: Educated in [employer], fall prevention, Parkinson's resources.  Plan: Patient agrees to discharge.  Patient goals were partially met. Patient is being discharged due to meeting the stated rehab goals.  ?????Pt is pleased with functional progress.       Antonea Gaut W. 10/28/2015, 1:02 PM   Frazier Butt., PT  [facility] [address] [place], [place], [address] Phone: [phone]   Fax:  [phone]

## 2015-10-28 NOTE — Therapy (Signed)
[facility] [address] [place], [place], [address] Phone: [phone]   Fax:  [phone]  Patient Details  Name: [name] MRN: [id number] Date of Birth: May 27, 1931 Referring Provider:  Celene Squibb, MD  Encounter Date: 10/28/2015 Occupational Therapy Parkinson's Disease Screen  Physical Performance Test item #2 (simulated eating):  12.25 sec  Physical Performance Test item #4 (donning/doffing jacket):  33.47 sec  9-hole peg test:    RUE  32.33sec        LUE  29.41 sec  Change in ability to perform ADLs/IADLs:  Yes difficulty donning/ doffing jacket, ADLs slower    Pt would benefit from occupational therapy evaluation due to  Decline in ADLs.    Tyse Auriemma 10/28/2015, 11:41 AM Theone Murdoch, OTR/L [phone] Phone: [phone] 11:45 AM 10/28/15 [facility] [address] [place], [place], [address] Phone: [phone]   Fax:  [phone]

## 2015-10-29 NOTE — Therapy (Signed)
[facility] [address] [place], [place], [address] Phone: [phone]   Fax:  [phone]  Patient Details  Name: [name] MRN: [id number] Date of Birth: 13-Nov-1931 Referring Provider:  Celene Squibb, MD  Encounter Date: 10/28/2015  Physical Therapy Parkinson's Disease Screen   Timed Up and Go test:10.56 sec (compared to 12.06 sec at last check)   10 meter walk test:  3.57 ft/sec (compared to 3.21 ft/sec)  5 time sit to stand test: 10.71 sec (compared to 13.25 sec)   Patient does not report falls, changes in mobility.   Patient does not require Physical Therapy services at this time.  Recommend Physical Therapy screen in 6-9 months.    Nydia Ytuarte W. 10/29/2015, 8:26 AM  Frazier Butt., PT  [facility] [address] [place], [place], [address] Phone: [phone]   Fax:  [phone]

## 2015-11-19 DIAGNOSIS — Z23 Encounter for immunization: Secondary | ICD-10-CM | POA: Diagnosis not present

## 2015-11-23 ENCOUNTER — Ambulatory Visit: Payer: Medicare Other

## 2015-11-23 ENCOUNTER — Ambulatory Visit: Payer: Medicare Other | Attending: Neurology | Admitting: Occupational Therapy

## 2015-11-23 DIAGNOSIS — R471 Dysarthria and anarthria: Secondary | ICD-10-CM

## 2015-11-23 DIAGNOSIS — R278 Other lack of coordination: Secondary | ICD-10-CM

## 2015-11-23 DIAGNOSIS — R29818 Other symptoms and signs involving the nervous system: Secondary | ICD-10-CM

## 2015-11-23 DIAGNOSIS — R293 Abnormal posture: Secondary | ICD-10-CM | POA: Diagnosis not present

## 2015-11-23 DIAGNOSIS — M25612 Stiffness of left shoulder, not elsewhere classified: Secondary | ICD-10-CM | POA: Diagnosis not present

## 2015-11-23 DIAGNOSIS — R29898 Other symptoms and signs involving the musculoskeletal system: Secondary | ICD-10-CM | POA: Insufficient documentation

## 2015-11-23 DIAGNOSIS — R2689 Other abnormalities of gait and mobility: Secondary | ICD-10-CM | POA: Insufficient documentation

## 2015-11-23 DIAGNOSIS — M25621 Stiffness of right elbow, not elsewhere classified: Secondary | ICD-10-CM | POA: Diagnosis not present

## 2015-11-23 DIAGNOSIS — R2681 Unsteadiness on feet: Secondary | ICD-10-CM | POA: Diagnosis not present

## 2015-11-23 DIAGNOSIS — M25622 Stiffness of left elbow, not elsewhere classified: Secondary | ICD-10-CM | POA: Insufficient documentation

## 2015-11-23 NOTE — Patient Instructions (Signed)
  Get a good breath from your belly. Do ten loud "HEY - AH"  - don't force it.  Do this twice a day.

## 2015-11-23 NOTE — Therapy (Signed)
[facility] [address] [place], [place], [address] Phone: [phone]   Fax:  [phone]  Speech Language Pathology Evaluation  Patient Details  Name: [name] MRN: [id number] Date of Birth: December 27, 1931 Referring Provider: Star Age, M.D.  Encounter Date: 11/23/2015      End of Session - 11/23/15 1009    Visit Number 1   Number of Visits 17   Date for SLP Re-Evaluation 02/04/16   SLP Start Time 0848   SLP Stop Time  0932   SLP Time Calculation (min) 44 min   Activity Tolerance Patient tolerated treatment well      Past Medical History:  Diagnosis Date  . Arthritis   . Cancer [facility])    h/o  skin  cancer  . Ex-smoker   . GERD (gastroesophageal reflux disease)   . Hypercholesteremia   . Hypertension   . Lumbar stenosis   . Parkinson's disease ([place])   . Sleep apnea    ?? sleep apnea....tested 2013 @ [facility]     . Sleep disorder     Past Surgical History:  Procedure Laterality Date  . APPENDECTOMY    . BACK SURGERY  11/2013  . COLONOSCOPY  11/17/2008   Rourk: Sessile polyp in the cecum status post saline-assisted piecemeal polypectomy, resolution clipping and epinephrine injection therapy performed. Shallow sigmoid diverticula.. Tubular adenoma  . COLONOSCOPY  11/19/2008   Fields: Large amount of liquid blood with clots seen throughout the colon. Previous Boston resolution clip remained in place. Purple spot seen in the lateral aspect polypectomy site, 3 resolution clips placed here. 6 mm sessile ascending colon polyp seen and not manipulated.  . COLONOSCOPY  11/25/2008   Rourk: Blood-tinged colonic effluent, suspect recurrent post polypectomy bleeding status post placement of 3 clips on the polypectomy site on top of the 3 clips that already existed, one had fallen off since last procedure. One small polyp distal to the cecum not manipulated  . COLONOSCOPY  12/06/2009   Rourk:  Internal hemorrhoids, scattered left-sided diverticula, cecal polyps, one snared and subsequently clipped, 2 adjacent diminutive polyps ablated. tubular adenoma  . COLONOSCOPY N/A 01/21/2015   Dr. Gala Romney: 1.5 cm carpet-type polyp just distal to the ileocecal valve removed piecemeal fashion and ablated. Small polyp in the rectum. Pathology-tubular adenomas  . COLONOSCOPY N/A 09/01/2015   Procedure: COLONOSCOPY;  Surgeon: Daneil Dolin, MD;  [facility];  Service: Endoscopy;  Laterality: N/A;  845  . EYE SURGERY     bilateral  cataracts  . MENISCUS REPAIR     right knee  2000  . POLYPECTOMY  09/01/2015   Procedure: POLYPECTOMY;  Surgeon: Daneil Dolin, MD;  [facility];  Service: Endoscopy;;  colon  . TONSILLECTOMY Bilateral     There were no vitals filed for this visit.      Subjective Assessment - 11/23/15 0905    Subjective "I think it was last week" (when pt here last - he was here 3 weeks ago) "It seems like I can't project (my voice)."            SLP Evaluation [facility] - 11/23/15 [id number]      SLP Visit Information   SLP Received On 11/23/15   Referring Provider Star Age, M.D.   Medical Diagnosis Parkinson's Disease     General Information   HPI Pt thinks he was diagnosed approx 7 years ago. Pt states incr'd shaking is the only change since  the diagnosis however upon further questioning, pt shares that he has lost strength, that balance is more of an issue, and he moves slower than he did  prior to diagnosis.     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 Comprehension   Overall [place] within functional limits for tasks assessed     Oral Motor/Sensory Function   Overall Oral Motor/Sensory Function Impaired   Labial ROM Within Functional Limits    Labial Symmetry Abnormal symmetry right   Labial Strength Reduced   Labial Coordination Reduced   Lingual Symmetry Within Functional Limits  notable tremor upon protrusion   Lingual Strength Reduced Left   Lingual Coordination Reduced     Motor Speech   Overall Motor Speech Impaired   Phonation Low vocal intensity;Breathy;Aphonic   Phonation Impaired   Volume Soft  Variable in conversation- 65-73dB; avg 69dB with aphonia   Pitch --  monopitch                         SLP Education - 11/23/15 1008    Education provided Yes   Education Details loud "hey-ah" as transition to loud "ah", abdominal muscle work in [employer] speech loudness   Person(s) Educated Patient   Methods Explanation;Demonstration;Verbal cues;Tactile cues   Comprehension Verbalized understanding;Returned demonstration;Verbal cues required;Need further instruction;Tactile cues required          SLP Short Term Goals - 11/23/15 1022      SLP SHORT TERM GOAL #1   Title pt will phonate loud /a/ average 88dB for 3 sessions   Time 4   Period Weeks   Status New     SLP SHORT TERM GOAL #2   Title pt will produce sentences with average 71dB with WNL voicing   Time 4   Period Weeks   Status New     SLP SHORT TERM GOAL #3   Title pt will maintain loudness at average 70dB with WNL voicing in 5 minutes simple conversation   Time 4   Period Weeks   Status New     SLP SHORT TERM GOAL #4   Title pt will demo abdominal breathing in 5 minutes simple conversation 75% of the time over three sessions   Time 4   Period Weeks   Status New          SLP Long Term Goals - 11/23/15 1040      SLP LONG TERM GOAL #1   Title pt will maintain loud /a/ with average 88dB in 6 therapy sessions   Time 8   Period Weeks   Status New     SLP LONG TERM GOAL #2   Title pt will use functional voicing in 10 minutes of simple conversation and maintain average 70dB over three sessions   Time 8   Period Weeks    Status New     SLP LONG TERM GOAL #3   Title pt will demo abdominal breathing in 5 minutes mod complex conversation 50% of the time in three sessions   Time 8   Period Weeks   Status New          Plan - 11/23/15 1009    Clinical Impression Statement Pt presents today with hyophonic speech, with average  at 69dB with widely variable loudness levels in simple to mod complex conversation, likely in part due to reduced breath support for speech as well as incr'd linguistic demand necessary for word finding/sentence construction in conversation. Pt would benefit from skilled ST to improve his ability to communicate at home and in the community.   Speech Therapy Frequency 2x / week   Duration --  8 weeks   Treatment/Interventions Compensatory strategies;Functional tasks;Cueing hierarchy;Patient/family education;Multimodal communcation approach;Language facilitation;SLP instruction and feedback;Internal/external aids  any or all may be used   Potential to Achieve Goals Good   Consulted and Agree with Plan of Care Patient      Patient will benefit from skilled therapeutic intervention in order to improve the following deficits and impairments:   Dysarthria and anarthria      G-Codes - 12-23-15 1045    Functional Assessment Tool Used noms- 5   Functional Limitations Motor speech   Motor Speech Current Status [phone]) At least 40 percent but less than 60 percent impaired, limited or restricted   Motor Speech Goal Status [id number]) At least 20 percent but less than 40 percent impaired, limited or restricted      Problem List Patient Active Problem List   Diagnosis Date Noted  . History of colonic polyps   . Hx of adenomatous colonic polyps 01/04/2015  . Lumbar canal stenosis 12/04/2013  . Aortic stenosis 10/21/2013  . Hypoxemia 04/16/2012  . Primary central sleep apnea 04/16/2012  . Obstructive sleep apnea (adult) (pediatric) 04/16/2012  . Orthostatic hypotension 04/16/2012  . Sleep  disturbance, unspecified 04/16/2012  . Unspecified hereditary and idiopathic peripheral neuropathy 04/16/2012  . Parkinson's 04/16/2012  . COLONIC POLYPS, ADENOMATOUS 11/19/2008  . GERD 11/19/2008  . HEMATOCHEZIA 11/19/2008  . GI BLEEDING 11/19/2008  . HYPERLIPIDEMIA-MIXED 07/03/2008  . HYPERTENSION, UNSPECIFIED 07/03/2008    [facility] ,MS, CCC-SLP  12-23-15, 10:48 AM  [facility] [address] [place], [place], [address] Phone: [phone]   Fax:  [phone]  Name: [name] MRN: [id number] Date of Birth: [birth year]

## 2015-11-23 NOTE — Therapy (Signed)
[facility] [address] [place], [place], [address] Phone: [phone]   Fax:  [phone]  Occupational Therapy Evaluation  Patient Details  Name: [name] MRN: [id number] Date of Birth: 1931-03-15 Referring Provider: Dr. Star Age  Encounter Date: 11/23/2015      OT End of Session - 11/23/15 1536    Visit Number 1   Number of Visits 17   Date for OT Re-Evaluation 01/22/16   Authorization Type Medicare/Aetna (G-code needed)   Authorization Time Period cert. 11/23/15-01/22/16   Authorization - Visit Number 1   Authorization - Number of Visits 10   OT Start Time [phone]   OT Stop Time 1015   OT Time Calculation (min) 39 min   Activity Tolerance Patient tolerated treatment well   Behavior During Therapy WFL for tasks assessed/performed      Past Medical History:  Diagnosis Date  . Arthritis   . Cancer [facility])    h/o  skin  cancer  . Ex-smoker   . GERD (gastroesophageal reflux disease)   . Hypercholesteremia   . Hypertension   . Lumbar stenosis   . Parkinson's disease ([place])   . Sleep apnea    ?? sleep apnea....tested 2013 @ [facility]     . Sleep disorder     Past Surgical History:  Procedure Laterality Date  . APPENDECTOMY    . BACK SURGERY  11/2013  . COLONOSCOPY  11/17/2008   Rourk: Sessile polyp in the cecum status post saline-assisted piecemeal polypectomy, resolution clipping and epinephrine injection therapy performed. Shallow sigmoid diverticula.. Tubular adenoma  . COLONOSCOPY  11/19/2008   Fields: Large amount of liquid blood with clots seen throughout the colon. Previous [place] resolution clip remained in place. Purple spot seen in the lateral aspect polypectomy site, 3 resolution clips placed here. 6 mm sessile ascending colon polyp seen and not manipulated.  . COLONOSCOPY  11/25/2008   Rourk: Blood-tinged colonic effluent, suspect recurrent post polypectomy bleeding status  post placement of 3 clips on the polypectomy site on top of the 3 clips that already existed, one had fallen off since last procedure. One small polyp distal to the cecum not manipulated  . COLONOSCOPY  12/06/2009   Rourk: Internal hemorrhoids, scattered left-sided diverticula, cecal polyps, one snared and subsequently clipped, 2 adjacent diminutive polyps ablated. tubular adenoma  . COLONOSCOPY N/A 01/21/2015   Dr. Gala Romney: 1.5 cm carpet-type polyp just distal to the ileocecal valve removed piecemeal fashion and ablated. Small polyp in the rectum. Pathology-tubular adenomas  . COLONOSCOPY N/A 09/01/2015   Procedure: COLONOSCOPY;  Surgeon: Daneil Dolin, MD;  [facility];  Service: Endoscopy;  Laterality: N/A;  845  . EYE SURGERY     bilateral  cataracts  . MENISCUS REPAIR     right knee  2000  . POLYPECTOMY  09/01/2015   Procedure: POLYPECTOMY;  Surgeon: Daneil Dolin, MD;  [facility];  Service: Endoscopy;;  colon  . TONSILLECTOMY Bilateral     There were no vitals filed for this visit.      Subjective Assessment - 11/23/15 0941    Subjective  "I'm slower"   Pertinent History PD diagnosis approx 6 years ago; hx of low back surgery 11/2013; hx of bilateral RTC injuries; HTN, othostatic hypotension, sleep apnea, lumbar stenosis   Patient Stated Goals improving donning coat, improve fine motor skills   Currently in Pain? No/denies  [facility] OT Assessment - 11/23/15 0001      Assessment   Diagnosis Parkinson's disease    Referring Provider Dr. 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 [employer] rides exercise bike 4x week     ADL   Eating/Feeding Modified independent  min spills, difficulty cutting, opening containers/packages   Eating/Feeding Patient Percentage --  incr time   Grooming Modified independent   Upper Body Bathing Modified independent   Lower Body Bathing Increased time   Upper Body Dressing --  incr time, difficulty buttons, difficulty donning jacket   Lower Body Dressing Modified independent   Toilet Tranfer --  min difficulty with standard toilet   Toilet Transfer: Patient Percentage --  has higher toilets at home   Toileting - Immunologist independent   [place] Transfer Modified independent  walk in, grab bars   Transfers/Ambulation Related to ADL's difficulty getting up from low, soft surface     IADL   Shopping Takes care of all shopping needs independently   Prior Level of Function Light Housekeeping Pt performs now with diffuclty bending due to back pain   Prior Level of Function [employer] wife performs most cooking   Programmer, applications own vehicle   Medication Management Is responsible for taking medication in correct dosages at correct time   Physiological scientist financial matters independently (budgets, writes checks, pays rent, bills goes to bank), collects and keeps track of income     Mobility   Mobility Status Independent   Mobility Status Comments pt reports hesitation at [employer] Expression   Dominant Hand Right   Handwriting Mild micrographia;90% legible     Vision - History   Baseline Vision Bifocals   Additional Comments denies diplopia     Cognition   Overall Cognitive Status Within Functional Limits for tasks assessed     Observation/Other Assessments   Observations Rounded  shoulders, posterior pelvic tilt, forward head   Standing Functional Reach Test R-5", L-4"    Physical Performance Test   Yes   Simulated Eating Time (seconds) 13.40   Simulated Eating Comments holds spoon at end due to bradykinesia   Donning Doffing Jacket Time (seconds) 24.84sec with gown   Donning Doffing Jacket Comments Buttoning/unbuttoning 3 buttons on table in 43.44sec     Coordination   9 Hole Peg Test Right;Left   Right 9 Hole Peg Test 30.56   Left 9 Hole Peg Test 35.15   Box and Blocks R-46, L-47blocks   Tremors mod resting tremors in BUEs L>R     Tone   Assessment Location Right Upper Extremity;Left Upper Extremity     ROM / Strength   AROM / PROM / Strength AROM     AROM   Overall AROM  Deficits   AROM Assessment Site Elbow   Right Shoulder  Flexion 120 Degrees   Left Shoulder Flexion 115 Degrees   Right/Left Elbow Right;Left   Right Elbow Extension -30   Left Elbow Extension -20     RUE Tone   RUE Tone --  mild-mod     LUE Tone   LUE Tone Moderate                           OT Short Term Goals - 11/23/15 1544      OT SHORT TERM GOAL #1   Title Pt will be independent with updated PD-specific HEP.--check STGs 12/22/15   Time 4   Period Weeks   Status New     OT SHORT TERM GOAL #2   Title Pt will demo at least 120* L shoulder flex with -20* or greater elbow extension for improved overhead reaching.   Baseline 115* with -25* elbow ext   Time 4   Period Weeks   Status New     OT SHORT TERM GOAL #3   Title Pt will be able to button/unbutton 3 buttons on tabletop in 40 sec or less for incr ease with dressing.   Baseline 43.44   Time 4   Period Weeks   Status New     OT SHORT TERM GOAL #4   Title Pt will be able to write 3 sentences with 100% accuracy and no significant incr in size.   Time 4   Status New           OT Long Term Goals - 11/23/15 1545      OT LONG TERM GOAL #1   Title Pt will verbalize understanding of  updated strategies/AE to incr ease with ADLs prn.--check LTGs 01/22/16   Time 8   Period Weeks   Status New     OT LONG TERM GOAL #2   Title Pt will improve bradykinesia, functional reaching, coordination for ADLs as shown by improving score on box and blocks test by at least 4 blocks bilaterally.   Baseline R-46 blocks, L-47 blocks   Status New     OT LONG TERM GOAL #3   Title Pt will improve ability to don/doff jacket as shown by improving time on PPT#4 (with gown) in 21sec or less.   Baseline 24.84sec    Time 8   Period Weeks   Status New     OT LONG TERM GOAL #4   Title Pt will be able to button/unbutton 3 buttons on tabletop in 35sec or less for incr ease with dressing.   Baseline 43.44sec   Time 8   Period Weeks   Status New     OT LONG TERM GOAL #5   Title Pt will improve bilateral standing functional reach by at least 3" for incr balance for IADLs.   Baseline R-5", L-4"   Time 8   Period Weeks   Status New     OT LONG TERM GOAL #6   Title -------------------------     OT LONG TERM GOAL #7   Title ----------------------               Plan - 11/23/15 1537    Clinical Impression Statement Pt is a 82 y.o. male with Parkinson's disease.  Per therapy screen 10/28/15, it was OT eval was recommended due to slowing with ADLs.  Pt last seen for OT 04/2015.  Pt with PMH that includes:  PD diagnosis approx 6 years ago; hx of low  back surgery 11/2013; hx of bilateral RTC injuries; HTN, othostatic hypotension, sleep apnea, lumbar stenosis.  Pt presents with bradykinesia, rigidity, decr coordination, decr posture, decr ROM, tremors, decr balance/functional mobility for ADLs.  Pt would benefit from occupational therapy to address these deficits to incr ease/safety with ADLs, improve quality of life, and prevent future complications.   Rehab Potential Good   OT Frequency 2x / week   OT Duration 8 weeks  +eval   OT Treatment/Interventions Self-care/ADL training;Moist  Heat;Electrical Stimulation;Fluidtherapy;Contrast Bath;Passive range of motion;Therapeutic activities;Cognitive remediation/compensation;DME and/or AE instruction;Parrafin;Cryotherapy;Therapeutic exercises;Manual Therapy;Neuromuscular education;Splinting;Ultrasound;Energy conservation;Therapeutic exercise;Functional Mobility Training;Patient/family education;Balance training   Plan PWR! seated, supine, hands (as time allows)   OT Home Exercise Plan --   Recommended Other Services receiving speech therapy   Consulted and Agree with Plan of Care Patient      Patient will benefit from skilled therapeutic intervention in order to improve the following deficits and impairments:  Decreased mobility, Impaired UE functional use, Decreased knowledge of use of DME, Decreased balance, Decreased coordination, Decreased range of motion, Impaired tone, Decreased cognition, Improper spinal/pelvic alignment, Decreased strength, Impaired flexibility, Decreased activity tolerance (bradykinesia, rigidity, tremors)  Visit Diagnosis: Other symptoms and signs involving the nervous system  Other symptoms and signs involving the musculoskeletal system  Abnormal posture  Other lack of coordination  Unsteadiness  Other abnormalities of gait and mobility  Stiffness of left elbow, not elsewhere classified  Stiffness of right elbow, not elsewhere classified  Stiffness of left shoulder, not elsewhere classified      G-Codes - 12-03-15 1551    Functional Assessment Tool Used Standing functional reach:  R-5", L-4"; fastening/unfastening 3 butons in 43.44sec, box and blocks test:  R-46, L-47 blocks   Functional Limitation Carrying, moving and handling objects   Carrying, Moving and Handling Objects Current Status [id number]) At least 20 percent but less than 40 percent impaired, limited or restricted   Carrying, Moving and Handling Objects Goal Status [id number]) At least 1 percent but less than 20 percent impaired,  limited or restricted      Problem List Patient Active Problem List   Diagnosis Date Noted  . History of colonic polyps   . Hx of adenomatous colonic polyps 01/04/2015  . Lumbar canal stenosis 12/04/2013  . Aortic stenosis 10/21/2013  . Hypoxemia 04/16/2012  . Primary central sleep apnea 04/16/2012  . Obstructive sleep apnea (adult) (pediatric) 04/16/2012  . Orthostatic hypotension 04/16/2012  . Sleep disturbance, unspecified 04/16/2012  . Unspecified hereditary and idiopathic peripheral neuropathy 04/16/2012  . Parkinson's 04/16/2012  . COLONIC POLYPS, ADENOMATOUS 11/19/2008  . GERD 11/19/2008  . HEMATOCHEZIA 11/19/2008  . GI BLEEDING 11/19/2008  . HYPERLIPIDEMIA-MIXED 07/03/2008  . HYPERTENSION, UNSPECIFIED 07/03/2008    [facility] 12/03/15, 3:53 PM  [facility] [address] [place], [place], [address] Phone: [phone]   Fax:  [phone]  Name: [name] MRN: [id number] Date of Birth: 09/10/1931   Vianne Bulls, OTR/L [facility] [address] [place], [place]  [address] [phone] phone [phone] 03-Dec-2015 3:53 PM

## 2015-11-24 DIAGNOSIS — Z23 Encounter for immunization: Secondary | ICD-10-CM | POA: Diagnosis not present

## 2015-11-26 DIAGNOSIS — I10 Essential (primary) hypertension: Secondary | ICD-10-CM | POA: Diagnosis not present

## 2015-11-26 DIAGNOSIS — E782 Mixed hyperlipidemia: Secondary | ICD-10-CM | POA: Diagnosis not present

## 2015-11-26 DIAGNOSIS — E039 Hypothyroidism, unspecified: Secondary | ICD-10-CM | POA: Diagnosis not present

## 2015-11-26 DIAGNOSIS — I482 Chronic atrial fibrillation: Secondary | ICD-10-CM | POA: Diagnosis not present

## 2015-11-26 DIAGNOSIS — E785 Hyperlipidemia, unspecified: Secondary | ICD-10-CM | POA: Diagnosis not present

## 2015-11-26 DIAGNOSIS — E119 Type 2 diabetes mellitus without complications: Secondary | ICD-10-CM | POA: Diagnosis not present

## 2015-11-26 DIAGNOSIS — N529 Male erectile dysfunction, unspecified: Secondary | ICD-10-CM | POA: Diagnosis not present

## 2015-11-26 DIAGNOSIS — R7301 Impaired fasting glucose: Secondary | ICD-10-CM | POA: Diagnosis not present

## 2015-11-30 ENCOUNTER — Encounter: Payer: Medicare Other | Admitting: *Deleted

## 2015-11-30 ENCOUNTER — Ambulatory Visit: Payer: Medicare Other | Admitting: Occupational Therapy

## 2015-11-30 DIAGNOSIS — E782 Mixed hyperlipidemia: Secondary | ICD-10-CM | POA: Diagnosis not present

## 2015-11-30 DIAGNOSIS — R7301 Impaired fasting glucose: Secondary | ICD-10-CM | POA: Diagnosis not present

## 2015-11-30 DIAGNOSIS — I714 Abdominal aortic aneurysm, without rupture: Secondary | ICD-10-CM | POA: Diagnosis not present

## 2015-11-30 DIAGNOSIS — Z0001 Encounter for general adult medical examination with abnormal findings: Secondary | ICD-10-CM | POA: Diagnosis not present

## 2015-11-30 DIAGNOSIS — G20 Parkinson's disease: Secondary | ICD-10-CM | POA: Diagnosis not present

## 2015-11-30 DIAGNOSIS — R944 Abnormal results of kidney function studies: Secondary | ICD-10-CM | POA: Diagnosis not present

## 2015-11-30 DIAGNOSIS — I10 Essential (primary) hypertension: Secondary | ICD-10-CM | POA: Diagnosis not present

## 2015-11-30 DIAGNOSIS — Z6827 Body mass index (BMI) 27.0-27.9, adult: Secondary | ICD-10-CM | POA: Diagnosis not present

## 2015-12-03 ENCOUNTER — Ambulatory Visit: Payer: Medicare Other

## 2015-12-03 ENCOUNTER — Ambulatory Visit: Payer: Medicare Other | Admitting: Occupational Therapy

## 2015-12-03 DIAGNOSIS — R293 Abnormal posture: Secondary | ICD-10-CM

## 2015-12-03 DIAGNOSIS — R29818 Other symptoms and signs involving the nervous system: Secondary | ICD-10-CM

## 2015-12-03 DIAGNOSIS — R29898 Other symptoms and signs involving the musculoskeletal system: Secondary | ICD-10-CM | POA: Diagnosis not present

## 2015-12-03 DIAGNOSIS — R278 Other lack of coordination: Secondary | ICD-10-CM

## 2015-12-03 DIAGNOSIS — R2689 Other abnormalities of gait and mobility: Secondary | ICD-10-CM | POA: Diagnosis not present

## 2015-12-03 DIAGNOSIS — R471 Dysarthria and anarthria: Secondary | ICD-10-CM

## 2015-12-03 DIAGNOSIS — R2681 Unsteadiness on feet: Secondary | ICD-10-CM | POA: Diagnosis not present

## 2015-12-03 DIAGNOSIS — M25612 Stiffness of left shoulder, not elsewhere classified: Secondary | ICD-10-CM

## 2015-12-03 DIAGNOSIS — M25622 Stiffness of left elbow, not elsewhere classified: Secondary | ICD-10-CM

## 2015-12-03 DIAGNOSIS — M25621 Stiffness of right elbow, not elsewhere classified: Secondary | ICD-10-CM

## 2015-12-03 NOTE — Patient Instructions (Signed)
  Please complete the assigned speech therapy homework and return it to your next session.  Complete 10 loud "Hey!" with a full breath and clear voice, twice each day.

## 2015-12-03 NOTE — Therapy (Signed)
[facility] [address] [place], [place], [address] Phone: [phone]   Fax:  [phone]  Speech Language Pathology Treatment  Patient Details  Name: [name] MRN: [id number] Date of Birth: [birth year] Referring Provider: Star Age, M.D.  Encounter Date: 12/03/2015      End of Session - 12/03/15 1517    Visit Number 2   Number of Visits 17   Date for SLP Re-Evaluation 02/04/16   SLP Start Time 1404   SLP Stop Time  1445   SLP Time Calculation (min) 41 min   Activity Tolerance Patient tolerated treatment well      Past Medical History:  Diagnosis Date  . Arthritis   . Cancer [facility])    h/o  skin  cancer  . Ex-smoker   . GERD (gastroesophageal reflux disease)   . Hypercholesteremia   . Hypertension   . Lumbar stenosis   . Parkinson's disease ([place])   . Sleep apnea    ?? sleep apnea....tested 2013 @ [facility]     . Sleep disorder     Past Surgical History:  Procedure Laterality Date  . APPENDECTOMY    . BACK SURGERY  11/2013  . COLONOSCOPY  11/17/2008   Rourk: Sessile polyp in the cecum status post saline-assisted piecemeal polypectomy, resolution clipping and epinephrine injection therapy performed. Shallow sigmoid diverticula.. Tubular adenoma  . COLONOSCOPY  11/19/2008   Fields: Large amount of liquid blood with clots seen throughout the colon. Previous Boston resolution clip remained in place. Purple spot seen in the lateral aspect polypectomy site, 3 resolution clips placed here. 6 mm sessile ascending colon polyp seen and not manipulated.  . COLONOSCOPY  11/25/2008   Rourk: Blood-tinged colonic effluent, suspect recurrent post polypectomy bleeding status post placement of 3 clips on the polypectomy site on top of the 3 clips that already existed, one had fallen off since last procedure. One small polyp distal to the cecum not manipulated  . COLONOSCOPY  12/06/2009   Rourk:  Internal hemorrhoids, scattered left-sided diverticula, cecal polyps, one snared and subsequently clipped, 2 adjacent diminutive polyps ablated. tubular adenoma  . COLONOSCOPY N/A 01/21/2015   Dr. Gala Romney: 1.5 cm carpet-type polyp just distal to the ileocecal valve removed piecemeal fashion and ablated. Small polyp in the rectum. Pathology-tubular adenomas  . COLONOSCOPY N/A 09/01/2015   Procedure: COLONOSCOPY;  Surgeon: Daneil Dolin, MD;  [facility];  Service: Endoscopy;  Laterality: N/A;  845  . EYE SURGERY     bilateral  cataracts  . MENISCUS REPAIR     right knee  2000  . POLYPECTOMY  09/01/2015   Procedure: POLYPECTOMY;  Surgeon: Daneil Dolin, MD;  [facility];  Service: Endoscopy;;  colon  . TONSILLECTOMY Bilateral     There were no vitals filed for this visit.      Subjective Assessment - 12/03/15 1408    Subjective Pt with sub-70dB speech upon entering ST room.   Currently in Pain? No/denies               ADULT SLP TREATMENT - 12/03/15 1410      General Information   Behavior/Cognition Alert;Cooperative;Pleasant mood     Treatment Provided   Treatment provided Cognitive-Linquistic     Cognitive-Linquistic Treatment   Treatment focused on Dysarthria   Skilled Treatment Pt has been completing "hey - ah" at home, however, with decr'd breath support and sub-WNL loudness. When SLP shaped pt's loud "hey-ah"  with arm extension to stress full breath, sub-optimal loudness was seen - likely due to cognitive difficulty multitasking with an unfamiliar task. Approx 15 minutes spent on this. Pt produced synonyms for simple words with rare min A from SLP with 70dB 90% of the time. Conversation between tasks or between stimuli was sub-70dB.     Assessment / Recommendations / Plan   Plan Continue with current plan of care          SLP Education - 12/03/15 1516    Education provided Yes   Education Details loud /a/ and "hey" practice, full  breath/abdominal breath   Person(s) Educated Patient   Methods Explanation;Demonstration;Verbal cues   Comprehension Verbalized understanding;Returned demonstration;Verbal cues required;Need further instruction          SLP Short Term Goals - 12/03/15 1518      SLP SHORT TERM GOAL #1   Title pt will phonate loud /a/ average 88dB for 3 sessions   Time 4   Period Weeks   Status On-going     SLP SHORT TERM GOAL #2   Title pt will produce sentences with average 71dB with WNL voicing   Time 4   Period Weeks   Status On-going     SLP SHORT TERM GOAL #3   Title pt will maintain loudness at average 70dB with WNL voicing in 5 minutes simple conversation   Time 4   Period Weeks   Status On-going     SLP SHORT TERM GOAL #4   Title pt will demo abdominal breathing in 5 minutes simple conversation 75% of the time over three sessions   Time 4   Period Weeks   Status On-going          SLP Long Term Goals - 12/03/15 1519      SLP LONG TERM GOAL #1   Title pt will maintain loud /a/ with average 88dB in 6 therapy sessions   Time 8   Period Weeks   Status On-going     SLP LONG TERM GOAL #2   Title pt will use functional voicing in 10 minutes of simple conversation and maintain average 70dB over three sessions   Time 8   Period Weeks   Status On-going     SLP LONG TERM GOAL #3   Title pt will demo abdominal breathing in 5 minutes mod complex conversation 50% of the time in three sessions   Time 8   Period Weeks   Status On-going          Plan - 12/03/15 1517    Clinical Impression Statement Pt presents today with cont'd hyophonic speech. Pt did not exhibit carryover of louder speech noted in his semi-structured/structured tasks into conversation. Pt would benefit from skilled ST to improve his ability to communicate at home and in the community.   Speech Therapy Frequency 2x / week   Duration --  8 weeks   Treatment/Interventions Compensatory strategies;Functional  tasks;Cueing hierarchy;Patient/family education;Multimodal communcation approach;Language facilitation;SLP instruction and feedback;Internal/external aids  any or all may be used   Potential to Achieve Goals Good   Consulted and Agree with Plan of Care Patient      Patient will benefit from skilled therapeutic intervention in order to improve the following deficits and impairments:   Dysarthria and anarthria    Problem List Patient Active Problem List   Diagnosis Date Noted  . History of colonic polyps   . Hx of adenomatous colonic polyps 01/04/2015  . Lumbar canal  stenosis 12/04/2013  . Aortic stenosis 10/21/2013  . Hypoxemia 04/16/2012  . Primary central sleep apnea 04/16/2012  . Obstructive sleep apnea (adult) (pediatric) 04/16/2012  . Orthostatic hypotension 04/16/2012  . Sleep disturbance, unspecified 04/16/2012  . Unspecified hereditary and idiopathic peripheral neuropathy 04/16/2012  . Parkinson's 04/16/2012  . COLONIC POLYPS, ADENOMATOUS 11/19/2008  . GERD 11/19/2008  . HEMATOCHEZIA 11/19/2008  . GI BLEEDING 11/19/2008  . HYPERLIPIDEMIA-MIXED 07/03/2008  . HYPERTENSION, UNSPECIFIED 07/03/2008    [facility] ,[place], CCC-SLP  12/03/2015, 3:19 PM  [facility] [address] [place], [place], [address] Phone: [phone]   Fax:  [phone]   Name: [name] MRN: [id number] Date of Birth: [birth year]

## 2015-12-04 NOTE — Therapy (Signed)
[facility] [address] [place], [place], [address] Phone: [phone]   Fax:  [phone]  Occupational Therapy Treatment  Patient Details  Name: [name] MRN: [id number] Date of Birth: 08-30-1931 Referring Provider: Dr. Star Age  Encounter Date: 12/03/2015      OT End of Session - 12/04/15 1012    Visit Number 2   Number of Visits 17   Date for OT Re-Evaluation 01/22/16   Authorization Type Medicare/[employer] (G-code needed)   Authorization Time Period cert. 11/23/15-01/22/16   Authorization - Visit Number 2   Authorization - Number of Visits 10   OT Start Time [id number]   OT Stop Time 1400   OT Time Calculation (min) 43 min   Activity Tolerance Patient tolerated treatment well   Behavior During Therapy WFL for tasks assessed/performed      Past Medical History:  Diagnosis Date  . Arthritis   . Cancer [facility])    h/o  skin  cancer  . Ex-smoker   . GERD (gastroesophageal reflux disease)   . Hypercholesteremia   . Hypertension   . Lumbar stenosis   . Parkinson's disease ([place])   . Sleep apnea    ?? sleep apnea....tested 2013 @ [facility]     . Sleep disorder     Past Surgical History:  Procedure Laterality Date  . APPENDECTOMY    . BACK SURGERY  11/2013  . COLONOSCOPY  11/17/2008   Rourk: Sessile polyp in the cecum status post saline-assisted piecemeal polypectomy, resolution clipping and epinephrine injection therapy performed. Shallow sigmoid diverticula.. Tubular adenoma  . COLONOSCOPY  11/19/2008   Fields: Large amount of liquid blood with clots seen throughout the colon. Previous Boston resolution clip remained in place. Purple spot seen in the lateral aspect polypectomy site, 3 resolution clips placed here. 6 mm sessile ascending colon polyp seen and not manipulated.  . COLONOSCOPY  11/25/2008   Rourk: Blood-tinged colonic effluent, suspect recurrent post polypectomy bleeding status  post placement of 3 clips on the polypectomy site on top of the 3 clips that already existed, one had fallen off since last procedure. One small polyp distal to the cecum not manipulated  . COLONOSCOPY  12/06/2009   Rourk: Internal hemorrhoids, scattered left-sided diverticula, cecal polyps, one snared and subsequently clipped, 2 adjacent diminutive polyps ablated. tubular adenoma  . COLONOSCOPY N/A 01/21/2015   Dr. Gala Romney: 1.5 cm carpet-type polyp just distal to the ileocecal valve removed piecemeal fashion and ablated. Small polyp in the rectum. Pathology-tubular adenomas  . COLONOSCOPY N/A 09/01/2015   Procedure: COLONOSCOPY;  Surgeon: Daneil Dolin, MD;  [facility];  Service: Endoscopy;  Laterality: N/A;  845  . EYE SURGERY     bilateral  cataracts  . MENISCUS REPAIR     right knee  2000  . POLYPECTOMY  09/01/2015   Procedure: POLYPECTOMY;  Surgeon: Daneil Dolin, MD;  [facility];  Service: Endoscopy;;  colon  . TONSILLECTOMY Bilateral     There were no vitals filed for this visit.              Arm bike x 5 mins level 1 for conditioning, pt maintained 38-40RPM         PWR [facility]) - 12/04/15 1013    PWR! exercises Moves in sitting;Moves in supine   PWR! Up x10   PWR! Rock x10   PWR! Twist x10   PWR! Step x10   Basic 4 Flow  Comments mod v.c./ demonstration for large amplitude movements   PWR! Up x10   PWR! Rock x10   PWR! Twist --  attempted but discontinued due to pt difficulty, hx of back surgery   PWR! Step x10   Comments mod v.c.               OT Short Term Goals - 11/23/15 1544      OT SHORT TERM GOAL #1   Title Pt will be independent with updated PD-specific HEP.--check STGs 12/22/15   Time 4   Period Weeks   Status New     OT SHORT TERM GOAL #2   Title Pt will demo at least 120* L shoulder flex with -20* or greater elbow extension for improved overhead reaching.   Baseline 115* with -25* elbow ext   Time 4    Period Weeks   Status New     OT SHORT TERM GOAL #3   Title Pt will be able to button/unbutton 3 buttons on tabletop in 40 sec or less for incr ease with dressing.   Baseline 43.44   Time 4   Period Weeks   Status New     OT SHORT TERM GOAL #4   Title Pt will be able to write 3 sentences with 100% accuracy and no significant incr in size.   Time 4   Status New           OT Long Term Goals - 11/23/15 1545      OT LONG TERM GOAL #1   Title Pt will verbalize understanding of updated strategies/AE to incr ease with ADLs prn.--check LTGs 01/22/16   Time 8   Period Weeks   Status New     OT LONG TERM GOAL #2   Title Pt will improve bradykinesia, functional reaching, coordination for ADLs as shown by improving score on box and blocks test by at least 4 blocks bilaterally.   Baseline R-46 blocks, L-47 blocks   Status New     OT LONG TERM GOAL #3   Title Pt will improve ability to don/doff jacket as shown by improving time on PPT#4 (with gown) in 21sec or less.   Baseline 24.84sec    Time 8   Period Weeks   Status New     OT LONG TERM GOAL #4   Title Pt will be able to button/unbutton 3 buttons on tabletop in 35sec or less for incr ease with dressing.   Baseline 43.44sec   Time 8   Period Weeks   Status New     OT LONG TERM GOAL #5   Title Pt will improve bilateral standing functional reach by at least 3" for incr balance for IADLs.   Baseline R-5", L-4"   Time 8   Period Weeks   Status New     OT LONG TERM GOAL #6   Title -------------------------     OT LONG TERM GOAL #7   Title ----------------------               Plan - 12/04/15 1008    Clinical Impression Statement Pt is progressing towards goals for PD specific HEP.   Rehab Potential Good   OT Frequency 2x / week   OT Duration 8 weeks   OT Treatment/Interventions Self-care/ADL training;Moist Heat;Electrical Stimulation;Fluidtherapy;Contrast Bath;Passive range of motion;Therapeutic  activities;Cognitive remediation/compensation;DME and/or AE instruction;Parrafin;Cryotherapy;Therapeutic exercises;Manual Therapy;Neuromuscular education;Splinting;Ultrasound;Energy conservation;Therapeutic exercise;Functional Mobility Training;Patient/family education;Balance training   Plan PWR! hands, coordination HEP   OT Home Exercise  Plan Education issued:  seated PWR!, up, rock and step, supine PWR! basic 4   Consulted and Agree with Plan of Care Patient      Patient will benefit from skilled therapeutic intervention in order to improve the following deficits and impairments:  Decreased mobility, Impaired UE functional use, Decreased knowledge of use of DME, Decreased balance, Decreased coordination, Decreased range of motion, Impaired tone, Decreased cognition, Improper spinal/pelvic alignment, Decreased strength, Impaired flexibility, Decreased activity tolerance  Visit Diagnosis: Other symptoms and signs involving the nervous system  Other symptoms and signs involving the musculoskeletal system  Abnormal posture  Other lack of coordination  Stiffness of left elbow, not elsewhere classified  Stiffness of right elbow, not elsewhere classified  Stiffness of left shoulder, not elsewhere classified    Problem List Patient Active Problem List   Diagnosis Date Noted  . History of colonic polyps   . Hx of adenomatous colonic polyps 01/04/2015  . Lumbar canal stenosis 12/04/2013  . Aortic stenosis 10/21/2013  . Hypoxemia 04/16/2012  . Primary central sleep apnea 04/16/2012  . Obstructive sleep apnea (adult) (pediatric) 04/16/2012  . Orthostatic hypotension 04/16/2012  . Sleep disturbance, unspecified 04/16/2012  . Unspecified hereditary and idiopathic peripheral neuropathy 04/16/2012  . Parkinson's 04/16/2012  . COLONIC POLYPS, ADENOMATOUS 11/19/2008  . GERD 11/19/2008  . HEMATOCHEZIA 11/19/2008  . GI BLEEDING 11/19/2008  . HYPERLIPIDEMIA-MIXED 07/03/2008  .  HYPERTENSION, UNSPECIFIED 07/03/2008    Freddie Nghiem 12/04/2015, 10:19 AM Theone Murdoch, OTR/L [phone] Phone: [phone] 10:19 AM 11/18/17[facility] [address] [place], [place], [address] Phone: [phone]   Fax:  [phone]  Name: [name] MRN: [id number] Date of Birth: Apr 01, 1931

## 2015-12-13 ENCOUNTER — Ambulatory Visit: Payer: Medicare Other | Admitting: *Deleted

## 2015-12-13 ENCOUNTER — Ambulatory Visit: Payer: Medicare Other | Admitting: Occupational Therapy

## 2015-12-13 DIAGNOSIS — R29898 Other symptoms and signs involving the musculoskeletal system: Secondary | ICD-10-CM

## 2015-12-13 DIAGNOSIS — R293 Abnormal posture: Secondary | ICD-10-CM | POA: Diagnosis not present

## 2015-12-13 DIAGNOSIS — M25612 Stiffness of left shoulder, not elsewhere classified: Secondary | ICD-10-CM

## 2015-12-13 DIAGNOSIS — R471 Dysarthria and anarthria: Secondary | ICD-10-CM

## 2015-12-13 DIAGNOSIS — R278 Other lack of coordination: Secondary | ICD-10-CM | POA: Diagnosis not present

## 2015-12-13 DIAGNOSIS — R29818 Other symptoms and signs involving the nervous system: Secondary | ICD-10-CM

## 2015-12-13 DIAGNOSIS — M25621 Stiffness of right elbow, not elsewhere classified: Secondary | ICD-10-CM

## 2015-12-13 DIAGNOSIS — R2681 Unsteadiness on feet: Secondary | ICD-10-CM | POA: Diagnosis not present

## 2015-12-13 DIAGNOSIS — M25622 Stiffness of left elbow, not elsewhere classified: Secondary | ICD-10-CM

## 2015-12-13 DIAGNOSIS — R2689 Other abnormalities of gait and mobility: Secondary | ICD-10-CM | POA: Diagnosis not present

## 2015-12-13 NOTE — Therapy (Signed)
[facility] [address] [place], [place], [address] Phone: [phone]   Fax:  [phone]  Speech Language Pathology Treatment  Patient Details  Name: [name] MRN: [id number] Date of Birth: Aug 03, 1931 Referring Provider: Star Age, M.D.  Encounter Date: 12/13/2015      End of Session - 12/13/15 1442    Visit Number 3   Number of Visits 17   Date for SLP Re-Evaluation 02/04/16   SLP Start Time 1400   SLP Stop Time  1440   SLP Time Calculation (min) 40 min   Activity Tolerance Patient tolerated treatment well      Past Medical History:  Diagnosis Date  . Arthritis   . Cancer [facility])    h/o  skin  cancer  . Ex-smoker   . GERD (gastroesophageal reflux disease)   . Hypercholesteremia   . Hypertension   . Lumbar stenosis   . Parkinson's disease ([place])   . Sleep apnea    ?? sleep apnea....tested 2013 @ [facility]     . Sleep disorder     Past Surgical History:  Procedure Laterality Date  . APPENDECTOMY    . BACK SURGERY  11/2013  . COLONOSCOPY  11/17/2008   Rourk: Sessile polyp in the cecum status post saline-assisted piecemeal polypectomy, resolution clipping and epinephrine injection therapy performed. Shallow sigmoid diverticula.. Tubular adenoma  . COLONOSCOPY  11/19/2008   Fields: Large amount of liquid blood with clots seen throughout the colon. Previous [place] resolution clip remained in place. Purple spot seen in the lateral aspect polypectomy site, 3 resolution clips placed here. 6 mm sessile ascending colon polyp seen and not manipulated.  . COLONOSCOPY  11/25/2008   Rourk: Blood-tinged colonic effluent, suspect recurrent post polypectomy bleeding status post placement of 3 clips on the polypectomy site on top of the 3 clips that already existed, one had fallen off since last procedure. One small polyp distal to the cecum not manipulated  . COLONOSCOPY  12/06/2009   Rourk:  Internal hemorrhoids, scattered left-sided diverticula, cecal polyps, one snared and subsequently clipped, 2 adjacent diminutive polyps ablated. tubular adenoma  . COLONOSCOPY N/A 01/21/2015   Dr. Gala Romney: 1.5 cm carpet-type polyp just distal to the ileocecal valve removed piecemeal fashion and ablated. Small polyp in the rectum. Pathology-tubular adenomas  . COLONOSCOPY N/A 09/01/2015   Procedure: COLONOSCOPY;  Surgeon: Daneil Dolin, MD;  [facility];  Service: Endoscopy;  Laterality: N/A;  845  . EYE SURGERY     bilateral  cataracts  . MENISCUS REPAIR     right knee  2000  . POLYPECTOMY  09/01/2015   Procedure: POLYPECTOMY;  Surgeon: Daneil Dolin, MD;  [facility];  Service: Endoscopy;;  colon  . TONSILLECTOMY Bilateral     There were no vitals filed for this visit.      Subjective Assessment - 12/13/15 1415    Subjective "I'm shaky!."   Currently in Pain? No/denies               ADULT SLP TREATMENT - 12/13/15 1430      General Information   Behavior/Cognition Alert;Cooperative;Pleasant mood     Treatment Provided   Treatment provided Cognitive-Linquistic     Cognitive-Linquistic Treatment   Treatment focused on Dysarthria   Skilled Treatment Pt reports he has been completing "hey-ah" at home. Discussed hydration and practical ways to increase water consumption. Pt with plan to increase water intake with medication administration. This  date /a/ avg in low 80's and structured phrase to sentence level tasks completed in the mid-70s with intermittent visual cueing.      Assessment / Recommendations / Plan   Plan Continue with current plan of care     Progression Toward Goals   Progression toward goals Progressing toward goals            SLP Short Term Goals - 12/13/15 1444      SLP SHORT TERM GOAL #1   Title pt will phonate loud /a/ average 88dB for 3 sessions   Time 3   Period Weeks   Status On-going     SLP SHORT TERM GOAL #2    Title pt will produce sentences with average 71dB with WNL voicing   Time 3   Period Weeks   Status On-going     SLP SHORT TERM GOAL #3   Title pt will maintain loudness at average 70dB with WNL voicing in 5 minutes simple conversation   Time 3   Period Weeks   Status On-going     SLP SHORT TERM GOAL #4   Title pt will demo abdominal breathing in 5 minutes simple conversation 75% of the time over three sessions   Time 3   Period Weeks   Status On-going          SLP Long Term Goals - 12/13/15 1444      SLP LONG TERM GOAL #1   Title pt will maintain loud /a/ with average 88dB in 6 therapy sessions   Time 7   Period Weeks   Status On-going     SLP LONG TERM GOAL #2   Title pt will use functional voicing in 10 minutes of simple conversation and maintain average 70dB over three sessions   Time 7   Period Weeks   Status On-going     SLP LONG TERM GOAL #3   Title pt will demo abdominal breathing in 5 minutes mod complex conversation 50% of the time in three sessions   Time 7   Period Weeks   Status On-going          Plan - 12/13/15 1443    Clinical Impression Statement Formulated list of phrases for practice at home as well as current activities that would allow for intentional practice with increased vocal intensity. Pt would benefit from skilled ST to improve his ability to communicate at home and in the community.   Speech Therapy Frequency 2x / week   Treatment/Interventions Compensatory strategies;Functional tasks;Cueing hierarchy;Patient/family education;Multimodal communcation approach;Language facilitation;SLP instruction and feedback;Internal/external aids   Potential to Achieve Goals Good   Consulted and Agree with Plan of Care Patient      Patient will benefit from skilled therapeutic intervention in order to improve the following deficits and impairments:   Dysarthria and anarthria    Problem List Patient Active Problem List   Diagnosis Date Noted  .  History of colonic polyps   . Hx of adenomatous colonic polyps 01/04/2015  . Lumbar canal stenosis 12/04/2013  . Aortic stenosis 10/21/2013  . Hypoxemia 04/16/2012  . Primary central sleep apnea 04/16/2012  . Obstructive sleep apnea (adult) (pediatric) 04/16/2012  . Orthostatic hypotension 04/16/2012  . Sleep disturbance, unspecified 04/16/2012  . Unspecified hereditary and idiopathic peripheral neuropathy 04/16/2012  . Parkinson's 04/16/2012  . COLONIC POLYPS, ADENOMATOUS 11/19/2008  . GERD 11/19/2008  . HEMATOCHEZIA 11/19/2008  . GI BLEEDING 11/19/2008  . HYPERLIPIDEMIA-MIXED 07/03/2008  . HYPERTENSION, UNSPECIFIED 07/03/2008  Vinetta Bergamo MA, CCC-SLP 12/13/2015, 2:45 PM  [facility] [address] [place], [place], [address] Phone: [phone]   Fax:  [phone]   Name: [name] MRN: [id number] Date of Birth: Oct 29, 1931

## 2015-12-13 NOTE — Patient Instructions (Addendum)
PWR! Hands  With arms stretched out in front of you (elbows straight), perform the following:  PWR! Rock: Move wrists up and down [employer]! Twist: Twist palms up and down BIG  Then, start with elbows bent and hands closed.  PWR! Step: Touch index finger to thumb while keeping other fingers straight. Flick fingers out BIG (thumb out/straighten fingers). Repeat with other fingers. (Step your thumb to each finger). 5-10x each finger  PWR! Hands: Push hands out BIG. Elbows straight, wrists up, fingers open and spread apart BIG. (Can also perform by pushing down on table, chair, knees. Push above head, out to the side, behind you, in front of you.)   ** Make each movement big and deliberate so that you feel the movement.  Perform at least 10 repetitions 1x/day, but perform PWR! hands throughout the day when you are having trouble using your hands (picking up/manipulating small objects, writing, eating, typing, sewing, buttoning, etc.).    Coordination Exercises  Perform the following exercises for 20 minutes 1 times per day. Perform with both hand(s). Perform using big movements.   Flipping Cards: Place deck of cards on the table. Flip cards over by opening your hand big to grasp and then turn your palm up big, opening hand fully to release.  Deal cards: Hold 1/2 or whole deck in your hand. Use thumb to push card off top of deck with one big push.  Place card on tabletop. Then flick fingers (extend fingers) powerfully to slide card off table (can have chair/box below table to catch the cards).  Rotate ball with fingertips: Pick up with fingers/thumb and move as much as you can with each turn/movement (clockwise and counter-clockwise).  Toss ball from one hand to the other: Toss big/high.  Deliberately open with toss and deliberately close hand after catch.  Toss ball in the air and catch with the same hand: Toss big/high.  Deliberately open with toss and deliberately close hand after  catch.  Rotate 2 golf balls in your hand: Both directions.  Open hand then Pick up coins and stack one at a time: Pick up with big, intentional movements opening your had before picking up each coin. Do not drag coin to the edge. (5-10 in a stack)  Pick up 5-10 coins one at a time and hold in palm. Then, move coins from palm to fingertips one at time and place in coin bank/container.  Practice writing: Slow down, write big, and focus on forming each letter.  Fasten nuts/bolts or put on bottle caps: Turn as much/as big as you can with each turn.  Move wrist.

## 2015-12-13 NOTE — Therapy (Signed)
[facility] [address] [place], [place], [address] Phone: [phone]   Fax:  [phone]  Occupational Therapy Treatment  Patient Details  Name: [name] MRN: [id number] Date of Birth: 1931-04-20 Referring Provider: Dr. Star Age  Encounter Date: 12/13/2015      OT End of Session - 12/13/15 1347    Visit Number 3   Number of Visits 17   Date for OT Re-Evaluation 01/22/16   Authorization Type Medicare/Aetna (G-code needed)  **KX**   Authorization Time Period cert. 11/23/15-01/22/16   Authorization - Visit Number 3   Authorization - Number of Visits 10   OT Start Time 1319   OT Stop Time 1400   OT Time Calculation (min) 41 min   Activity Tolerance Patient tolerated treatment well   Behavior During Therapy WFL for tasks assessed/performed      Past Medical History:  Diagnosis Date  . Arthritis   . Cancer [facility])    h/o  skin  cancer  . Ex-smoker   . GERD (gastroesophageal reflux disease)   . Hypercholesteremia   . Hypertension   . Lumbar stenosis   . Parkinson's disease ([place])   . Sleep apnea    ?? sleep apnea....tested 2013 @ [facility]     . Sleep disorder     Past Surgical History:  Procedure Laterality Date  . APPENDECTOMY    . BACK SURGERY  11/2013  . COLONOSCOPY  11/17/2008   Rourk: Sessile polyp in the cecum status post saline-assisted piecemeal polypectomy, resolution clipping and epinephrine injection therapy performed. Shallow sigmoid diverticula.. Tubular adenoma  . COLONOSCOPY  11/19/2008   Fields: Large amount of liquid blood with clots seen throughout the colon. Previous [place] resolution clip remained in place. Purple spot seen in the lateral aspect polypectomy site, 3 resolution clips placed here. 6 mm sessile ascending colon polyp seen and not manipulated.  . COLONOSCOPY  11/25/2008   Rourk: Blood-tinged colonic effluent, suspect recurrent post polypectomy bleeding  status post placement of 3 clips on the polypectomy site on top of the 3 clips that already existed, one had fallen off since last procedure. One small polyp distal to the cecum not manipulated  . COLONOSCOPY  12/06/2009   Rourk: Internal hemorrhoids, scattered left-sided diverticula, cecal polyps, one snared and subsequently clipped, 2 adjacent diminutive polyps ablated. tubular adenoma  . COLONOSCOPY N/A 01/21/2015   Dr. Gala Romney: 1.5 cm carpet-type polyp just distal to the ileocecal valve removed piecemeal fashion and ablated. Small polyp in the rectum. Pathology-tubular adenomas  . COLONOSCOPY N/A 09/01/2015   Procedure: COLONOSCOPY;  Surgeon: Daneil Dolin, MD;  [facility];  Service: Endoscopy;  Laterality: N/A;  845  . EYE SURGERY     bilateral  cataracts  . MENISCUS REPAIR     right knee  2000  . POLYPECTOMY  09/01/2015   Procedure: POLYPECTOMY;  Surgeon: Daneil Dolin, MD;  [facility];  Service: Endoscopy;;  colon  . TONSILLECTOMY Bilateral     There were no vitals filed for this visit.      Subjective Assessment - 12/13/15 1321    Subjective  "I'm slower"   Pertinent History PD diagnosis approx 6 years ago; hx of low back surgery 11/2013; hx of bilateral RTC injuries; HTN, othostatic hypotension, sleep apnea, lumbar stenosis   Patient Stated Goals improving donning coat, improve fine motor skills   Currently in Pain? No/denies  OT Education - 12/13/15 1337    Education Details PWR! hands; coordination HEP with focus on large amplitude movements--see pt instructions   Person(s) Educated Patient   Methods Explanation;Handout;Demonstration;Verbal cues   Comprehension Verbalized understanding;Returned demonstration;Verbal cues required  min-mod cueing for incr movement amplitude          OT Short Term Goals - 11/23/15 1544      OT SHORT TERM GOAL #1   Title Pt will be independent with updated  PD-specific HEP.--check STGs 12/22/15   Time 4   Period Weeks   Status New     OT SHORT TERM GOAL #2   Title Pt will demo at least 120* L shoulder flex with -20* or greater elbow extension for improved overhead reaching.   Baseline 115* with -25* elbow ext   Time 4   Period Weeks   Status New     OT SHORT TERM GOAL #3   Title Pt will be able to button/unbutton 3 buttons on tabletop in 40 sec or less for incr ease with dressing.   Baseline 43.44   Time 4   Period Weeks   Status New     OT SHORT TERM GOAL #4   Title Pt will be able to write 3 sentences with 100% accuracy and no significant incr in size.   Time 4   Status New           OT Long Term Goals - 11/23/15 1545      OT LONG TERM GOAL #1   Title Pt will verbalize understanding of updated strategies/AE to incr ease with ADLs prn.--check LTGs 01/22/16   Time 8   Period Weeks   Status New     OT LONG TERM GOAL #2   Title Pt will improve bradykinesia, functional reaching, coordination for ADLs as shown by improving score on box and blocks test by at least 4 blocks bilaterally.   Baseline R-46 blocks, L-47 blocks   Status New     OT LONG TERM GOAL #3   Title Pt will improve ability to don/doff jacket as shown by improving time on PPT#4 (with gown) in 21sec or less.   Baseline 24.84sec    Time 8   Period Weeks   Status New     OT LONG TERM GOAL #4   Title Pt will be able to button/unbutton 3 buttons on tabletop in 35sec or less for incr ease with dressing.   Baseline 43.44sec   Time 8   Period Weeks   Status New     OT LONG TERM GOAL #5   Title Pt will improve bilateral standing functional reach by at least 3" for incr balance for IADLs.   Baseline R-5", L-4"   Time 8   Period Weeks   Status New     OT LONG TERM GOAL #6   Title -------------------------     OT LONG TERM GOAL #7   Title ----------------------               Plan - 12/13/15 1340    Clinical Impression Statement Pt is progressing  towards goals for PD-specific HEP, but needs min-mod cueing at times for incr movement amplitude.   Rehab Potential Good   OT Frequency 2x / week   OT Duration 8 weeks   OT Treatment/Interventions Self-care/ADL training;Moist Heat;Electrical Stimulation;Fluidtherapy;Contrast Bath;Passive range of motion;Therapeutic activities;Cognitive remediation/compensation;DME and/or AE instruction;Parrafin;Cryotherapy;Therapeutic exercises;Manual Therapy;Neuromuscular education;Splinting;Ultrasound;Energy conservation;Therapeutic exercise;Functional Mobility Training;Patient/family education;Balance training   OT Home Exercise Plan  Education issued:  seated PWR!, up, rock and step, supine PWR! basic 4; PWR! hands and coordination HEP   Consulted and Agree with Plan of Care Patient      Patient will benefit from skilled therapeutic intervention in order to improve the following deficits and impairments:  Decreased mobility, Impaired UE functional use, Decreased knowledge of use of DME, Decreased balance, Decreased coordination, Decreased range of motion, Impaired tone, Decreased cognition, Improper spinal/pelvic alignment, Decreased strength, Impaired flexibility, Decreased activity tolerance  Visit Diagnosis: Other symptoms and signs involving the nervous system  Other symptoms and signs involving the musculoskeletal system  Abnormal posture  Other lack of coordination  Stiffness of left elbow, not elsewhere classified  Stiffness of right elbow, not elsewhere classified  Stiffness of left shoulder, not elsewhere classified    Problem List Patient Active Problem List   Diagnosis Date Noted  . History of colonic polyps   . Hx of adenomatous colonic polyps 01/04/2015  . Lumbar canal stenosis 12/04/2013  . Aortic stenosis 10/21/2013  . Hypoxemia 04/16/2012  . Primary central sleep apnea 04/16/2012  . Obstructive sleep apnea (adult) (pediatric) 04/16/2012  . Orthostatic hypotension 04/16/2012   . Sleep disturbance, unspecified 04/16/2012  . Unspecified hereditary and idiopathic peripheral neuropathy 04/16/2012  . Parkinson's 04/16/2012  . COLONIC POLYPS, ADENOMATOUS 11/19/2008  . GERD 11/19/2008  . HEMATOCHEZIA 11/19/2008  . GI BLEEDING 11/19/2008  . HYPERLIPIDEMIA-MIXED 07/03/2008  . HYPERTENSION, UNSPECIFIED 07/03/2008    [facility] 12/13/2015, 1:57 PM  [place] [address] [place], [place], [address] Phone: [phone]   Fax:  [phone]  Name: [name] MRN: [id number] Date of Birth: Oct 19, 1931   Vianne Bulls, OTR/L [facility] [address] [place], [place]  [address] [phone] phone [phone] 12/13/15 1:57 PM

## 2015-12-21 ENCOUNTER — Ambulatory Visit: Payer: Medicare Other | Admitting: Occupational Therapy

## 2015-12-21 ENCOUNTER — Ambulatory Visit: Payer: Medicare Other | Admitting: *Deleted

## 2015-12-22 DIAGNOSIS — R3915 Urgency of urination: Secondary | ICD-10-CM | POA: Diagnosis not present

## 2015-12-22 DIAGNOSIS — N3941 Urge incontinence: Secondary | ICD-10-CM | POA: Diagnosis not present

## 2015-12-24 ENCOUNTER — Ambulatory Visit: Payer: Medicare Other | Attending: Neurology | Admitting: Occupational Therapy

## 2015-12-24 ENCOUNTER — Ambulatory Visit: Payer: Medicare Other

## 2015-12-24 DIAGNOSIS — R2681 Unsteadiness on feet: Secondary | ICD-10-CM | POA: Diagnosis not present

## 2015-12-24 DIAGNOSIS — M25622 Stiffness of left elbow, not elsewhere classified: Secondary | ICD-10-CM | POA: Insufficient documentation

## 2015-12-24 DIAGNOSIS — R1310 Dysphagia, unspecified: Secondary | ICD-10-CM | POA: Diagnosis not present

## 2015-12-24 DIAGNOSIS — R278 Other lack of coordination: Secondary | ICD-10-CM | POA: Insufficient documentation

## 2015-12-24 DIAGNOSIS — R471 Dysarthria and anarthria: Secondary | ICD-10-CM

## 2015-12-24 DIAGNOSIS — R29898 Other symptoms and signs involving the musculoskeletal system: Secondary | ICD-10-CM | POA: Diagnosis not present

## 2015-12-24 DIAGNOSIS — R293 Abnormal posture: Secondary | ICD-10-CM | POA: Diagnosis not present

## 2015-12-24 DIAGNOSIS — M25621 Stiffness of right elbow, not elsewhere classified: Secondary | ICD-10-CM | POA: Insufficient documentation

## 2015-12-24 DIAGNOSIS — M25612 Stiffness of left shoulder, not elsewhere classified: Secondary | ICD-10-CM

## 2015-12-24 DIAGNOSIS — R29818 Other symptoms and signs involving the nervous system: Secondary | ICD-10-CM | POA: Diagnosis not present

## 2015-12-24 NOTE — Therapy (Signed)
[name] [address] [place], [place], [address] Phone: [phone]   Fax:  [phone]  Occupational Therapy Treatment  Patient Details  Name: [name] MRN: [id number] Date of Birth: August 25, 1931 Referring Provider: Dr. Star Age  Encounter Date: 12/24/2015      OT End of Session - 12/24/15 1559    Visit Number 4   Number of Visits 17   Date for OT Re-Evaluation 01/22/16   Authorization Type Medicare/Aetna (G-code needed)  **KX**   Authorization Time Period cert. 11/23/15-01/22/16   Authorization - Visit Number 4   Authorization - Number of Visits 10   OT Start Time 1320   OT Stop Time 1400   OT Time Calculation (min) 40 min   Activity Tolerance Patient tolerated treatment well   Behavior During Therapy WFL for tasks assessed/performed      Past Medical History:  Diagnosis Date  . Arthritis   . Cancer [facility])    h/o  skin  cancer  . Ex-smoker   . GERD (gastroesophageal reflux disease)   . Hypercholesteremia   . Hypertension   . Lumbar stenosis   . Parkinson's disease ([place])   . Sleep apnea    ?? sleep apnea....tested 2013 @ [facility]     . Sleep disorder     Past Surgical History:  Procedure Laterality Date  . APPENDECTOMY    . BACK SURGERY  11/2013  . COLONOSCOPY  11/17/2008   Rourk: Sessile polyp in the cecum status post saline-assisted piecemeal polypectomy, resolution clipping and epinephrine injection therapy performed. Shallow sigmoid diverticula.. Tubular adenoma  . COLONOSCOPY  11/19/2008   Fields: Large amount of liquid blood with clots seen throughout the colon. Previous [place] resolution clip remained in place. Purple spot seen in the lateral aspect polypectomy site, 3 resolution clips placed here. 6 mm sessile ascending colon polyp seen and not manipulated.  . COLONOSCOPY  11/25/2008   Rourk: Blood-tinged colonic effluent, suspect recurrent post polypectomy bleeding  status post placement of 3 clips on the polypectomy site on top of the 3 clips that already existed, one had fallen off since last procedure. One small polyp distal to the cecum not manipulated  . COLONOSCOPY  12/06/2009   Rourk: Internal hemorrhoids, scattered left-sided diverticula, cecal polyps, one snared and subsequently clipped, 2 adjacent diminutive polyps ablated. tubular adenoma  . COLONOSCOPY N/A 01/21/2015   Dr. Gala Romney: 1.5 cm carpet-type polyp just distal to the ileocecal valve removed piecemeal fashion and ablated. Small polyp in the rectum. Pathology-tubular adenomas  . COLONOSCOPY N/A 09/01/2015   Procedure: COLONOSCOPY;  Surgeon: Daneil Dolin, MD;  [facility];  Service: Endoscopy;  Laterality: N/A;  845  . EYE SURGERY     bilateral  cataracts  . MENISCUS REPAIR     right knee  2000  . POLYPECTOMY  09/01/2015   Procedure: POLYPECTOMY;  Surgeon: Daneil Dolin, MD;  [facility];  Service: Endoscopy;;  colon  . TONSILLECTOMY Bilateral     There were no vitals filed for this visit.      Subjective Assessment - 12/24/15 1604    Subjective  Denies pain   Pertinent History PD diagnosis approx 6 years ago; hx of low back surgery 11/2013; hx of bilateral RTC injuries; HTN, othostatic hypotension, sleep apnea, lumbar stenosis   Patient Stated Goals improving donning coat, improve fine motor skills   Currently in Pain? No/denies      Treatment: PWR! moves  in standing, mod v.c. For correct sequence and larger amplitude movements PWR! Rock for modified quadraped at table x 10 , mod v.c. And demonstration. Handwriting strategies and techniques, 90-95% legibility, pt needs cues to slow down. Standing dynamic step and reach to place large pegs in pegboard with right and left UE's, mod v.c. For larger amplitude movements. Dynamic step and reach to toss scarves to targets, with right and left UE, min -mod v.c.                          OT  Short Term Goals - 12/24/15 1357      OT SHORT TERM GOAL #1   Title Pt will be independent with updated PD-specific HEP.--check STGs 12/22/15   Time 4   Period Weeks   Status New     OT SHORT TERM GOAL #2   Title Pt will demo at least 120* L shoulder flex with -20* or greater elbow extension for improved overhead reaching.   Baseline 115* with -25* elbow ext   Time 4   Period Weeks   Status New     OT SHORT TERM GOAL #3   Title Pt will be able to button/unbutton 3 buttons on tabletop in 40 sec or less for incr ease with dressing.   Baseline 43.44   Time 4   Period Weeks   Status New     OT SHORT TERM GOAL #4   Title Pt will be able to write 3 sentences with 100% accuracy and no significant incr in size.   Time 4   Status New           OT Long Term Goals - 11/23/15 1545      OT LONG TERM GOAL #1   Title Pt will verbalize understanding of updated strategies/AE to incr ease with ADLs prn.--check LTGs 01/22/16   Time 8   Period Weeks   Status New     OT LONG TERM GOAL #2   Title Pt will improve bradykinesia, functional reaching, coordination for ADLs as shown by improving score on box and blocks test by at least 4 blocks bilaterally.   Baseline R-46 blocks, L-47 blocks   Status New     OT LONG TERM GOAL #3   Title Pt will improve ability to don/doff jacket as shown by improving time on PPT#4 (with gown) in 21sec or less.   Baseline 24.84sec    Time 8   Period Weeks   Status New     OT LONG TERM GOAL #4   Title Pt will be able to button/unbutton 3 buttons on tabletop in 35sec or less for incr ease with dressing.   Baseline 43.44sec   Time 8   Period Weeks   Status New     OT LONG TERM GOAL #5   Title Pt will improve bilateral standing functional reach by at least 3" for incr balance for IADLs.   Baseline R-5", L-4"   Time 8   Period Weeks   Status New     OT LONG TERM GOAL #6   Title -------------------------     OT LONG TERM GOAL #7   Title  ----------------------               Plan - 12/24/15 1554    Clinical Impression Statement Pt is progressing towards goals. He can benefit from repetition of large amplitude movements   Rehab Potential Good   OT Frequency  2x / week   OT Duration 8 weeks   OT Treatment/Interventions Self-care/ADL training;Moist Heat;Electrical Stimulation;Fluidtherapy;Contrast Bath;Passive range of motion;Therapeutic activities;Cognitive remediation/compensation;DME and/or AE instruction;Parrafin;Cryotherapy;Therapeutic exercises;Manual Therapy;Neuromuscular education;Splinting;Ultrasound;Energy conservation;Therapeutic exercise;Functional Mobility Training;Patient/family education;Balance training   Plan continue to work towards unmet goals   OT Home Exercise Plan Education issued:  seated PWR!, up, rock and step, supine PWR! basic 4; PWR! hands and coordination HEP   Consulted and Agree with Plan of Care Patient      Patient will benefit from skilled therapeutic intervention in order to improve the following deficits and impairments:  Decreased mobility, Impaired UE functional use, Decreased knowledge of use of DME, Decreased balance, Decreased coordination, Decreased range of motion, Impaired tone, Decreased cognition, Improper spinal/pelvic alignment, Decreased strength, Impaired flexibility, Decreased activity tolerance  Visit Diagnosis: Other symptoms and signs involving the nervous system  Other symptoms and signs involving the musculoskeletal system  Abnormal posture  Other lack of coordination  Stiffness of left elbow, not elsewhere classified  Stiffness of left shoulder, not elsewhere classified  Stiffness of right elbow, not elsewhere classified    Problem List Patient Active Problem List   Diagnosis Date Noted  . History of colonic polyps   . Hx of adenomatous colonic polyps 01/04/2015  . Lumbar canal stenosis 12/04/2013  . Aortic stenosis 10/21/2013  . Hypoxemia 04/16/2012   . Primary central sleep apnea 04/16/2012  . Obstructive sleep apnea (adult) (pediatric) 04/16/2012  . Orthostatic hypotension 04/16/2012  . Sleep disturbance, unspecified 04/16/2012  . Unspecified hereditary and idiopathic peripheral neuropathy 04/16/2012  . Parkinson's 04/16/2012  . COLONIC POLYPS, ADENOMATOUS 11/19/2008  . GERD 11/19/2008  . HEMATOCHEZIA 11/19/2008  . GI BLEEDING 11/19/2008  . HYPERLIPIDEMIA-MIXED 07/03/2008  . HYPERTENSION, UNSPECIFIED 07/03/2008    Kabao Leite 12/24/2015, 4:05 PM  [place] [address] [place], [place], [address] Phone: [phone]   Fax:  [phone]  Name: [name] MRN: [id number] Date of Birth: [birth year]

## 2015-12-24 NOTE — Therapy (Signed)
[facility] [address] [place], [place], [address] Phone: [phone]   Fax:  [phone]  Speech Language Pathology Treatment  Patient Details  Name: [name] MRN: [id number] Date of Birth: 01/09/1932 Referring Provider: Star Age, M.D.  Encounter Date: 12/24/2015      End of Session - 12/24/15 1458    Visit Number 4   Number of Visits 17   Date for SLP Re-Evaluation 02/04/16   SLP Start Time [id number]   SLP Stop Time  1446   SLP Time Calculation (min) 41 min   Activity Tolerance Patient tolerated treatment well      Past Medical History:  Diagnosis Date  . Arthritis   . Cancer [facility])    h/o  skin  cancer  . Ex-smoker   . GERD (gastroesophageal reflux disease)   . Hypercholesteremia   . Hypertension   . Lumbar stenosis   . Parkinson's disease ([place])   . Sleep apnea    ?? sleep apnea....tested 2013 @ [facility]     . Sleep disorder     Past Surgical History:  Procedure Laterality Date  . APPENDECTOMY    . BACK SURGERY  11/2013  . COLONOSCOPY  11/17/2008   Rourk: Sessile polyp in the cecum status post saline-assisted piecemeal polypectomy, resolution clipping and epinephrine injection therapy performed. Shallow sigmoid diverticula.. Tubular adenoma  . COLONOSCOPY  11/19/2008   Fields: Large amount of liquid blood with clots seen throughout the colon. Previous Boston resolution clip remained in place. Purple spot seen in the lateral aspect polypectomy site, 3 resolution clips placed here. 6 mm sessile ascending colon polyp seen and not manipulated.  . COLONOSCOPY  11/25/2008   Rourk: Blood-tinged colonic effluent, suspect recurrent post polypectomy bleeding status post placement of 3 clips on the polypectomy site on top of the 3 clips that already existed, one had fallen off since last procedure. One small polyp distal to the cecum not manipulated  . COLONOSCOPY  12/06/2009   Rourk:  Internal hemorrhoids, scattered left-sided diverticula, cecal polyps, one snared and subsequently clipped, 2 adjacent diminutive polyps ablated. tubular adenoma  . COLONOSCOPY N/A 01/21/2015   Dr. Gala Romney: 1.5 cm carpet-type polyp just distal to the ileocecal valve removed piecemeal fashion and ablated. Small polyp in the rectum. Pathology-tubular adenomas  . COLONOSCOPY N/A 09/01/2015   Procedure: COLONOSCOPY;  Surgeon: Daneil Dolin, MD;  [facility];  Service: Endoscopy;  Laterality: N/A;  845  . EYE SURGERY     bilateral  cataracts  . MENISCUS REPAIR     right knee  2000  . POLYPECTOMY  09/01/2015   Procedure: POLYPECTOMY;  Surgeon: Daneil Dolin, MD;  Location[facility];  Service: Endoscopy;;  colon  . TONSILLECTOMY Bilateral     There were no vitals filed for this visit.      Subjective Assessment - 12/24/15 1408    Subjective "I think (my speech) has been doing pretty good." Pt reports his wife is not telling him to repeat as much as previously.    Currently in Pain? No/denies               ADULT SLP TREATMENT - 12/24/15 1409      General Information   Behavior/Cognition --     Treatment Provided   Treatment provided Cognitive-Linquistic     Cognitive-Linquistic Treatment   Treatment focused on Dysarthria   Skilled Treatment SLP facilitated louder speech today with pt's "hey-ahhhh"  with consistent min-mod A for loudness.- average dBs in low 80s. In sentence response tasks pt maintained normal volume (>70dB) with initial cues for loudness, 88% of the time overall. In multi-sentence responses pt req'd min A rarely to maintain loudness at WNL levels, adn in simple conversation pt maintained loudness between 70-72 dB 75% of the time.     Assessment / Recommendations / Plan   Plan Continue with current plan of care     Progression Toward Goals   Progression toward goals Progressing toward goals            SLP Short Term Goals - 12/24/15 1500       SLP SHORT TERM GOAL #1   Title pt will phonate loud /a/ average 88dB for 3 sessions   Time 2   Period Weeks   Status On-going     SLP SHORT TERM GOAL #2   Title pt will produce sentences with average 71dB with WNL voicing   Status Achieved     SLP SHORT TERM GOAL #3   Title pt will maintain loudness at average 70dB with WNL voicing in 5 minutes simple conversation   Time 2   Period Weeks   Status On-going     SLP SHORT TERM GOAL #4   Title pt will demo abdominal breathing in 5 minutes simple conversation 75% of the time over three sessions   Time 2   Period Weeks   Status On-going          SLP Long Term Goals - 12/24/15 1501      SLP LONG TERM GOAL #1   Title pt will maintain loud /a/ with average 88dB in 6 therapy sessions   Time 6   Period Weeks   Status On-going     SLP LONG TERM GOAL #2   Title pt will use functional voicing in 10 minutes of simple conversation and maintain average 70dB over three sessions   Time 6   Period Weeks   Status On-going     SLP LONG TERM GOAL #3   Title pt will demo abdominal breathing in 5 minutes mod complex conversation 50% of the time in three sessions   Time 6   Period Weeks   Status On-going          Plan - 12/24/15 1459    Clinical Impression Statement Pt's success at louder/WNL speech loudness in conversation has improved since last session, and pt was more consistent with louder speech with sentence responses. However, in non-therapeutic tasks (conversation outside [place] tasks) pt's volume decr'd to sub 70dB. Skilled ST to cont to address carryover of louder WNL speech to multiple contexts and conversational partners.   Speech Therapy Frequency 2x / week   Duration --  8 weeks, but pt only choosing to schedule x1/week   Treatment/Interventions Compensatory strategies;Functional tasks;Cueing hierarchy;Patient/family education;Multimodal communcation approach;Language facilitation;SLP instruction and  feedback;Internal/external aids   Potential to Achieve Goals Good   Consulted and Agree with Plan of Care Patient      Patient will benefit from skilled therapeutic intervention in order to improve the following deficits and impairments:   Dysarthria and anarthria  Dysphagia, unspecified type    Problem List Patient Active Problem List   Diagnosis Date Noted  . History of colonic polyps   . Hx of adenomatous colonic polyps 01/04/2015  . Lumbar canal stenosis 12/04/2013  . Aortic stenosis 10/21/2013  . Hypoxemia 04/16/2012  . Primary central sleep apnea 04/16/2012  .  Obstructive sleep apnea (adult) (pediatric) 04/16/2012  . Orthostatic hypotension 04/16/2012  . Sleep disturbance, unspecified 04/16/2012  . Unspecified hereditary and idiopathic peripheral neuropathy 04/16/2012  . Parkinson's 04/16/2012  . COLONIC POLYPS, ADENOMATOUS 11/19/2008  . GERD 11/19/2008  . HEMATOCHEZIA 11/19/2008  . GI BLEEDING 11/19/2008  . HYPERLIPIDEMIA-MIXED 07/03/2008  . HYPERTENSION, UNSPECIFIED 07/03/2008    [facility] ,[place], CCC-SLP  12/24/2015, 3:03 PM  [facility] [address] [place], [place], [address] Phone: [phone]   Fax:  [phone]   Name: MAGDIEL NUTTING MRN: [id number] Date of Birth: [birth year]

## 2015-12-24 NOTE — Patient Instructions (Signed)
  Please complete the assigned speech therapy homework and return it to your next session.  

## 2015-12-27 ENCOUNTER — Telehealth: Payer: Self-pay

## 2015-12-27 DIAGNOSIS — H401131 Primary open-angle glaucoma, bilateral, mild stage: Secondary | ICD-10-CM | POA: Diagnosis not present

## 2015-12-27 NOTE — Telephone Encounter (Signed)
I called pt and asked him to please bring his bipap chip to his appt tomorrow with Dr. Rexene Alberts. Pt verbalized understanding.

## 2015-12-28 ENCOUNTER — Ambulatory Visit: Payer: Medicare Other | Admitting: Occupational Therapy

## 2015-12-28 ENCOUNTER — Encounter: Payer: Self-pay | Admitting: Neurology

## 2015-12-28 ENCOUNTER — Ambulatory Visit (INDEPENDENT_AMBULATORY_CARE_PROVIDER_SITE_OTHER): Payer: Medicare Other | Admitting: Neurology

## 2015-12-28 ENCOUNTER — Ambulatory Visit: Payer: Medicare Other

## 2015-12-28 DIAGNOSIS — G20 Parkinson's disease: Secondary | ICD-10-CM | POA: Diagnosis not present

## 2015-12-28 DIAGNOSIS — R29898 Other symptoms and signs involving the musculoskeletal system: Secondary | ICD-10-CM | POA: Diagnosis not present

## 2015-12-28 DIAGNOSIS — R29818 Other symptoms and signs involving the nervous system: Secondary | ICD-10-CM | POA: Diagnosis not present

## 2015-12-28 DIAGNOSIS — R293 Abnormal posture: Secondary | ICD-10-CM

## 2015-12-28 DIAGNOSIS — M25612 Stiffness of left shoulder, not elsewhere classified: Secondary | ICD-10-CM | POA: Diagnosis not present

## 2015-12-28 DIAGNOSIS — M25622 Stiffness of left elbow, not elsewhere classified: Secondary | ICD-10-CM | POA: Diagnosis not present

## 2015-12-28 DIAGNOSIS — R471 Dysarthria and anarthria: Secondary | ICD-10-CM

## 2015-12-28 DIAGNOSIS — R1310 Dysphagia, unspecified: Secondary | ICD-10-CM

## 2015-12-28 DIAGNOSIS — M25621 Stiffness of right elbow, not elsewhere classified: Secondary | ICD-10-CM

## 2015-12-28 DIAGNOSIS — R278 Other lack of coordination: Secondary | ICD-10-CM

## 2015-12-28 MED ORDER — CARBIDOPA-LEVODOPA 25-100 MG PO TABS: ORAL_TABLET | ORAL | 3 refills | Status: DC

## 2015-12-28 MED ORDER — PRAMIPEXOLE DIHYDROCHLORIDE 0.5 MG PO TABS: 0.5000 mg | ORAL_TABLET | Freq: Three times a day (TID) | ORAL | 3 refills | Status: DC

## 2015-12-28 NOTE — Patient Instructions (Signed)
[email or web address]

## 2015-12-28 NOTE — Therapy (Signed)
[facility] [address] [place], [place], [address] Phone: [phone]   Fax:  [phone]  Speech Language Pathology Treatment  Patient Details  Name: [name] MRN: [id number] Date of Birth: 03/06/1931 Referring Provider: Star Age, M.D.  Encounter Date: 12/28/2015      End of Session - 12/28/15 1411    Visit Number 5   Number of Visits 17   Date for SLP Re-Evaluation 02/04/16   SLP Start Time 1319   SLP Stop Time  1401   SLP Time Calculation (min) 42 min   Activity Tolerance Patient tolerated treatment well      Past Medical History:  Diagnosis Date  . Arthritis   . Cancer [facility])    h/o  skin  cancer  . Ex-smoker   . GERD (gastroesophageal reflux disease)   . Hypercholesteremia   . Hypertension   . Lumbar stenosis   . Parkinson's disease ([place])   . Sleep apnea    ?? sleep apnea....tested 2013 @ [facility]     . Sleep disorder     Past Surgical History:  Procedure Laterality Date  . APPENDECTOMY    . BACK SURGERY  11/2013  . COLONOSCOPY  11/17/2008   Rourk: Sessile polyp in the cecum status post saline-assisted piecemeal polypectomy, resolution clipping and epinephrine injection therapy performed. Shallow sigmoid diverticula.. Tubular adenoma  . COLONOSCOPY  11/19/2008   Fields: Large amount of liquid blood with clots seen throughout the colon. Previous [place] resolution clip remained in place. Purple spot seen in the lateral aspect polypectomy site, 3 resolution clips placed here. 6 mm sessile ascending colon polyp seen and not manipulated.  . COLONOSCOPY  11/25/2008   Rourk: Blood-tinged colonic effluent, suspect recurrent post polypectomy bleeding status post placement of 3 clips on the polypectomy site on top of the 3 clips that already existed, one had fallen off since last procedure. One small polyp distal to the cecum not manipulated  . COLONOSCOPY  12/06/2009   Rourk:  Internal hemorrhoids, scattered left-sided diverticula, cecal polyps, one snared and subsequently clipped, 2 adjacent diminutive polyps ablated. tubular adenoma  . COLONOSCOPY N/A 01/21/2015   Dr. Gala Romney: 1.5 cm carpet-type polyp just distal to the ileocecal valve removed piecemeal fashion and ablated. Small polyp in the rectum. Pathology-tubular adenomas  . COLONOSCOPY N/A 09/01/2015   Procedure: COLONOSCOPY;  Surgeon: Daneil Dolin, MD;  [facility];  Service: Endoscopy;  Laterality: N/A;  845  . EYE SURGERY     bilateral  cataracts  . MENISCUS REPAIR     right knee  2000  . POLYPECTOMY  09/01/2015   Procedure: POLYPECTOMY;  Surgeon: Daneil Dolin, MD;  [facility];  Service: Endoscopy;;  colon  . TONSILLECTOMY Bilateral     There were no vitals filed for this visit.      Subjective Assessment - 12/28/15 1326    Subjective "I think (my speech) is a little better (than it was on Friday)."   Currently in Pain? No/denies               ADULT SLP TREATMENT - 12/28/15 1327      General Information   Behavior/Cognition Alert;Cooperative;Pleasant mood     Treatment Provided   Treatment provided Cognitive-Linquistic     Cognitive-Linquistic Treatment   Treatment focused on Dysarthria   Skilled Treatment Pt explained to SLP how pt's medicine schedule has changed since MD visit this morning - now taking  6 times a day instead of 5 times/day. SLP facilitated WNL loudness speech by having pt say loud "Hey! Ahhhh" with min A for full breath usually, and loudness rarely. SLP assessed pt's success with proper voicing (no strained/strangled voice) with "Hey-ah" and had pt transition to "ahh". Pt with strained/strangled voice quality with that x3 reps. In conversation of 10 minutes pt's volume reduced slighlty from WNL after 7 1/2 minutes, requiring SLP min cues occasionally. SLP educated pt about saliva production/saliva elimination from oral cavity. SLP provided pt with  ideas to minimize drooling and to incr loudness past 7 1/2 - 8 minutes (external cues).     Assessment / Recommendations / Plan   Plan Continue with current plan of care     Progression Toward Goals   Progression toward goals Progressing toward goals          SLP Education - 12/28/15 1408    Education Details Handout re: saliva production from [email or web address]   Person(s) Educated Patient   Methods Explanation;Handout   Comprehension Verbalized understanding          SLP Short Term Goals - 12/28/15 1413      SLP SHORT TERM GOAL #1   Title pt will phonate loud /a/ average 88dB for 3 sessions   Time 1   Period Weeks   Status On-going     SLP SHORT TERM GOAL #2   Title pt will produce sentences with average 71dB with WNL voicing   Status Achieved     SLP SHORT TERM GOAL #3   Title pt will maintain loudness at average 70dB with WNL voicing in 5 minutes simple conversation   Status Achieved     SLP SHORT TERM GOAL #4   Title pt will demo abdominal breathing in 5 minutes simple conversation 75% of the time over three sessions   Time 1   Period Weeks   Status On-going          SLP Long Term Goals - 12/28/15 1413      SLP LONG TERM GOAL #1   Title pt will maintain loud /a/ with average 88dB in 6 therapy sessions   Time 5   Period Weeks   Status On-going     SLP LONG TERM GOAL #2   Title pt will use functional voicing in 10 minutes of simple conversation and maintain average 70dB over three sessions   Time 5   Period Weeks   Status On-going     SLP LONG TERM GOAL #3   Title pt will demo abdominal breathing in 5 minutes mod complex conversation 50% of the time in three sessions   Time 5   Period Weeks   Status On-going          Plan - 12/28/15 1411    Clinical Impression Statement Pt's success at louder/WNL speech loudness in conversation is good for approx 8 minutes at which time conversational loudness average decreases. Pt was provided with education  re; saliva production. Skilled ST to cont to address carryover of louder WNL speech to multiple contexts and conversational partners.   Speech Therapy Frequency 2x / week   Duration --  8 weeks, but pt only choosing to schedule x1/week   Treatment/Interventions Compensatory strategies;Functional tasks;Cueing hierarchy;Patient/family education;Multimodal communcation approach;Language facilitation;SLP instruction and feedback;Internal/external aids   Potential to Achieve Goals Good   Consulted and Agree with Plan of Care Patient      Patient will benefit from skilled therapeutic intervention in order  to improve the following deficits and impairments:   Dysarthria and anarthria  Dysphagia, unspecified type    Problem List Patient Active Problem List   Diagnosis Date Noted  . History of colonic polyps   . Hx of adenomatous colonic polyps 01/04/2015  . Lumbar canal stenosis 12/04/2013  . Aortic stenosis 10/21/2013  . Hypoxemia 04/16/2012  . Primary central sleep apnea 04/16/2012  . Obstructive sleep apnea (adult) (pediatric) 04/16/2012  . Orthostatic hypotension 04/16/2012  . Sleep disturbance, unspecified 04/16/2012  . Unspecified hereditary and idiopathic peripheral neuropathy 04/16/2012  . Parkinson's 04/16/2012  . COLONIC POLYPS, ADENOMATOUS 11/19/2008  . GERD 11/19/2008  . HEMATOCHEZIA 11/19/2008  . GI BLEEDING 11/19/2008  . HYPERLIPIDEMIA-MIXED 07/03/2008  . HYPERTENSION, UNSPECIFIED 07/03/2008    [facility] ,[place], CCC-SLP  12/28/2015, 2:14 PM  [facility] [address] [place], [place], [address] Phone: [phone]   Fax:  [phone]   Name: [name] MRN: [id number] Date of Birth: 1932/01/03

## 2015-12-28 NOTE — Therapy (Signed)
[facility] [address] [place], [place], [address] Phone: [phone]   Fax:  [phone]  Occupational Therapy Treatment  Patient Details  Name: [name] MRN: [id number] Date of Birth: [birth year] Referring Provider: Dr. Star Age  Encounter Date: 12/28/2015      OT End of Session - 12/28/15 1434    Visit Number 5   Number of Visits 17   Date for OT Re-Evaluation 01/22/16   Authorization Type Medicare/Aetna (G-code needed)  **KX**   Authorization Time Period cert. 11/23/15-01/22/16   Authorization - Visit Number 5   Authorization - Number of Visits 10   OT Start Time 1405   OT Stop Time 1445   OT Time Calculation (min) 40 min   Activity Tolerance Patient tolerated treatment well   Behavior During Therapy WFL for tasks assessed/performed      Past Medical History:  Diagnosis Date  . Arthritis   . Cancer [facility])    h/o  skin  cancer  . Ex-smoker   . GERD (gastroesophageal reflux disease)   . Hypercholesteremia   . Hypertension   . Lumbar stenosis   . Parkinson's disease ([place])   . Sleep apnea    ?? sleep apnea....tested 2013 @ [facility]     . Sleep disorder     Past Surgical History:  Procedure Laterality Date  . APPENDECTOMY    . BACK SURGERY  11/2013  . COLONOSCOPY  11/17/2008   Rourk: Sessile polyp in the cecum status post saline-assisted piecemeal polypectomy, resolution clipping and epinephrine injection therapy performed. Shallow sigmoid diverticula.. Tubular adenoma  . COLONOSCOPY  11/19/2008   Fields: Large amount of liquid blood with clots seen throughout the colon. Previous [place] resolution clip remained in place. Purple spot seen in the lateral aspect polypectomy site, 3 resolution clips placed here. 6 mm sessile ascending colon polyp seen and not manipulated.  . COLONOSCOPY  11/25/2008   Rourk: Blood-tinged colonic effluent, suspect recurrent post polypectomy bleeding  status post placement of 3 clips on the polypectomy site on top of the 3 clips that already existed, one had fallen off since last procedure. One small polyp distal to the cecum not manipulated  . COLONOSCOPY  12/06/2009   Rourk: Internal hemorrhoids, scattered left-sided diverticula, cecal polyps, one snared and subsequently clipped, 2 adjacent diminutive polyps ablated. tubular adenoma  . COLONOSCOPY N/A 01/21/2015   Dr. Gala Romney: 1.5 cm carpet-type polyp just distal to the ileocecal valve removed piecemeal fashion and ablated. Small polyp in the rectum. Pathology-tubular adenomas  . COLONOSCOPY N/A 09/01/2015   Procedure: COLONOSCOPY;  Surgeon: Daneil Dolin, MD;  [facility];  Service: Endoscopy;  Laterality: N/A;  845  . EYE SURGERY     bilateral  cataracts  . MENISCUS REPAIR     right knee  2000  . POLYPECTOMY  09/01/2015   Procedure: POLYPECTOMY;  Surgeon: Daneil Dolin, MD;  [facility];  Service: Endoscopy;;  colon  . TONSILLECTOMY Bilateral     There were no vitals filed for this visit.      Subjective Assessment - 12/28/15 1410    Pertinent History PD diagnosis approx 6 years ago; hx of low back surgery 11/2013; hx of bilateral RTC injuries; HTN, othostatic hypotension, sleep apnea, lumbar stenosis   Patient Stated Goals improving donning coat, improve fine motor skills   Currently in Pain? No/denies         Treatment:Arm bike x 5 mins level  1 for conditioning, pt maintained 40 rpm. Standing for dynamic step and reach to place graded clothespins on vertical antennae with right and left UE's, min v.c. For larger amplitude movements Seated copying small peg design with bilateral UE's for increased fine motor coordination with a cognitive component, removing with in hand manipulation, bilateral UE's min v.c. Donning/ doffing jacket x 4, pt with improved performance when donning like a cape with jacket facing away from him  initially.                       OT Short Term Goals - 12/24/15 1357      OT SHORT TERM GOAL #1   Title Pt will be independent with updated PD-specific HEP.--check STGs 12/22/15   Time 4   Period Weeks   Status New     OT SHORT TERM GOAL #2   Title Pt will demo at least 120* L shoulder flex with -20* or greater elbow extension for improved overhead reaching.   Baseline 115* with -25* elbow ext   Time 4   Period Weeks   Status New     OT SHORT TERM GOAL #3   Title Pt will be able to button/unbutton 3 buttons on tabletop in 40 sec or less for incr ease with dressing.   Baseline 43.44   Time 4   Period Weeks   Status New     OT SHORT TERM GOAL #4   Title Pt will be able to write 3 sentences with 100% accuracy and no significant incr in size.   Time 4   Status New           OT Long Term Goals - 11/23/15 1545      OT LONG TERM GOAL #1   Title Pt will verbalize understanding of updated strategies/AE to incr ease with ADLs prn.--check LTGs 01/22/16   Time 8   Period Weeks   Status New     OT LONG TERM GOAL #2   Title Pt will improve bradykinesia, functional reaching, coordination for ADLs as shown by improving score on box and blocks test by at least 4 blocks bilaterally.   Baseline R-46 blocks, L-47 blocks   Status New     OT LONG TERM GOAL #3   Title Pt will improve ability to don/doff jacket as shown by improving time on PPT#4 (with gown) in 21sec or less.   Baseline 24.84sec    Time 8   Period Weeks   Status New     OT LONG TERM GOAL #4   Title Pt will be able to button/unbutton 3 buttons on tabletop in 35sec or less for incr ease with dressing.   Baseline 43.44sec   Time 8   Period Weeks   Status New     OT LONG TERM GOAL #5   Title Pt will improve bilateral standing functional reach by at least 3" for incr balance for IADLs.   Baseline R-5", L-4"   Time 8   Period Weeks   Status New     OT LONG TERM GOAL #6   Title  -------------------------     OT LONG TERM GOAL #7   Title ----------------------               Plan - 12/28/15 1431    Clinical Impression Statement Pt is progressing towards goals. He demonstrates improved performance of donning/ doffing jacket using adapted strategies.   Rehab Potential Good   OT  Frequency 2x / week   OT Duration 8 weeks   OT Treatment/Interventions Self-care/ADL training;Moist Heat;Electrical Stimulation;Fluidtherapy;Contrast Bath;Passive range of motion;Therapeutic activities;Cognitive remediation/compensation;DME and/or AE instruction;Parrafin;Cryotherapy;Therapeutic exercises;Manual Therapy;Neuromuscular education;Splinting;Ultrasound;Energy conservation;Therapeutic exercise;Functional Mobility Training;Patient/family education;Balance training   Plan work towards unmet goals, adapted strategies for ADLS   OT Home Exercise Plan Education issued:  seated PWR!, up, rock and step, supine PWR! basic 4; PWR! hands and coordination HEP   Consulted and Agree with Plan of Care Patient      Patient will benefit from skilled therapeutic intervention in order to improve the following deficits and impairments:     Visit Diagnosis: Other symptoms and signs involving the nervous system  Other symptoms and signs involving the musculoskeletal system  Abnormal posture  Other lack of coordination  Stiffness of left elbow, not elsewhere classified  Stiffness of left shoulder, not elsewhere classified  Stiffness of right elbow, not elsewhere classified    Problem List Patient Active Problem List   Diagnosis Date Noted  . History of colonic polyps   . Hx of adenomatous colonic polyps 01/04/2015  . Lumbar canal stenosis 12/04/2013  . Aortic stenosis 10/21/2013  . Hypoxemia 04/16/2012  . Primary central sleep apnea 04/16/2012  . Obstructive sleep apnea (adult) (pediatric) 04/16/2012  . Orthostatic hypotension 04/16/2012  . Sleep disturbance, unspecified  04/16/2012  . Unspecified hereditary and idiopathic peripheral neuropathy 04/16/2012  . Parkinson's 04/16/2012  . COLONIC POLYPS, ADENOMATOUS 11/19/2008  . GERD 11/19/2008  . HEMATOCHEZIA 11/19/2008  . GI BLEEDING 11/19/2008  . HYPERLIPIDEMIA-MIXED 07/03/2008  . HYPERTENSION, UNSPECIFIED 07/03/2008    Ghalia Reicks 12/28/2015, 2:34 PM  [facility] [address] [place], [place], [address] Phone: [phone]   Fax:  [phone]  Name: [name] MRN: [id number] Date of Birth: 1931/03/06

## 2015-12-28 NOTE — Progress Notes (Signed)
Subjective:    Patient ID: [name] is a 82 y.o. male.  HPI     Interim history:  [name] is a very pleasant 82 year old right-handed gentleman with an underlying medical history of hypertension, hyperlipidemia, history of cancer, ex-smoker, and complex sleep apnea on BiPAP ST, who presents for followup consultation off his left-sided predominant Parkinson's disease as well as his primary central sleep apnea and OSA. He is unaccompanied today. I last saw him on 06/28/2015, at which time he reported doing okay, tremor perhaps worse. He noticed no significant difference after we increase the Sinemet. Constipation was under control with MiraLAX. He was not exercising very much but does enjoy working in the yard. He had no recent falls. He was not using a cane or walker. He was compliant with his BiPAP ST. I suggested we keep his medications the same but monitor his leg swelling since he was on Mirapex.   Today, 12/28/2015: I reviewed his BiPAP compliance data from 11/28/2015 through 12/27/2015, which is a total of 30 days, during which time he used his machine every night with percent used days greater than 4 hours at 73%, indicating adequate compliance with an average usage of only 4 hours and 21 minutes. Residual AHI 2 per hour, leak on the acceptable side with the 95th percentile at 13.4 L/m on a pressure of 13/9 with a rate of 10.  Today, 12/28/2015: He reports doing overall fairly well. He has noted an increase in tremors particularly in the left hand and increase in stiffness. He has not fallen thankfully. He had recent blood work through his PCP on 11/26/2015 and brought me a copy of the results: lipid panel was good, LDL borderline at 102, total cholesterol 170, HDL 55. Glucose 101, creatinine slightly up at 1.35, otherwise CMP unremarkable, A1c was good at 5.2, CBC with differential was unremarkable with the exception of borderline MCH at 34.1. Patient had a recent reduction in his  amlodipine couple weeks ago. He has been in outpatient therapy. He does admit that he does not drink enough water. His drooling has become worse. He has sometimes clear nasal drainage especially when bending over. He switch DME companies to [employer], because of insurance.   Previously:    12/28/2014, at which time he reported doing fairly well, no recent falls, tremor somewhat worse and dexterity worse. He had noted some trouble with his turns at times. He was on Sinemet 4 times a day, usually at 6 AM, 10 AM, 2 PM and 8 PM. He had constipation and was on a stool softener and prunes. He was to have a colonoscopy on 01/04/2015.    I reviewed his BiPAP ST compliance data from 05/29/2015 through 06/27/2015, which is a total of 30 days during which time he used his machine every night with percent used days greater than 4 hours at 97%, indicating excellent compliance with an average usage of 5 hours and 16 minutes, residual AHI 1.5 per hour, leak acceptable with the 95th percentile at 12.4 L/m on a pressure of 13/9 cm with a rate of 10.   I saw him on 07/09/2014, at whicht time he reported feeling stable for the most part. He had not experienced any drastic changes in his symptoms. He reported no falls. He had no significant low back pain and no longer was taking any narcotics. Unfortunately, his wife was involved in a car accident and sustained broken ribs and had to be in the ICU for some time.  He was using his BiPAP machine regularly. He reported one skip night because of a stomach bug. Overall, he felt he was sleeping well. He felt that the increase in Sinemet helped his trembling. He was tolerating his medication. He had no new major mood or memory issues.    I reviewed his BiPAP compliance data from 11/23/2014 through 12/22/2014 which is a total of 30 days during which time he used his machine every night with percent used days greater than 4 hours at 93%, indicating excellent compliance with an average  usage of 4 hours and 54 minutes, residual AHI low at 1.7 per hour, leaked low with the 95th percentile at 7.4 L/m on a pressure of 13/9 cm with a backup rate of 10.    I saw him on 03/03/2014, at which time he reported experiencing thick mucus first thing in the morning. His tremor was worse per wife. He received new supplies recently. He has some thick mucus first thing in the morning. His tremor has become worse per wife. His back surgery in November 2015 had relieved much of his pain. He was no longer on pain medication. He was taking stool softeners for his constipation. I suggested he continue with Mirapex 3 times a day but I did ask him to increase Sinemet to 4 times a day.   I reviewed his BiPAP compliance data from 06/07/2014 through 07/06/2014 which is a total of 30 days during which time he used his machine 29 days with percent used days greater than 4 hours at 87%, indicating very good compliance with an average usage of 5 hours and 2 minutes, residual AHI low at 1.6 per hour, leak low with the 95th percentile at 7.6 L/m on a pressure of 13/9 with a rate of 10.   I saw him on 12/02/2013, at which time he was compliant with BiPAP ST. He had spine surgery under Dr. Cyndy Freeze on 12/04/2013 which went well. He is in physical therapy. He had an L spine MRI on 11/06/13: Degenerative lumbar spondylosis with multilevel disc disease and facet disease. There is bilateral lateral recess and bilateral foraminal stenosis at L2-3 and L3-4. The most significant level however is L4-5 with there is severe spinal, bilateral lateral recess and foraminal stenosis. We talked about his fluid intake. He was still not consistent with his dose timings for his Sinemet and Mirapex.   I reviewed his compliance data from 01/27/2014 through 02/25/2014 which is a total of 30 days during which time he used his machine every night with percent used days greater than 4 hours of 87%, indicating very good compliance with an average  usage of 5 hours and 6 minutes, residual AHI of 3.5 per hour and leak acceptable with the 95th percentile at 15 L/m.   I saw him on 05/27/2013, at which time he reported being compliant with his BiPAP, averaging 4-5 hours each night. He was still taking his Sinemet with his meals on most days. He felt his tremor was a little worse. His wife felt that he was otherwise stable. He denied any new cognitive issues, depression, hallucinations, anxiety, delusions. He was drinking 3 cups of coffee in the morning and not enough water. I did not increase his medication but asked him to take his Sinemet away from his mealtimes. I considered Linzess for chronic constipation but asked him to increase his water intake and watch constipation symptoms before we use medications.   I reviewed his compliance data from 10/31/2013 through 11/29/2013  which is a total of 30 days during which time he used his machine every night except for 1 night. Percent used days greater than 4 hours was 90% indicating excellent compliance. Residual AHI at 2.5 per hour, leaked low. Pressure at 13/9 with a rate of 10.   I saw him on 11/27/2012, at which time I did not change his medication regimen with the exception of the timing for his Sinemet and Mirapex: I advised him to take it at 7 AM, 11 AM and 4 PM. I also asked him to continue using his BiPAP regularly and take it with him on any vacation trips. He was congratulated on his compliance.   I saw him on 07/26/2012 after had his sleep study. He has been compliant on BiPAP therapy. I had increased his Sinemet to one pill 3 times a day. He was taking it with his meals and did not note any significant improvement in his symptoms, but, then again, he was taking it right after his meals. He was asking whether he should take his BiPAP machine with him to his planned trip to [place].     I first met him on 02/19/2012 after his baseline sleep study. His total AHI was 39.5 per hour based  primarily on central apneas. His obstructive AHI was around 15 per hour. His oxyhemoglobin desaturation nadir was 85% and he spent 3 hours and 31 minutes below the saturation of 90% for the night. I asked him to come back for a CPAP titration study, possibly BiPAP but he wanted to hold off until his appointment in April as he was going to be out of town and he also wanted to bring his wife for discussion.   I then saw him back on 04/17/2012 and again went over his test results with him and his wife. He had been doing well from the PD standpoint. He agreed to come back for another sleep study with full night titration. His sleep titration study was on 05/14/2012 and I went over his test results with him and his wife in detail during our visit in July. Sleep efficiency was reduced at 71.5% with a latency to sleep of 2 minutes. Wake after sleep onset was highly elevated at 124 minutes with mild to moderate sleep fragmentation noted. He had increased percentage of REM sleep at 27.2%. He had a normal REM latency. There were no significant aortic leg movements. He was started on CPAP at a pressure of 5 cm and titrated up to 9 cm of water pressure but he had significant central apneas and therefore changed to BiPAP at 11/7 and then switch to ST mode for ongoing central events. His final pressure was 13/9 with a backup rate of 10 on which she had a residual AHI of 0 per hour and supine REM sleep achieved. Oxyhemoglobin desaturation nadir on the final pressure was 90%.   He was placed on BiPAP ST at 13/9 cm with a backup rate of 10. I reviewed his compliance from 06/26/2012 through 07/25/2012 (29 days), during which time he used it every day. His average usage was 5 hours and 6 minutes and percent used days greater than 4 hours was 96% indicating excellent compliance. His residual AHI was around 6 indicating fairly reasonable pressure settings. He reported tolerating the treatment and he changed from a FFM to a nasal mask.  He denied depression, memory loss, lightheadedness, or hallucinations. I also reviewed more recent compliance data from 09/29/2012 through 10/28/2012 (30 days), during  which time he used his machine every day. His average usage was 5 hours and 23 minutes, his percent used days greater than 4 hours was 29 days, which is 97%, indicating excellent compliance. His residual AHI was 2.6 per hour indicating an appropriate treatment setting of 13/9 cm with a backup rate of 10 per minute.   His Past Medical History Is Significant For: Past Medical History:  Diagnosis Date  . Arthritis   . Cancer [facility])    h/o  skin  cancer  . Ex-smoker   . GERD (gastroesophageal reflux disease)   . Hypercholesteremia   . Hypertension   . Lumbar stenosis   . Parkinson's disease ([place])   . Sleep apnea    ?? sleep apnea....tested 2013 @ [facility]     . Sleep disorder     His Past Surgical History Is Significant For: Past Surgical History:  Procedure Laterality Date  . APPENDECTOMY    . BACK SURGERY  11/2013  . COLONOSCOPY  11/17/2008   Rourk: Sessile polyp in the cecum status post saline-assisted piecemeal polypectomy, resolution clipping and epinephrine injection therapy performed. Shallow sigmoid diverticula.. Tubular adenoma  . COLONOSCOPY  11/19/2008   Fields: Large amount of liquid blood with clots seen throughout the colon. Previous [place] resolution clip remained in place. Purple spot seen in the lateral aspect polypectomy site, 3 resolution clips placed here. 6 mm sessile ascending colon polyp seen and not manipulated.  . COLONOSCOPY  11/25/2008   Rourk: Blood-tinged colonic effluent, suspect recurrent post polypectomy bleeding status post placement of 3 clips on the polypectomy site on top of the 3 clips that already existed, one had fallen off since last procedure. One small polyp distal to the cecum not manipulated  . COLONOSCOPY  12/06/2009   Rourk: Internal hemorrhoids, scattered left-sided  diverticula, cecal polyps, one snared and subsequently clipped, 2 adjacent diminutive polyps ablated. tubular adenoma  . COLONOSCOPY N/A 01/21/2015   Dr. Gala Romney: 1.5 cm carpet-type polyp just distal to the ileocecal valve removed piecemeal fashion and ablated. Small polyp in the rectum. Pathology-tubular adenomas  . COLONOSCOPY N/A 09/01/2015   Procedure: COLONOSCOPY;  Surgeon: Daneil Dolin, MD;  Location: [facility];  Service: Endoscopy;  Laterality: N/A;  845  . EYE SURGERY     bilateral  cataracts  . MENISCUS REPAIR     right knee  2000  . POLYPECTOMY  09/01/2015   Procedure: POLYPECTOMY;  Surgeon: Daneil Dolin, MD;  Location: [facility];  Service: Endoscopy;;  colon  . TONSILLECTOMY Bilateral     His Family History Is Significant For: Family History  Problem Relation Age of Onset  . Dementia Mother   . Cancer Brother   . Stroke Brother     His Social History Is Significant For: Social History   Social History  . Marital status: Married    Spouse name: Clinical research associate  . Number of children: 4  . Years of education: 10   Occupational History  . Retired     Social History Main Topics  . Smoking status: Former Smoker    Packs/day: 1.00    Types: Cigarettes, Pipe    Start date: 01/22/1951    Quit date: 01/21/1989  . Smokeless tobacco: Never Used  . Alcohol use No  . Drug use: No  . Sexual activity: Not Asked   Other Topics Concern  . None   Social History Narrative  . None    His Allergies Are:  No Known  Allergies:   His Current Medications Are:  Outpatient Encounter Prescriptions as of 12/28/2015  Medication Sig  . amLODipine (NORVASC) 10 MG tablet Take 5 mg by mouth daily.   . calcium gluconate 500 MG tablet Take 500 mg by mouth daily.  . carbidopa-levodopa (SINEMET IR) 25-100 MG tablet Take 1 pill 5 times a day: 6, 10, 2PM, 6PM, and 9 PM  . glucosamine-chondroitin 500-400 MG tablet Take 1 tablet by mouth once.  . Multiple Vitamin (MULTIVITAMIN) tablet Take 1  tablet by mouth daily.  Marland Kitchen omeprazole (PRILOSEC OTC) 20 MG tablet Take 20 mg by mouth daily.  . pramipexole (MIRAPEX) 0.5 MG tablet Take 1 tablet (0.5 mg total) by mouth 3 (three) times daily.  Marland Kitchen telmisartan-hydrochlorothiazide (MICARDIS HCT) 80-25 MG per tablet Take 1 tablet by mouth daily.  . valsartan-hydrochlorothiazide (DIOVAN-HCT) 80-12.5 MG tablet   . vitamin C (ASCORBIC ACID) 500 MG tablet Take 500 mg by mouth daily.  . [DISCONTINUED] polyethylene glycol-electrolytes (TRILYTE) 420 g solution Take 4,000 mLs by mouth as directed.   No facility-administered encounter medications on file as of 12/28/2015.   :  Review of Systems:  Out of a complete 14 point review of systems, all are reviewed and negative with the exception of these symptoms as listed below:  Review of Systems  Neurological:       Pt presents today to discuss his parkinson's and his cpap compliance. Pt has no complaints. Pt says that the people trying to sell him cpap supplies are "driving me nuts." Pt is unsure of his DME; he says it was [employer] but they got "bought out."    Objective:  Neurologic Exam  Physical Exam Physical Examination:   Vitals:   12/28/15 0821  BP: (!) 160/79  Pulse: 98  Resp: 20   General Examination: The patient is a very pleasant 82 y.o. male in no acute distress.  HEENT exam: He has a moderate degree of nuchal rigidity with a mild lower jaw and lip tremor, which is fairly constant. He has a mild to moderately masked facies with decreased eye blink rate. Hearing is mildly impaired. Funduscopic exam is normal bilaterally. He is status post cataract repairs bilaterally. He has prescription eyeglasses. Speech is moderately hypophonic, and he has minimal dysarthria and minimal drooling is noted, stable. Pupils are equal, round and reactive to light. On extraocular tracking he has mild saccadic breakdown especially on downward gaze. He has no carotid bruits. Chest is clear to  auscultation without wheezing or rhonchi noted. Heart sounds are normal with a slight systolic heart murmur (2/6), no rubs or gallops noted, unchanged. Abdomen is soft, nontender with normal bowel sounds appreciated. There is 1+ pitting edema in the distal lower extremities bilaterally, less from last time.    No joint deformities are noted. Skin is warm and dry but he does have several old appearing bruises across the dorsi of his hands, stable. Neurologically: Mental status: The patient is awake, alert and oriented in all 3 spheres. His memory, attention, language and knowledge are appropriate. Cranial nerves are as described under HEENT exam. In addition airway exam reveals a moderately tight airway secondary to a narrow airway entry. Tongue protrudes centrally and palate elevates symmetrically. Motor exam: Normal bulk and strength is noted. Tone is increased with some cogwheeling noted in both upper extremities, L more than R. Tone is also increased in the left lower extremity. Tone is mildly increased on the right side. He has a moderate constant resting tremor  in the left upper extremity. He has an intermittent mild resting tremor on the right upper extremity, tremor overall is mildly worse than before. Fine motor skills with finger taps and hand movements as well as rapid alternating patting is moderately to severely impaired on the left and moderately so on the right. Foot taps are moderately impaired on the right and moderately to severely so on the left. He stands up from the seated position with mild problems and his posture is moderately stooped. He walks with decreased arm swing bilaterally, L more than right. He has a fairly good stride length but decreased pace and turns in 3 steps. His balance is fairly well preserved, but he did have mild insecurity with turns, unchanged. Romberg is negative. Reflexes are 1+ in the UEs and absent in the LEs. Sensory exam is unchanged from before and normal to  all modalities tested. Cerebellar testing shows no dysmetria or intention tremor on finger to nose testing.    Assessment and Plan:   In summary, [name] is a very pleasant 82 year old male with an underlying medical history of hypertension, hyperlipidemia, history of cancer, ex-smoker, and complex sleep apnea on BiPAP ST, who presents for followup consultation off his left-sided predominant Parkinson's disease as well as his sleep apnea. His history is complicated by chronic constipation, severe mixed sleep apnea which is treated well with BiPAP ST therapy and chronic back pain for which he had surgery on 12/04/2013 with good success. He is tolerating the Sinemet which is currently at 1 pill 5 times a day. Today, I asked him to try to increase Sinemet to 1 pill 6 times a day, every 3 hours, starting at 6 AM. He is advised to try this consistently for a couple of weeks and give [place] an update via phone call. If he has improvement in his tremor and stiffness, he can continue with one pill 6 times a day. Otherwise, he can reduce it back to 1 pill 5 times a day, especially if he were to develop any side effects such as lightheadedness, nausea, dizziness, sleepiness, or hallucinations. He has had some motor symptom progression and his exam is also reflecting that. His lower extremity swelling appears to be less, he had a recent decrease in his amlodipine which may have helped, I suggested we continue with Mirapex and continue to monitor his edema. He is commended for his full BiPAP compliance. He has enough supplies at this time. I would like to see him back in 6 months, sooner if needed. He is reminded to call us in 2 or 3 weeks after increasing the Sinemet to 1 pill 6 times a day. I adjusted his 90 day prescription in that regard as well and renewed the prescription for Mirapex for 90 days as well. I answered all his questions today and he was in agreement.  I spent 25 minutes in total face-to-face time  with the patient, more than 50% of which was spent in counseling and coordination of care, reviewing test results, reviewing medication and discussing or reviewing the diagnosis of PD and sleep apnea, the prognosis and treatment options.

## 2015-12-28 NOTE — Patient Instructions (Addendum)
Let's try to increase your Sinemet to 1 pill 6 times a day, at 6, 9, 12, 3PM, 6PM and 9PM. You can try this for 2 weeks and call us for an update: if you feel better, particularly with regards to your tremor and stiffness, we will continue with the 6 doses a day. If you have side effects, such as: nausea, vomiting, sedation, confusion, lightheadedness, hallucinations, diarrhea or drop in blood pressure, we will go down to 5 pills a day.  We will keep your Mirapex the same and monitor your leg swelling, which appears to be a little better.   Your DME company for your BiPAP supplies is [employer] and their no. Is: [phone].   Please increase your water intake.

## 2015-12-31 ENCOUNTER — Ambulatory Visit: Payer: Medicare Other | Admitting: Occupational Therapy

## 2015-12-31 ENCOUNTER — Ambulatory Visit: Payer: Medicare Other | Admitting: Speech Pathology

## 2015-12-31 DIAGNOSIS — R29818 Other symptoms and signs involving the nervous system: Secondary | ICD-10-CM

## 2015-12-31 DIAGNOSIS — R29898 Other symptoms and signs involving the musculoskeletal system: Secondary | ICD-10-CM | POA: Diagnosis not present

## 2015-12-31 DIAGNOSIS — M25622 Stiffness of left elbow, not elsewhere classified: Secondary | ICD-10-CM | POA: Diagnosis not present

## 2015-12-31 DIAGNOSIS — R278 Other lack of coordination: Secondary | ICD-10-CM

## 2015-12-31 DIAGNOSIS — R293 Abnormal posture: Secondary | ICD-10-CM | POA: Diagnosis not present

## 2015-12-31 DIAGNOSIS — R471 Dysarthria and anarthria: Secondary | ICD-10-CM

## 2015-12-31 DIAGNOSIS — M25612 Stiffness of left shoulder, not elsewhere classified: Secondary | ICD-10-CM | POA: Diagnosis not present

## 2015-12-31 DIAGNOSIS — M25621 Stiffness of right elbow, not elsewhere classified: Secondary | ICD-10-CM

## 2015-12-31 NOTE — Therapy (Signed)
[facility] [address] [place], [place], [address] Phone: [phone]   Fax:  [phone]  Speech Language Pathology Treatment  Patient Details  Name: [name] MRN: [id number] Date of Birth: 02/04/1931 Referring Provider: Star Age, M.D.  Encounter Date: 12/31/2015      End of Session - 12/31/15 1101    Visit Number 6   Number of Visits 17   Date for SLP Re-Evaluation 02/04/16   SLP Start Time 1016   SLP Stop Time  1100   SLP Time Calculation (min) 44 min   Activity Tolerance Patient tolerated treatment well      Past Medical History:  Diagnosis Date  . Arthritis   . Cancer [facility])    h/o  skin  cancer  . Ex-smoker   . GERD (gastroesophageal reflux disease)   . Hypercholesteremia   . Hypertension   . Lumbar stenosis   . Parkinson's disease ([place])   . Sleep apnea    ?? sleep apnea....tested 2013 @ [facility]     . Sleep disorder     Past Surgical History:  Procedure Laterality Date  . APPENDECTOMY    . BACK SURGERY  11/2013  . COLONOSCOPY  11/17/2008   Rourk: Sessile polyp in the cecum status post saline-assisted piecemeal polypectomy, resolution clipping and epinephrine injection therapy performed. Shallow sigmoid diverticula.. Tubular adenoma  . COLONOSCOPY  11/19/2008   Fields: Large amount of liquid blood with clots seen throughout the colon. Previous Boston resolution clip remained in place. Purple spot seen in the lateral aspect polypectomy site, 3 resolution clips placed here. 6 mm sessile ascending colon polyp seen and not manipulated.  . COLONOSCOPY  11/25/2008   Rourk: Blood-tinged colonic effluent, suspect recurrent post polypectomy bleeding status post placement of 3 clips on the polypectomy site on top of the 3 clips that already existed, one had fallen off since last procedure. One small polyp distal to the cecum not manipulated  . COLONOSCOPY  12/06/2009   Rourk:  Internal hemorrhoids, scattered left-sided diverticula, cecal polyps, one snared and subsequently clipped, 2 adjacent diminutive polyps ablated. tubular adenoma  . COLONOSCOPY N/A 01/21/2015   Dr. Gala Romney: 1.5 cm carpet-type polyp just distal to the ileocecal valve removed piecemeal fashion and ablated. Small polyp in the rectum. Pathology-tubular adenomas  . COLONOSCOPY N/A 09/01/2015   Procedure: COLONOSCOPY;  Surgeon: Daneil Dolin, MD;  [facility];  Service: Endoscopy;  Laterality: N/A;  845  . EYE SURGERY     bilateral  cataracts  . MENISCUS REPAIR     right knee  2000  . POLYPECTOMY  09/01/2015   Procedure: POLYPECTOMY;  Surgeon: Daneil Dolin, MD;  [facility];  Service: Endoscopy;;  colon  . TONSILLECTOMY Bilateral     There were no vitals filed for this visit.      Subjective Assessment - 12/31/15 1028    Subjective "I think my wife can hear me a little better"   Currently in Pain? No/denies               ADULT SLP TREATMENT - 12/31/15 1028      General Information   Behavior/Cognition Alert;Cooperative;Pleasant mood     Treatment Provided   Treatment provided Cognitive-Linquistic     Cognitive-Linquistic Treatment   Treatment focused on Dysarthria   Skilled Treatment Loud /a/ with Hey to initiate /a/ with average of 88dB as pt starts out over 90dB and decreases in volume.  Structured speech tasks with average of 70dB with rare min A. Simple conversation  with average of 70dB and occasional min A due to volume decay.     Assessment / Recommendations / Plan   Plan Continue with current plan of care     Progression Toward Goals   Progression toward goals Progressing toward goals            SLP Short Term Goals - 12/31/15 1058      SLP SHORT TERM GOAL #1   Title pt will phonate loud /a/ average 88dB for 3 sessions   Baseline 12/31/15;   Time 1   Period Weeks   Status On-going     SLP SHORT TERM GOAL #2   Title pt will produce  sentences with average 71dB with WNL voicing   Status Achieved     SLP SHORT TERM GOAL #3   Title pt will maintain loudness at average 70dB with WNL voicing in 5 minutes simple conversation   Status Achieved     SLP SHORT TERM GOAL #4   Title pt will demo abdominal breathing in 5 minutes simple conversation 75% of the time over three sessions   Time 1   Period Weeks   Status On-going          SLP Long Term Goals - 12/31/15 1101      SLP LONG TERM GOAL #1   Title pt will maintain loud /a/ with average 88dB in 6 therapy sessions   Time 5   Period Weeks   Status On-going     SLP LONG TERM GOAL #2   Title pt will use functional voicing in 10 minutes of simple conversation and maintain average 70dB over three sessions   Time 5   Period Weeks   Status On-going     SLP LONG TERM GOAL #3   Title pt will demo abdominal breathing in 5 minutes mod complex conversation 50% of the time in three sessions   Time 5   Period Weeks   Status On-going          Plan - 12/31/15 1057    Clinical Impression Statement Pt's success at louder/WNL speech loudness in conversation is good for approx 8 minutes at which time conversational loudness average decreases. Pt was provided with education re; saliva production. Skilled ST to cont to address carryover of louder WNL speech to multiple contexts and conversational partners.   Speech Therapy Frequency 2x / week   Treatment/Interventions Compensatory strategies;Functional tasks;Cueing hierarchy;Patient/family education;Multimodal communcation approach;Language facilitation;SLP instruction and feedback;Internal/external aids   Potential to Achieve Goals Good   Consulted and Agree with Plan of Care Patient      Patient will benefit from skilled therapeutic intervention in order to improve the following deficits and impairments:   Dysarthria and anarthria    Problem List Patient Active Problem List   Diagnosis Date Noted  . History of  colonic polyps   . Hx of adenomatous colonic polyps 01/04/2015  . Lumbar canal stenosis 12/04/2013  . Aortic stenosis 10/21/2013  . Hypoxemia 04/16/2012  . Primary central sleep apnea 04/16/2012  . Obstructive sleep apnea (adult) (pediatric) 04/16/2012  . Orthostatic hypotension 04/16/2012  . Sleep disturbance, unspecified 04/16/2012  . Unspecified hereditary and idiopathic peripheral neuropathy 04/16/2012  . Parkinson's 04/16/2012  . COLONIC POLYPS, ADENOMATOUS 11/19/2008  . GERD 11/19/2008  . HEMATOCHEZIA 11/19/2008  . GI BLEEDING 11/19/2008  . HYPERLIPIDEMIA-MIXED 07/03/2008  . HYPERTENSION, UNSPECIFIED 07/03/2008    [place] Gustafson, Mickel Baas  Ann MS, CCC-SLP 12/31/2015, 11:02 AM  [facility] [address] [place], [place], [address] Phone: [phone]   Fax:  [phone]   Name: NICKALAS HEMM MRN: [id number] Date of Birth: December 26, 1931

## 2015-12-31 NOTE — Therapy (Signed)
[facility] [address] [place], [place], [address] Phone: [phone]   Fax:  [phone]  Occupational Therapy Treatment  Patient Details  Name: [name] MRN: [id number] Date of Birth: 21-Oct-1931 Referring Provider: Dr. Star Age  Encounter Date: 12/31/2015      OT End of Session - 12/31/15 1135    Visit Number 6   Number of Visits 17   Date for OT Re-Evaluation 01/22/16   Authorization Type Medicare/[employer] (G-code needed)  **KX**   Authorization Time Period cert. 11/23/15-01/22/16   Authorization - Visit Number 6   Authorization - Number of Visits 10   OT Start Time 1104   OT Stop Time 1145   OT Time Calculation (min) 41 min   Activity Tolerance Patient tolerated treatment well   Behavior During Therapy WFL for tasks assessed/performed      Past Medical History:  Diagnosis Date  . Arthritis   . Cancer [facility])    h/o  skin  cancer  . Ex-smoker   . GERD (gastroesophageal reflux disease)   . Hypercholesteremia   . Hypertension   . Lumbar stenosis   . Parkinson's disease ([place])   . Sleep apnea    ?? sleep apnea....tested 2013 @ [facility]     . Sleep disorder     Past Surgical History:  Procedure Laterality Date  . APPENDECTOMY    . BACK SURGERY  11/2013  . COLONOSCOPY  11/17/2008   Rourk: Sessile polyp in the cecum status post saline-assisted piecemeal polypectomy, resolution clipping and epinephrine injection therapy performed. Shallow sigmoid diverticula.. Tubular adenoma  . COLONOSCOPY  11/19/2008   Fields: Large amount of liquid blood with clots seen throughout the colon. Previous Boston resolution clip remained in place. Purple spot seen in the lateral aspect polypectomy site, 3 resolution clips placed here. 6 mm sessile ascending colon polyp seen and not manipulated.  . COLONOSCOPY  11/25/2008   Rourk: Blood-tinged colonic effluent, suspect recurrent post polypectomy bleeding  status post placement of 3 clips on the polypectomy site on top of the 3 clips that already existed, one had fallen off since last procedure. One small polyp distal to the cecum not manipulated  . COLONOSCOPY  12/06/2009   Rourk: Internal hemorrhoids, scattered left-sided diverticula, cecal polyps, one snared and subsequently clipped, 2 adjacent diminutive polyps ablated. tubular adenoma  . COLONOSCOPY N/A 01/21/2015   Dr. Gala Romney: 1.5 cm carpet-type polyp just distal to the ileocecal valve removed piecemeal fashion and ablated. Small polyp in the rectum. Pathology-tubular adenomas  . COLONOSCOPY N/A 09/01/2015   Procedure: COLONOSCOPY;  Surgeon: Daneil Dolin, MD;  [facility];  Service: Endoscopy;  Laterality: N/A;  845  . EYE SURGERY     bilateral  cataracts  . MENISCUS REPAIR     right knee  2000  . POLYPECTOMY  09/01/2015   Procedure: POLYPECTOMY;  Surgeon: Daneil Dolin, MD;  [facility];  Service: Endoscopy;;  colon  . TONSILLECTOMY Bilateral     There were no vitals filed for this visit.      Subjective Assessment - 12/31/15 1249    Pertinent History PD diagnosis approx 6 years ago; hx of low back surgery 11/2013; hx of bilateral RTC injuries; HTN, othostatic hypotension, sleep apnea, lumbar stenosis   Patient Stated Goals improving donning coat, improve fine motor skills   Currently in Pain? No/denies             Treatment: Pt  practiced donning/ doffing jacket and fastening buttons with adapted strategies following therapist review and instruction. Therapist checked progress towards short term goals. PWR! Basic 4 in standing 10 -20 reps each exercise, min v.c. For larger amplitude movements. Handwriting strategies, with improved performance when pt slows down.                   OT Short Term Goals - 12/31/15 1103      OT SHORT TERM GOAL #1   Title Pt will be independent with updated PD-specific HEP.--check STGs 12/22/15   Time 4    Period Weeks   Status Achieved     OT SHORT TERM GOAL #2   Title Pt will demo at least 120* L shoulder flex with -20* or greater elbow extension for improved overhead reaching.   Time 4   Period Weeks   Status Achieved     OT SHORT TERM GOAL #3   Title Pt will be able to button/unbutton 3 buttons on tabletop in 40 sec or less for incr ease with dressing.   Time 4   Period Weeks   Status On-going  63.41 secs     OT SHORT TERM GOAL #4   Title Pt will be able to write 3 sentences with 100% accuracy and no significant incr in size.   Time 4   Period Weeks   Status On-going  grossly 90-95% legibility           OT Long Term Goals - 11/23/15 1545      OT LONG TERM GOAL #1   Title Pt will verbalize understanding of updated strategies/AE to incr ease with ADLs prn.--check LTGs 01/22/16   Time 8   Period Weeks   Status New     OT LONG TERM GOAL #2   Title Pt will improve bradykinesia, functional reaching, coordination for ADLs as shown by improving score on box and blocks test by at least 4 blocks bilaterally.   Baseline R-46 blocks, L-47 blocks   Status New     OT LONG TERM GOAL #3   Title Pt will improve ability to don/doff jacket as shown by improving time on PPT#4 (with gown) in 21sec or less.   Baseline 24.84sec    Time 8   Period Weeks   Status New     OT LONG TERM GOAL #4   Title Pt will be able to button/unbutton 3 buttons on tabletop in 35sec or less for incr ease with dressing.   Baseline 43.44sec   Time 8   Period Weeks   Status New     OT LONG TERM GOAL #5   Title Pt will improve bilateral standing functional reach by at least 3" for incr balance for IADLs.   Baseline R-5", L-4"   Time 8   Period Weeks   Status New     OT LONG TERM GOAL #6   Title -------------------------     OT LONG TERM GOAL #7   Title ----------------------               Plan - 12/31/15 1249    Clinical Impression Statement Pt is progressing towards goals for adapted  strategies with ADLS.   Rehab Potential Good   OT Frequency 2x / week   OT Duration 8 weeks   OT Treatment/Interventions Self-care/ADL training;Moist Heat;Electrical Stimulation;Fluidtherapy;Contrast Bath;Passive range of motion;Therapeutic activities;Cognitive remediation/compensation;DME and/or AE instruction;Parrafin;Cryotherapy;Therapeutic exercises;Manual Therapy;Neuromuscular education;Splinting;Ultrasound;Energy conservation;Therapeutic exercise;Functional Mobility Training;Patient/family education;Balance training   Plan continue to reinforce  adapted strategies for ADLS   OT Home Exercise Plan Education issued:  seated PWR!, up, rock and step, supine PWR! basic 4; PWR! hands and coordination HEP, standing PWR!   Consulted and Agree with Plan of Care Patient      Patient will benefit from skilled therapeutic intervention in order to improve the following deficits and impairments:  Decreased mobility, Impaired UE functional use, Decreased knowledge of use of DME, Decreased balance, Decreased coordination, Decreased range of motion, Impaired tone, Decreased cognition, Improper spinal/pelvic alignment, Decreased strength, Impaired flexibility, Decreased activity tolerance  Visit Diagnosis: Other symptoms and signs involving the nervous system  Other symptoms and signs involving the musculoskeletal system  Abnormal posture  Other lack of coordination  Stiffness of left elbow, not elsewhere classified  Stiffness of left shoulder, not elsewhere classified  Stiffness of right elbow, not elsewhere classified    Problem List Patient Active Problem List   Diagnosis Date Noted  . History of colonic polyps   . Hx of adenomatous colonic polyps 01/04/2015  . Lumbar canal stenosis 12/04/2013  . Aortic stenosis 10/21/2013  . Hypoxemia 04/16/2012  . Primary central sleep apnea 04/16/2012  . Obstructive sleep apnea (adult) (pediatric) 04/16/2012  . Orthostatic hypotension 04/16/2012  .  Sleep disturbance, unspecified 04/16/2012  . Unspecified hereditary and idiopathic peripheral neuropathy 04/16/2012  . Parkinson's 04/16/2012  . COLONIC POLYPS, ADENOMATOUS 11/19/2008  . GERD 11/19/2008  . HEMATOCHEZIA 11/19/2008  . GI BLEEDING 11/19/2008  . HYPERLIPIDEMIA-MIXED 07/03/2008  . HYPERTENSION, UNSPECIFIED 07/03/2008    Liala Codispoti 12/31/2015, 12:51 PM  [facility] [address] [place], [place], [address] Phone: [phone]   Fax:  [phone]  Name: [name] MRN: [id number] Date of Birth: [birth year]

## 2016-01-03 ENCOUNTER — Ambulatory Visit: Payer: Medicare Other | Admitting: Occupational Therapy

## 2016-01-03 ENCOUNTER — Ambulatory Visit: Payer: Medicare Other | Admitting: Speech Pathology

## 2016-01-03 DIAGNOSIS — R29898 Other symptoms and signs involving the musculoskeletal system: Secondary | ICD-10-CM

## 2016-01-03 DIAGNOSIS — R293 Abnormal posture: Secondary | ICD-10-CM | POA: Diagnosis not present

## 2016-01-03 DIAGNOSIS — M25622 Stiffness of left elbow, not elsewhere classified: Secondary | ICD-10-CM | POA: Diagnosis not present

## 2016-01-03 DIAGNOSIS — R471 Dysarthria and anarthria: Secondary | ICD-10-CM

## 2016-01-03 DIAGNOSIS — R278 Other lack of coordination: Secondary | ICD-10-CM | POA: Diagnosis not present

## 2016-01-03 DIAGNOSIS — M25612 Stiffness of left shoulder, not elsewhere classified: Secondary | ICD-10-CM | POA: Diagnosis not present

## 2016-01-03 DIAGNOSIS — R29818 Other symptoms and signs involving the nervous system: Secondary | ICD-10-CM | POA: Diagnosis not present

## 2016-01-03 NOTE — Therapy (Signed)
[facility] [address] [place], [place], [address] Phone: [phone]   Fax:  [phone]  Speech Language Pathology Treatment  Patient Details  Name: [name] MRN: [id number] Date of Birth: 1931/12/10 Referring Provider: Star Age, M.D.  Encounter Date: 01/03/2016      End of Session - 01/03/16 1315    Visit Number 7   Number of Visits 17   Date for SLP Re-Evaluation 02/04/16   SLP Start Time [id number]   SLP Stop Time  [id number]   SLP Time Calculation (min) 44 min      Past Medical History:  Diagnosis Date  . Arthritis   . Cancer [facility])    h/o  skin  cancer  . Ex-smoker   . GERD (gastroesophageal reflux disease)   . Hypercholesteremia   . Hypertension   . Lumbar stenosis   . Parkinson's disease ([place])   . Sleep apnea    ?? sleep apnea....tested 2013 @ [facility]     . Sleep disorder     Past Surgical History:  Procedure Laterality Date  . APPENDECTOMY    . BACK SURGERY  11/2013  . COLONOSCOPY  11/17/2008   Rourk: Sessile polyp in the cecum status post saline-assisted piecemeal polypectomy, resolution clipping and epinephrine injection therapy performed. Shallow sigmoid diverticula.. Tubular adenoma  . COLONOSCOPY  11/19/2008   Fields: Large amount of liquid blood with clots seen throughout the colon. Previous Boston resolution clip remained in place. Purple spot seen in the lateral aspect polypectomy site, 3 resolution clips placed here. 6 mm sessile ascending colon polyp seen and not manipulated.  . COLONOSCOPY  11/25/2008   Rourk: Blood-tinged colonic effluent, suspect recurrent post polypectomy bleeding status post placement of 3 clips on the polypectomy site on top of the 3 clips that already existed, one had fallen off since last procedure. One small polyp distal to the cecum not manipulated  . COLONOSCOPY  12/06/2009   Rourk: Internal hemorrhoids, scattered left-sided diverticula,  cecal polyps, one snared and subsequently clipped, 2 adjacent diminutive polyps ablated. tubular adenoma  . COLONOSCOPY N/A 01/21/2015   Dr. Gala Romney: 1.5 cm carpet-type polyp just distal to the ileocecal valve removed piecemeal fashion and ablated. Small polyp in the rectum. Pathology-tubular adenomas  . COLONOSCOPY N/A 09/01/2015   Procedure: COLONOSCOPY;  Surgeon: Daneil Dolin, MD;  [facility];  Service: Endoscopy;  Laterality: N/A;  845  . EYE SURGERY     bilateral  cataracts  . MENISCUS REPAIR     right knee  2000  . POLYPECTOMY  09/01/2015   Procedure: POLYPECTOMY;  Surgeon: Daneil Dolin, MD;  [facility];  Service: Endoscopy;;  colon  . TONSILLECTOMY Bilateral     There were no vitals filed for this visit.      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 No/denies               ADULT SLP TREATMENT - 01/03/16 1234      General Information   Behavior/Cognition Alert;Cooperative;Pleasant mood     Treatment Provided   Treatment provided Cognitive-Linquistic     Cognitive-Linquistic Treatment   Treatment focused on Dysarthria   Skilled Treatment Loud /a/ with "hey" average 88dB with rare min A - Structured speech task explaining idioms with average of 70dB with supervision cues. Mildly complex conversation over 17 minutes  - average of 70dB.  Assessment / Recommendations / Plan   Plan Continue with current plan of care     Progression Toward Goals   Progression toward goals Progressing toward goals          SLP Education - 01/03/16 1309    Education provided No          SLP Short Term Goals - 01/03/16 1314      SLP SHORT TERM GOAL #1   Title pt will phonate loud /a/ average 88dB for 3 sessions   Baseline 12/31/15; 01/03/16;   Time 1   Period Weeks   Status On-going     SLP SHORT TERM GOAL #2   Title pt will produce sentences with average 71dB with WNL voicing    Status Achieved     SLP SHORT TERM GOAL #3   Title pt will maintain loudness at average 70dB with WNL voicing in 5 minutes simple conversation   Status Achieved     SLP SHORT TERM GOAL #4   Title pt will demo abdominal breathing in 5 minutes simple conversation 75% of the time over three sessions   Time 1   Period Weeks   Status On-going          SLP Long Term Goals - 01/03/16 1315      SLP LONG TERM GOAL #1   Title pt will maintain loud /a/ with average 88dB in 6 therapy sessions   Baseline 01/03/16;   Time 4   Period Weeks   Status On-going     SLP LONG TERM GOAL #2   Title pt will use functional voicing in 10 minutes of simple conversation and maintain average 70dB over three sessions   Time 4   Period Weeks   Status On-going     SLP LONG TERM GOAL #3   Title pt will demo abdominal breathing in 5 minutes mod complex conversation 50% of the time in three sessions   Time 4   Period Weeks   Status On-going          Plan - 01/03/16 1314    Speech Therapy Frequency 2x / week   Treatment/Interventions Compensatory strategies;Functional tasks;Cueing hierarchy;Patient/family education;Multimodal communcation approach;Language facilitation;SLP instruction and feedback;Internal/external aids   Potential to Achieve Goals Good   Consulted and Agree with Plan of Care Patient      Patient will benefit from skilled therapeutic intervention in order to improve the following deficits and impairments:   Dysarthria and anarthria    Problem List Patient Active Problem List   Diagnosis Date Noted  . History of colonic polyps   . Hx of adenomatous colonic polyps 01/04/2015  . Lumbar canal stenosis 12/04/2013  . Aortic stenosis 10/21/2013  . Hypoxemia 04/16/2012  . Primary central sleep apnea 04/16/2012  . Obstructive sleep apnea (adult) (pediatric) 04/16/2012  . Orthostatic hypotension 04/16/2012  . Sleep disturbance, unspecified 04/16/2012  . Unspecified hereditary  and idiopathic peripheral neuropathy 04/16/2012  . Parkinson's 04/16/2012  . COLONIC POLYPS, ADENOMATOUS 11/19/2008  . GERD 11/19/2008  . HEMATOCHEZIA 11/19/2008  . GI BLEEDING 11/19/2008  . HYPERLIPIDEMIA-MIXED 07/03/2008  . HYPERTENSION, UNSPECIFIED 07/03/2008    Chaun Uemura, Annye Rusk MS, CCC-SLP 01/03/2016, 1:16 PM  [facility] [address] [place], [place], [address] Phone: [phone]   Fax:  [phone]   Name: [name] MRN: [id number] Date of Birth: 01-Aug-1931

## 2016-01-03 NOTE — Therapy (Signed)
[facility] [address] [place], [place], [address] Phone: [phone]   Fax:  [phone]  Occupational Therapy Treatment  Patient Details  Name: [name] MRN: [id number] Date of Birth: 06-14-1931 Referring Provider: Dr. Star Age  Encounter Date: 01/03/2016      OT End of Session - 01/03/16 1346    Visit Number 7   Number of Visits 17   Date for OT Re-Evaluation 01/22/16   Authorization Type Medicare/[employer]etna (G-code needed)  **KX**   Authorization Time Period cert. 11/23/15-01/22/16   Authorization - Visit Number 7   Authorization - Number of Visits 10   OT Start Time 1316   OT Stop Time 1357   OT Time Calculation (min) 41 min   Activity Tolerance Patient tolerated treatment well   Behavior During Therapy WFL for tasks assessed/performed      Past Medical History:  Diagnosis Date  . Arthritis   . Cancer [facility])    h/o  skin  cancer  . Ex-smoker   . GERD (gastroesophageal reflux disease)   . Hypercholesteremia   . Hypertension   . Lumbar stenosis   . Parkinson's disease ([place])   . Sleep apnea    ?? sleep apnea....tested 2013 @ [facility]     . Sleep disorder     Past Surgical History:  Procedure Laterality Date  . APPENDECTOMY    . BACK SURGERY  11/2013  . COLONOSCOPY  11/17/2008   Rourk: Sessile polyp in the cecum status post saline-assisted piecemeal polypectomy, resolution clipping and epinephrine injection therapy performed. Shallow sigmoid diverticula.. Tubular adenoma  . COLONOSCOPY  11/19/2008   Fields: Large amount of liquid blood with clots seen throughout the colon. Previous Boston resolution clip remained in place. Purple spot seen in the lateral aspect polypectomy site, 3 resolution clips placed here. 6 mm sessile ascending colon polyp seen and not manipulated.  . COLONOSCOPY  11/25/2008   Rourk: Blood-tinged colonic effluent, suspect recurrent post polypectomy bleeding  status post placement of 3 clips on the polypectomy site on top of the 3 clips that already existed, one had fallen off since last procedure. One small polyp distal to the cecum not manipulated  . COLONOSCOPY  12/06/2009   Rourk: Internal hemorrhoids, scattered left-sided diverticula, cecal polyps, one snared and subsequently clipped, 2 adjacent diminutive polyps ablated. tubular adenoma  . COLONOSCOPY N/A 01/21/2015   Dr. Gala Romney: 1.5 cm carpet-type polyp just distal to the ileocecal valve removed piecemeal fashion and ablated. Small polyp in the rectum. Pathology-tubular adenomas  . COLONOSCOPY N/A 09/01/2015   Procedure: COLONOSCOPY;  Surgeon: Daneil Dolin, MD;  [facility];  Service: Endoscopy;  Laterality: N/A;  845  . EYE SURGERY     bilateral  cataracts  . MENISCUS REPAIR     right knee  2000  . POLYPECTOMY  09/01/2015   Procedure: POLYPECTOMY;  Surgeon: Daneil Dolin, MD;  [facility];  Service: Endoscopy;;  colon  . TONSILLECTOMY Bilateral     There were no vitals filed for this visit.      Subjective Assessment - 01/03/16 1315    Subjective  Denies pain   Pertinent History PD diagnosis approx 6 years ago; hx of low back surgery 11/2013; hx of bilateral RTC injuries; HTN, othostatic hypotension, sleep apnea, lumbar stenosis   Patient Stated Goals improving donning coat, improve fine motor skills   Currently in Pain? No/denies  Pt practiced opening containers with big movements, min v,c.  Ambulating while dual tasking(tossing ball then category generation) min v.c. 1 LOB yet pt was able to recover.  Multidirectional stepping to follow written cues, min v.c. For large amplitude movements Arm bike x 6 mins pt maintained 40 rpm           [facility]) - 01/03/16 1400    PWR! exercises Moves in [place]! Up x20   PWR! Rock [employer]! Twist x20   PWR! Step x20   Comments min-mod v.c. for large amplitude movements, modified  over table               OT Short Term Goals - 12/31/15 1103      OT SHORT TERM GOAL #1   Title Pt will be independent with updated PD-specific HEP.--check STGs 12/22/15   Time 4   Period Weeks   Status Achieved     OT SHORT TERM GOAL #2   Title Pt will demo at least 120* L shoulder flex with -20* or greater elbow extension for improved overhead reaching.   Time 4   Period Weeks   Status Achieved     OT SHORT TERM GOAL #3   Title Pt will be able to button/unbutton 3 buttons on tabletop in 40 sec or less for incr ease with dressing.   Time 4   Period Weeks   Status On-going  63.41 secs     OT SHORT TERM GOAL #4   Title Pt will be able to write 3 sentences with 100% accuracy and no significant incr in size.   Time 4   Period Weeks   Status On-going  grossly 90-95% legibility           OT Long Term Goals - 01/03/16 1322      OT LONG TERM GOAL #1   Title Pt will verbalize understanding of updated strategies/AE to incr ease with ADLs prn.--check LTGs 01/22/16   Time 8   Period Weeks   Status On-going     OT LONG TERM GOAL #2   Title Pt will improve bradykinesia, functional reaching, coordination for ADLs as shown by improving score on box and blocks test by at least 4 blocks bilaterally.   Baseline R-46 blocks, L-47 blocks   Status On-going     OT LONG TERM GOAL #3   Title Pt will improve ability to don/doff jacket as shown by improving time on PPT#4 (with gown) in 21sec or less.   Baseline 24.84sec    Time 8   Period Weeks   Status On-going     OT LONG TERM GOAL #4   Title Pt will be able to button/unbutton 3 buttons on tabletop in 35sec or less for incr ease with dressing.   Baseline 43.44sec   Time 8   Period Weeks   Status On-going     OT LONG TERM GOAL #5   Title Pt will improve bilateral standing functional reach by at least 3" for incr balance for IADLs.   Baseline R-5", L-4"   Time 8   Period Weeks   Status On-going     OT LONG TERM GOAL  #6   Title -------------------------     OT LONG TERM GOAL #7   Title ----------------------               Plan - 01/03/16 1344    Clinical Impression Statement Pt is progressing towards goals. He  can benefit from continued review/ repetition of big movements with ADLS/ HEP.   Rehab Potential Good   OT Frequency 2x / week   OT Duration 8 weeks   OT Treatment/Interventions Self-care/ADL training;Moist Heat;Electrical Stimulation;Fluidtherapy;Contrast Bath;Passive range of motion;Therapeutic activities;Cognitive remediation/compensation;DME and/or AE instruction;Parrafin;Cryotherapy;Therapeutic exercises;Manual Therapy;Neuromuscular education;Splinting;Ultrasound;Energy conservation;Therapeutic exercise;Functional Mobility Training;Patient/family education;Balance training   Plan continue to work towards long term goals, review donning/ doffing jacket   OT Home Exercise Plan Education issued:  seated PWR!, up, rock and step, supine PWR! basic 4; PWR! hands and coordination HEP, standing PWR!, modified quadraped basic 4   Consulted and Agree with Plan of Care Patient      Patient will benefit from skilled therapeutic intervention in order to improve the following deficits and impairments:  Decreased mobility, Impaired UE functional use, Decreased knowledge of use of DME, Decreased balance, Decreased coordination, Decreased range of motion, Impaired tone, Decreased cognition, Improper spinal/pelvic alignment, Decreased strength, Impaired flexibility, Decreased activity tolerance  Visit Diagnosis: Other symptoms and signs involving the nervous system  Other symptoms and signs involving the musculoskeletal system  Abnormal posture  Other lack of coordination  Stiffness of left elbow, not elsewhere classified  Stiffness of left shoulder, not elsewhere classified    Problem List Patient Active Problem List   Diagnosis Date Noted  . History of colonic polyps   . Hx of  adenomatous colonic polyps 01/04/2015  . Lumbar canal stenosis 12/04/2013  . Aortic stenosis 10/21/2013  . Hypoxemia 04/16/2012  . Primary central sleep apnea 04/16/2012  . Obstructive sleep apnea (adult) (pediatric) 04/16/2012  . Orthostatic hypotension 04/16/2012  . Sleep disturbance, unspecified 04/16/2012  . Unspecified hereditary and idiopathic peripheral neuropathy 04/16/2012  . Parkinson's 04/16/2012  . COLONIC POLYPS, ADENOMATOUS 11/19/2008  . GERD 11/19/2008  . HEMATOCHEZIA 11/19/2008  . GI BLEEDING 11/19/2008  . HYPERLIPIDEMIA-MIXED 07/03/2008  . HYPERTENSION, UNSPECIFIED 07/03/2008    RINE,KATHRYN 01/03/2016, 6:15 PM  [facility] [address] [place], [place], [address] Phone: [phone]   Fax:  [phone]  Name: [name] MRN: [id number] Date of Birth: 1931/09/08

## 2016-01-13 ENCOUNTER — Ambulatory Visit: Payer: Medicare Other | Admitting: Occupational Therapy

## 2016-01-13 ENCOUNTER — Ambulatory Visit: Payer: Medicare Other

## 2016-01-13 DIAGNOSIS — R471 Dysarthria and anarthria: Secondary | ICD-10-CM

## 2016-01-13 DIAGNOSIS — R293 Abnormal posture: Secondary | ICD-10-CM

## 2016-01-13 DIAGNOSIS — M25621 Stiffness of right elbow, not elsewhere classified: Secondary | ICD-10-CM

## 2016-01-13 DIAGNOSIS — R278 Other lack of coordination: Secondary | ICD-10-CM | POA: Diagnosis not present

## 2016-01-13 DIAGNOSIS — R29818 Other symptoms and signs involving the nervous system: Secondary | ICD-10-CM

## 2016-01-13 DIAGNOSIS — M25612 Stiffness of left shoulder, not elsewhere classified: Secondary | ICD-10-CM | POA: Diagnosis not present

## 2016-01-13 DIAGNOSIS — R29898 Other symptoms and signs involving the musculoskeletal system: Secondary | ICD-10-CM

## 2016-01-13 DIAGNOSIS — M25622 Stiffness of left elbow, not elsewhere classified: Secondary | ICD-10-CM

## 2016-01-13 DIAGNOSIS — R2681 Unsteadiness on feet: Secondary | ICD-10-CM

## 2016-01-13 NOTE — Therapy (Signed)
[facility] [address] [place], [place], [address] Phone: [phone]   Fax:  [phone]  Speech Language Pathology Treatment  Patient Details  Name: [name] MRN: [id number] Date of Birth: Oct 16, 1931 Referring Provider: Star Age, M.D.  Encounter Date: 01/13/2016      End of Session - 01/13/16 1015    Visit Number 8   Number of Visits 17   Date for SLP Re-Evaluation 02/04/16   SLP Start Time 0937   SLP Stop Time  1017   SLP Time Calculation (min) 40 min   Activity Tolerance Patient tolerated treatment well      Past Medical History:  Diagnosis Date  . Arthritis   . Cancer [facility])    h/o  skin  cancer  . Ex-smoker   . GERD (gastroesophageal reflux disease)   . Hypercholesteremia   . Hypertension   . Lumbar stenosis   . Parkinson's disease ([place])   . Sleep apnea    ?? sleep apnea....tested 2013 @ [facility]     . Sleep disorder     Past Surgical History:  Procedure Laterality Date  . APPENDECTOMY    . BACK SURGERY  11/2013  . COLONOSCOPY  11/17/2008   Rourk: Sessile polyp in the cecum status post saline-assisted piecemeal polypectomy, resolution clipping and epinephrine injection therapy performed. Shallow sigmoid diverticula.. Tubular adenoma  . COLONOSCOPY  11/19/2008   Fields: Large amount of liquid blood with clots seen throughout the colon. Previous [place] resolution clip remained in place. Purple spot seen in the lateral aspect polypectomy site, 3 resolution clips placed here. 6 mm sessile ascending colon polyp seen and not manipulated.  . COLONOSCOPY  11/25/2008   Rourk: Blood-tinged colonic effluent, suspect recurrent post polypectomy bleeding status post placement of 3 clips on the polypectomy site on top of the 3 clips that already existed, one had fallen off since last procedure. One small polyp distal to the cecum not manipulated  . COLONOSCOPY  12/06/2009   Rourk:  Internal hemorrhoids, scattered left-sided diverticula, cecal polyps, one snared and subsequently clipped, 2 adjacent diminutive polyps ablated. tubular adenoma  . COLONOSCOPY N/A 01/21/2015   Dr. Gala Romney: 1.5 cm carpet-type polyp just distal to the ileocecal valve removed piecemeal fashion and ablated. Small polyp in the rectum. Pathology-tubular adenomas  . COLONOSCOPY N/A 09/01/2015   Procedure: COLONOSCOPY;  Surgeon: Daneil Dolin, MD;  [facility];  Service: Endoscopy;  Laterality: N/A;  845  . EYE SURGERY     bilateral  cataracts  . MENISCUS REPAIR     right knee  2000  . POLYPECTOMY  09/01/2015   Procedure: POLYPECTOMY;  Surgeon: Daneil Dolin, MD;  [facility];  Service: Endoscopy;;  colon  . TONSILLECTOMY Bilateral     There were no vitals filed for this visit.      Subjective Assessment - 01/13/16 0946    Subjective Pt states he is doing loud /a/ 4-5 reps, 1-2 times per day               ADULT SLP TREATMENT - 01/13/16 0947      General Information   Behavior/Cognition Alert;Cooperative;Pleasant mood     Treatment Provided   Treatment provided Cognitive-Linquistic     Cognitive-Linquistic Treatment   Treatment focused on Dysarthria   Skilled Treatment Loud "hey-ahhhhhhh" with average 90dB with initial min cues faded to SBA. SLP reiterated need to do loud /a/ every day, twice a day,  5 reps each set. In conversation (min-mod complex) of 10 minutes, pt maintained 71dB independently, and outside [place] room while at table in gym and walking pt maintained intelligible voice 100% of the time with notable abdominal push approx 85% of the time.       Assessment / Recommendations / Plan   Plan Continue with current plan of care     Progression Toward Goals   Progression toward goals Progressing toward goals          SLP Education - 01/13/16 1015    Education provided Yes   Education Details loud /a/ frequency    Person(s) Educated Patient    Methods Explanation   Comprehension Verbalized understanding          SLP Short Term Goals - 01/13/16 1017      SLP SHORT TERM GOAL #1   Title pt will phonate loud /a/ average 88dB for 3 sessions   Status Achieved     SLP SHORT TERM GOAL #2   Title pt will produce sentences with average 71dB with WNL voicing   Status Achieved     SLP SHORT TERM GOAL #3   Title pt will maintain loudness at average 70dB with WNL voicing in 5 minutes simple conversation   Status Achieved     SLP SHORT TERM GOAL #4   Title pt will demo abdominal breathing in 5 minutes simple conversation 75% of the time over three sessions   Baseline 01-13-16   Time 1   Period Weeks   Status On-going          SLP Long Term Goals - 01/13/16 1023      SLP LONG TERM GOAL #1   Title pt will maintain loud /a/ with average 88dB in 6 therapy sessions   Baseline 01/03/16, 01-13-16   Time 3   Period Weeks   Status On-going     SLP LONG TERM GOAL #2   Title pt will use functional voicing in 10 minutes of simple conversation and maintain average 70dB over three sessions   Baseline 01-13-16   Time 3   Period Weeks   Status On-going     SLP LONG TERM GOAL #3   Title pt will demo abdominal breathing in 5 minutes mod complex conversation 50% of the time in three sessions   Baseline 01-13-16   Time 4   Period Weeks   Status On-going          Plan - 01/13/16 1016    Clinical Impression Statement Pt continues to make progress in conversational speech - maintaining volume over longer time periods and also while adding distractors. Continue skilled ST to maximize carryover of volume and intellgibiltiy. SLP spoke today re: external cues for pt to maintain speech loudness over the day.   Speech Therapy Frequency 2x / week   Duration --  8 weeks - pt choosing to schedule once/week   Treatment/Interventions Compensatory strategies;Functional tasks;Cueing hierarchy;Patient/family education;Multimodal communcation  approach;Language facilitation;SLP instruction and feedback;Internal/external aids   Potential to Achieve Goals Good      Patient will benefit from skilled therapeutic intervention in order to improve the following deficits and impairments:   Dysarthria and anarthria    Problem List Patient Active Problem List   Diagnosis Date Noted  . History of colonic polyps   . Hx of adenomatous colonic polyps 01/04/2015  . Lumbar canal stenosis 12/04/2013  . Aortic stenosis 10/21/2013  . Hypoxemia 04/16/2012  . Primary central sleep apnea  04/16/2012  . Obstructive sleep apnea (adult) (pediatric) 04/16/2012  . Orthostatic hypotension 04/16/2012  . Sleep disturbance, unspecified 04/16/2012  . Unspecified hereditary and idiopathic peripheral neuropathy 04/16/2012  . Parkinson's 04/16/2012  . COLONIC POLYPS, ADENOMATOUS 11/19/2008  . GERD 11/19/2008  . HEMATOCHEZIA 11/19/2008  . GI BLEEDING 11/19/2008  . HYPERLIPIDEMIA-MIXED 07/03/2008  . HYPERTENSION, UNSPECIFIED 07/03/2008    [facility], CCC-SLP  01/13/2016, 5:08 PM  [facility] [address] [place], [place], [address] Phone: [phone]   Fax:  [phone]   Name: [name] MRN: [id number] Date of Birth: Aug 22, 1931

## 2016-01-13 NOTE — Therapy (Signed)
[facility] [address] [place], [place], [address] Phone: [phone]   Fax:  [phone]  Occupational Therapy Treatment  Patient Details  Name: [name] MRN: [id number] Date of Birth: [birth year] Referring Provider: Dr. Star Age  Encounter Date: 01/13/2016      OT End of Session - 01/13/16 1302    Visit Number 8   Number of Visits 17   Date for OT Re-Evaluation 01/22/16   Authorization Type Medicare/Aetna (G-code needed)  **KX**   Authorization Time Period cert. 11/23/15-01/22/16   Authorization - Visit Number 8   Authorization - Number of Visits 10   OT Start Time 1022   OT Stop Time 1103   OT Time Calculation (min) 41 min   Activity Tolerance Patient tolerated treatment well   Behavior During Therapy WFL for tasks assessed/performed      Past Medical History:  Diagnosis Date  . Arthritis   . Cancer [facility])    h/o  skin  cancer  . Ex-smoker   . GERD (gastroesophageal reflux disease)   . Hypercholesteremia   . Hypertension   . Lumbar stenosis   . Parkinson's disease ([place])   . Sleep apnea    ?? sleep apnea....tested 2013 @ [facility]     . Sleep disorder     Past Surgical History:  Procedure Laterality Date  . APPENDECTOMY    . BACK SURGERY  11/2013  . COLONOSCOPY  11/17/2008   Rourk: Sessile polyp in the cecum status post saline-assisted piecemeal polypectomy, resolution clipping and epinephrine injection therapy performed. Shallow sigmoid diverticula.. Tubular adenoma  . COLONOSCOPY  11/19/2008   Fields: Large amount of liquid blood with clots seen throughout the colon. Previous Boston resolution clip remained in place. Purple spot seen in the lateral aspect polypectomy site, 3 resolution clips placed here. 6 mm sessile ascending colon polyp seen and not manipulated.  . COLONOSCOPY  11/25/2008   Rourk: Blood-tinged colonic effluent, suspect recurrent post polypectomy bleeding  status post placement of 3 clips on the polypectomy site on top of the 3 clips that already existed, one had fallen off since last procedure. One small polyp distal to the cecum not manipulated  . COLONOSCOPY  12/06/2009   Rourk: Internal hemorrhoids, scattered left-sided diverticula, cecal polyps, one snared and subsequently clipped, 2 adjacent diminutive polyps ablated. tubular adenoma  . COLONOSCOPY N/A 01/21/2015   Dr. Gala Romney: 1.5 cm carpet-type polyp just distal to the ileocecal valve removed piecemeal fashion and ablated. Small polyp in the rectum. Pathology-tubular adenomas  . COLONOSCOPY N/A 09/01/2015   Procedure: COLONOSCOPY;  Surgeon: Daneil Dolin, MD;  [facility];  Service: Endoscopy;  Laterality: N/A;  845  . EYE SURGERY     bilateral  cataracts  . MENISCUS REPAIR     right knee  2000  . POLYPECTOMY  09/01/2015   Procedure: POLYPECTOMY;  Surgeon: Daneil Dolin, MD;  Location[facility];  Service: Endoscopy;;  colon  . TONSILLECTOMY Bilateral     There were no vitals filed for this visit.      Subjective Assessment - 01/13/16 1027    Subjective  "pulling hard helps" (with donning jacket)   Pertinent History PD diagnosis approx 6 years ago; hx of low back surgery 11/2013; hx of bilateral RTC injuries; HTN, othostatic hypotension, sleep apnea, lumbar stenosis   Patient Stated Goals improving donning coat, improve fine motor skills   Currently in Pain? No/denies  Neuro re-ed:  PWR! Moves (basic 4) in supine x 20 each with min cues For incr movement amplitude.    Self Care:    Dressing:   Practiced donning/doffing jacket using large amplitude movement strategies after initial instruction.  Pt demo improvement with repetition and use/min-mod cues for large amplitude movements (hold collar with collar folded out, big push with arms, big pull across, and open hand to pull jacket down).       Functional reaching to grasp/release cylinder objects  with trunk rotation and wt. Shift with min cues for large amplitude movements/PWR! Hands with RUE and mod cueing for LUE.  In sitting, functional reaching to grasp large cards with PWR! Hands and performing trunk rotation to flip large cards with focus on finger extension and supination, trunk rotation and head turns with min cueing for large amplitude movements, particularly with LUE.                               OT Short Term Goals - 12/31/15 1103      OT SHORT TERM GOAL #1   Title Pt will be independent with updated PD-specific HEP.--check STGs 12/22/15   Time 4   Period Weeks   Status Achieved     OT SHORT TERM GOAL #2   Title Pt will demo at least 120* L shoulder flex with -20* or greater elbow extension for improved overhead reaching.   Time 4   Period Weeks   Status Achieved     OT SHORT TERM GOAL #3   Title Pt will be able to button/unbutton 3 buttons on tabletop in 40 sec or less for incr ease with dressing.   Time 4   Period Weeks   Status On-going  63.41 secs     OT SHORT TERM GOAL #4   Title Pt will be able to write 3 sentences with 100% accuracy and no significant incr in size.   Time 4   Period Weeks   Status On-going  grossly 90-95% legibility           OT Long Term Goals - 01/13/16 1028      OT LONG TERM GOAL #1   Title Pt will verbalize understanding of updated strategies/AE to incr ease with ADLs prn.--check LTGs 01/22/16   Time 8   Period Weeks   Status On-going     OT LONG TERM GOAL #2   Title Pt will improve bradykinesia, functional reaching, coordination for ADLs as shown by improving score on box and blocks test by at least 4 blocks bilaterally.   Baseline R-46 blocks, L-47 blocks   Status On-going     OT LONG TERM GOAL #3   Title Pt will improve ability to don/doff jacket as shown by improving time on PPT#4 (with gown) in 21sec or less.   Baseline 24.84sec    Time 8   Period Weeks   Status On-going     OT LONG  TERM GOAL #4   Title Pt will be able to button/unbutton 3 buttons on tabletop in 35sec or less for incr ease with dressing.   Baseline 43.44sec   Time 8   Period Weeks   Status On-going     OT LONG TERM GOAL #5   Title Pt will improve bilateral standing functional reach by at least 3" for incr balance for IADLs.   Baseline R-5", L-4"   Time 8   Period Weeks  Status On-going     OT LONG TERM GOAL #6   Title -------------------------     OT LONG TERM GOAL #7   Title ----------------------               Plan - 01/13/16 1028    Clinical Impression Statement Pt is progressing towards goals, but continues to need reinforcement for use of large amplitude movements for ADLs.   Rehab Potential Good   OT Frequency 2x / week   OT Duration 8 weeks   OT Treatment/Interventions Self-care/ADL training;Moist Heat;Electrical Stimulation;Fluidtherapy;Contrast Bath;Passive range of motion;Therapeutic activities;Cognitive remediation/compensation;DME and/or AE instruction;Parrafin;Cryotherapy;Therapeutic exercises;Manual Therapy;Neuromuscular education;Splinting;Ultrasound;Energy conservation;Therapeutic exercise;Functional Mobility Training;Patient/family education;Balance training   Plan large amplitude movements for functional tasks/ADLs (?handout) including sit>stand   OT Home Exercise Plan Education issued:  seated PWR!, up, rock and step, supine PWR! basic 4; PWR! hands and coordination HEP, standing PWR!, modified quadraped basic 4   Consulted and Agree with Plan of Care Patient      Patient will benefit from skilled therapeutic intervention in order to improve the following deficits and impairments:  Decreased mobility, Impaired UE functional use, Decreased knowledge of use of DME, Decreased balance, Decreased coordination, Decreased range of motion, Impaired tone, Decreased cognition, Improper spinal/pelvic alignment, Decreased strength, Impaired flexibility, Decreased activity  tolerance  Visit Diagnosis: Other symptoms and signs involving the nervous system  Other symptoms and signs involving the musculoskeletal system  Abnormal posture  Other lack of coordination  Stiffness of left elbow, not elsewhere classified  Stiffness of left shoulder, not elsewhere classified  Stiffness of right elbow, not elsewhere classified  Unsteadiness    Problem List Patient Active Problem List   Diagnosis Date Noted  . History of colonic polyps   . Hx of adenomatous colonic polyps 01/04/2015  . Lumbar canal stenosis 12/04/2013  . Aortic stenosis 10/21/2013  . Hypoxemia 04/16/2012  . Primary central sleep apnea 04/16/2012  . Obstructive sleep apnea (adult) (pediatric) 04/16/2012  . Orthostatic hypotension 04/16/2012  . Sleep disturbance, unspecified 04/16/2012  . Unspecified hereditary and idiopathic peripheral neuropathy 04/16/2012  . Parkinson's 04/16/2012  . COLONIC POLYPS, ADENOMATOUS 11/19/2008  . GERD 11/19/2008  . HEMATOCHEZIA 11/19/2008  . GI BLEEDING 11/19/2008  . HYPERLIPIDEMIA-MIXED 07/03/2008  . HYPERTENSION, UNSPECIFIED 07/03/2008    [facility] 01/13/2016, 1:04 PM  [facility] [address] [place], [place], [address] Phone: [phone]   Fax:  [phone]  Name: [name] MRN: [id number] Date of Birth: October 30, 1931   Vianne Bulls, OTR/L [facility] [address] [place], [place]  [address] [phone] phone [phone] 01/13/16 1:04 PM

## 2016-01-19 ENCOUNTER — Ambulatory Visit: Payer: Medicare Other

## 2016-01-19 ENCOUNTER — Ambulatory Visit: Payer: Medicare Other | Attending: Neurology | Admitting: Occupational Therapy

## 2016-01-19 DIAGNOSIS — R29898 Other symptoms and signs involving the musculoskeletal system: Secondary | ICD-10-CM

## 2016-01-19 DIAGNOSIS — R471 Dysarthria and anarthria: Secondary | ICD-10-CM | POA: Insufficient documentation

## 2016-01-19 DIAGNOSIS — R1310 Dysphagia, unspecified: Secondary | ICD-10-CM | POA: Diagnosis not present

## 2016-01-19 DIAGNOSIS — M25621 Stiffness of right elbow, not elsewhere classified: Secondary | ICD-10-CM | POA: Diagnosis not present

## 2016-01-19 DIAGNOSIS — R293 Abnormal posture: Secondary | ICD-10-CM

## 2016-01-19 DIAGNOSIS — R29818 Other symptoms and signs involving the nervous system: Secondary | ICD-10-CM | POA: Diagnosis not present

## 2016-01-19 DIAGNOSIS — R278 Other lack of coordination: Secondary | ICD-10-CM | POA: Diagnosis not present

## 2016-01-19 DIAGNOSIS — M25612 Stiffness of left shoulder, not elsewhere classified: Secondary | ICD-10-CM

## 2016-01-19 DIAGNOSIS — M25622 Stiffness of left elbow, not elsewhere classified: Secondary | ICD-10-CM | POA: Diagnosis not present

## 2016-01-19 NOTE — Patient Instructions (Signed)

## 2016-01-19 NOTE — Therapy (Signed)
[facility] [address] [place], [place], [address] Phone: [phone]   Fax:  [phone]  Speech Language Pathology Treatment  Patient Details  Name: [name] MRN: [id number] Date of Birth: May 27, 1931 Referring Provider: Star Age, M.D.  Encounter Date: 01/19/2016      End of Session - 01/19/16 1103    Visit Number 9   Number of Visits 17   Date for SLP Re-Evaluation 02/04/16   SLP Start Time 16   SLP Stop Time  1100   SLP Time Calculation (min) 40 min   Activity Tolerance Patient tolerated treatment well      Past Medical History:  Diagnosis Date  . Arthritis   . Cancer [facility])    h/o  skin  cancer  . Ex-smoker   . GERD (gastroesophageal reflux disease)   . Hypercholesteremia   . Hypertension   . Lumbar stenosis   . Parkinson's disease ([place])   . Sleep apnea    ?? sleep apnea....tested 2013 @ [facility]     . Sleep disorder     Past Surgical History:  Procedure Laterality Date  . APPENDECTOMY    . BACK SURGERY  11/2013  . COLONOSCOPY  11/17/2008   Rourk: Sessile polyp in the cecum status post saline-assisted piecemeal polypectomy, resolution clipping and epinephrine injection therapy performed. Shallow sigmoid diverticula.. Tubular adenoma  . COLONOSCOPY  11/19/2008   Fields: Large amount of liquid blood with clots seen throughout the colon. Previous [place] resolution clip remained in place. Purple spot seen in the lateral aspect polypectomy site, 3 resolution clips placed here. 6 mm sessile ascending colon polyp seen and not manipulated.  . COLONOSCOPY  11/25/2008   Rourk: Blood-tinged colonic effluent, suspect recurrent post polypectomy bleeding status post placement of 3 clips on the polypectomy site on top of the 3 clips that already existed, one had fallen off since last procedure. One small polyp distal to the cecum not manipulated  . COLONOSCOPY  12/06/2009   Rourk:  Internal hemorrhoids, scattered left-sided diverticula, cecal polyps, one snared and subsequently clipped, 2 adjacent diminutive polyps ablated. tubular adenoma  . COLONOSCOPY N/A 01/21/2015   Dr. Gala Romney: 1.5 cm carpet-type polyp just distal to the ileocecal valve removed piecemeal fashion and ablated. Small polyp in the rectum. Pathology-tubular adenomas  . COLONOSCOPY N/A 09/01/2015   Procedure: COLONOSCOPY;  Surgeon: Daneil Dolin, MD;  [facility];  Service: Endoscopy;  Laterality: N/A;  845  . EYE SURGERY     bilateral  cataracts  . MENISCUS REPAIR     right knee  2000  . POLYPECTOMY  09/01/2015   Procedure: POLYPECTOMY;  Surgeon: Daneil Dolin, MD;  [facility];  Service: Endoscopy;;  colon  . TONSILLECTOMY Bilateral     There were no vitals filed for this visit.      Subjective Assessment - 01/19/16 1028    Subjective Pt more consistently doing loud /a/ BID since last session.   Currently in Pain? No/denies               ADULT SLP TREATMENT - 01/19/16 1029      General Information   Behavior/Cognition Alert;Cooperative;Pleasant mood     Treatment Provided   Treatment provided Cognitive-Linquistic     Cognitive-Linquistic Treatment   Treatment focused on Dysarthria   Skilled Treatment SLP targeted louder conversational speech with "Hey - ahhhh" to habitualize abdominal push and effort needed for WNL conversational volume. Pt  averaged 87dB today with usual cues for sustaining loudness. SLP stressed again need for BID completion of these. Conversation for 10 minutes in the therapy room with a min-mod complex topic resulted in conversational average of low 70s dB. In a 10 minute mod complex-complex conversation in the gym (mod distracting and noisy environment) pt maintained loudness in the upper 60s-low 70s dB.      Assessment / Recommendations / Plan   Plan Continue with current plan of care     Progression Toward Goals   Progression toward  goals Progressing toward goals            SLP Short Term Goals - 01/19/16 1105      SLP SHORT TERM GOAL #1   Title pt will phonate loud /a/ average 88dB for 3 sessions   Status Achieved     SLP SHORT TERM GOAL #2   Title pt will produce sentences with average 71dB with WNL voicing   Status Achieved     SLP SHORT TERM GOAL #3   Title pt will maintain loudness at average 70dB with WNL voicing in 5 minutes simple conversation   Status Achieved     SLP SHORT TERM GOAL #4   Title pt will demo abdominal breathing in 5 minutes simple conversation 75% of the time over three sessions   Baseline 01-13-16, 01-19-16   Time 1   Period Weeks   Status On-going          SLP Long Term Goals - 01/19/16 1105      SLP LONG TERM GOAL #1   Title pt will maintain loud /a/ with average 88dB in 6 therapy sessions   Baseline 01/03/16, 01-13-16   Time 2   Period Weeks   Status On-going     SLP LONG TERM GOAL #2   Title pt will use functional voicing in 10 minutes of simple conversation and maintain average 70dB over three sessions   Baseline 01-13-16, 01-19-16   Time 2   Period Weeks   Status On-going     SLP LONG TERM GOAL #3   Title pt will demo abdominal breathing in 5 minutes mod complex conversation 50% of the time in three sessions   Baseline 01-13-16, 01-19-16   Time 3   Period Weeks   Status On-going          Plan - 01/19/16 1104    Clinical Impression Statement Pt continues to make progress in conversational speech - maintaining volume over longer time periods and also while adding distractors. In mod complex to complex conversation pt volume decr'd compared to simple to mod complex conversation. Continue skilled ST to maximize carryover of volume and intellgibiltiy. SLP spoke today re: external cues for pt to maintain speech loudness over the day.   Speech Therapy Frequency 2x / week   Duration --  8 weeks - pt choosing to schedule once/week   Treatment/Interventions  Compensatory strategies;Functional tasks;Cueing hierarchy;Patient/family education;Multimodal communcation approach;Language facilitation;SLP instruction and feedback;Internal/external aids   Potential to Achieve Goals Good      Patient will benefit from skilled therapeutic intervention in order to improve the following deficits and impairments:   Dysarthria and anarthria    Problem List Patient Active Problem List   Diagnosis Date Noted  . History of colonic polyps   . Hx of adenomatous colonic polyps 01/04/2015  . Lumbar canal stenosis 12/04/2013  . Aortic stenosis 10/21/2013  . Hypoxemia 04/16/2012  . Primary central sleep apnea 04/16/2012  .  Obstructive sleep apnea (adult) (pediatric) 04/16/2012  . Orthostatic hypotension 04/16/2012  . Sleep disturbance, unspecified 04/16/2012  . Unspecified hereditary and idiopathic peripheral neuropathy 04/16/2012  . Parkinson's 04/16/2012  . COLONIC POLYPS, ADENOMATOUS 11/19/2008  . GERD 11/19/2008  . HEMATOCHEZIA 11/19/2008  . GI BLEEDING 11/19/2008  . HYPERLIPIDEMIA-MIXED 07/03/2008  . HYPERTENSION, UNSPECIFIED 07/03/2008    [facility] ,[place], CCC-SLP  01/19/2016, 11:06 AM  [facility] [address] [place], [place], [address] Phone: [phone]   Fax:  [phone]   Name: [name] MRN: [id number] Date of Birth: 04/29/1931

## 2016-01-19 NOTE — Therapy (Signed)
[facility] [address] [place], [place], [address] Phone: [phone]   Fax:  [phone]  Occupational Therapy Treatment  Patient Details  Name: [name] MRN: [id number] Date of Birth: Feb 04, 1931 Referring Provider: Dr. Star Age  Encounter Date: 01/19/2016      OT End of Session - 01/19/16 1132    Visit Number 9   Number of Visits 17   Date for OT Re-Evaluation 01/22/16   Authorization Type Medicare/Aetna (G-code needed)  **KX**   Authorization Time Period cert. 11/23/15-01/22/16   Authorization - Visit Number 9   Authorization - Number of Visits 10   OT Start Time [phone]   OT Stop Time 1015   OT Time Calculation (min) 39 min   Activity Tolerance Patient tolerated treatment well   Behavior During Therapy WFL for tasks assessed/performed      Past Medical History:  Diagnosis Date  . Arthritis   . Cancer [facility])    h/o  skin  cancer  . Ex-smoker   . GERD (gastroesophageal reflux disease)   . Hypercholesteremia   . Hypertension   . Lumbar stenosis   . Parkinson's disease ([place])   . Sleep apnea    ?? sleep apnea....tested 2013 @ [facility]     . Sleep disorder     Past Surgical History:  Procedure Laterality Date  . APPENDECTOMY    . BACK SURGERY  11/2013  . COLONOSCOPY  11/17/2008   Rourk: Sessile polyp in the cecum status post saline-assisted piecemeal polypectomy, resolution clipping and epinephrine injection therapy performed. Shallow sigmoid diverticula.. Tubular adenoma  . COLONOSCOPY  11/19/2008   Fields: Large amount of liquid blood with clots seen throughout the colon. Previous Boston resolution clip remained in place. Purple spot seen in the lateral aspect polypectomy site, 3 resolution clips placed here. 6 mm sessile ascending colon polyp seen and not manipulated.  . COLONOSCOPY  11/25/2008   Rourk: Blood-tinged colonic effluent, suspect recurrent post polypectomy bleeding  status post placement of 3 clips on the polypectomy site on top of the 3 clips that already existed, one had fallen off since last procedure. One small polyp distal to the cecum not manipulated  . COLONOSCOPY  12/06/2009   Rourk: Internal hemorrhoids, scattered left-sided diverticula, cecal polyps, one snared and subsequently clipped, 2 adjacent diminutive polyps ablated. tubular adenoma  . COLONOSCOPY N/A 01/21/2015   Dr. Gala [name]: 1.5 cm carpet-type polyp just distal to the ileocecal valve removed piecemeal fashion and ablated. Small polyp in the rectum. Pathology-tubular adenomas  . COLONOSCOPY N/A 09/01/2015   Procedure: COLONOSCOPY;  Surgeon: Daneil Dolin, MD;  [facility];  Service: Endoscopy;  Laterality: N/A;  845  . EYE SURGERY     bilateral  cataracts  . MENISCUS REPAIR     right knee  2000  . POLYPECTOMY  09/01/2015   Procedure: POLYPECTOMY;  Surgeon: Daneil Dolin, MD;  [facility];  Service: Endoscopy;;  colon  . TONSILLECTOMY Bilateral     There were no vitals filed for this visit.      Subjective Assessment - 01/19/16 0937    Subjective  It's going well   Pertinent History PD diagnosis approx 6 years ago; hx of low back surgery 11/2013; hx of bilateral RTC injuries; HTN, othostatic hypotension, sleep apnea, lumbar stenosis   Patient Stated Goals improving donning coat, improve fine motor skills   Currently in Pain? No/denies  Pt was educated regarding big movements with ADLS and provided with handout, pt verbalized understanding. Pt practiced cutting food with larger movements, min v.c. Simulated ADLs ( with bag exercises): simulated pulling up socking, tucking in shirt, drying back and donning a pullover shirt, min v.c. For large amplitude movements. Picking up coins with bilateral UE's, and stacking then manipulating in hand to put coins in piggy bank with each hand, min-mod difficulty holding coins in hand while placing an  individual coin in piggy bank.                   OT Short Term Goals - 12/31/15 1103      OT SHORT TERM GOAL #1   Title Pt will be independent with updated PD-specific HEP.--check STGs 12/22/15   Time 4   Period Weeks   Status Achieved     OT SHORT TERM GOAL #2   Title Pt will demo at least 120* L shoulder flex with -20* or greater elbow extension for improved overhead reaching.   Time 4   Period Weeks   Status Achieved     OT SHORT TERM GOAL #3   Title Pt will be able to button/unbutton 3 buttons on tabletop in 40 sec or less for incr ease with dressing.   Time 4   Period Weeks   Status On-going  63.41 secs     OT SHORT TERM GOAL #4   Title Pt will be able to write 3 sentences with 100% accuracy and no significant incr in size.   Time 4   Period Weeks   Status On-going  grossly 90-95% legibility           OT Long Term Goals - 01/13/16 1028      OT LONG TERM GOAL #1   Title Pt will verbalize understanding of updated strategies/AE to incr ease with ADLs prn.--check LTGs 01/22/16   Time 8   Period Weeks   Status On-going     OT LONG TERM GOAL #2   Title Pt will improve bradykinesia, functional reaching, coordination for ADLs as shown by improving score on box and blocks test by at least 4 blocks bilaterally.   Baseline R-46 blocks, L-47 blocks   Status On-going     OT LONG TERM GOAL #3   Title Pt will improve ability to don/doff jacket as shown by improving time on PPT#4 (with gown) in 21sec or less.   Baseline 24.84sec    Time 8   Period Weeks   Status On-going     OT LONG TERM GOAL #4   Title Pt will be able to button/unbutton 3 buttons on tabletop in 35sec or less for incr ease with dressing.   Baseline 43.44sec   Time 8   Period Weeks   Status On-going     OT LONG TERM GOAL #5   Title Pt will improve bilateral standing functional reach by at least 3" for incr balance for IADLs.   Baseline R-5", L-4"   Time 8   Period Weeks   Status  On-going     OT LONG TERM GOAL #6   Title -------------------------     OT LONG TERM GOAL #7   Title ----------------------               Plan - 01/19/16 1129    Clinical Impression Statement Pt is progressing towards goals. He can benefit from continued reinforcement of large amplitude movments for ADLS and adapted strategies.  Rehab Potential Good   OT Frequency 2x / week   OT Duration 8 weeks   Plan continue to work towards long term goals   OT Home Exercise Plan Education issued:  seated PWR!, up, rock and step, supine PWR! basic 4; PWR! hands and coordination HEP, standing PWR!, modified quadraped basic 4   Consulted and Agree with Plan of Care Patient      Patient will benefit from skilled therapeutic intervention in order to improve the following deficits and impairments:  Decreased mobility, Impaired UE functional use, Decreased knowledge of use of DME, Decreased balance, Decreased coordination, Decreased range of motion, Impaired tone, Decreased cognition, Improper spinal/pelvic alignment, Decreased strength, Impaired flexibility, Decreased activity tolerance  Visit Diagnosis: Other symptoms and signs involving the nervous system  Other symptoms and signs involving the musculoskeletal system  Abnormal posture  Other lack of coordination  Stiffness of left elbow, not elsewhere classified  Stiffness of left shoulder, not elsewhere classified  Stiffness of right elbow, not elsewhere classified    Problem List Patient Active Problem List   Diagnosis Date Noted  . History of colonic polyps   . Hx of adenomatous colonic polyps 01/04/2015  . Lumbar canal stenosis 12/04/2013  . Aortic stenosis 10/21/2013  . Hypoxemia 04/16/2012  . Primary central sleep apnea 04/16/2012  . Obstructive sleep apnea (adult) (pediatric) 04/16/2012  . Orthostatic hypotension 04/16/2012  . Sleep disturbance, unspecified 04/16/2012  . Unspecified hereditary and idiopathic  peripheral neuropathy 04/16/2012  . Parkinson's 04/16/2012  . COLONIC POLYPS, ADENOMATOUS 11/19/2008  . GERD 11/19/2008  . HEMATOCHEZIA 11/19/2008  . GI BLEEDING 11/19/2008  . HYPERLIPIDEMIA-MIXED 07/03/2008  . HYPERTENSION, UNSPECIFIED 07/03/2008    RINE,KATHRYN 01/19/2016, 11:33 AM Theone Murdoch, OTR/L [phone] Phone: [phone] 11:38 AM 01/19/16 [facility] [address] [place], [place], [address] Phone: [phone]   Fax:  [phone]  Name: JOSEJUAN STAGNARO MRN: [id number] Date of Birth: [birth year]

## 2016-01-21 ENCOUNTER — Ambulatory Visit: Payer: Medicare Other

## 2016-01-21 ENCOUNTER — Ambulatory Visit: Payer: Medicare Other | Admitting: Occupational Therapy

## 2016-01-21 DIAGNOSIS — R29818 Other symptoms and signs involving the nervous system: Secondary | ICD-10-CM

## 2016-01-21 DIAGNOSIS — M25612 Stiffness of left shoulder, not elsewhere classified: Secondary | ICD-10-CM

## 2016-01-21 DIAGNOSIS — M25622 Stiffness of left elbow, not elsewhere classified: Secondary | ICD-10-CM

## 2016-01-21 DIAGNOSIS — R278 Other lack of coordination: Secondary | ICD-10-CM | POA: Diagnosis not present

## 2016-01-21 DIAGNOSIS — R293 Abnormal posture: Secondary | ICD-10-CM | POA: Diagnosis not present

## 2016-01-21 DIAGNOSIS — R29898 Other symptoms and signs involving the musculoskeletal system: Secondary | ICD-10-CM

## 2016-01-21 DIAGNOSIS — R471 Dysarthria and anarthria: Secondary | ICD-10-CM

## 2016-01-21 DIAGNOSIS — R1310 Dysphagia, unspecified: Secondary | ICD-10-CM

## 2016-01-21 DIAGNOSIS — M25621 Stiffness of right elbow, not elsewhere classified: Secondary | ICD-10-CM

## 2016-01-21 NOTE — Patient Instructions (Signed)
Think about what external cue you can use to help you remember to use louder speech.

## 2016-01-21 NOTE — Therapy (Signed)
[facility] [address] [place], [place], [address] Phone: [phone]   Fax:  [phone]  Speech Language Pathology Treatment  Patient Details  Name: [name] MRN: [id number] Date of Birth: 09-19-1931 Referring Provider: Star Age, M.D.  Encounter Date: 01/21/2016      End of Session - 01/21/16 1102    Visit Number 10   Number of Visits 17   Date for SLP Re-Evaluation 02/04/16      Past Medical History:  Diagnosis Date  . Arthritis   . Cancer [facility])    h/o  skin  cancer  . Ex-smoker   . GERD (gastroesophageal reflux disease)   . Hypercholesteremia   . Hypertension   . Lumbar stenosis   . Parkinson's disease ([place])   . Sleep apnea    ?? sleep apnea....tested 2013 @ [facility]     . Sleep disorder     Past Surgical History:  Procedure Laterality Date  . APPENDECTOMY    . BACK SURGERY  11/2013  . COLONOSCOPY  11/17/2008   Rourk: Sessile polyp in the cecum status post saline-assisted piecemeal polypectomy, resolution clipping and epinephrine injection therapy performed. Shallow sigmoid diverticula.. Tubular adenoma  . COLONOSCOPY  11/19/2008   Fields: Large amount of liquid blood with clots seen throughout the colon. Previous Boston resolution clip remained in place. Purple spot seen in the lateral aspect polypectomy site, 3 resolution clips placed here. 6 mm sessile ascending colon polyp seen and not manipulated.  . COLONOSCOPY  11/25/2008   Rourk: Blood-tinged colonic effluent, suspect recurrent post polypectomy bleeding status post placement of 3 clips on the polypectomy site on top of the 3 clips that already existed, one had fallen off since last procedure. One small polyp distal to the cecum not manipulated  . COLONOSCOPY  12/06/2009   Rourk: Internal hemorrhoids, scattered left-sided diverticula, cecal polyps, one snared and subsequently clipped, 2 adjacent diminutive polyps  ablated. tubular adenoma  . COLONOSCOPY N/A 01/21/2015   Dr. Gala Romney: 1.5 cm carpet-type polyp just distal to the ileocecal valve removed piecemeal fashion and ablated. Small polyp in the rectum. Pathology-tubular adenomas  . COLONOSCOPY N/A 09/01/2015   Procedure: COLONOSCOPY;  Surgeon: Daneil Dolin, MD;  [facility];  Service: Endoscopy;  Laterality: N/A;  845  . EYE SURGERY     bilateral  cataracts  . MENISCUS REPAIR     right knee  2000  . POLYPECTOMY  09/01/2015   Procedure: POLYPECTOMY;  Surgeon: Daneil Dolin, MD;  [facility];  Service: Endoscopy;;  colon  . TONSILLECTOMY Bilateral     There were no vitals filed for this visit.      Subjective Assessment - 01/21/16 0940    Subjective Pt with rt sided anterior labial leakage upon entering and sitting down in ST room.   Currently in Pain? No/denies               ADULT SLP TREATMENT - 01/21/16 0954      General Information   Behavior/Cognition Alert;Cooperative;Pleasant mood     Treatment Provided   Treatment provided Cognitive-Linquistic     Cognitive-Linquistic Treatment   Treatment focused on Dysarthria   Skilled Treatment Pt entered room with WNL speech loudness in 5 minutes conversation and maintained abdominal breathing (AB). As conversation progressed to mod complex-complex conversation re: anterior labial leakage pt's volume decreased to sub-WNL and his AB decr'd. SLP again encouraged pt to use external cue for  maintaining loudness. Pt produced loud "Hey-/a/" to recalibrate loudness in conversation to WNL. Pt's average was 87dB with rapid decr in volume compared to previous sessions. With loud/a/ (no "Hey!" preceding), pt maintained higher average in upper 80s/los 90s dB. AFter loud /a/, pt was engaged in mod complex/complex conversation re: RVs in the gym and pt's speech decr'd average to upper 60s dB. SLP again stressed need to use external cue (golf ball, nut in pocket, or wearing a watch  or a rubber band on his wrist). Abdominal breathing was approx 35% in last conversation.     Assessment / Recommendations / Plan   Plan Continue with current plan of care     Progression Toward Goals   Progression toward goals Progressing toward goals          SLP Education - 01/21/16 1102    Education provided Yes   Education Details external cue for maintaining loudness througout the day   Person(s) Educated Patient   Methods Explanation   Comprehension Verbalized understanding          SLP Short Term Goals - 01/21/16 1544      SLP SHORT TERM GOAL #1   Title pt will phonate loud /a/ average 88dB for 3 sessions   Status Achieved     SLP SHORT TERM GOAL #2   Title pt will produce sentences with average 71dB with WNL voicing   Status Achieved     SLP SHORT TERM GOAL #3   Title pt will maintain loudness at average 70dB with WNL voicing in 5 minutes simple conversation   Status Achieved     SLP SHORT TERM GOAL #4   Title pt will demo abdominal breathing in 5 minutes simple conversation 75% of the time over three sessions   Baseline 01-13-16, 01-19-16   Time 1   Period Weeks   Status On-going          SLP Long Term Goals - 01/21/16 1544      SLP LONG TERM GOAL #1   Title pt will maintain loud /a/ with average 88dB in 6 therapy sessions   Baseline 01/03/16, 01-13-16, 01-21-16   Time 2   Period Weeks   Status On-going     SLP LONG TERM GOAL #2   Title pt will use functional voicing in 10 minutes of simple conversation and maintain average 70dB over three sessions   Baseline 01-13-16, 01-19-16, 01-21-16   Status Achieved     SLP LONG TERM GOAL #3   Title pt will demo abdominal breathing in 5 minutes mod complex conversation 50% of the time in three sessions   Baseline 01-13-16, 01-19-16   Time 3   Period Weeks   Status On-going     SLP LONG TERM GOAL #4   Title pt will maintain average 70dB loudness in 10 minutes mod complex/complex conversation    Time 2    Period Weeks   Status New          Plan - 01/21/16 1547    Clinical Impression Statement Pt continues to make progress in conversational speech - maintaining volume over longer time periods and also while adding distractors, however in mod complex and complex conversation pt has more difficulty wiht WNL volume and maintaining breath support. Continue skilled ST to maximize carryover of volume and intellgibiltiy. SLP again spoke today re: external cues for pt to maintain speech loudness over the day.   Speech Therapy Frequency 2x / week   Duration --  8 weeks - pt choosing to schedule once/week   Treatment/Interventions Compensatory strategies;Functional tasks;Cueing hierarchy;Patient/family education;Multimodal communcation approach;Language facilitation;SLP instruction and feedback;Internal/external aids   Potential to Achieve Goals Good      Patient will benefit from skilled therapeutic intervention in order to improve the following deficits and impairments:   Dysarthria and anarthria  Dysphagia, unspecified type   Speech Therapy Progress Note  Dates of Reporting Period: 11-23-15 to present  Objective Reports of Subjective Statement: Pt has been seen for 10 ST visits targeting appropriate loudness in conversation  Objective Measurements: Pt has seen incr in loud /a/ production to upper 80s dB. Additionally, simple conversation volume has improved to 70+dB  Goal Update: See above.  Plan: Continue to see pt x2/week for 1-4 more weeks.  Reason Skilled Services are Required: Pt has not shown WNL volume in mod complex/complex conversation. This still requires ST cues.   Problem List Patient Active Problem List   Diagnosis Date Noted  . History of colonic polyps   . Hx of adenomatous colonic polyps 01/04/2015  . Lumbar canal stenosis 12/04/2013  . Aortic stenosis 10/21/2013  . Hypoxemia 04/16/2012  . Primary central sleep apnea 04/16/2012  . Obstructive sleep apnea (adult)  (pediatric) 04/16/2012  . Orthostatic hypotension 04/16/2012  . Sleep disturbance, unspecified 04/16/2012  . Unspecified hereditary and idiopathic peripheral neuropathy 04/16/2012  . Parkinson's 04/16/2012  . COLONIC POLYPS, ADENOMATOUS 11/19/2008  . GERD 11/19/2008  . HEMATOCHEZIA 11/19/2008  . GI BLEEDING 11/19/2008  . HYPERLIPIDEMIA-MIXED 07/03/2008  . HYPERTENSION, UNSPECIFIED 07/03/2008    [facility] ,[place], CCC-SLP  01/21/2016, 3:49 PM  [facility] [address] [place], [place], [address] Phone: [phone]   Fax:  [phone]   Name: [name] MRN: [id number] Date of Birth: [birth year]

## 2016-01-21 NOTE — Therapy (Signed)
[facility] [address] [place], [place], [address] Phone: [phone]   Fax:  [phone]  Occupational Therapy Treatment  Patient Details  Name: [name] MRN: [id number] Date of Birth: 02/13/1931 Referring Provider: Dr. Star Age  Encounter Date: 01/21/2016      OT End of Session - 01/21/16 1130    Visit Number 10   Number of Visits 17   Date for OT Re-Evaluation 01/22/16   Authorization Type Medicare/Aetna (G-code needed)  **KX**   Authorization - Visit Number 10   Authorization - Number of Visits 10   OT Start Time 1017   OT Stop Time 1100   OT Time Calculation (min) 43 min   Activity Tolerance Patient tolerated treatment well   Behavior During Therapy [facility] for tasks assessed/performed      Past Medical History:  Diagnosis Date  . Arthritis   . Cancer [facility])    h/o  skin  cancer  . Ex-smoker   . GERD (gastroesophageal reflux disease)   . Hypercholesteremia   . Hypertension   . Lumbar stenosis   . Parkinson's disease ([place])   . Sleep apnea    ?? sleep apnea....tested 2013 @ [facility]     . Sleep disorder     Past Surgical History:  Procedure Laterality Date  . APPENDECTOMY    . BACK SURGERY  11/2013  . COLONOSCOPY  11/17/2008   Rourk: Sessile polyp in the cecum status post saline-assisted piecemeal polypectomy, resolution clipping and epinephrine injection therapy performed. Shallow sigmoid diverticula.. Tubular adenoma  . COLONOSCOPY  11/19/2008   Fields: Large amount of liquid blood with clots seen throughout the colon. Previous [place] resolution clip remained in place. Purple spot seen in the lateral aspect polypectomy site, 3 resolution clips placed here. 6 mm sessile ascending colon polyp seen and not manipulated.  . COLONOSCOPY  11/25/2008   Rourk: Blood-tinged colonic effluent, suspect recurrent post polypectomy bleeding status post placement of 3 clips on the polypectomy  site on top of the 3 clips that already existed, one had fallen off since last procedure. One small polyp distal to the cecum not manipulated  . COLONOSCOPY  12/06/2009   Rourk: Internal hemorrhoids, scattered left-sided diverticula, cecal polyps, one snared and subsequently clipped, 2 adjacent diminutive polyps ablated. tubular adenoma  . COLONOSCOPY N/A 01/21/2015   Dr. Gala Romney: 1.5 cm carpet-type polyp just distal to the ileocecal valve removed piecemeal fashion and ablated. Small polyp in the rectum. Pathology-tubular adenomas  . COLONOSCOPY N/A 09/01/2015   Procedure: COLONOSCOPY;  Surgeon: Daneil Dolin, MD;  [facility];  Service: Endoscopy;  Laterality: N/A;  845  . EYE SURGERY     bilateral  cataracts  . MENISCUS REPAIR     right knee  2000  . POLYPECTOMY  09/01/2015   Procedure: POLYPECTOMY;  Surgeon: Daneil Dolin, MD;  Location[facility];  Service: Endoscopy;;  colon  . TONSILLECTOMY Bilateral     There were no vitals filed for this visit.      Subjective Assessment - 01/21/16 1023    Pertinent History PD diagnosis approx 6 years ago; hx of low back surgery 11/2013; hx of bilateral RTC injuries; HTN, othostatic hypotension, sleep apnea, lumbar stenosis   Patient Stated Goals improving donning coat, improve fine motor skills   Currently in Pain? No/denies             Treatment: Therapist started checking progress towards goals. See goals  and g-code for update. Dynamic step and reach to copy small peg design( for cognitive component), min v.c. For design, mod v.c. For larger amplitude movements.  Pt practiced donning/ doffing jacket with big movements, pt demonstrates improved performance.          [facility]) - 01/21/16 1128    PWR! exercises Moves in standing   PWR! Up x10   PWR! Rock [employer]! Twist x10   PWR Step x20   Comments min v.c. and demonstration for larger amplitude movements               OT Short Term Goals - 12/31/15  1103      OT SHORT TERM GOAL #1   Title Pt will be independent with updated PD-specific HEP.--check STGs 12/22/15   Time 4   Period Weeks   Status Achieved     OT SHORT TERM GOAL #2   Title Pt will demo at least 120* L shoulder flex with -20* or greater elbow extension for improved overhead reaching.   Time 4   Period Weeks   Status Achieved     OT SHORT TERM GOAL #3   Title Pt will be able to button/unbutton 3 buttons on tabletop in 40 sec or less for incr ease with dressing.   Time 4   Period Weeks   Status On-going  63.41 secs     OT SHORT TERM GOAL #4   Title Pt will be able to write 3 sentences with 100% accuracy and no significant incr in size.   Time 4   Period Weeks   Status On-going  grossly 90-95% legibility           OT Long Term Goals - 01/21/16 1127      OT LONG TERM GOAL #1   Title Pt will verbalize understanding of updated strategies/AE to incr ease with ADLs prn.--check LTGs 01/22/16   Time 8   Period Weeks   Status Achieved     OT LONG TERM GOAL #2   Title Pt will improve bradykinesia, functional reaching, coordination for ADLs as shown by improving score on box and blocks test by at least 4 blocks bilaterally.   Baseline R-46 blocks, L-47 blocks   Status Not Met  RUE 49 blocks, LUE 46 blocks      OT LONG TERM GOAL #3   Title Pt will improve ability to don/doff jacket as shown by improving time on PPT#4 (with gown) in 21sec or less.   Baseline 24.84sec    Time 8   Period Weeks   Status On-going     OT LONG TERM GOAL #4   Title Pt will be able to button/unbutton 3 buttons on tabletop in 35sec or less for incr ease with dressing.   Baseline 43.44sec   Time 8   Period Weeks   Status On-going  60 secs     OT LONG TERM GOAL #5   Title Pt will improve bilateral standing functional reach by at least 3" for incr balance for IADLs.   Baseline R-5", L-4"   Time 8   Period Weeks   Status Achieved  R 8.5, LUE 9 in     OT LONG TERM GOAL #6    Title -------------------------     OT LONG TERM GOAL #7   Title ----------------------               Plan - 01/21/16 1138    Clinical Impression Statement Pt  is progressing towards goals. He demonstrates improved performance of big movements with ADLS. Pt continues to require encouragement to perform PWR! exercises at home.   Rehab Potential Good   OT Frequency 2x / week   OT Duration 8 weeks   OT Treatment/Interventions Self-care/ADL training;Moist Heat;Electrical Stimulation;Fluidtherapy;Contrast Bath;Passive range of motion;Therapeutic activities;Cognitive remediation/compensation;DME and/or AE instruction;Parrafin;Cryotherapy;Therapeutic exercises;Manual Therapy;Neuromuscular education;Splinting;Ultrasound;Energy conservation;Therapeutic exercise;Functional Mobility Training;Patient/family education;Balance training   Plan discuss possible d/c next visit   OT Home Exercise Plan Education issued:  seated PWR!, up, rock and step, supine PWR! basic 4; PWR! hands and coordination HEP, standing PWR!, modified quadraped basic 4   Consulted and Agree with Plan of Care Patient    Occupational Therapy Progress Note  Dates of Reporting Period: 11/22/15 to Feb 08, 2016  Objective Reports of Subjective Statement: see below  Objective Measurements: standing functional reach, box/ blocks see below  Goal Update: Pt is progressing towards goals however he has not fully met goals. Pt reports he has not been exercising at home as consistently as he should.  Plan:  Discuss plans for possible d/c next week  Reason Skilled Services are Required: Pt can benefit from skilled occupational therapy to address: bradykinesia, rigidity, decreased coordination , abnormal posture, decreased balance in order to maximize safety and independence with ADLs/ IADLs.  Patient will benefit from skilled therapeutic intervention in order to improve the following deficits and impairments:  Decreased mobility, Impaired UE  functional use, Decreased knowledge of use of DME, Decreased balance, Decreased coordination, Decreased range of motion, Impaired tone, Decreased cognition, Improper spinal/pelvic alignment, Decreased strength, Impaired flexibility, Decreased activity tolerance  Visit Diagnosis: Other symptoms and signs involving the nervous system  Other symptoms and signs involving the musculoskeletal system  Abnormal posture  Other lack of coordination  Stiffness of left elbow, not elsewhere classified  Stiffness of left shoulder, not elsewhere classified  Stiffness of right elbow, not elsewhere classified      G-Codes - 02/08/16 1032    Functional Assessment Tool Used Standing functional reach: RUE 8.5, LUE 9 ; fastening/unfastening 3 buttons in 60 sec, box and blocks test:  R-49, L-46   Functional Limitation Carrying, moving and handling objects   Carrying, Moving and Handling Objects Current Status (Z1245) At least 20 percent but less than 40 percent impaired, limited or restricted   Carrying, Moving and Handling Objects Goal Status (Y0998) At least 20 percent but less than 40 percent impaired, limited or restricted      Problem List Patient Active Problem List   Diagnosis Date Noted  . History of colonic polyps   . Hx of adenomatous colonic polyps 01/04/2015  . Lumbar canal stenosis 12/04/2013  . Aortic stenosis 10/21/2013  . Hypoxemia 04/16/2012  . Primary central sleep apnea 04/16/2012  . Obstructive sleep apnea (adult) (pediatric) 04/16/2012  . Orthostatic hypotension 04/16/2012  . Sleep disturbance, unspecified 04/16/2012  . Unspecified hereditary and idiopathic peripheral neuropathy 04/16/2012  . Parkinson's 04/16/2012  . COLONIC POLYPS, ADENOMATOUS 11/19/2008  . GERD 11/19/2008  . HEMATOCHEZIA 11/19/2008  . GI BLEEDING 11/19/2008  . HYPERLIPIDEMIA-MIXED 07/03/2008  . HYPERTENSION, UNSPECIFIED 07/03/2008    RINE,KATHRYN 02-08-16, 11:40 AM Theone Murdoch, OTR/L [phone]  [phone] Phone: [phone] 11:40 AM 08-Feb-2016 [facility] [address] [place], [place], [address] Phone: [phone]   Fax:  [phone]  Name: [name] MRN: [id number] Date of Birth: 08-22-1931

## 2016-01-26 ENCOUNTER — Ambulatory Visit: Payer: Medicare Other

## 2016-01-26 ENCOUNTER — Ambulatory Visit: Payer: Medicare Other | Admitting: Occupational Therapy

## 2016-01-26 DIAGNOSIS — M25622 Stiffness of left elbow, not elsewhere classified: Secondary | ICD-10-CM

## 2016-01-26 DIAGNOSIS — R293 Abnormal posture: Secondary | ICD-10-CM

## 2016-01-26 DIAGNOSIS — R29898 Other symptoms and signs involving the musculoskeletal system: Secondary | ICD-10-CM | POA: Diagnosis not present

## 2016-01-26 DIAGNOSIS — M25612 Stiffness of left shoulder, not elsewhere classified: Secondary | ICD-10-CM | POA: Diagnosis not present

## 2016-01-26 DIAGNOSIS — R29818 Other symptoms and signs involving the nervous system: Secondary | ICD-10-CM

## 2016-01-26 DIAGNOSIS — R471 Dysarthria and anarthria: Secondary | ICD-10-CM

## 2016-01-26 DIAGNOSIS — R278 Other lack of coordination: Secondary | ICD-10-CM | POA: Diagnosis not present

## 2016-01-26 DIAGNOSIS — R1310 Dysphagia, unspecified: Secondary | ICD-10-CM

## 2016-01-26 NOTE — Therapy (Signed)
[facility] [address] [place], [place], [address] Phone: [phone]   Fax:  [phone]  Occupational Therapy Treatment  Patient Details  Name: [name] MRN: [id number] Date of Birth: [birth year] Referring Provider: Dr. Star Age  Encounter Date: 01/26/2016      OT End of Session - 01/26/16 1337    Visit Number 11   Number of Visits 17   Date for OT Re-Evaluation 01/25/18   Authorization Type Medicare/Aetna (G-code needed)     Authorization Time Period cert. 11/23/15-01/22/16   Authorization - Visit Number 11   Authorization - Number of Visits 20   OT Start Time 0935  2 units, d/c visit   OT Stop Time 1005   OT Time Calculation (min) 30 min   Activity Tolerance Patient tolerated treatment well   Behavior During Therapy WFL for tasks assessed/performed      Past Medical History:  Diagnosis Date  . Arthritis   . Cancer [facility])    h/o  skin  cancer  . Ex-smoker   . GERD (gastroesophageal reflux disease)   . Hypercholesteremia   . Hypertension   . Lumbar stenosis   . Parkinson's disease ([place])   . Sleep apnea    ?? sleep apnea....tested 2013 @ [facility]     . Sleep disorder     Past Surgical History:  Procedure Laterality Date  . APPENDECTOMY    . BACK SURGERY  11/2013  . COLONOSCOPY  11/17/2008   Rourk: Sessile polyp in the cecum status post saline-assisted piecemeal polypectomy, resolution clipping and epinephrine injection therapy performed. Shallow sigmoid diverticula.. Tubular adenoma  . COLONOSCOPY  11/19/2008   Fields: Large amount of liquid blood with clots seen throughout the colon. Previous [place] resolution clip remained in place. Purple spot seen in the lateral aspect polypectomy site, 3 resolution clips placed here. 6 mm sessile ascending colon polyp seen and not manipulated.  . COLONOSCOPY  11/25/2008   Rourk: Blood-tinged colonic effluent, suspect recurrent post  polypectomy bleeding status post placement of 3 clips on the polypectomy site on top of the 3 clips that already existed, one had fallen off since last procedure. One small polyp distal to the cecum not manipulated  . COLONOSCOPY  12/06/2009   Rourk: Internal hemorrhoids, scattered left-sided diverticula, cecal polyps, one snared and subsequently clipped, 2 adjacent diminutive polyps ablated. tubular adenoma  . COLONOSCOPY N/A 01/21/2015   Dr. Gala Romney: 1.5 cm carpet-type polyp just distal to the ileocecal valve removed piecemeal fashion and ablated. Small polyp in the rectum. Pathology-tubular adenomas  . COLONOSCOPY N/A 09/01/2015   Procedure: COLONOSCOPY;  Surgeon: Daneil Dolin, MD;  [facility];  Service: Endoscopy;  Laterality: N/A;  845  . EYE SURGERY     bilateral  cataracts  . MENISCUS REPAIR     right knee  2000  . POLYPECTOMY  09/01/2015   Procedure: POLYPECTOMY;  Surgeon: Daneil Dolin, MD;  [facility];  Service: Endoscopy;;  colon  . TONSILLECTOMY Bilateral     There were no vitals filed for this visit.      Subjective Assessment - 01/26/16 0939    Subjective  Denies pain, agrees with plans for d/c   Pertinent History PD diagnosis approx 6 years ago; hx of low back surgery 11/2013; hx of bilateral RTC injuries; HTN, othostatic hypotension, sleep apnea, lumbar stenosis   Patient Stated Goals improving donning coat, improve fine motor skills   Currently in  Pain? No/denies           Arm bike x 6 mins level 1 for conditioning as warmup, pt maintained 40 rpm   Therapist checked progress towards goals and discussed progress with pt. Therapist reinforced importance of continued exercise following d/c. Pt does not want to schedule a f/u screen at this time. If he needs therapy in the future he will need to be referred by MD. Pt verbalized understanding..                    OT Short Term Goals - 01/26/16 6073      OT SHORT TERM GOAL #1    Title Pt will be independent with updated PD-specific HEP.--check STGs 12/22/15   Time 4   Period Weeks   Status Achieved     OT SHORT TERM GOAL #2   Title Pt will demo at least 120* L shoulder flex with -20* or greater elbow extension for improved overhead reaching.   Time 4   Period Weeks   Status Achieved     OT SHORT TERM GOAL #3   Title Pt will be able to button/unbutton 3 buttons on tabletop in 40 sec or less for incr ease with dressing.   Time 4   Period Weeks   Status Not Met  51.34 secs     OT SHORT TERM GOAL #4   Title Pt will be able to write 3 sentences with 100% accuracy and no significant incr in size.   Time 4   Period Weeks   Status Achieved           OT Long Term Goals - 01/26/16 7106      OT LONG TERM GOAL #1   Title Pt will verbalize understanding of updated strategies/AE to incr ease with ADLs prn.--check LTGs 01/22/16   Time 8   Period Weeks   Status Achieved     OT LONG TERM GOAL #2   Title Pt will improve bradykinesia, functional reaching, coordination for ADLs as shown by improving score on box and blocks test by at least 4 blocks bilaterally.   Baseline R-46 blocks, L-47 blocks   Status Achieved  RUE 55 blocks, LUE 51 blocks      OT LONG TERM GOAL #3   Title Pt will improve ability to don/doff jacket as shown by improving time on PPT#4 (with gown) in 21sec or less.   Baseline 24.84sec    Time 8   Period Weeks   Status Partially Met  24.37 secs, 18.93 secs with pt using his own jacket     OT LONG TERM GOAL #4   Title Pt will be able to button/unbutton 3 buttons on tabletop in 35sec or less for incr ease with dressing.   Baseline 43.44sec   Time 8   Period Weeks   Status Not Met  60 secs     OT LONG TERM GOAL #5   Title Pt will improve bilateral standing functional reach by at least 3" for incr balance for IADLs.   Baseline R-5", L-4"   Time 8   Period Weeks   Status Achieved  R 8.5, LUE 9 in     OT LONG TERM GOAL #6   Title  -------------------------     OT LONG TERM GOAL #7   Title ----------------------         Addendum: G Codes Functional Assessment Tool: Carrying moving, handling objects Goal status CJ D/C status CJ  Plan - 01/26/16 1336    Clinical Impression Statement Pt demonstrates overall progress. He agrees with plans for discharge.   Rehab Potential Good   OT Frequency 2x / week   OT Duration 8 weeks   OT Treatment/Interventions Self-care/ADL training;Moist Heat;Electrical Stimulation;Fluidtherapy;Contrast Bath;Passive range of motion;Therapeutic activities;Cognitive remediation/compensation;DME and/or AE instruction;Parrafin;Cryotherapy;Therapeutic exercises;Manual Therapy;Neuromuscular education;Splinting;Ultrasound;Energy conservation;Therapeutic exercise;Functional Mobility Training;Patient/family education;Balance training   Plan d/c OT   OT Home Exercise Plan Education issued:  seated PWR!, up, rock and step, supine PWR! basic 4; PWR! hands and coordination HEP, standing PWR!, modified quadraped basic 4   Consulted and Agree with Plan of Care Patient    OCCUPATIONAL THERAPY DISCHARGE SUMMARY   Current functional level related to goals / functional outcomes: See above   Remaining deficits: Bradykinesia, decreased coordination, decreased balance, rigidity, abnormal posture   Education / Equipment: Pt was instructed in PD specific HEP, and adpated strategies for ADLs. Pt verbalizes understanding.  Plan: Patient agrees to discharge.  Patient goals were partially met. Patient is being discharged due to being pleased with the current functional level.  ?????       Patient will benefit from skilled therapeutic intervention in order to improve the following deficits and impairments:  Decreased mobility, Impaired UE functional use, Decreased knowledge of use of DME, Decreased balance, Decreased coordination, Decreased range of motion, Impaired tone, Decreased cognition,  Improper spinal/pelvic alignment, Decreased strength, Impaired flexibility, Decreased activity tolerance  Visit Diagnosis: Other symptoms and signs involving the musculoskeletal system  Other symptoms and signs involving the nervous system  Abnormal posture  Other lack of coordination  Stiffness of left elbow, not elsewhere classified  Stiffness of left shoulder, not elsewhere classified    Problem List Patient Active Problem List   Diagnosis Date Noted  . History of colonic polyps   . Hx of adenomatous colonic polyps 01/04/2015  . Lumbar canal stenosis 12/04/2013  . Aortic stenosis 10/21/2013  . Hypoxemia 04/16/2012  . Primary central sleep apnea 04/16/2012  . Obstructive sleep apnea (adult) (pediatric) 04/16/2012  . Orthostatic hypotension 04/16/2012  . Sleep disturbance, unspecified 04/16/2012  . Unspecified hereditary and idiopathic peripheral neuropathy 04/16/2012  . Parkinson's 04/16/2012  . COLONIC POLYPS, ADENOMATOUS 11/19/2008  . GERD 11/19/2008  . HEMATOCHEZIA 11/19/2008  . GI BLEEDING 11/19/2008  . HYPERLIPIDEMIA-MIXED 07/03/2008  . HYPERTENSION, UNSPECIFIED 07/03/2008    Symir Mah 01/26/2016, 1:40 PM  [facility] [address] [place], [place], [address] Phone: [phone]   Fax:  [phone]  Name: [name] MRN: [id number] Date of Birth: 03-24-1931

## 2016-01-27 NOTE — Therapy (Signed)
[facility] [address] [place], [place], [address] Phone: [phone]   Fax:  [phone]  Speech Language Pathology Treatment  Patient Details  Name: [name] MRN: [id number] Date of Birth: 06/02/1931 Referring Provider: Star Age, M.D.  Encounter Date: 01/26/2016      End of Session - 01/26/16 1104    Visit Number 11   Number of Visits 17   Date for SLP Re-Evaluation 02/04/16   SLP Start Time 1018   SLP Stop Time  1052  d/c day   SLP Time Calculation (min) 34 min   Activity Tolerance Patient tolerated treatment well      Past Medical History:  Diagnosis Date  . Arthritis   . Cancer [facility])    h/o  skin  cancer  . Ex-smoker   . GERD (gastroesophageal reflux disease)   . Hypercholesteremia   . Hypertension   . Lumbar stenosis   . Parkinson's disease ([place])   . Sleep apnea    ?? sleep apnea....tested 2013 @ [facility]     . Sleep disorder     Past Surgical History:  Procedure Laterality Date  . APPENDECTOMY    . BACK SURGERY  11/2013  . COLONOSCOPY  11/17/2008   Rourk: Sessile polyp in the cecum status post saline-assisted piecemeal polypectomy, resolution clipping and epinephrine injection therapy performed. Shallow sigmoid diverticula.. Tubular adenoma  . COLONOSCOPY  11/19/2008   Fields: Large amount of liquid blood with clots seen throughout the colon. Previous [place] resolution clip remained in place. Purple spot seen in the lateral aspect polypectomy site, 3 resolution clips placed here. 6 mm sessile ascending colon polyp seen and not manipulated.  . COLONOSCOPY  11/25/2008   Rourk: Blood-tinged colonic effluent, suspect recurrent post polypectomy bleeding status post placement of 3 clips on the polypectomy site on top of the 3 clips that already existed, one had fallen off since last procedure. One small polyp distal to the cecum not manipulated  . COLONOSCOPY  12/06/2009   Rourk: Internal hemorrhoids, scattered left-sided diverticula, cecal polyps, one snared and subsequently clipped, 2 adjacent diminutive polyps ablated. tubular adenoma  . COLONOSCOPY N/A 01/21/2015   Dr. Gala Romney: 1.5 cm carpet-type polyp just distal to the ileocecal valve removed piecemeal fashion and ablated. Small polyp in the rectum. Pathology-tubular adenomas  . COLONOSCOPY N/A 09/01/2015   Procedure: COLONOSCOPY;  Surgeon: Daneil Dolin, MD;  [facility];  Service: Endoscopy;  Laterality: N/A;  845  . EYE SURGERY     bilateral  cataracts  . MENISCUS REPAIR     right knee  2000  . POLYPECTOMY  09/01/2015   Procedure: POLYPECTOMY;  Surgeon: Daneil Dolin, MD;  [facility];  Service: Endoscopy;;  colon  . TONSILLECTOMY Bilateral     There were no vitals filed for this visit.      Subjective Assessment - 01/26/16 1019    Subjective "I think we should treat this as our last visit. (OT) treated this as her last visit too. I think we've gone through the important points."   Currently in Pain? No/denies               ADULT SLP TREATMENT - 01/26/16 1021      General Information   Behavior/Cognition Alert;Cooperative;Pleasant mood     Treatment Provided   Treatment provided Cognitive-Linquistic     Cognitive-Linquistic Treatment   Treatment focused on Dysarthria   Skilled Treatment Pt entered room  with sub-WNL speech. With explanation of therapy events in OT, initial words of each utterance noted as WNL loudness then volume faded, consistently. When SLP made pt aware of this, pt's volume was more consistently near WNL. Pt with minimal follow up with thinking about how to alleviate anterior leakage, but he was able to tell SLP two of the many suggestions SLP made about how to decr what pt believed to be excess saliva. In structured speech tasks of min-mod complexity linguisticially, pt's average loudness was near 70dB. Sidebar conversation between tasks/reps in  a task was measured at mid to upper 60s dB average. SLP suggested again that pt use an external cue for loudness throughout the day - pt stated, "My wife does that."     Assessment / Recommendations / Plan   Plan Discharge SLP treatment due to (comment)  pt satisfied with current functional level     Progression Toward Goals   Progression toward goals --  discharge today - see goal summary          SLP Education - 01/26/16 1103    Education provided Yes   Education Details external cue for loudness throughout the day, loud /a/ continue after ST d/c   Person(s) Educated Patient   Methods Explanation   Comprehension Verbalized understanding          SLP Short Term Goals - 01/26/16 1108      SLP SHORT TERM GOAL #1   Title pt will phonate loud /a/ average 88dB for 3 sessions   Status Achieved     SLP SHORT TERM GOAL #2   Title pt will produce sentences with average 71dB with WNL voicing   Status Achieved     SLP SHORT TERM GOAL #3   Title pt will maintain loudness at average 70dB with WNL voicing in 5 minutes simple conversation   Status Achieved     SLP [place] #4   Title pt will demo abdominal breathing in 5 minutes simple conversation 75% of the time over three sessions   Baseline 01-13-16, 01-19-16   Status Partially Met          SLP Long Term Goals - 01/26/16 1108      SLP LONG TERM GOAL #1   Title pt will maintain loud /a/ with average 88dB in 6 therapy sessions   Baseline 01/03/16, 01-13-16, 01-21-16   Status Partially Met     SLP LONG TERM GOAL #2   Title pt will use functional voicing in 10 minutes of simple conversation and maintain average 70dB over three sessions   Baseline 01-13-16, 01-19-16, 01-21-16   Status Achieved     SLP LONG TERM GOAL #3   Title pt will demo abdominal breathing in 5 minutes mod complex conversation 50% of the time in three sessions   Baseline 01-13-16, 01-19-16   Status Partially Met     SLP LONG TERM GOAL #4   Title pt  will maintain average 70dB loudness in 10 minutes mod complex/complex conversation    Status Not Met          Plan - 01/26/16 1104    Clinical Impression Statement Pt was making progress with short conversation and beginning to master louder speech in min-mod complex conversation and desired to stop therapy, communicating that the basics have been worked upon and he is comfortable with his current functional level.    Treatment/Interventions Compensatory strategies;Functional tasks;Cueing hierarchy;Patient/family education;Multimodal communcation approach;Language facilitation;SLP instruction and feedback;Internal/external aids  Potential to Achieve Goals Good      Patient will benefit from skilled therapeutic intervention in order to improve the following deficits and impairments:   Dysarthria and anarthria  Dysphagia, unspecified type      G-Codes - 02-19-16 1739    Functional Assessment Tool Used noms- 5   Functional Limitations Motor speech   Motor Speech Goal Status [phone]) At least 20 percent but less than 40 percent impaired, limited or restricted   Motor Speech Goal Status (J1791) At least 40 percent but less than 60 percent impaired, limited or restricted     [place]  Visits from Start of Care: 11  Current functional level related to goals / functional outcomes: See "skilled intervention" and "clinical impression statement" above.   Remaining deficits: See goal update above in "SLP Short Term Goals" and "SLP Long Term Goals"   Education / Equipment: Loud /a/, need for consistency with loud /a/ daily, compensations for excess saliva, compensations for decr'd conversational volume.   Plan: Patient agrees to discharge.  Patient goals were partially met. Patient is being discharged due to being pleased with the current functional level.  ?????       Problem List Patient Active Problem List   Diagnosis Date Noted  . History of colonic  polyps   . Hx of adenomatous colonic polyps 01/04/2015  . Lumbar canal stenosis 12/04/2013  . Aortic stenosis 10/21/2013  . Hypoxemia 04/16/2012  . Primary central sleep apnea 04/16/2012  . Obstructive sleep apnea (adult) (pediatric) 04/16/2012  . Orthostatic hypotension 04/16/2012  . Sleep disturbance, unspecified 04/16/2012  . Unspecified hereditary and idiopathic peripheral neuropathy 04/16/2012  . Parkinson's 04/16/2012  . COLONIC POLYPS, ADENOMATOUS 11/19/2008  . GERD 11/19/2008  . HEMATOCHEZIA 11/19/2008  . GI BLEEDING 11/19/2008  . HYPERLIPIDEMIA-MIXED 07/03/2008  . HYPERTENSION, UNSPECIFIED 07/03/2008    [facility] ,MS, CCC-SLP  01/27/2016, 1:42 PM  [facility] [address] [place], [place], [address] Phone: [phone]   Fax:  [phone]   Name: [name] MRN: [id number] Date of Birth: 1931-04-12

## 2016-01-28 ENCOUNTER — Encounter: Payer: Medicare Other | Admitting: Occupational Therapy

## 2016-02-01 ENCOUNTER — Encounter: Payer: Medicare Other | Admitting: Occupational Therapy

## 2016-02-01 DIAGNOSIS — L570 Actinic keratosis: Secondary | ICD-10-CM | POA: Diagnosis not present

## 2016-02-01 DIAGNOSIS — D044 Carcinoma in situ of skin of scalp and neck: Secondary | ICD-10-CM | POA: Diagnosis not present

## 2016-02-01 DIAGNOSIS — D485 Neoplasm of uncertain behavior of skin: Secondary | ICD-10-CM | POA: Diagnosis not present

## 2016-02-01 DIAGNOSIS — Z85828 Personal history of other malignant neoplasm of skin: Secondary | ICD-10-CM | POA: Diagnosis not present

## 2016-02-01 DIAGNOSIS — C44319 Basal cell carcinoma of skin of other parts of face: Secondary | ICD-10-CM | POA: Diagnosis not present

## 2016-02-02 ENCOUNTER — Ambulatory Visit: Payer: Medicare Other | Admitting: Cardiovascular Disease

## 2016-02-03 ENCOUNTER — Encounter: Payer: Medicare Other | Admitting: Occupational Therapy

## 2016-02-09 ENCOUNTER — Encounter: Payer: Self-pay | Admitting: Cardiovascular Disease

## 2016-02-09 ENCOUNTER — Ambulatory Visit (INDEPENDENT_AMBULATORY_CARE_PROVIDER_SITE_OTHER): Payer: Medicare Other | Admitting: Cardiovascular Disease

## 2016-02-09 VITALS — BP 140/82 | HR 87 | Ht 71.0 in | Wt 197.0 lb

## 2016-02-09 DIAGNOSIS — I350 Nonrheumatic aortic (valve) stenosis: Secondary | ICD-10-CM | POA: Diagnosis not present

## 2016-02-09 DIAGNOSIS — Z136 Encounter for screening for cardiovascular disorders: Secondary | ICD-10-CM

## 2016-02-09 DIAGNOSIS — I10 Essential (primary) hypertension: Secondary | ICD-10-CM | POA: Diagnosis not present

## 2016-02-09 NOTE — Progress Notes (Signed)
SUBJECTIVE: The patient returns for follow-up of aortic stenosis and abdominal aortic aneurysm.   An ultrasound on 12/08/14 showed a mid abdominal aortic aneurysm measuring 3.7 cm.  Echocardiogram 07/05/15: Normal left ventricular systolic function and regional wall motion, LVEF 60-65%, mild LVH, grade 1 diastolic dysfunction, moderate aortic stenosis, mean gradient 26 mmHg, mild aortic root dilatation 41 mm, mild mitral regurgitation.  He denies leg swelling, chest pain, dizziness, and shortness of breath. Has not had any more blood pressure fluctuations.  ECG performed today shows sinus rhythm with late R-wave transition and nonspecific ST segment abnormalities.   Review of Systems: As per "subjective", otherwise negative.  No Known Allergies  Current Outpatient Prescriptions  Medication Sig Dispense Refill  . amLODipine (NORVASC) 10 MG tablet Take 5 mg by mouth daily.     . calcium gluconate 500 MG tablet Take 500 mg by mouth daily.    . carbidopa-levodopa (SINEMET IR) 25-100 MG tablet Take 1 pill 6 times a day: 6, 9, 12PM, 3PM, 6PM, and 9 PM 540 tablet 3  . glucosamine-chondroitin 500-400 MG tablet Take 1 tablet by mouth once.    . Multiple Vitamin (MULTIVITAMIN) tablet Take 1 tablet by mouth daily.    Marland Kitchen omeprazole (PRILOSEC OTC) 20 MG tablet Take 20 mg by mouth daily.    . pramipexole (MIRAPEX) 0.5 MG tablet Take 1 tablet (0.5 mg total) by mouth 3 (three) times daily. 270 tablet 3  . telmisartan-hydrochlorothiazide (MICARDIS HCT) 80-25 MG per tablet Take 1 tablet by mouth daily.    . valsartan-hydrochlorothiazide (DIOVAN-HCT) 80-12.5 MG tablet     . vitamin C (ASCORBIC ACID) 500 MG tablet Take 500 mg by mouth daily.     No current facility-administered medications for this visit.     Past Medical History:  Diagnosis Date  . Arthritis   . Cancer [facility])    h/o  skin  cancer  . Ex-smoker   . GERD (gastroesophageal reflux disease)   . Hypercholesteremia   .  Hypertension   . Lumbar stenosis   . Parkinson's disease ([place])   . Sleep apnea    ?? sleep apnea....tested 2013 @ [facility]     . Sleep disorder     Past Surgical History:  Procedure Laterality Date  . APPENDECTOMY    . BACK SURGERY  11/2013  . COLONOSCOPY  11/17/2008   Rourk: Sessile polyp in the cecum status post saline-assisted piecemeal polypectomy, resolution clipping and epinephrine injection therapy performed. Shallow sigmoid diverticula.. Tubular adenoma  . COLONOSCOPY  11/19/2008   Fields: Large amount of liquid blood with clots seen throughout the colon. Previous [place] resolution clip remained in place. Purple spot seen in the lateral aspect polypectomy site, 3 resolution clips placed here. 6 mm sessile ascending colon polyp seen and not manipulated.  . COLONOSCOPY  11/25/2008   Rourk: Blood-tinged colonic effluent, suspect recurrent post polypectomy bleeding status post placement of 3 clips on the polypectomy site on top of the 3 clips that already existed, one had fallen off since last procedure. One small polyp distal to the cecum not manipulated  . COLONOSCOPY  12/06/2009   Rourk: Internal hemorrhoids, scattered left-sided diverticula, cecal polyps, one snared and subsequently clipped, 2 adjacent diminutive polyps ablated. tubular adenoma  . COLONOSCOPY N/A 01/21/2015   Dr. Gala Romney: 1.5 cm carpet-type polyp just distal to the ileocecal valve removed piecemeal fashion and ablated. Small polyp in the rectum. Pathology-tubular adenomas  . COLONOSCOPY N/A 09/01/2015   Procedure:  COLONOSCOPY;  Surgeon: Daneil Dolin, MD;  [facility];  Service: Endoscopy;  Laterality: N/A;  845  . EYE SURGERY     bilateral  cataracts  . MENISCUS REPAIR     right knee  2000  . POLYPECTOMY  09/01/2015   Procedure: POLYPECTOMY;  Surgeon: Daneil Dolin, MD;  [facility];  Service: Endoscopy;;  colon  . TONSILLECTOMY Bilateral     Social History   Social History   . Marital status: Married    Spouse name: Clinical research associate  . Number of children: 4  . Years of education: 33   Occupational History  . Retired     Social History Main Topics  . Smoking status: Former Smoker    Packs/day: 1.00    Types: Cigarettes, Pipe    Start date: 01/22/1951    Quit date: 01/21/1989  . Smokeless tobacco: Never Used  . Alcohol use No  . Drug use: No  . Sexual activity: Not on file   Other Topics Concern  . Not on file   Social History Narrative  . No narrative on file     Vitals:   02/09/16 0854  BP: 140/82  Pulse: 87  SpO2: 91%  Weight: 197 lb (89.4 kg)  Height: 5\' 11"  (1.803 m)    PHYSICAL EXAM General: NAD Neck: No JVD, no thyromegaly or thyroid nodule.  Lungs: Dry crackles at bases b/l. CV: Nondisplaced PMI. Regular rate and rhythm, normal S1/S2, no S3/S4, III/VI crescendo-decrescendo systolic murmur heard throughout the precordium. No peripheral edema. No carotid bruit. Mild venous varicosities b/l. Abdomen: Soft, nontender,  no distention.  Skin: Intact without lesions or rashes.  Neurologic: Alert and oriented x 3. Tremor noted in hands. Psych: Normal affect. Extremities: No clubbing or cyanosis.  HEENT: Normal.     ECG: Most recent ECG reviewed.      ASSESSMENT AND PLAN: 1. Aortic stenosis: Moderate with mean gradient 26 mmHg on 07/05/15. Asymptomatic. Will monitor clinically and obtain surveillance echocardiograms as needed.  2. Essential HTN: Mildly elevated today on amlodipine 10 mg and Micardis-HCTZ 80-25 mg. Will monitor.  3. Hyperlipidemia: Not on statin therapy.  4. Sleep apnea: On BiPAP.  5. Orthostatic hypotension: No further episodes. He likely has a degree of autonomic dysfunction given his Parkinson's Disease.  6. Parkinson's disease: On Sinemet.  7. AAA: 3.7 cm x 3 cm. Repeat US in 1 year.  Dispo: f/u 1 year.   Kate Sable, M.D., F.A.C.C.

## 2016-02-09 NOTE — Patient Instructions (Signed)
Medication Instructions:  Your physician recommends that you continue on your current medications as directed. Please refer to the Current Medication list given to you today.   Labwork: NONE  Testing/Procedures: Your physician has requested that you have an abdominal aorta duplex. During this test, an ultrasound is used to evaluate the aorta. Allow 30 minutes for this exam. Do not eat after midnight the day before and avoid carbonated beverages ( just before 1 YEAR FOLLOW-UP)    Follow-Up: Your physician wants you to follow-up in: 1 YEAR .  You will receive a reminder letter in the mail two months in advance. If you don't receive a letter, please call our office to schedule the follow-up appointment.   Any Other Special Instructions Will Be Listed Below (If Applicable).     If you need a refill on your cardiac medications before your next appointment, please call your pharmacy.

## 2016-03-23 DIAGNOSIS — R7301 Impaired fasting glucose: Secondary | ICD-10-CM | POA: Diagnosis not present

## 2016-03-23 DIAGNOSIS — E782 Mixed hyperlipidemia: Secondary | ICD-10-CM | POA: Diagnosis not present

## 2016-03-28 DIAGNOSIS — R350 Frequency of micturition: Secondary | ICD-10-CM | POA: Diagnosis not present

## 2016-03-28 DIAGNOSIS — I714 Abdominal aortic aneurysm, without rupture: Secondary | ICD-10-CM | POA: Diagnosis not present

## 2016-03-28 DIAGNOSIS — E782 Mixed hyperlipidemia: Secondary | ICD-10-CM | POA: Diagnosis not present

## 2016-03-28 DIAGNOSIS — R7301 Impaired fasting glucose: Secondary | ICD-10-CM | POA: Diagnosis not present

## 2016-03-28 DIAGNOSIS — Z6827 Body mass index (BMI) 27.0-27.9, adult: Secondary | ICD-10-CM | POA: Diagnosis not present

## 2016-03-28 DIAGNOSIS — I10 Essential (primary) hypertension: Secondary | ICD-10-CM | POA: Diagnosis not present

## 2016-03-28 DIAGNOSIS — G20 Parkinson's disease: Secondary | ICD-10-CM | POA: Diagnosis not present

## 2016-03-28 DIAGNOSIS — R944 Abnormal results of kidney function studies: Secondary | ICD-10-CM | POA: Diagnosis not present

## 2016-05-02 DIAGNOSIS — H401131 Primary open-angle glaucoma, bilateral, mild stage: Secondary | ICD-10-CM | POA: Diagnosis not present

## 2016-05-05 DIAGNOSIS — L570 Actinic keratosis: Secondary | ICD-10-CM | POA: Diagnosis not present

## 2016-05-05 DIAGNOSIS — Z85828 Personal history of other malignant neoplasm of skin: Secondary | ICD-10-CM | POA: Diagnosis not present

## 2016-05-05 DIAGNOSIS — C44311 Basal cell carcinoma of skin of nose: Secondary | ICD-10-CM | POA: Diagnosis not present

## 2016-05-05 DIAGNOSIS — L821 Other seborrheic keratosis: Secondary | ICD-10-CM | POA: Diagnosis not present

## 2016-05-05 DIAGNOSIS — D485 Neoplasm of uncertain behavior of skin: Secondary | ICD-10-CM | POA: Diagnosis not present

## 2016-05-05 DIAGNOSIS — D0439 Carcinoma in situ of skin of other parts of face: Secondary | ICD-10-CM | POA: Diagnosis not present

## 2016-05-15 DIAGNOSIS — C44311 Basal cell carcinoma of skin of nose: Secondary | ICD-10-CM | POA: Diagnosis not present

## 2016-05-15 DIAGNOSIS — Z85828 Personal history of other malignant neoplasm of skin: Secondary | ICD-10-CM | POA: Diagnosis not present

## 2016-05-25 ENCOUNTER — Ambulatory Visit: Payer: Medicare Other

## 2016-05-25 ENCOUNTER — Ambulatory Visit: Payer: Medicare Other | Admitting: Physical Therapy

## 2016-05-25 ENCOUNTER — Ambulatory Visit: Payer: Medicare Other | Admitting: Occupational Therapy

## 2016-06-27 ENCOUNTER — Ambulatory Visit: Payer: Medicare Other | Admitting: Neurology

## 2016-07-25 DIAGNOSIS — I10 Essential (primary) hypertension: Secondary | ICD-10-CM | POA: Diagnosis not present

## 2016-07-25 DIAGNOSIS — R7301 Impaired fasting glucose: Secondary | ICD-10-CM | POA: Diagnosis not present

## 2016-07-31 DIAGNOSIS — E782 Mixed hyperlipidemia: Secondary | ICD-10-CM | POA: Diagnosis not present

## 2016-07-31 DIAGNOSIS — Z6828 Body mass index (BMI) 28.0-28.9, adult: Secondary | ICD-10-CM | POA: Diagnosis not present

## 2016-07-31 DIAGNOSIS — R944 Abnormal results of kidney function studies: Secondary | ICD-10-CM | POA: Diagnosis not present

## 2016-07-31 DIAGNOSIS — I714 Abdominal aortic aneurysm, without rupture: Secondary | ICD-10-CM | POA: Diagnosis not present

## 2016-07-31 DIAGNOSIS — D696 Thrombocytopenia, unspecified: Secondary | ICD-10-CM | POA: Diagnosis not present

## 2016-07-31 DIAGNOSIS — I10 Essential (primary) hypertension: Secondary | ICD-10-CM | POA: Diagnosis not present

## 2016-07-31 DIAGNOSIS — R7301 Impaired fasting glucose: Secondary | ICD-10-CM | POA: Diagnosis not present

## 2016-07-31 DIAGNOSIS — G20 Parkinson's disease: Secondary | ICD-10-CM | POA: Diagnosis not present

## 2016-07-31 DIAGNOSIS — K219 Gastro-esophageal reflux disease without esophagitis: Secondary | ICD-10-CM | POA: Diagnosis not present

## 2016-08-01 DIAGNOSIS — L218 Other seborrheic dermatitis: Secondary | ICD-10-CM | POA: Diagnosis not present

## 2016-08-01 DIAGNOSIS — L812 Freckles: Secondary | ICD-10-CM | POA: Diagnosis not present

## 2016-08-01 DIAGNOSIS — L570 Actinic keratosis: Secondary | ICD-10-CM | POA: Diagnosis not present

## 2016-08-01 DIAGNOSIS — L821 Other seborrheic keratosis: Secondary | ICD-10-CM | POA: Diagnosis not present

## 2016-08-01 DIAGNOSIS — Z85828 Personal history of other malignant neoplasm of skin: Secondary | ICD-10-CM | POA: Diagnosis not present

## 2016-08-01 DIAGNOSIS — D692 Other nonthrombocytopenic purpura: Secondary | ICD-10-CM | POA: Diagnosis not present

## 2016-08-01 DIAGNOSIS — D044 Carcinoma in situ of skin of scalp and neck: Secondary | ICD-10-CM | POA: Diagnosis not present

## 2016-08-01 DIAGNOSIS — D485 Neoplasm of uncertain behavior of skin: Secondary | ICD-10-CM | POA: Diagnosis not present

## 2016-08-01 DIAGNOSIS — D1801 Hemangioma of skin and subcutaneous tissue: Secondary | ICD-10-CM | POA: Diagnosis not present

## 2016-08-01 DIAGNOSIS — L853 Xerosis cutis: Secondary | ICD-10-CM | POA: Diagnosis not present

## 2016-08-15 ENCOUNTER — Telehealth: Payer: Self-pay

## 2016-08-15 NOTE — Telephone Encounter (Signed)
I called pt, spoke to pt's wife Lovetta, per DPR, and asked her to remind pt to bring his bipap to the appt with Dr. Rexene Alberts on Thursday. Pt's wife will remind the pt.

## 2016-08-17 ENCOUNTER — Ambulatory Visit (INDEPENDENT_AMBULATORY_CARE_PROVIDER_SITE_OTHER): Payer: Medicare Other | Admitting: Neurology

## 2016-08-17 ENCOUNTER — Encounter: Payer: Self-pay | Admitting: Neurology

## 2016-08-17 ENCOUNTER — Encounter (INDEPENDENT_AMBULATORY_CARE_PROVIDER_SITE_OTHER): Payer: Self-pay

## 2016-08-17 VITALS — BP 131/74 | HR 88 | Ht 71.0 in | Wt 189.0 lb

## 2016-08-17 DIAGNOSIS — G4733 Obstructive sleep apnea (adult) (pediatric): Secondary | ICD-10-CM | POA: Diagnosis not present

## 2016-08-17 DIAGNOSIS — G20 Parkinson's disease: Secondary | ICD-10-CM | POA: Diagnosis not present

## 2016-08-17 DIAGNOSIS — G4731 Primary central sleep apnea: Secondary | ICD-10-CM

## 2016-08-17 MED ORDER — PRAMIPEXOLE DIHYDROCHLORIDE 0.5 MG PO TABS: 0.5000 mg | ORAL_TABLET | Freq: Three times a day (TID) | ORAL | 3 refills | Status: DC

## 2016-08-17 MED ORDER — CARBIDOPA-LEVODOPA 25-100 MG PO TABS: ORAL_TABLET | ORAL | 3 refills | Status: DC

## 2016-08-17 NOTE — Progress Notes (Signed)
Subjective:    Patient ID: [name] is a 83 y.o. male.  HPI     Interim history:   [name] is a very pleasant 83 year old right-handed gentleman with an underlying medical history of hypertension, hyperlipidemia, history of cancer, ex-smoker, and complex sleep apnea on BiPAP ST, who presents for followup consultation off his left-sided predominant Parkinson's disease as well as his primary central sleep apnea and OSA. He is accompanied by his wife today. I last saw him on 12/28/15, and which time he was doing overall fairly well. He was adequate with his BiPAP compliance. He had noted an increase in his tremor on the left side and also increase in stiffness. No recent falls for reported. His primary care reduced his amlodipine, he had more drooling. He had more nasal discharge, usually clear and very drippy at times. I asked him to increase his Sinemet to 1 pill 6 times a day. I suggested he continue with Mirapex at the same dose and will monitor his leg swelling. He was reminded to increase his water intake.  Today, 08/17/2016: I reviewed his BiPAP ST compliance data from 07/18/2016 through 08/16/2016, which is a total of 30 days, during which time he used his BiPAP 29 days but percent used days greater than 4 hours was only 47%, indicating suboptimal compliance, average AHI 2 per hour, leak acceptable with the 95th percentile at 14.3 L/m, average usage of 4 hours. Pressure of 13/9 cm with a set rate of 10. He reports that his tremor seems a little worse. Thankfully, he has not fallen. Constipation is generally speaking under control with daily MiraLAX. He does not exercise very much and a day-to-day basis. His wife reports that he has a tendency to fall asleep in his lift chair and then wakes up in the middle of the night, goes to bed and puts his BiPAP on. He still drives, wife feels, it is still okay, tries to limit himself to daylight driving, familiar routes.    Previously (copied  from previous notes for reference):    I saw him on 06/28/2015, at which time he reported doing okay, tremor perhaps worse. He noticed no significant difference after we increase the Sinemet. Constipation was under control with MiraLAX. He was not exercising very much but does enjoy working in the yard. He had no recent falls. He was not using a cane or walker. He was compliant with his BiPAP ST. I suggested we keep his medications the same but monitor his leg swelling since he was on Mirapex.    I reviewed his BiPAP compliance data from 11/28/2015 through 12/27/2015, which is a total of 30 days, during which time he used his machine every night with percent used days greater than 4 hours at 73%, indicating adequate compliance with an average usage of only 4 hours and 21 minutes. Residual AHI 2 per hour, leak on the acceptable side with the 95th percentile at 13.4 L/m on a pressure of 13/9 with a rate of 10.   12/28/2014, at which time he reported doing fairly well, no recent falls, tremor somewhat worse and dexterity worse. He had noted some trouble with his turns at times. He was on Sinemet 4 times a day, usually at 6 AM, 10 AM, 2 PM and 8 PM. He had constipation and was on a stool softener and prunes. He was to have a colonoscopy on 01/04/2015.    I reviewed his BiPAP ST compliance data from 05/29/2015 through 06/27/2015, which  is a total of 30 days during which time he used his machine every night with percent used days greater than 4 hours at 97%, indicating excellent compliance with an average usage of 5 hours and 16 minutes, residual AHI 1.5 per hour, leak acceptable with the 95th percentile at 12.4 L/m on a pressure of 13/9 cm with a rate of 10.   I saw him on 07/09/2014, at whicht time he reported feeling stable for the most part. He had not experienced any drastic changes in his symptoms. He reported no falls. He had no significant low back pain and no longer was taking any narcotics.  Unfortunately, his wife was involved in a car accident and sustained broken ribs and had to be in the ICU for some time. He was using his BiPAP machine regularly. He reported one skip night because of a stomach bug. Overall, he felt he was sleeping well. He felt that the increase in Sinemet helped his trembling. He was tolerating his medication. He had no new major mood or memory issues.    I reviewed his BiPAP compliance data from 11/23/2014 through 12/22/2014 which is a total of 30 days during which time he used his machine every night with percent used days greater than 4 hours at 93%, indicating excellent compliance with an average usage of 4 hours and 54 minutes, residual AHI low at 1.7 per hour, leaked low with the 95th percentile at 7.4 L/m on a pressure of 13/9 cm with a backup rate of 10.    I saw him on 03/03/2014, at which time he reported experiencing thick mucus first thing in the morning. His tremor was worse per wife. He received new supplies recently. He has some thick mucus first thing in the morning. His tremor has become worse per wife. His back surgery in November 2015 had relieved much of his pain. He was no longer on pain medication. He was taking stool softeners for his constipation. I suggested he continue with Mirapex 3 times a day but I did ask him to increase Sinemet to 4 times a day.   I reviewed his BiPAP compliance data from 06/07/2014 through 07/06/2014 which is a total of 30 days during which time he used his machine 29 days with percent used days greater than 4 hours at 87%, indicating very good compliance with an average usage of 5 hours and 2 minutes, residual AHI low at 1.6 per hour, leak low with the 95th percentile at 7.6 L/m on a pressure of 13/9 with a rate of 10.   I saw him on 12/02/2013, at which time he was compliant with BiPAP ST. He had spine surgery under Dr. Cyndy Freeze on 12/04/2013 which went well. He is in physical therapy. He had an L spine MRI on 11/06/13:  Degenerative lumbar spondylosis with multilevel disc disease and facet disease. There is bilateral lateral recess and bilateral foraminal stenosis at L2-3 and L3-4. The most significant level however is L4-5 with there is severe spinal, bilateral lateral recess and foraminal stenosis. We talked about his fluid intake. He was still not consistent with his dose timings for his Sinemet and Mirapex.   I reviewed his compliance data from 01/27/2014 through 02/25/2014 which is a total of 30 days during which time he used his machine every night with percent used days greater than 4 hours of 87%, indicating very good compliance with an average usage of 5 hours and 6 minutes, residual AHI of 3.5 per hour and leak  acceptable with the 95th percentile at 15 L/m.   I saw him on 05/27/2013, at which time he reported being compliant with his BiPAP, averaging 4-5 hours each night. He was still taking his Sinemet with his meals on most days. He felt his tremor was a little worse. His wife felt that he was otherwise stable. He denied any new cognitive issues, depression, hallucinations, anxiety, delusions. He was drinking 3 cups of coffee in the morning and not enough water. I did not increase his medication but asked him to take his Sinemet away from his mealtimes. I considered Linzess for chronic constipation but asked him to increase his water intake and watch constipation symptoms before we use medications.   I reviewed his compliance data from 10/31/2013 through 11/29/2013 which is a total of 30 days during which time he used his machine every night except for 1 night. Percent used days greater than 4 hours was 90% indicating excellent compliance. Residual AHI at 2.5 per hour, leaked low. Pressure at 13/9 with a rate of 10.   I saw him on 11/27/2012, at which time I did not change his medication regimen with the exception of the timing for his Sinemet and Mirapex: I advised him to take it at 7 AM, 11 AM and 4 PM. I  also asked him to continue using his BiPAP regularly and take it with him on any vacation trips. He was congratulated on his compliance.   I saw him on 07/26/2012 after had his sleep study. He has been compliant on BiPAP therapy. I had increased his Sinemet to one pill 3 times a day. He was taking it with his meals and did not note any significant improvement in his symptoms, but, then again, he was taking it right after his meals. He was asking whether he should take his BiPAP machine with him to his planned trip to [place].     I first met him on 02/19/2012 after his baseline sleep study. His total AHI was 39.5 per hour based primarily on central apneas. His obstructive AHI was around 15 per hour. His oxyhemoglobin desaturation nadir was 85% and he spent 3 hours and 31 minutes below the saturation of 90% for the night. I asked him to come back for a CPAP titration study, possibly BiPAP but he wanted to hold off until his appointment in April as he was going to be out of town and he also wanted to bring his wife for discussion.   I then saw him back on 04/17/2012 and again went over his test results with him and his wife. He had been doing well from the PD standpoint. He agreed to come back for another sleep study with full night titration. His sleep titration study was on 05/14/2012 and I went over his test results with him and his wife in detail during our visit in July. Sleep efficiency was reduced at 71.5% with a latency to sleep of 2 minutes. Wake after sleep onset was highly elevated at 124 minutes with mild to moderate sleep fragmentation noted. He had increased percentage of REM sleep at 27.2%. He had a normal REM latency. There were no significant aortic leg movements. He was started on CPAP at a pressure of 5 cm and titrated up to 9 cm of water pressure but he had significant central apneas and therefore changed to BiPAP at 11/7 and then switch to ST mode for ongoing central events. His final  pressure was 13/9 with a backup rate  of 10 on which she had a residual AHI of 0 per hour and supine REM sleep achieved. Oxyhemoglobin desaturation nadir on the final pressure was 90%.   He was placed on BiPAP ST at 13/9 cm with a backup rate of 10. I reviewed his compliance from 06/26/2012 through 07/25/2012 (29 days), during which time he used it every day. His average usage was 5 hours and 6 minutes and percent used days greater than 4 hours was 96% indicating excellent compliance. His residual AHI was around 6 indicating fairly reasonable pressure settings. He reported tolerating the treatment and he changed from a FFM to a nasal mask. He denied depression, memory loss, lightheadedness, or hallucinations. I also reviewed more recent compliance data from 09/29/2012 through 10/28/2012 (30 days), during which time he used his machine every day. His average usage was 5 hours and 23 minutes, his percent used days greater than 4 hours was 29 days, which is 97%, indicating excellent compliance. His residual AHI was 2.6 per hour indicating an appropriate treatment setting of 13/9 cm with a backup rate of 10 per minute.   His Past Medical History Is Significant For: Past Medical History:  Diagnosis Date  . Arthritis   . Cancer [facility])    h/o  skin  cancer  . Ex-smoker   . GERD (gastroesophageal reflux disease)   . Hypercholesteremia   . Hypertension   . Lumbar stenosis   . Parkinson's disease ([place])   . Sleep apnea    ?? sleep apnea....tested 2013 @ [facility]     . Sleep disorder     His Past Surgical History Is Significant For: Past Surgical History:  Procedure Laterality Date  . APPENDECTOMY    . BACK SURGERY  11/2013  . COLONOSCOPY  11/17/2008   Rourk: Sessile polyp in the cecum status post saline-assisted piecemeal polypectomy, resolution clipping and epinephrine injection therapy performed. Shallow sigmoid diverticula.. Tubular adenoma  . COLONOSCOPY  11/19/2008   Fields: Large  amount of liquid blood with clots seen throughout the colon. Previous [place] resolution clip remained in place. Purple spot seen in the lateral aspect polypectomy site, 3 resolution clips placed here. 6 mm sessile ascending colon polyp seen and not manipulated.  . COLONOSCOPY  11/25/2008   Rourk: Blood-tinged colonic effluent, suspect recurrent post polypectomy bleeding status post placement of 3 clips on the polypectomy site on top of the 3 clips that already existed, one had fallen off since last procedure. One small polyp distal to the cecum not manipulated  . COLONOSCOPY  12/06/2009   Rourk: Internal hemorrhoids, scattered left-sided diverticula, cecal polyps, one snared and subsequently clipped, 2 adjacent diminutive polyps ablated. tubular adenoma  . COLONOSCOPY N/A 01/21/2015   Dr. Gala Romney: 1.5 cm carpet-type polyp just distal to the ileocecal valve removed piecemeal fashion and ablated. Small polyp in the rectum. Pathology-tubular adenomas  . COLONOSCOPY N/A 09/01/2015   Procedure: COLONOSCOPY;  Surgeon: Daneil Dolin, MD;  Location: [facility];  Service: Endoscopy;  Laterality: N/A;  845  . EYE SURGERY     bilateral  cataracts  . MENISCUS REPAIR     right knee  2000  . POLYPECTOMY  09/01/2015   Procedure: POLYPECTOMY;  Surgeon: Daneil Dolin, MD;  Location: [facility];  Service: Endoscopy;;  colon  . TONSILLECTOMY Bilateral     His Family History Is Significant For: Family History  Problem Relation Age of Onset  . Dementia Mother   . Cancer Brother   .  Stroke Brother     His Social History Is Significant For: Social History   Social History  . Marital status: Married    Spouse name: Clinical research associate  . Number of children: 4  . Years of education: 43   Occupational History  . Retired     Social History Main Topics  . Smoking status: Former Smoker    Packs/day: 1.00    Types: Cigarettes, Pipe    Start date: 01/22/1951    Quit date: 01/21/1989  . Smokeless tobacco: Never Used   . Alcohol use No  . Drug use: No  . Sexual activity: Not Asked   Other Topics Concern  . None   Social History Narrative  . None    His Allergies Are:  No Known Allergies:   His Current Medications Are:  Outpatient Encounter Prescriptions as of 08/17/2016  Medication Sig  . amLODipine (NORVASC) 5 MG tablet Take 5 mg by mouth daily.  . calcium gluconate 500 MG tablet Take 500 mg by mouth daily.  . carbidopa-levodopa (SINEMET IR) 25-100 MG tablet Take 1 pill 6 times a day: 6, 9, 12PM, 3PM, 6PM, and 9 PM  . glucosamine-chondroitin 500-400 MG tablet Take 1 tablet by mouth once.  . Multiple Vitamin (MULTIVITAMIN) tablet Take 1 tablet by mouth daily.  Marland Kitchen omeprazole (PRILOSEC OTC) 20 MG tablet Take 20 mg by mouth daily.  . pramipexole (MIRAPEX) 0.5 MG tablet Take 1 tablet (0.5 mg total) by mouth 3 (three) times daily.  . valsartan-hydrochlorothiazide (DIOVAN-HCT) 80-12.5 MG tablet   . vitamin C (ASCORBIC ACID) 500 MG tablet Take 500 mg by mouth daily.  . [DISCONTINUED] amLODipine (NORVASC) 10 MG tablet Take 5 mg by mouth daily.   . [DISCONTINUED] telmisartan-hydrochlorothiazide (MICARDIS HCT) 80-25 MG per tablet Take 1 tablet by mouth daily.   No facility-administered encounter medications on file as of 08/17/2016.   :  Review of Systems:  Out of a complete 14 point review of systems, all are reviewed and negative with the exception of these symptoms as listed below:  Review of Systems  Neurological:       Pt presents today to discuss his PD and cpap. Pt feels that his tremors are a little worse than they were 4-6 months ago. Pt reports that his bipap is going well.    Objective:  Neurological Exam  Physical Exam Physical Examination:   Vitals:   08/17/16 1153  BP: 131/74  Pulse: 88    General Examination: The patient is a very pleasant 83 y.o. male in no acute distress. He appears well-developed and well-nourished and well groomed.   HEENT exam: He has a moderate degree  of nuchal rigidity with a mild to moderate lower jaw and lip tremor, fairly constant. He has a moderately masked facies with decreased eye blink rate. Hearing is mildly impaired. He is status post cataract repairs bilaterally. He has prescription eyeglasses. Speech is moderately hypophonic, and he has minimal dysarthria and minimal drooling is noted, stable. Pupils are equal, round and reactive to light. On extraocular tracking he has mild saccadic breakdown.  Chest is clear to auscultation without wheezing or rhonchi noted.  Heart sounds are normal with a slight systolic heart murmur (2/6), no rubs or gallops noted, unchanged.  Abdomen is soft, nontender with normal bowel sounds appreciated.  There is 1+ pitting edema in the distal lower extremities bilaterally, left more than right.    No joint deformities are noted.   Skin is warm and dry but  he does have several old appearing bruises across the dorsi of his hands, stable, chronic hyperpigmentation/discoloration.  Neurologically: Mental status: The patient is awake, alert and oriented in all 3 spheres. His memory, attention, language and knowledge are appropriate. Cranial nerves are as described under HEENT exam. In addition airway exam reveals a moderately tight airway secondary to a narrow airway entry. Tongue protrudes centrally and palate elevates symmetrically. Motor exam: Normal bulk and strength is noted. Tone is increased with some cogwheeling noted in both upper extremities, L more than R. Tone is also increased in the left lower extremity. Tone is mildly increased on the right side. He has a moderate constant resting tremor in the left upper extremity. He has an intermittent mild resting tremor on the right upper extremity. Fine motor skills with finger taps and hand movements as well as rapid alternating patting are moderately to severely impaired on the left and moderately so on the right. Foot taps are moderately impaired on the right  and moderately to severely so on the left. He stands up from the seated position with mild problems and his posture is moderately stooped. He walks with decreased arm swing bilaterally, L more than right. He has a fairly good stride length but decreased pace and turns in 3 steps. His balance is fairly well preserved, but he did have mild insecurity with turning. Reflexes are 1+ in the UEs and absent in the LEs. Sensory exam is unchanged from before. Cerebellar testing shows no dysmetria or intention tremor on finger to nose testing.    Assessment and Plan:   In summary, [name] is a very pleasant 83 year old male with an underlying medical history of hypertension, hyperlipidemia, history of cancer, ex-smoker, and complex sleep apnea on BiPAP ST, who presents for followup consultation off his left-sided predominant Parkinson's disease as well as his sleep apnea. PD symptoms date back to about 10 years ago, per wife's estimate. His history is complicated by chronic constipation, severe mixed sleep apnea which is treated well with BiPAP ST, chronic low back pain for which he had surgery on 12/04/2013 with good success. He is tolerating the Sinemet which is currently at 1 pill 6 times a day. We increase this in December 2017 from 1 pill 5 times a day. He is taking the medication about every 3 hours starting around 6 AM. His lower extremity swelling seems a little worse. He is encouraged to continue to wear his BiPAP ST regularly and try to sleep in his bed, rather than falling asleep in his recliner/lift chair. He is furthermore advised to stay well hydrated, monitor his constipation, monitor his driving, monitor his memory and mood and also try to increase his exercise regimen.  I suggested a 6 month follow-up, sooner as needed. He is going to continue with Mirapex 0.5 mg 3 times a day as well. I renewed his prescriptions today for 90 day supply through mail order. I answered all their questions today  and the patient and his wife were in agreement.  I spent 25 minutes in total face-to-face time with the patient, more than 50% of which was spent in counseling and coordination of care, reviewing test results, reviewing medication and discussing or reviewing the diagnosis of PD and CSA/OSA, the prognosis and treatment options. Pertinent laboratory and imaging test results that were available during this visit with the patient were reviewed by me and considered in my medical decision making (see chart for details).

## 2016-08-17 NOTE — Patient Instructions (Addendum)
We will keep your medications the same, your tremor is indeed a little worse on the left and neck stiffness is also a little worse.  Follow up in 6 months, please use your BiPAP all night.

## 2016-08-30 DIAGNOSIS — H401131 Primary open-angle glaucoma, bilateral, mild stage: Secondary | ICD-10-CM | POA: Diagnosis not present

## 2016-10-16 ENCOUNTER — Other Ambulatory Visit: Payer: Self-pay | Admitting: Neurology

## 2016-10-16 DIAGNOSIS — G20 Parkinson's disease: Secondary | ICD-10-CM

## 2016-10-31 DIAGNOSIS — Z23 Encounter for immunization: Secondary | ICD-10-CM | POA: Diagnosis not present

## 2016-11-02 DIAGNOSIS — D1801 Hemangioma of skin and subcutaneous tissue: Secondary | ICD-10-CM | POA: Diagnosis not present

## 2016-11-02 DIAGNOSIS — D485 Neoplasm of uncertain behavior of skin: Secondary | ICD-10-CM | POA: Diagnosis not present

## 2016-11-02 DIAGNOSIS — L821 Other seborrheic keratosis: Secondary | ICD-10-CM | POA: Diagnosis not present

## 2016-11-02 DIAGNOSIS — L570 Actinic keratosis: Secondary | ICD-10-CM | POA: Diagnosis not present

## 2016-11-02 DIAGNOSIS — C44319 Basal cell carcinoma of skin of other parts of face: Secondary | ICD-10-CM | POA: Diagnosis not present

## 2016-11-02 DIAGNOSIS — D225 Melanocytic nevi of trunk: Secondary | ICD-10-CM | POA: Diagnosis not present

## 2016-11-02 DIAGNOSIS — D692 Other nonthrombocytopenic purpura: Secondary | ICD-10-CM | POA: Diagnosis not present

## 2016-11-02 DIAGNOSIS — Z85828 Personal history of other malignant neoplasm of skin: Secondary | ICD-10-CM | POA: Diagnosis not present

## 2016-11-23 DIAGNOSIS — C44319 Basal cell carcinoma of skin of other parts of face: Secondary | ICD-10-CM | POA: Diagnosis not present

## 2016-11-23 DIAGNOSIS — Z85828 Personal history of other malignant neoplasm of skin: Secondary | ICD-10-CM | POA: Diagnosis not present

## 2016-12-05 DIAGNOSIS — H401131 Primary open-angle glaucoma, bilateral, mild stage: Secondary | ICD-10-CM | POA: Diagnosis not present

## 2016-12-21 DIAGNOSIS — R7301 Impaired fasting glucose: Secondary | ICD-10-CM | POA: Diagnosis not present

## 2016-12-21 DIAGNOSIS — I10 Essential (primary) hypertension: Secondary | ICD-10-CM | POA: Diagnosis not present

## 2016-12-21 DIAGNOSIS — E782 Mixed hyperlipidemia: Secondary | ICD-10-CM | POA: Diagnosis not present

## 2017-01-01 DIAGNOSIS — G20 Parkinson's disease: Secondary | ICD-10-CM | POA: Diagnosis not present

## 2017-01-01 DIAGNOSIS — D696 Thrombocytopenia, unspecified: Secondary | ICD-10-CM | POA: Diagnosis not present

## 2017-01-01 DIAGNOSIS — I714 Abdominal aortic aneurysm, without rupture: Secondary | ICD-10-CM | POA: Diagnosis not present

## 2017-01-01 DIAGNOSIS — I10 Essential (primary) hypertension: Secondary | ICD-10-CM | POA: Diagnosis not present

## 2017-01-12 ENCOUNTER — Other Ambulatory Visit: Payer: Self-pay | Admitting: Internal Medicine

## 2017-01-12 DIAGNOSIS — I714 Abdominal aortic aneurysm, without rupture, unspecified: Secondary | ICD-10-CM

## 2017-01-19 ENCOUNTER — Other Ambulatory Visit: Payer: Self-pay | Admitting: Internal Medicine

## 2017-01-31 ENCOUNTER — Other Ambulatory Visit (HOSPITAL_COMMUNITY): Payer: Self-pay | Admitting: Internal Medicine

## 2017-01-31 DIAGNOSIS — I714 Abdominal aortic aneurysm, without rupture, unspecified: Secondary | ICD-10-CM

## 2017-02-02 ENCOUNTER — Ambulatory Visit (HOSPITAL_COMMUNITY)
Admission: RE | Admit: 2017-02-02 | Discharge: 2017-02-02 | Disposition: A | Discharge status: Home / Self Care | Payer: Medicare Other | Source: Ambulatory Visit | Attending: Internal Medicine | Admitting: Internal Medicine

## 2017-02-02 DIAGNOSIS — I714 Abdominal aortic aneurysm, without rupture, unspecified: Secondary | ICD-10-CM

## 2017-02-09 DIAGNOSIS — L821 Other seborrheic keratosis: Secondary | ICD-10-CM | POA: Diagnosis not present

## 2017-02-09 DIAGNOSIS — L570 Actinic keratosis: Secondary | ICD-10-CM | POA: Diagnosis not present

## 2017-02-09 DIAGNOSIS — Z85828 Personal history of other malignant neoplasm of skin: Secondary | ICD-10-CM | POA: Diagnosis not present

## 2017-02-09 DIAGNOSIS — L812 Freckles: Secondary | ICD-10-CM | POA: Diagnosis not present

## 2017-02-09 DIAGNOSIS — D692 Other nonthrombocytopenic purpura: Secondary | ICD-10-CM | POA: Diagnosis not present

## 2017-02-14 ENCOUNTER — Encounter: Payer: Self-pay | Admitting: Cardiovascular Disease

## 2017-02-14 ENCOUNTER — Ambulatory Visit (INDEPENDENT_AMBULATORY_CARE_PROVIDER_SITE_OTHER): Payer: Medicare Other | Admitting: Cardiovascular Disease

## 2017-02-14 VITALS — BP 140/88 | HR 104 | Ht 71.0 in | Wt 191.0 lb

## 2017-02-14 DIAGNOSIS — I714 Abdominal aortic aneurysm, without rupture, unspecified: Secondary | ICD-10-CM

## 2017-02-14 DIAGNOSIS — I10 Essential (primary) hypertension: Secondary | ICD-10-CM | POA: Diagnosis not present

## 2017-02-14 DIAGNOSIS — I350 Nonrheumatic aortic (valve) stenosis: Secondary | ICD-10-CM | POA: Diagnosis not present

## 2017-02-14 NOTE — Patient Instructions (Addendum)
Medication Instructions:  Your physician recommends that you continue on your current medications as directed. Please refer to the Current Medication list given to you today.   Labwork: NONE   Testing/Procedures: Your physician has requested that you have an echocardiogram. Echocardiography is a painless test that uses sound waves to create images of your heart. It provides your doctor with information about the size and shape of your heart and how well your heart's chambers and valves are working. This procedure takes approximately one hour. There are no restrictions for this procedure.    Follow-Up: Your physician wants you to follow-up in: 1 Year with Dr. Bronson Ing. You will receive a reminder letter in the mail two months in advance. If you don't receive a letter, please call our office to schedule the follow-up appointment.   Any Other Special Instructions Will Be Listed Below (If Applicable).     If you need a refill on your cardiac medications before your next appointment, please call your pharmacy. Thank you for choosing [facility]!

## 2017-02-14 NOTE — Progress Notes (Signed)
SUBJECTIVE: The patient presents for annual follow-up of aortic stenosis and abdominal aortic aneurysm.  Most recent abdominal ultrasound performed on 02/02/17 demonstrated a 3.9 cm abdominal aortic aneurysm, previously 3.7 cm.  Echocardiogram 07/05/15: Normal left ventricular systolic function and regional wall motion, LVEF 60-65%, mild LVH, grade 1 diastolic dysfunction, moderate aortic stenosis, mean gradient 26 mmHg, mild aortic root dilatation 41 mm, mild mitral regurgitation.  Overall, he is feeling well.  He denies shortness of breath, dizziness, and syncope.  He has mild chronic lower extremity edema.  ECG performed today which I interpreted demonstrated sinus tachycardia, 102 bpm.    Review of Systems: As per "subjective", otherwise negative.  No Known Allergies  Current Outpatient Medications  Medication Sig Dispense Refill  . amLODipine (NORVASC) 5 MG tablet Take 5 mg by mouth daily.    . calcium gluconate 500 MG tablet Take 500 mg by mouth daily.    . carbidopa-levodopa (SINEMET IR) 25-100 MG tablet Take 1 pill 6 times a day: 6, 9, 12PM, 3PM, 6PM, and 9 PM 540 tablet 3  . glucosamine-chondroitin 500-400 MG tablet Take 1 tablet by mouth once.    . Multiple Vitamin (MULTIVITAMIN) tablet Take 1 tablet by mouth daily.    Marland Kitchen omeprazole (PRILOSEC OTC) 20 MG tablet Take 20 mg by mouth daily.    . pramipexole (MIRAPEX) 0.5 MG tablet Take 1 tablet (0.5 mg total) by mouth 3 (three) times daily. 270 tablet 3  . valsartan-hydrochlorothiazide (DIOVAN-HCT) 80-12.5 MG tablet     . vitamin C (ASCORBIC ACID) 500 MG tablet Take 500 mg by mouth daily.     No current facility-administered medications for this visit.     Past Medical History:  Diagnosis Date  . Arthritis   . Cancer [facility])    h/o  skin  cancer  . Ex-smoker   . GERD (gastroesophageal reflux disease)   . Hypercholesteremia   . Hypertension   . Lumbar stenosis   . Parkinson's disease ([place])   . Sleep apnea    ??  sleep apnea....tested 2013 @ [facility]     . Sleep disorder     Past Surgical History:  Procedure Laterality Date  . APPENDECTOMY    . BACK SURGERY  11/2013  . COLONOSCOPY  11/17/2008   Rourk: Sessile polyp in the cecum status post saline-assisted piecemeal polypectomy, resolution clipping and epinephrine injection therapy performed. Shallow sigmoid diverticula.. Tubular adenoma  . COLONOSCOPY  11/19/2008   Fields: Large amount of liquid blood with clots seen throughout the colon. Previous [place] resolution clip remained in place. Purple spot seen in the lateral aspect polypectomy site, 3 resolution clips placed here. 6 mm sessile ascending colon polyp seen and not manipulated.  . COLONOSCOPY  11/25/2008   Rourk: Blood-tinged colonic effluent, suspect recurrent post polypectomy bleeding status post placement of 3 clips on the polypectomy site on top of the 3 clips that already existed, one had fallen off since last procedure. One small polyp distal to the cecum not manipulated  . COLONOSCOPY  12/06/2009   Rourk: Internal hemorrhoids, scattered left-sided diverticula, cecal polyps, one snared and subsequently clipped, 2 adjacent diminutive polyps ablated. tubular adenoma  . COLONOSCOPY N/A 01/21/2015   Dr. Gala Romney: 1.5 cm carpet-type polyp just distal to the ileocecal valve removed piecemeal fashion and ablated. Small polyp in the rectum. Pathology-tubular adenomas  . COLONOSCOPY N/A 09/01/2015   Procedure: COLONOSCOPY;  Surgeon: Daneil Dolin, MD;  Location: [facility];  Service:  Endoscopy;  Laterality: N/A;  845  . EYE SURGERY     bilateral  cataracts  . MENISCUS REPAIR     right knee  2000  . POLYPECTOMY  09/01/2015   Procedure: POLYPECTOMY;  Surgeon: Daneil Dolin, MD;  Location: [facility];  Service: Endoscopy;;  colon  . TONSILLECTOMY Bilateral     Social History   Socioeconomic History  . Marital status: Married    Spouse name: Clinical research associate  . Number of children: 4    . Years of education: 26  . Highest education level: Not on file  Social Needs  . Financial resource strain: Not on file  . Food insecurity - worry: Not on file  . Food insecurity - inability: Not on file  . Transportation needs - medical: Not on file  . Transportation needs - non-medical: Not on file  Occupational History  . Occupation: Retired   Tobacco Use  . Smoking status: Former Smoker    Packs/day: 1.00    Types: Cigarettes, Pipe    Start date: 01/[birth year]/1951    Last attempt to quit: 01/21/1989    Years since quitting: 28.0  . Smokeless tobacco: Never Used  Substance and Sexual Activity  . Alcohol use: No    Alcohol/week: 0.0 oz  . Drug use: No  . Sexual activity: Not on file  Other Topics Concern  . Not on file  Social History Narrative  . Not on file     Vitals:   02/14/17 0903  BP: 140/88  Pulse: (!) 104  SpO2: 98%  Weight: 191 lb (86.6 kg)  Height: 5\' 11"  (1.803 m)    Wt Readings from Last 3 Encounters:  02/14/17 191 lb (86.6 kg)  08/17/16 189 lb (85.7 kg)  02/09/16 197 lb (89.4 kg)     PHYSICAL EXAM General: NAD HEENT: Normal. Neck: No JVD, no thyromegaly. Lungs: Clear to auscultation bilaterally with normal respiratory effort. CV: Regular rate and rhythm, normal S1/diminished S2, no S3/S4, III/VI crescendo-decrescendo systolic murmur heard throughout the precordium. Trace bilateral leg edema.  Abdomen: Soft, nontender, no distention.  Neurologic: Alert and oriented.  Bilateral hand tremors. Psych: Normal affect. Skin: Normal. Musculoskeletal: No gross deformities.    ECG: Most recent ECG reviewed.   Labs: Lab Results  Component Value Date/Time   K 3.8 11/28/2013 09:32 AM   BUN 30 (H) 11/28/2013 09:32 AM   CREATININE 1.23 11/28/2013 09:32 AM   ALT 24 11/19/2008 02:25 PM   HGB 15.0 11/28/2013 09:32 AM     Lipids: No results found for: LDLCALC, LDLDIRECT, CHOL, TRIG, HDL     ASSESSMENT AND PLAN:  1. Aortic stenosis: Symptomatically  stable.  Moderate with mean gradient 26 mmHg on 07/05/15. I will obtain an echocardiogram to assess for interval changes.  2. Essential HTN: Mildly elevated today on amlodipine 5 mg and valsartan-HCTZ 80-12.5 mg. Will monitor.  3. Hyperlipidemia: Not on statin therapy.  4. Sleep apnea: On BiPAP.  5. Orthostatic hypotension: No further episodes. He likely has a degree of autonomic dysfunction given his Parkinson's Disease.  6. Parkinson's disease: On Sinemet.  He is followed by neurology in [place] (Dr. Rexene Alberts).  7. AAA: 3.9 cm in January 2019 as detailed above.  I will repeat an ultrasound in 2 years.     Disposition: Follow up 1 year   Kate Sable, M.D., F.A.C.C.

## 2017-02-15 ENCOUNTER — Telehealth: Payer: Self-pay

## 2017-02-15 NOTE — Telephone Encounter (Signed)
Pt returning call is aware he will need to bring his bipap to apt on Monday

## 2017-02-15 NOTE — Telephone Encounter (Signed)
I called pt to remind him to bring his bipap to his appt with Dr. Rexene Alberts on Monday. No answer, left a message asking him to call us back. If pt calls back, please remind him.

## 2017-02-16 ENCOUNTER — Ambulatory Visit (HOSPITAL_COMMUNITY)
Admission: RE | Admit: 2017-02-16 | Discharge: 2017-02-16 | Disposition: A | Discharge status: Home / Self Care | Payer: Medicare Other | Source: Ambulatory Visit | Attending: Cardiovascular Disease | Admitting: Cardiovascular Disease

## 2017-02-16 DIAGNOSIS — E785 Hyperlipidemia, unspecified: Secondary | ICD-10-CM | POA: Insufficient documentation

## 2017-02-16 DIAGNOSIS — I350 Nonrheumatic aortic (valve) stenosis: Secondary | ICD-10-CM

## 2017-02-16 DIAGNOSIS — I080 Rheumatic disorders of both mitral and aortic valves: Secondary | ICD-10-CM | POA: Insufficient documentation

## 2017-02-16 DIAGNOSIS — I119 Hypertensive heart disease without heart failure: Secondary | ICD-10-CM | POA: Diagnosis not present

## 2017-02-16 DIAGNOSIS — K219 Gastro-esophageal reflux disease without esophagitis: Secondary | ICD-10-CM | POA: Diagnosis not present

## 2017-02-16 DIAGNOSIS — G20 Parkinson's disease: Secondary | ICD-10-CM | POA: Diagnosis not present

## 2017-02-16 DIAGNOSIS — I77810 Thoracic aortic ectasia: Secondary | ICD-10-CM | POA: Insufficient documentation

## 2017-02-16 DIAGNOSIS — G4733 Obstructive sleep apnea (adult) (pediatric): Secondary | ICD-10-CM | POA: Diagnosis not present

## 2017-02-16 LAB — ECHOCARDIOGRAM COMPLETE
AOPV: 0.19 m/s
AOVTI: 81 cm
AVCELMEANRAT: 0.2
AVG: 27 mmHg
AVPG: 50 mmHg
AVPKVEL: 353 cm/s
CHL CUP DOP CALC LVOT VTI: 18 cm
CHL CUP MV DEC (S): 489
DOP CAL AO MEAN VELOCITY: 239 cm/s
E/e' ratio: 8.97
EWDT: 489 ms
FS: 40 % (ref 28–44)
IVS/LV PW RATIO, ED: 1.13
LA ID, A-P, ES: 39 mm
LA vol A4C: 59.1 ml
LA vol index: 26.6 mL/m2
LA vol: 55.7 mL
LADIAMINDEX: 1.86 cm/m2
LEFT ATRIUM END SYS DIAM: 39 mm
LV E/e' medial: 8.97
LV PW d: 13.4 mm — AB (ref 0.6–1.1)
LV dias vol index: 40 mL/m2
LV dias vol: 84 mL (ref 62–150)
LV e' LATERAL: 6.2 cm/s
LVEEAVG: 8.97
LVOT peak VTI: 0.22 cm
LVOT peak grad rest: 2 mmHg
LVOT peak vel: 66.1 cm/s
MV pk A vel: 91.7 m/s
MV pk E vel: 55.6 m/s
RV LATERAL S' VELOCITY: 13.8 cm/s
RV TAPSE: 25.6 mm
TDI e' lateral: 6.2
TDI e' medial: 4.57

## 2017-02-16 NOTE — Progress Notes (Signed)
*  PRELIMINARY RESULTS* Echocardiogram 2D Echocardiogram has been performed.  [name] 02/16/2017, 11:08 AM

## 2017-02-18 ENCOUNTER — Encounter: Payer: Self-pay | Admitting: Neurology

## 2017-02-19 ENCOUNTER — Encounter: Payer: Self-pay | Admitting: Neurology

## 2017-02-19 ENCOUNTER — Other Ambulatory Visit: Payer: Self-pay | Admitting: Neurology

## 2017-02-19 ENCOUNTER — Ambulatory Visit (INDEPENDENT_AMBULATORY_CARE_PROVIDER_SITE_OTHER): Payer: Medicare Other | Admitting: Neurology

## 2017-02-19 VITALS — BP 131/71 | HR 96 | Ht 71.0 in | Wt 187.0 lb

## 2017-02-19 DIAGNOSIS — G20 Parkinson's disease: Secondary | ICD-10-CM

## 2017-02-19 DIAGNOSIS — G4733 Obstructive sleep apnea (adult) (pediatric): Secondary | ICD-10-CM

## 2017-02-19 DIAGNOSIS — G4731 Primary central sleep apnea: Secondary | ICD-10-CM | POA: Diagnosis not present

## 2017-02-19 MED ORDER — CARBIDOPA-LEVODOPA 25-100 MG PO TABS: ORAL_TABLET | ORAL | 3 refills | Status: DC

## 2017-02-19 NOTE — Progress Notes (Signed)
Subjective:    Patient ID: [name] is a 84 y.o. male.  HPI     Interim history:   [name] is a very pleasant 84 year old right-handed gentleman with an underlying medical history of hypertension, hyperlipidemia, history of cancer, ex-smoker, and complex sleep apnea on BiPAP ST, who presents for followup consultation off his left-sided predominant Parkinson's disease as well as his primary central sleep apnea, and OSA on BiPAP ST. He is accompanied by his daughter from [place] today. I last saw him on 08/17/2016, at which time he was suboptimal with his BiPAP compliance. He felt that his tremor was worse. He had no recent falls, constipation was also under control. He was not always exercising on a regular basis. His wife was concerned about his daytime somnolence. His wife felt that his driving was still okay, he limited his driving to daylight driving in familiar routes. I asked him to continue with Sinemet 1 pill 6 times a day and Mirapex 0.5 mg 3 times a day. I asked him to monitor his driving and to be consistent with his BiPAP usage.  Today, 02/19/2017: I reviewed his BiPAP ST compliance data from 01/20/2017 through 02/24/2017, during which time he used his BiPAP machine every night but percent used days greater than 4 hours was only 17%, indicating suboptimal compliance with an average usage of 3 hours and 18 minutes. Residual AHI at goal at 2.1 per hour, leak acceptable with the 95th percentile at 17.9 L/m on a pressure of 13/9 with a backup rate of 10. He reports  doing okay, constipation not a big problem currently. He still drives. Unfortunately, his wife is currently in the hospital in the ICU. Their daughter, [name], is visiting from [place]. He is not always optimal with his hydration. Thankfully, he has not fallen. He does feel stiff for and slower. He has issues with drooling consistently. Memory wise and mood wise he is fairly stable.   The patient's allergies, current  medications, family history, past medical history, past social history, past surgical history and problem list were reviewed and updated as appropriate.   Previously (copied from previous notes for reference):    I saw him on 12/28/15, and which time he was doing overall fairly well. He was adequate with his BiPAP compliance. He had noted an increase in his tremor on the left side and also increase in stiffness. No recent falls for reported. His primary care reduced his amlodipine, he had more drooling. He had more nasal discharge, usually clear and very drippy at times. I asked him to increase his Sinemet to 1 pill 6 times a day. I suggested he continue with Mirapex at the same dose and will monitor his leg swelling. He was reminded to increase his water intake.   I reviewed his BiPAP ST compliance data from 07/18/2016 through 08/16/2016, which is a total of 30 days, during which time he used his BiPAP 29 days but percent used days greater than 4 hours was only 47%, indicating suboptimal compliance, average AHI 2 per hour, leak acceptable with the 95th percentile at 14.3 L/m, average usage of 4 hours. Pressure of 13/9 cm with a set rate of 10.    I saw him on 06/28/2015, at which time he reported doing okay, tremor perhaps worse. He noticed no significant difference after we increase the Sinemet. Constipation was under control with MiraLAX. He was not exercising very much but does enjoy working in the yard. He had no recent falls.  He was not using a cane or walker. He was compliant with his BiPAP ST. I suggested we keep his medications the same but monitor his leg swelling since he was on Mirapex.    I reviewed his BiPAP compliance data from 11/28/2015 through 12/27/2015, which is a total of 30 days, during which time he used his machine every night with percent used days greater than 4 hours at 73%, indicating adequate compliance with an average usage of only 4 hours and 21 minutes. Residual AHI 2 per  hour, leak on the acceptable side with the 95th percentile at 13.4 L/m on a pressure of 13/9 with a rate of 10.   12/28/2014, at which time he reported doing fairly well, no recent falls, tremor somewhat worse and dexterity worse. He had noted some trouble with his turns at times. He was on Sinemet 4 times a day, usually at 6 AM, 10 AM, 2 PM and 8 PM. He had constipation and was on a stool softener and prunes. He was to have a colonoscopy on 01/04/2015.    I reviewed his BiPAP ST compliance data from 05/29/2015 through 06/27/2015, which is a total of 30 days during which time he used his machine every night with percent used days greater than 4 hours at 97%, indicating excellent compliance with an average usage of 5 hours and 16 minutes, residual AHI 1.5 per hour, leak acceptable with the 95th percentile at 12.4 L/m on a pressure of 13/9 cm with a rate of 10.   I saw him on 07/09/2014, at whicht time he reported feeling stable for the most part. He had not experienced any drastic changes in his symptoms. He reported no falls. He had no significant low back pain and no longer was taking any narcotics. Unfortunately, his wife was involved in a car accident and sustained broken ribs and had to be in the ICU for some time. He was using his BiPAP machine regularly. He reported one skip night because of a stomach bug. Overall, he felt he was sleeping well. He felt that the increase in Sinemet helped his trembling. He was tolerating his medication. He had no new major mood or memory issues.    I reviewed his BiPAP compliance data from 11/23/2014 through 12/22/2014 which is a total of 30 days during which time he used his machine every night with percent used days greater than 4 hours at 93%, indicating excellent compliance with an average usage of 4 hours and 54 minutes, residual AHI low at 1.7 per hour, leaked low with the 95th percentile at 7.4 L/m on a pressure of 13/9 cm with a backup rate of 10.    I saw him  on 03/03/2014, at which time he reported experiencing thick mucus first thing in the morning. His tremor was worse per wife. He received new supplies recently. He has some thick mucus first thing in the morning. His tremor has become worse per wife. His back surgery in November 2015 had relieved much of his pain. He was no longer on pain medication. He was taking stool softeners for his constipation. I suggested he continue with Mirapex 3 times a day but I did ask him to increase Sinemet to 4 times a day.   I reviewed his BiPAP compliance data from 06/07/2014 through 07/06/2014 which is a total of 30 days during which time he used his machine 29 days with percent used days greater than 4 hours at 87%, indicating very good compliance with  an average usage of 5 hours and 2 minutes, residual AHI low at 1.6 per hour, leak low with the 95th percentile at 7.6 L/m on a pressure of 13/9 with a rate of 10.   I saw him on 12/02/2013, at which time he was compliant with BiPAP ST. He had spine surgery under Dr. Cyndy Freeze on 12/04/2013 which went well. He is in physical therapy. He had an L spine MRI on 11/06/13: Degenerative lumbar spondylosis with multilevel disc disease and facet disease. There is bilateral lateral recess and bilateral foraminal stenosis at L2-3 and L3-4. The most significant level however is L4-5 with there is severe spinal, bilateral lateral recess and foraminal stenosis. We talked about his fluid intake. He was still not consistent with his dose timings for his Sinemet and Mirapex.   I reviewed his compliance data from 01/27/2014 through 02/25/2014 which is a total of 30 days during which time he used his machine every night with percent used days greater than 4 hours of 87%, indicating very good compliance with an average usage of 5 hours and 6 minutes, residual AHI of 3.5 per hour and leak acceptable with the 95th percentile at 15 L/m.   I saw him on 05/27/2013, at which time he reported being  compliant with his BiPAP, averaging 4-5 hours each night. He was still taking his Sinemet with his meals on most days. He felt his tremor was a little worse. His wife felt that he was otherwise stable. He denied any new cognitive issues, depression, hallucinations, anxiety, delusions. He was drinking 3 cups of coffee in the morning and not enough water. I did not increase his medication but asked him to take his Sinemet away from his mealtimes. I considered Linzess for chronic constipation but asked him to increase his water intake and watch constipation symptoms before we use medications.   I reviewed his compliance data from 10/31/2013 through 11/29/2013 which is a total of 30 days during which time he used his machine every night except for 1 night. Percent used days greater than 4 hours was 90% indicating excellent compliance. Residual AHI at 2.5 per hour, leaked low. Pressure at 13/9 with a rate of 10.   I saw him on 11/27/2012, at which time I did not change his medication regimen with the exception of the timing for his Sinemet and Mirapex: I advised him to take it at 7 AM, 11 AM and 4 PM. I also asked him to continue using his BiPAP regularly and take it with him on any vacation trips. He was congratulated on his compliance.   I saw him on 07/26/2012 after had his sleep study. He has been compliant on BiPAP therapy. I had increased his Sinemet to one pill 3 times a day. He was taking it with his meals and did not note any significant improvement in his symptoms, but, then again, he was taking it right after his meals. He was asking whether he should take his BiPAP machine with him to his planned trip to [place].     I first met him on 02/19/2012 after his baseline sleep study. His total AHI was 39.5 per hour based primarily on central apneas. His obstructive AHI was around 15 per hour. His oxyhemoglobin desaturation nadir was 85% and he spent 3 hours and 31 minutes below the saturation of 90% for  the night. I asked him to come back for a CPAP titration study, possibly BiPAP but he wanted to hold off until his  appointment in April as he was going to be out of town and he also wanted to bring his wife for discussion.   I then saw him back on 04/17/2012 and again went over his test results with him and his wife. He had been doing well from the PD standpoint. He agreed to come back for another sleep study with full night titration. His sleep titration study was on 05/14/2012 and I went over his test results with him and his wife in detail during our visit in July. Sleep efficiency was reduced at 71.5% with a latency to sleep of 2 minutes. Wake after sleep onset was highly elevated at 124 minutes with mild to moderate sleep fragmentation noted. He had increased percentage of REM sleep at 27.2%. He had a normal REM latency. There were no significant aortic leg movements. He was started on CPAP at a pressure of 5 cm and titrated up to 9 cm of water pressure but he had significant central apneas and therefore changed to BiPAP at 11/7 and then switch to ST mode for ongoing central events. His final pressure was 13/9 with a backup rate of 10 on which she had a residual AHI of 0 per hour and supine REM sleep achieved. Oxyhemoglobin desaturation nadir on the final pressure was 90%.   He was placed on BiPAP ST at 13/9 cm with a backup rate of 10. I reviewed his compliance from 06/26/2012 through 07/25/2012 (29 days), during which time he used it every day. His average usage was 5 hours and 6 minutes and percent used days greater than 4 hours was 96% indicating excellent compliance. His residual AHI was around 6 indicating fairly reasonable pressure settings. He reported tolerating the treatment and he changed from a FFM to a nasal mask. He denied depression, memory loss, lightheadedness, or hallucinations. I also reviewed more recent compliance data from 09/29/2012 through 10/28/2012 (30 days), during which time he  used his machine every day. His average usage was 5 hours and 23 minutes, his percent used days greater than 4 hours was 29 days, which is 97%, indicating excellent compliance. His residual AHI was 2.6 per hour indicating an appropriate treatment setting of 13/9 cm with a backup rate of 10 per minute.   His Past Medical History Is Significant For: Past Medical History:  Diagnosis Date  . Arthritis   . Cancer [facility])    h/o  skin  cancer  . Ex-smoker   . GERD (gastroesophageal reflux disease)   . Hypercholesteremia   . Hypertension   . Lumbar stenosis   . Parkinson's disease ([place])   . Sleep apnea    ?? sleep apnea....tested 2013 @ [facility]     . Sleep disorder     His Past Surgical History Is Significant For: Past Surgical History:  Procedure Laterality Date  . APPENDECTOMY    . BACK SURGERY  11/2013  . COLONOSCOPY  11/17/2008   Rourk: Sessile polyp in the cecum status post saline-assisted piecemeal polypectomy, resolution clipping and epinephrine injection therapy performed. Shallow sigmoid diverticula.. Tubular adenoma  . COLONOSCOPY  11/19/2008   Fields: Large amount of liquid blood with clots seen throughout the colon. Previous [place] resolution clip remained in place. Purple spot seen in the lateral aspect polypectomy site, 3 resolution clips placed here. 6 mm sessile ascending colon polyp seen and not manipulated.  . COLONOSCOPY  11/25/2008   Rourk: Blood-tinged colonic effluent, suspect recurrent post polypectomy bleeding status post placement of 3 clips on  the polypectomy site on top of the 3 clips that already existed, one had fallen off since last procedure. One small polyp distal to the cecum not manipulated  . COLONOSCOPY  12/06/2009   Rourk: Internal hemorrhoids, scattered left-sided diverticula, cecal polyps, one snared and subsequently clipped, 2 adjacent diminutive polyps ablated. tubular adenoma  . COLONOSCOPY N/A 01/21/2015   Dr. Gala Romney: 1.5 cm carpet-type  polyp just distal to the ileocecal valve removed piecemeal fashion and ablated. Small polyp in the rectum. Pathology-tubular adenomas  . COLONOSCOPY N/A 09/01/2015   Procedure: COLONOSCOPY;  Surgeon: Daneil Dolin, MD;  Location: [facility];  Service: Endoscopy;  Laterality: N/A;  845  . EYE SURGERY     bilateral  cataracts  . MENISCUS REPAIR     right knee  2000  . POLYPECTOMY  09/01/2015   Procedure: POLYPECTOMY;  Surgeon: Daneil Dolin, MD;  Location: [facility];  Service: Endoscopy;;  colon  . TONSILLECTOMY Bilateral     His Family History Is Significant For: Family History  Problem Relation Age of Onset  . Dementia Mother   . Cancer Brother   . Stroke Brother     His Social History Is Significant For: Social History   Socioeconomic History  . Marital status: Married    Spouse name: Clinical research associate  . Number of children: 4  . Years of education: 73  . Highest education level: None  Social Needs  . Financial resource strain: None  . Food insecurity - worry: None  . Food insecurity - inability: None  . Transportation needs - medical: None  . Transportation needs - non-medical: None  Occupational History  . Occupation: Retired   Tobacco Use  . Smoking status: Former Smoker    Packs/day: 1.00    Types: Cigarettes, Pipe    Start date: 01/22/1951    Last attempt to quit: 01/21/1989    Years since quitting: 28.0  . Smokeless tobacco: Never Used  Substance and Sexual Activity  . Alcohol use: No    Alcohol/week: 0.0 oz  . Drug use: No  . Sexual activity: None  Other Topics Concern  . None  Social History Narrative  . None    His Allergies Are:  No Known Allergies:   His Current Medications Are:  Outpatient Encounter Medications as of 02/19/2017  Medication Sig  . amLODipine (NORVASC) 5 MG tablet Take 5 mg by mouth daily.  . calcium gluconate 500 MG tablet Take 500 mg by mouth daily.  . carbidopa-levodopa (SINEMET IR) 25-100 MG tablet Take 1 pill 6 times a day: 6, 9,  12PM, 3PM, 6PM, and 9 PM  . glucosamine-chondroitin 500-400 MG tablet Take 1 tablet by mouth once.  . Multiple Vitamin (MULTIVITAMIN) tablet Take 1 tablet by mouth daily.  Marland Kitchen omeprazole (PRILOSEC OTC) 20 MG tablet Take 20 mg by mouth daily.  . pramipexole (MIRAPEX) 0.5 MG tablet Take 1 tablet (0.5 mg total) by mouth 3 (three) times daily.  . valsartan-hydrochlorothiazide (DIOVAN-HCT) 80-12.5 MG tablet   . vitamin C (ASCORBIC ACID) 500 MG tablet Take 500 mg by mouth daily.   No facility-administered encounter medications on file as of 02/19/2017.   :  Review of Systems:  Out of a complete 14 point review of systems, all are reviewed and negative with the exception of these symptoms as listed below: Review of Systems  Neurological:       Pt presents today to discuss his PD and osa. Pt says that he "hasn't really"  fallen lately.    Objective:  Neurological Exam  Physical Exam Physical Examination:   Vitals:   02/19/17 0820  BP: 131/71  Pulse: 96   General Examination: The patient is a very pleasant 84 y.o. male in no acute distress. He appears frail. Well groomed.  HEENT exam: He has a moderate degree of nuchal rigidity with a mild to moderate lower jaw and lip tremor, fairly constant. He has a moderately masked facies with decreased eye blink rate. Hearing is mildly impaired. He is status post cataract repairs bilaterally. He has prescription eyeglasses. Speech is moderately hypophonic, and he has minimal dysarthriaand minimal drooling is noted, slightly worse. Pupils are equal, round and reactive to light. On extraocular tracking he has mild saccadic breakdown.  Chest is clear to auscultation without wheezing or rhonchi noted.  Heart sounds are normal with a slight systolic heart murmur (3/6), no rubs or gallops noted, unchanged.  Abdomen is soft, nontender with normal bowel sounds appreciated.  There is no pitting edema in the distal lower extremities bilaterally.    No  joint deformities are noted.   Skin is warm and dry but he does have several old appearing bruises across the dorsi of his hands, stable, chronic hyperpigmentation/discoloration.  Neurologically: Mental status: The patient is awake, alert and oriented in all 3 spheres. His memory, attention, language and knowledge are appropriate. Cranial nerves are as described under HEENT exam. In addition airway exam reveals a moderately tight airway secondary to a narrow airway entry. Tongue protrudes centrally and palate elevates symmetrically. Motor exam: Normal bulk and strength is noted. Tone is increased with some cogwheeling noted in both upper extremities, L more than R. Tone is also increased in the left lower extremity. Tone is mildly increased on the right side. He has a moderate constant resting tremor in the left upper extremity. He has an intermittent mild resting tremor on the right upper extremity. Fine motor skills with finger taps and hand movements as well as rapid alternating patting are moderately to severely impaired on the left and moderately so on the right. Foot taps are moderately impaired on the right and moderately to severely so on the left. He stands up from the seated position with mild problems and his posture is moderately stooped. He walks with decreased arm swing bilaterally, L more than right. He has a fairly good stride length but decreased pace and turns in 3 steps. His balance is fairly well preserved, but he did have mild insecurity with turning. Reflexes are 1+ in the UEs and absent in the LEs. Sensory exam is unchanged from before. Cerebellar testing shows no dysmetria or intention tremor on finger to nose testing.   Assessment and Plan:   In summary, [name] is a very pleasant 84 year old male with an underlying medical history of hypertension, hyperlipidemia, history of cancer, ex-smoker, and complex sleep apnea on BiPAP ST, who presents for followup consultation  off his left-sided predominant Parkinson's disease as well as his complex sleep apnea, for which he is consistent with his BiPAP treatment but does not always get more than 4 hours of sleep on it. He has a tendency to fall asleep in his recliner and then go to bed for the rest of the night later. His PD symptoms date back to about 10 years ago, per wife's estimate. His history is complicated by chronic constipation, severe mixed sleep apnea which is treated well with BiPAP ST, chronic low back pain for which he  had surgery on 12/04/2013 with good success. He is tolerating the Sinemet which is currently at 1 pill 6 times a day. We increased this in December 2017 from 1 pill 5 times a day. He is taking the medication about every 3 hours starting around 6 AM. His lower extremity swelling has improved. I would like for him to continue Mirapex generic 0.5 mg 3 times a day. I suggested we cautiously increase his Sinemet to 1-1/2 pills alternating with one pill for a total of 7-1/2 pills daily. I provided written instructions had a new prescription for 90 days for this. He is cautioned about his driving. I'm not sure he is completely safe to drive at this point. His daughter is in town from [place] and I would like for her to monitor his driving while she is here. In addition, he may need more help at the house, she reports that he is having more and more difficulty with his ADLs. He and his wife have long-term care insurance and I would be happy to fill out and the necessary forms in that regard. Unfortunately, his wife is currently in the hospital. I would like to see him back in 6 months, sooner as needed. I answered all their questions today and the patient and his daughter were in agreement.  I spent 25 minutes in total face-to-face time with the patient, more than 50% of which was spent in counseling and coordination of care, reviewing test results, reviewing medication and discussing or reviewing the diagnosis  of PD and sleep apnea, its prognosis and treatment options. Pertinent laboratory and imaging test results that were available during this visit with the patient were reviewed by me and considered in my medical decision making (see chart for details).

## 2017-02-19 NOTE — Patient Instructions (Addendum)
Please continue to use your BiPAP.  We will continue with your Mirapex generic 0.5 mg 3 times a day.  We will increase your Sinemet to 1 1/2 pills alternating with 1 pill for a total of 7 1/2 pills daily: Take 1 1/2 at 6, 1 at 9, 1 1/2 at 12PM, 1 at 3PM, 1 1/2 pills at [place], and 1 at 9 PM.  I am worried about your driving, please have your daughter/family monitor it and I would suggest at this point only local roads, familiar routes, no nighttime and no highway driving, but not sure, if you are safe to drive at this point. Please be really proactive with your constipation medication regimen, titrating as needed to where you have a formed stool at least every other day.

## 2017-04-10 IMAGING — US US ABDOMEN COMPLETE
1 series · 13 of 25 positions shown · non-contrast
Comparison: Lumbar spine MRI of November 06, 2013

CLINICAL DATA: Follow-up of abdominal aortic aneurysm; history of
GERD

EXAM:
ULTRASOUND ABDOMEN COMPLETE

[Series 1: us abdomen complete · 0.17mm/px · 13 of 125 slices shown]
[im 1/125]
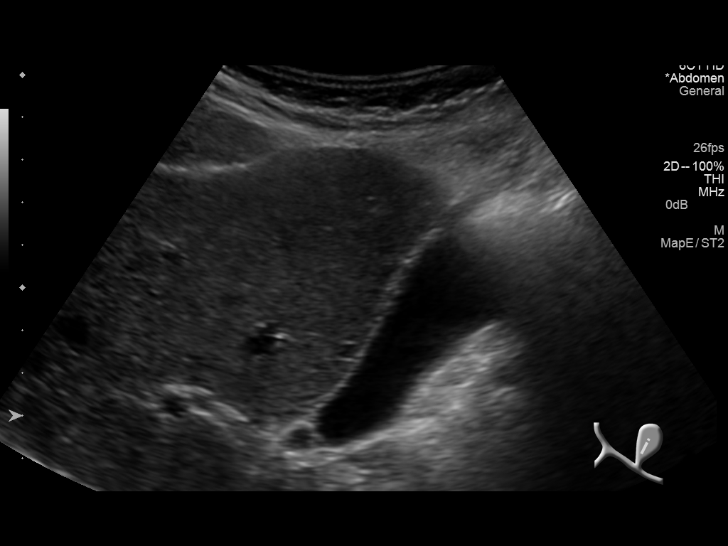
[im 11/125]
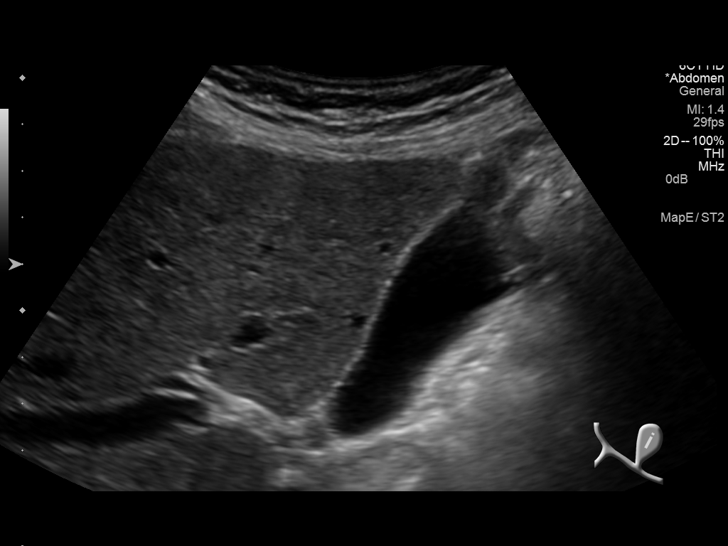
[im 21/125]
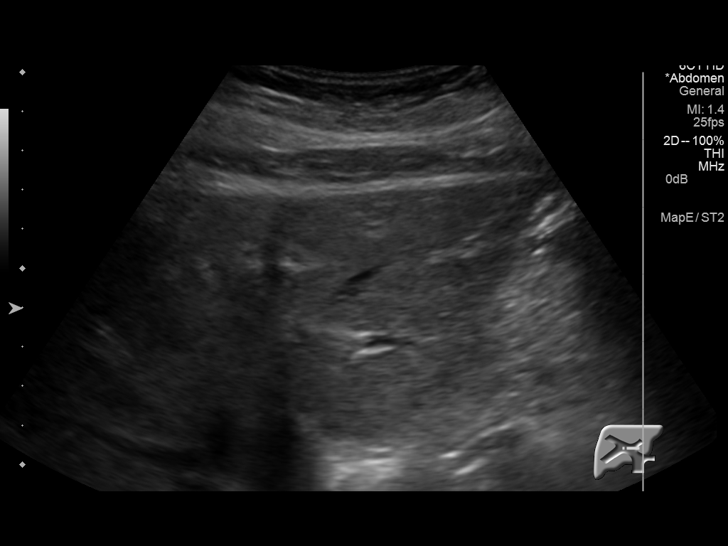
[im 32/125]
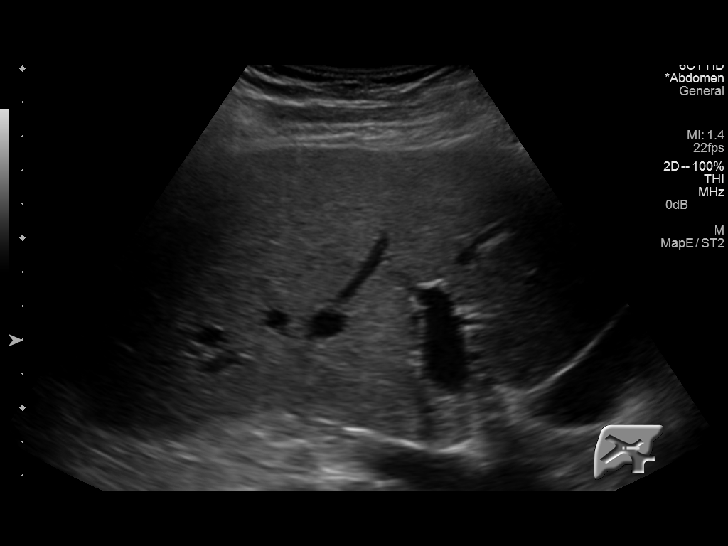
[im 42/125]
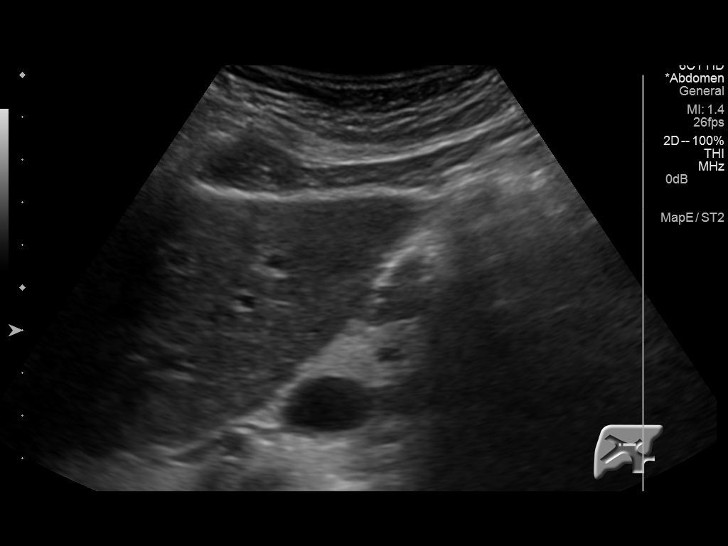
[im 52/125]
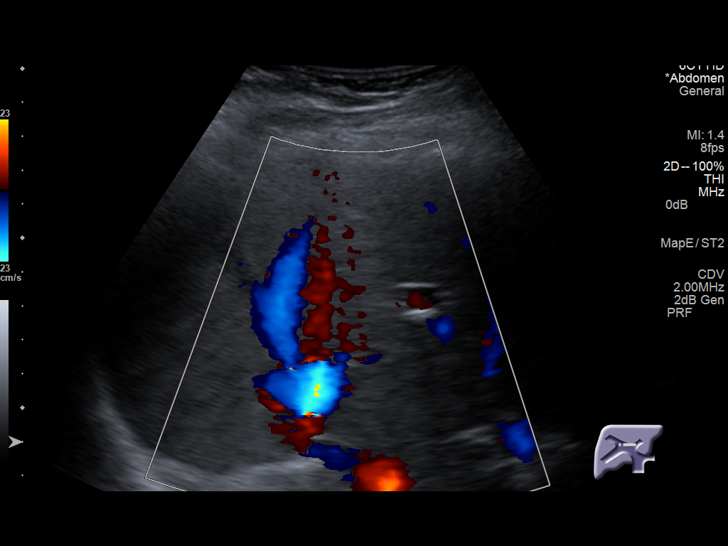
[im 63/125]
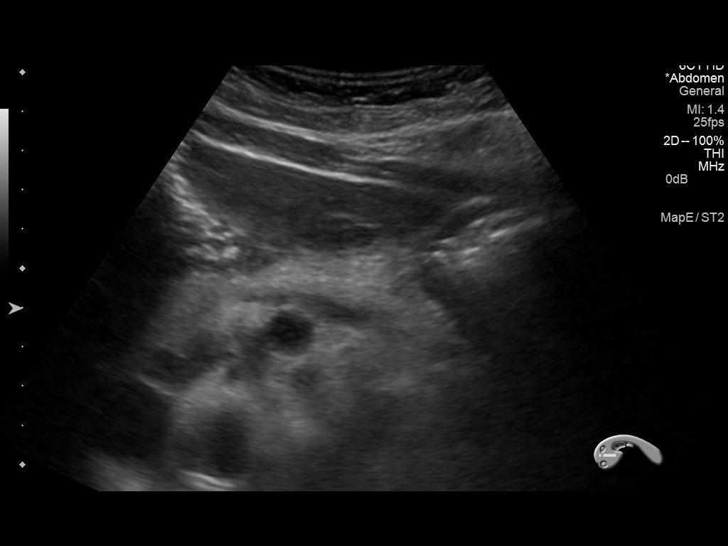
[im 73/125]
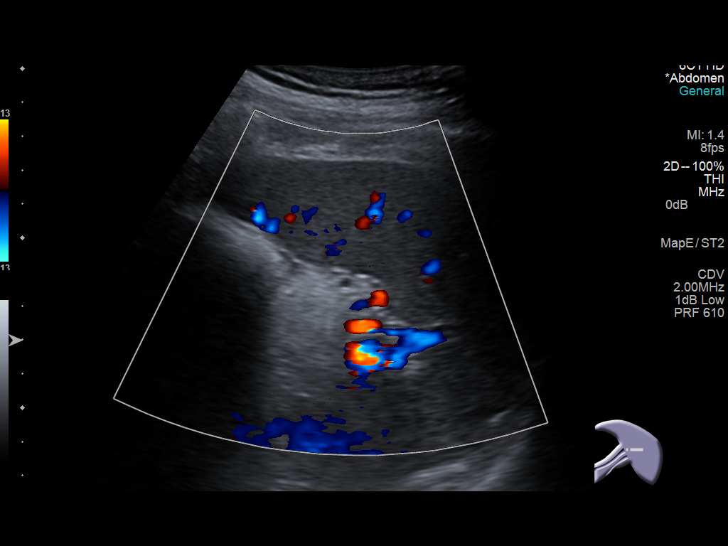
[im 83/125]
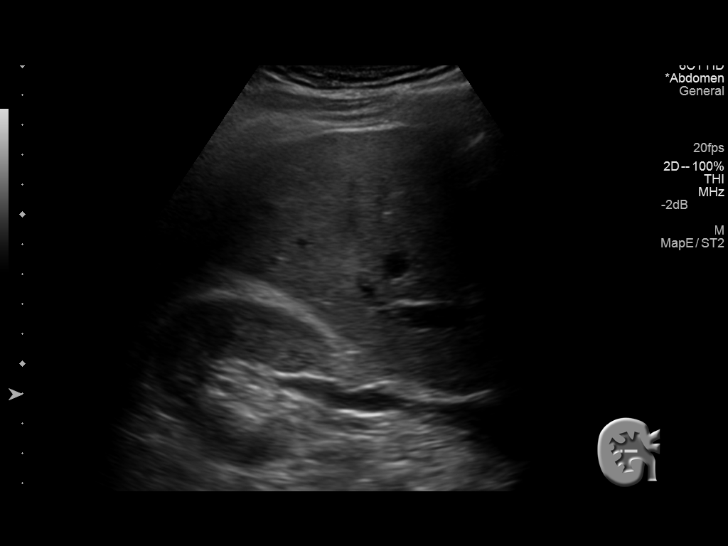
[im 94/125]
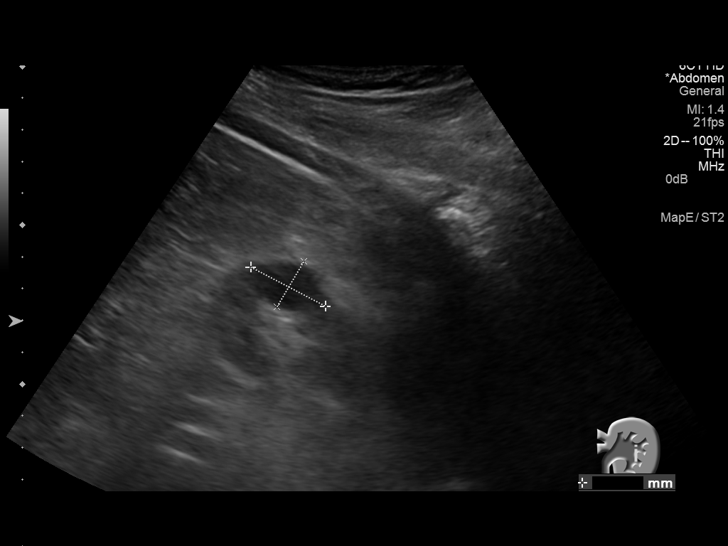
[im 104/125]
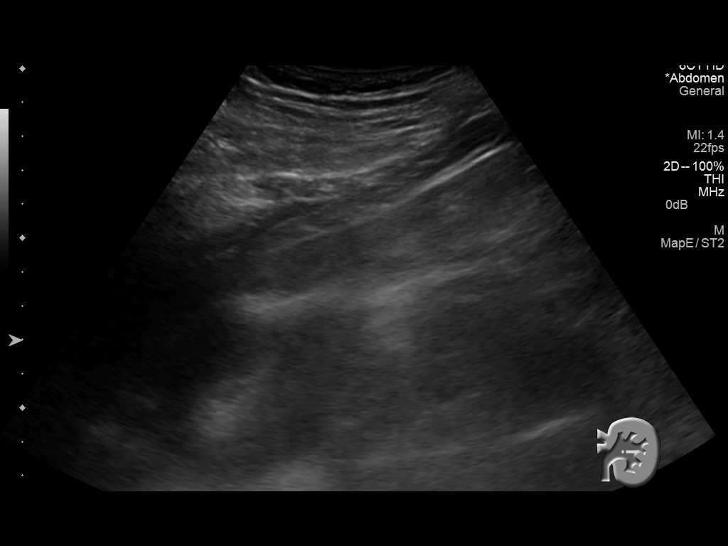
[im 114/125]
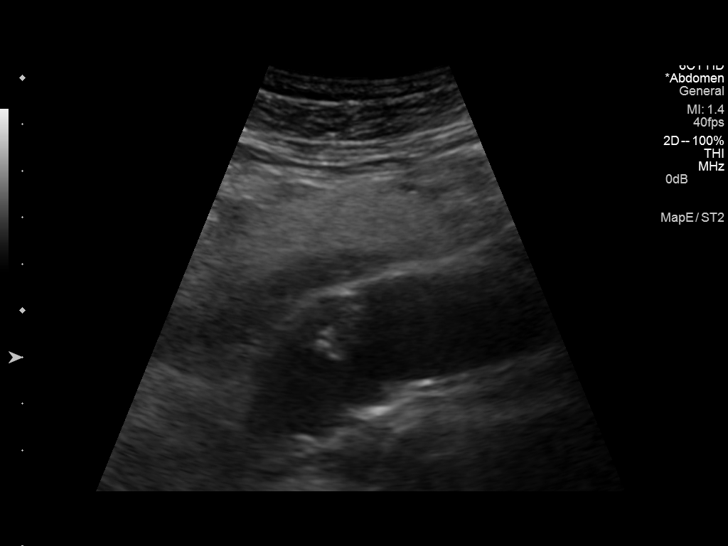
[im 125/125]
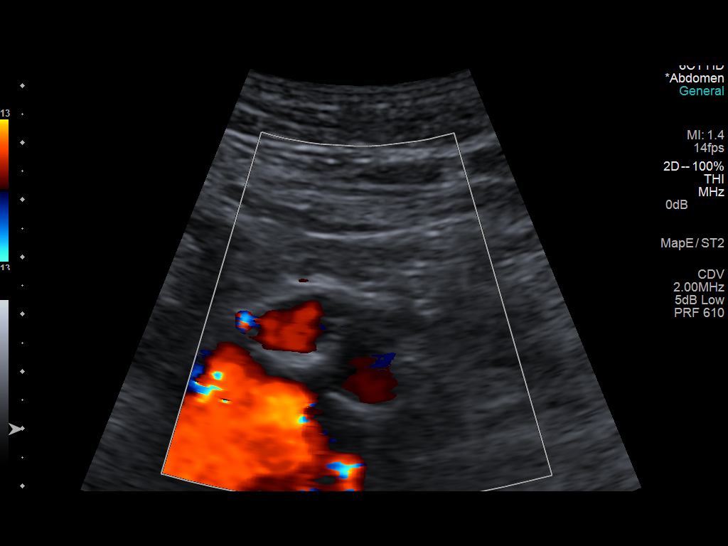

[13 of 25 positions shown; findings below may reference images not displayed]

FINDINGS: Gallbladder: No gallstones or wall thickening visualized. No
sonographic Murphy sign noted.

Common bile duct: Diameter: 3 mm

Liver: No focal lesion identified. Within normal limits in
parenchymal echogenicity.

IVC: No abnormality visualized.

Pancreas: The pancreas exhibits normal echotexture. The pancreatic
duct however is mildly dilated to as much as 6 mm.

Spleen: Size and appearance within normal limits.

Right Kidney: Length: 11 cm. Echogenicity within normal limits. No
mass or hydronephrosis visualized.

Left Kidney: Length: 10.9 cm.. In the midpole there is a 2 cm
diameter hypoechoic focus which likely reflects a non simple cyst.

Abdominal aorta: The abdominal aorta it demonstrates aneurysmal
dilation. Proximally the maximal diameters approximately 3 cm. In
the midportion the maximal dimension is 3.7 cm in the transverse
plane. The distal aorta measures approximately 2.2 cm transversely.

Other findings: There is no ascites.
IMPRESSION: 1. There is a mid abdominal aortic aneurysm measuring 3.7 cm in the
transverse plane. This is similar in appearance to that seen on the
MRI dated November 06, 2013.
2. Non simple cystic structure in the midpole of the left kidney 2
cm maximally. Further evaluation with renal protocol MRI is
recommended.
3. Pancreatic ductal dilation the pancreas was partially included in
the field of view on a CT scan of the chest dated March 27, 2011 and
minimal prominence of the pancreatic duct was demonstrated. A
pancreatic protocol CT scan is recommended for more thorough
evaluation of the pancreas.

## 2017-04-23 DIAGNOSIS — E782 Mixed hyperlipidemia: Secondary | ICD-10-CM | POA: Diagnosis not present

## 2017-04-23 DIAGNOSIS — M1991 Primary osteoarthritis, unspecified site: Secondary | ICD-10-CM | POA: Diagnosis not present

## 2017-04-23 DIAGNOSIS — R944 Abnormal results of kidney function studies: Secondary | ICD-10-CM | POA: Diagnosis not present

## 2017-04-23 DIAGNOSIS — M5440 Lumbago with sciatica, unspecified side: Secondary | ICD-10-CM | POA: Diagnosis not present

## 2017-04-23 DIAGNOSIS — I714 Abdominal aortic aneurysm, without rupture: Secondary | ICD-10-CM | POA: Diagnosis not present

## 2017-04-23 DIAGNOSIS — G20 Parkinson's disease: Secondary | ICD-10-CM | POA: Diagnosis not present

## 2017-04-23 DIAGNOSIS — D696 Thrombocytopenia, unspecified: Secondary | ICD-10-CM | POA: Diagnosis not present

## 2017-04-23 DIAGNOSIS — K219 Gastro-esophageal reflux disease without esophagitis: Secondary | ICD-10-CM | POA: Diagnosis not present

## 2017-04-23 DIAGNOSIS — R7301 Impaired fasting glucose: Secondary | ICD-10-CM | POA: Diagnosis not present

## 2017-04-23 DIAGNOSIS — I10 Essential (primary) hypertension: Secondary | ICD-10-CM | POA: Diagnosis not present

## 2017-04-23 DIAGNOSIS — M6281 Muscle weakness (generalized): Secondary | ICD-10-CM | POA: Diagnosis not present

## 2017-04-26 DIAGNOSIS — R944 Abnormal results of kidney function studies: Secondary | ICD-10-CM | POA: Diagnosis not present

## 2017-04-26 DIAGNOSIS — K219 Gastro-esophageal reflux disease without esophagitis: Secondary | ICD-10-CM | POA: Diagnosis not present

## 2017-04-26 DIAGNOSIS — R7301 Impaired fasting glucose: Secondary | ICD-10-CM | POA: Diagnosis not present

## 2017-04-26 DIAGNOSIS — E782 Mixed hyperlipidemia: Secondary | ICD-10-CM | POA: Diagnosis not present

## 2017-04-26 DIAGNOSIS — I714 Abdominal aortic aneurysm, without rupture: Secondary | ICD-10-CM | POA: Diagnosis not present

## 2017-04-26 DIAGNOSIS — I10 Essential (primary) hypertension: Secondary | ICD-10-CM | POA: Diagnosis not present

## 2017-04-26 DIAGNOSIS — Z6827 Body mass index (BMI) 27.0-27.9, adult: Secondary | ICD-10-CM | POA: Diagnosis not present

## 2017-04-26 DIAGNOSIS — D696 Thrombocytopenia, unspecified: Secondary | ICD-10-CM | POA: Diagnosis not present

## 2017-04-26 DIAGNOSIS — G20 Parkinson's disease: Secondary | ICD-10-CM | POA: Diagnosis not present

## 2017-05-18 DIAGNOSIS — L812 Freckles: Secondary | ICD-10-CM | POA: Diagnosis not present

## 2017-05-18 DIAGNOSIS — D1801 Hemangioma of skin and subcutaneous tissue: Secondary | ICD-10-CM | POA: Diagnosis not present

## 2017-05-18 DIAGNOSIS — D692 Other nonthrombocytopenic purpura: Secondary | ICD-10-CM | POA: Diagnosis not present

## 2017-05-18 DIAGNOSIS — L821 Other seborrheic keratosis: Secondary | ICD-10-CM | POA: Diagnosis not present

## 2017-05-18 DIAGNOSIS — L570 Actinic keratosis: Secondary | ICD-10-CM | POA: Diagnosis not present

## 2017-05-18 DIAGNOSIS — Z85828 Personal history of other malignant neoplasm of skin: Secondary | ICD-10-CM | POA: Diagnosis not present

## 2017-06-01 DIAGNOSIS — H43811 Vitreous degeneration, right eye: Secondary | ICD-10-CM | POA: Diagnosis not present

## 2017-06-01 DIAGNOSIS — H40053 Ocular hypertension, bilateral: Secondary | ICD-10-CM | POA: Diagnosis not present

## 2017-07-27 ENCOUNTER — Other Ambulatory Visit: Payer: Self-pay | Admitting: Neurology

## 2017-08-16 ENCOUNTER — Telehealth: Payer: Self-pay

## 2017-08-16 NOTE — Telephone Encounter (Signed)
I called pt to remind him to bring his bipap to his appt on Monday with Dr. Rexene Alberts. No answer, left a message asking him to call me back. If pt calls back, please remind him of this.

## 2017-08-19 ENCOUNTER — Encounter: Payer: Self-pay | Admitting: Neurology

## 2017-08-20 ENCOUNTER — Ambulatory Visit (INDEPENDENT_AMBULATORY_CARE_PROVIDER_SITE_OTHER): Payer: Medicare Other | Admitting: Neurology

## 2017-08-20 ENCOUNTER — Other Ambulatory Visit: Payer: Self-pay

## 2017-08-20 VITALS — BP 114/68 | HR 86 | Wt 192.5 lb

## 2017-08-20 DIAGNOSIS — G4731 Primary central sleep apnea: Secondary | ICD-10-CM

## 2017-08-20 DIAGNOSIS — G20 Parkinson's disease: Secondary | ICD-10-CM

## 2017-08-20 DIAGNOSIS — G4733 Obstructive sleep apnea (adult) (pediatric): Secondary | ICD-10-CM

## 2017-08-20 MED ORDER — PRAMIPEXOLE DIHYDROCHLORIDE 0.5 MG PO TABS: 0.5000 mg | ORAL_TABLET | Freq: Three times a day (TID) | ORAL | 3 refills | Status: DC

## 2017-08-20 MED ORDER — CARBIDOPA-LEVODOPA 25-100 MG PO TABS: ORAL_TABLET | ORAL | 3 refills | Status: DC

## 2017-08-20 NOTE — Progress Notes (Signed)
Subjective:    Patient ID: [name] is a 84 y.o. male.  HPI     Interim history:   [name] is a very pleasant 84 year old right-handed gentleman with an underlying medical history of hypertension, hyperlipidemia, history of cancer, ex-smoker, and complex sleep apnea on BiPAP ST, who presents for followup consultation off his left-sided predominant Parkinson's disease as well as his primary central sleep apnea, and OSA on BiPAP ST. He is accompanied by his wife again today. I last saw him on 02/19/2017, at which time he was not fully compliant with BiPAP. Overall, his Parkinson symptoms have progressed. His wife had been hospitalized. [name] was in town. I suggested we increase his Sinemet to 1-1/2 pills alternating with one pill for a total of 7-1/2 pills daily from previously 6 pills daily. He was encouraged to continue with his BiPAP as best as possible. We talked about the importance of hydration and constipation management.  Today, 08/20/2017: I reviewed his BiPAP compliance data from 07/21/2017 through 08/19/2017 which is a total of 30 days, during which time he used his machine every night with percent used days greater than 4 hours at 40%, indicating suboptimal compliance with an average usage of 3 hours and 48 minutes, residual AHI at goal at 1.8 per hour, leak acceptable with the 95th percentile at 13.8 L/m on a pressure of 13/9 cm with a backup rate of 10/m. He reports feeling fairly stable, recent falls, thankfully. Wife has been struggling with CHF, has been in and out of the hospital unfortunately. Thankfully, they are both stable currently. He has picked up his BiPAP usage quite a bit compared to 6 months ago for which he is commended. He tries to exercise in the form of walking and using a stationary bike. He has occasional constipation, does not have to take anything on a daily basis. He enjoys eating fruit in the morning. He tries to hydrate well.  Breaking Sinemet and half is difficult for him, he is wondering if we can increase his dosage to 2 pills alternating with one.   The patient's allergies, current medications, family history, past medical history, past social history, past surgical history and problem list were reviewed and updated as appropriate.    Previously (copied from previous notes for reference):    I saw him on 08/17/2016, at which time he was suboptimal with his BiPAP compliance. He felt that his tremor was worse. He had no recent falls, constipation was also under control. He was not always exercising on a regular basis. His wife was concerned about his daytime somnolence. His wife felt that his driving was still okay, he limited his driving to daylight driving in familiar routes. I asked him to continue with Sinemet 1 pill 6 times a day and Mirapex 0.5 mg 3 times a day. I asked him to monitor his driving and to be consistent with his BiPAP usage.   I reviewed his BiPAP ST compliance data from 01/20/2017 through 02/24/2017, during which time he used his BiPAP machine every night but percent used days greater than 4 hours was only 17%, indicating suboptimal compliance with an average usage of 3 hours and 18 minutes. Residual AHI at goal at 2.1 per hour, leak acceptable with the 95th percentile at 17.9 L/m on a pressure of 13/9 with a backup rate of 10.    I saw him on 12/28/15, and which time he was doing overall fairly well. He was adequate with his  BiPAP compliance. He had noted an increase in his tremor on the left side and also increase in stiffness. No recent falls for reported. His primary care reduced his amlodipine, he had more drooling. He had more nasal discharge, usually clear and very drippy at times. I asked him to increase his Sinemet to 1 pill 6 times a day. I suggested he continue with Mirapex at the same dose and will monitor his leg swelling. He was reminded to increase his water intake.   I reviewed his BiPAP  ST compliance data from 07/18/2016 through 08/16/2016, which is a total of 30 days, during which time he used his BiPAP 29 days but percent used days greater than 4 hours was only 47%, indicating suboptimal compliance, average AHI 2 per hour, leak acceptable with the 95th percentile at 14.3 L/m, average usage of 4 hours. Pressure of 13/9 cm with a set rate of 10.    I saw him on 06/28/2015, at which time he reported doing okay, tremor perhaps worse. He noticed no significant difference after we increase the Sinemet. Constipation was under control with MiraLAX. He was not exercising very much but does enjoy working in the yard. He had no recent falls. He was not using a cane or walker. He was compliant with his BiPAP ST. I suggested we keep his medications the same but monitor his leg swelling since he was on Mirapex.    I reviewed his BiPAP compliance data from 11/28/2015 through 12/27/2015, which is a total of 30 days, during which time he used his machine every night with percent used days greater than 4 hours at 73%, indicating adequate compliance with an average usage of only 4 hours and 21 minutes. Residual AHI 2 per hour, leak on the acceptable side with the 95th percentile at 13.4 L/m on a pressure of 13/9 with a rate of 10.   12/28/2014, at which time he reported doing fairly well, no recent falls, tremor somewhat worse and dexterity worse. He had noted some trouble with his turns at times. He was on Sinemet 4 times a day, usually at 6 AM, 10 AM, 2 PM and 8 PM. He had constipation and was on a stool softener and prunes. He was to have a colonoscopy on 01/04/2015.    I reviewed his BiPAP ST compliance data from 05/29/2015 through 06/27/2015, which is a total of 30 days during which time he used his machine every night with percent used days greater than 4 hours at 97%, indicating excellent compliance with an average usage of 5 hours and 16 minutes, residual AHI 1.5 per hour, leak acceptable with the  95th percentile at 12.4 L/m on a pressure of 13/9 cm with a rate of 10.   I saw him on 07/09/2014, at whicht time he reported feeling stable for the most part. He had not experienced any drastic changes in his symptoms. He reported no falls. He had no significant low back pain and no longer was taking any narcotics. Unfortunately, his wife was involved in a car accident and sustained broken ribs and had to be in the ICU for some time. He was using his BiPAP machine regularly. He reported one skip night because of a stomach bug. Overall, he felt he was sleeping well. He felt that the increase in Sinemet helped his trembling. He was tolerating his medication. He had no new major mood or memory issues.    I reviewed his BiPAP compliance data from 11/23/2014 through 12/22/2014 which  is a total of 30 days during which time he used his machine every night with percent used days greater than 4 hours at 93%, indicating excellent compliance with an average usage of 4 hours and 54 minutes, residual AHI low at 1.7 per hour, leaked low with the 95th percentile at 7.4 L/m on a pressure of 13/9 cm with a backup rate of 10.    I saw him on 03/03/2014, at which time he reported experiencing thick mucus first thing in the morning. His tremor was worse per wife. He received new supplies recently. He has some thick mucus first thing in the morning. His tremor has become worse per wife. His back surgery in November 2015 had relieved much of his pain. He was no longer on pain medication. He was taking stool softeners for his constipation. I suggested he continue with Mirapex 3 times a day but I did ask him to increase Sinemet to 4 times a day.   I reviewed his BiPAP compliance data from 06/07/2014 through 07/06/2014 which is a total of 30 days during which time he used his machine 29 days with percent used days greater than 4 hours at 87%, indicating very good compliance with an average usage of 5 hours and 2 minutes, residual  AHI low at 1.6 per hour, leak low with the 95th percentile at 7.6 L/m on a pressure of 13/9 with a rate of 10.   I saw him on 12/02/2013, at which time he was compliant with BiPAP ST. He had spine surgery under Dr. Cyndy Freeze on 12/04/2013 which went well. He is in physical therapy. He had an L spine MRI on 11/06/13: Degenerative lumbar spondylosis with multilevel disc disease and facet disease. There is bilateral lateral recess and bilateral foraminal stenosis at L2-3 and L3-4. The most significant level however is L4-5 with there is severe spinal, bilateral lateral recess and foraminal stenosis. We talked about his fluid intake. He was still not consistent with his dose timings for his Sinemet and Mirapex.   I reviewed his compliance data from 01/27/2014 through 02/25/2014 which is a total of 30 days during which time he used his machine every night with percent used days greater than 4 hours of 87%, indicating very good compliance with an average usage of 5 hours and 6 minutes, residual AHI of 3.5 per hour and leak acceptable with the 95th percentile at 15 L/m.   I saw him on 05/27/2013, at which time he reported being compliant with his BiPAP, averaging 4-5 hours each night. He was still taking his Sinemet with his meals on most days. He felt his tremor was a little worse. His wife felt that he was otherwise stable. He denied any new cognitive issues, depression, hallucinations, anxiety, delusions. He was drinking 3 cups of coffee in the morning and not enough water. I did not increase his medication but asked him to take his Sinemet away from his mealtimes. I considered Linzess for chronic constipation but asked him to increase his water intake and watch constipation symptoms before we use medications.   I reviewed his compliance data from 10/31/2013 through 11/29/2013 which is a total of 30 days during which time he used his machine every night except for 1 night. Percent used days greater than 4 hours  was 90% indicating excellent compliance. Residual AHI at 2.5 per hour, leaked low. Pressure at 13/9 with a rate of 10.   I saw him on 11/27/2012, at which time I did not change  his medication regimen with the exception of the timing for his Sinemet and Mirapex: I advised him to take it at 7 AM, 11 AM and 4 PM. I also asked him to continue using his BiPAP regularly and take it with him on any vacation trips. He was congratulated on his compliance.   I saw him on 07/26/2012 after had his sleep study. He has been compliant on BiPAP therapy. I had increased his Sinemet to one pill 3 times a day. He was taking it with his meals and did not note any significant improvement in his symptoms, but, then again, he was taking it right after his meals. He was asking whether he should take his BiPAP machine with him to his planned trip to [place].     I first met him on 02/19/2012 after his baseline sleep study. His total AHI was 39.5 per hour based primarily on central apneas. His obstructive AHI was around 15 per hour. His oxyhemoglobin desaturation nadir was 85% and he spent 3 hours and 31 minutes below the saturation of 90% for the night. I asked him to come back for a CPAP titration study, possibly BiPAP but he wanted to hold off until his appointment in April as he was going to be out of town and he also wanted to bring his wife for discussion.   I then saw him back on 04/17/2012 and again went over his test results with him and his wife. He had been doing well from the PD standpoint. He agreed to come back for another sleep study with full night titration. His sleep titration study was on 05/14/2012 and I went over his test results with him and his wife in detail during our visit in July. Sleep efficiency was reduced at 71.5% with a latency to sleep of 2 minutes. Wake after sleep onset was highly elevated at 124 minutes with mild to moderate sleep fragmentation noted. He had increased percentage of REM sleep at  27.2%. He had a normal REM latency. There were no significant aortic leg movements. He was started on CPAP at a pressure of 5 cm and titrated up to 9 cm of water pressure but he had significant central apneas and therefore changed to BiPAP at 11/7 and then switch to ST mode for ongoing central events. His final pressure was 13/9 with a backup rate of 10 on which she had a residual AHI of 0 per hour and supine REM sleep achieved. Oxyhemoglobin desaturation nadir on the final pressure was 90%.   He was placed on BiPAP ST at 13/9 cm with a backup rate of 10. I reviewed his compliance from 06/26/2012 through 07/25/2012 (29 days), during which time he used it every day. His average usage was 5 hours and 6 minutes and percent used days greater than 4 hours was 96% indicating excellent compliance. His residual AHI was around 6 indicating fairly reasonable pressure settings. He reported tolerating the treatment and he changed from a FFM to a nasal mask. He denied depression, memory loss, lightheadedness, or hallucinations. I also reviewed more recent compliance data from 09/29/2012 through 10/28/2012 (30 days), during which time he used his machine every day. His average usage was 5 hours and 23 minutes, his percent used days greater than 4 hours was 29 days, which is 97%, indicating excellent compliance. His residual AHI was 2.6 per hour indicating an appropriate treatment setting of 13/9 cm with a backup rate of 10 per minute.   His Past Medical History  Is Significant For: Past Medical History:  Diagnosis Date  . Arthritis   . Cancer [facility])    h/o  skin  cancer  . Ex-smoker   . GERD (gastroesophageal reflux disease)   . Hypercholesteremia   . Hypertension   . Lumbar stenosis   . Parkinson's disease ([place])   . Sleep apnea    ?? sleep apnea....tested 2013 @ [facility]     . Sleep disorder     His Past Surgical History Is Significant For: Past Surgical History:  Procedure Laterality Date  .  APPENDECTOMY    . BACK SURGERY  11/2013  . COLONOSCOPY  11/17/2008   Rourk: Sessile polyp in the cecum status post saline-assisted piecemeal polypectomy, resolution clipping and epinephrine injection therapy performed. Shallow sigmoid diverticula.. Tubular adenoma  . COLONOSCOPY  11/19/2008   Fields: Large amount of liquid blood with clots seen throughout the colon. Previous [place] resolution clip remained in place. Purple spot seen in the lateral aspect polypectomy site, 3 resolution clips placed here. 6 mm sessile ascending colon polyp seen and not manipulated.  . COLONOSCOPY  11/25/2008   Rourk: Blood-tinged colonic effluent, suspect recurrent post polypectomy bleeding status post placement of 3 clips on the polypectomy site on top of the 3 clips that already existed, one had fallen off since last procedure. One small polyp distal to the cecum not manipulated  . COLONOSCOPY  12/06/2009   Rourk: Internal hemorrhoids, scattered left-sided diverticula, cecal polyps, one snared and subsequently clipped, 2 adjacent diminutive polyps ablated. tubular adenoma  . COLONOSCOPY N/A 01/21/2015   Dr. Gala Romney: 1.5 cm carpet-type polyp just distal to the ileocecal valve removed piecemeal fashion and ablated. Small polyp in the rectum. Pathology-tubular adenomas  . COLONOSCOPY N/A 09/01/2015   Procedure: COLONOSCOPY;  Surgeon: Daneil Dolin, MD;  Location: [facility];  Service: Endoscopy;  Laterality: N/A;  845  . EYE SURGERY     bilateral  cataracts  . MENISCUS REPAIR     right knee  2000  . POLYPECTOMY  09/01/2015   Procedure: POLYPECTOMY;  Surgeon: Daneil Dolin, MD;  Location: [facility];  Service: Endoscopy;;  colon  . TONSILLECTOMY Bilateral     His Family History Is Significant For: Family History  Problem Relation Age of Onset  . Dementia Mother   . Cancer Brother   . Stroke Brother     His Social History Is Significant For: Social History   Socioeconomic History  . Marital status:  Married    Spouse name: Clinical research associate  . Number of children: 4  . Years of education: 31  . Highest education level: Not on file  Occupational History  . Occupation: Retired   Scientific laboratory technician  . Financial resource strain: Not on file  . Food insecurity:    Worry: Not on file    Inability: Not on file  . Transportation needs:    Medical: Not on file    Non-medical: Not on file  Tobacco Use  . Smoking status: Former Smoker    Packs/day: 1.00    Types: Cigarettes, Pipe    Start date: 01/22/1951    Last attempt to quit: 01/21/1989    Years since quitting: 28.5  . Smokeless tobacco: Never Used  Substance and Sexual Activity  . Alcohol use: No    Alcohol/week: 0.0 oz  . Drug use: No  . Sexual activity: Not on file  Lifestyle  . Physical activity:    Days per week: Not on file  Minutes per session: Not on file  . Stress: Not on file  Relationships  . Social connections:    Talks on phone: Not on file    Gets together: Not on file    Attends religious service: Not on file    Active member of club or organization: Not on file    Attends meetings of clubs or organizations: Not on file    Relationship status: Not on file  Other Topics Concern  . Not on file  Social History Narrative  . Not on file    His Allergies Are:  No Known Allergies:   His Current Medications Are:  Outpatient Encounter Medications as of 08/20/2017  Medication Sig  . amLODipine (NORVASC) 5 MG tablet Take 5 mg by mouth daily.  . calcium gluconate 500 MG tablet Take 500 mg by mouth daily.  . carbidopa-levodopa (SINEMET IR) 25-100 MG tablet Take 1 1/2 at 6, 1 at 9, 1 1/2 at 12PM, 1 at 3PM, 1 1/2 pills at 6PM, and 1 at 9 PM  . glucosamine-chondroitin 500-400 MG tablet Take 1 tablet by mouth once.  . Multiple Vitamin (MULTIVITAMIN) tablet Take 1 tablet by mouth daily.  Marland Kitchen omeprazole (PRILOSEC OTC) 20 MG tablet Take 20 mg by mouth daily.  . pramipexole (MIRAPEX) 0.5 MG tablet Take 1 tablet (0.5 mg total) by mouth 3  (three) times daily.  . valsartan-hydrochlorothiazide (DIOVAN-HCT) 80-12.5 MG tablet   . vitamin C (ASCORBIC ACID) 500 MG tablet Take 500 mg by mouth daily.  . [DISCONTINUED] pramipexole (MIRAPEX) 0.5 MG tablet TAKE 1 TABLET BY MOUTH 3  TIMES DAILY  . [DISCONTINUED] pramipexole (MIRAPEX) 0.5 MG tablet TAKE 1 TABLET BY MOUTH 3  TIMES DAILY   No facility-administered encounter medications on file as of 08/20/2017.   :  Review of Systems:  Out of a complete 14 point review of systems, all are reviewed and negative with the exception of these symptoms as listed below: Review of Systems  Neurological:       Patient reports that he has been doing well and denies any recent falls.     Objective:  Neurological Exam  Physical Exam Physical Examination:   Vitals:   08/20/17 0809  BP: 114/68  Pulse: 86    General Examination: The patient is a very pleasant 84 y.o. male in no acute distress. He appears well-developed and well-nourished and well groomed.   HEENT exam: He has a moderate degree of nuchal rigidity with a mildto moderatelower jaw and lip tremor, fairly constant. He has a moderately masked facies with decreased eye blink rate. Hearing is mildly impaired. He is status post cataract repairs bilaterally. He has prescription eyeglasses. Speech is moderately hypophonic, with minimal dysarthria noted, minimal drooling, stable. Pupils are reactive to light, tracking is difficult for him.  Chest is clear to auscultation without wheezing or rhonchi noted.  Heart sounds are normal with a stable appearing systolic murmur.  Abdomen is soft, nontender with normal bowel sounds appreciated.  There is no trace edema in the distal lower extremities bilaterally.   No joint deformities are noted.   Skin is warm and dry but he does have several old appearing bruises across the dorsi of his hands, worse than before. Chronic hyperpigmentation/discoloration.  Neurologically: Mental  status: The patient is awake, alert and oriented in all 3 spheres. His memory, attention, language and knowledge are appropriate. Cranial nerves are as described under HEENT exam. In addition airway exam reveals a moderately tight airway secondary  to a narrow airway entry. Tongue protrudes centrally and palate elevates symmetrically.   Motor exam: Normal bulk and strength is noted. Tone is increased with some cogwheeling noted in both upper extremities, L more than R. Tone is also increased in the left lower extremity. Tone is mildly increased on the right side. He has a moderate constant resting tremor in the left upper extremity. He has an intermittent mild resting tremor on the right upper extremity.  intermittent resting tremor in the left lower extremity as well. Fine motor skills with finger taps and hand movements as well as rapid alternating pattingaremoderately to severely impaired on the left and moderately so on the right. Foot taps are moderately impaired on the right and moderately to severely so on the left. He stands up from the seated position with mild problems and pushes himself up. Moderately stooped posture is noted. He walks with decreased arm swing bilaterally, L more than right. He has a decreased stride length and pace and turns in 3 steps. His balance is fairly well preserved, but he did have mild insecurity with turning.Cerebellar testing shows no dysmetria or intention tremor. Sensory exam is intact to light touch.    Assessment and Plan:   In summary, BARON PARMELEE is a very pleasant 84 year old male with an underlying medical history of hypertension, hyperlipidemia, history of cancer, ex-smoker, and complex sleep apnea on BiPAP ST, who presents for followup consultation off his left-sided predominant Parkinson's disease as well as his complex sleep apnea, for which he is on BiPAP ST. his compliance has improved in the past few months. He does use his machine consistently  but does not always keep it on. He is encouraged to continue to use his BiPAP regularly. As far as his Parkinson's disease, he was diagnosed about 10 years ago. He has had some intermittent issues with constipation. He has had back pain with status post back surgery on 12/04/2013 with good success. He is tolerating the Sinemet which is currently at 1-1/2 pills alternating with one pill for a total of 7-1/2 pills daily. we mutually agreed to try to increase his Sinemet to 2 pills alternating with one pill for total of 9 pills daily. He is cautioned about potential side effects including sleepiness, low blood pressure values, nausea, headaches. He is encouraged to drink plenty of water, 6-8 cups of possible. He is encouraged to exercise daily in the form of walking or using his stationary bike. He is reminded to be very proactive about constipation issues. I suggested a six-month follow-up routinely. I answered all their questions today and the patient and his wife were in agreement.  I spent 25 minutes in total face-to-face time with the patient, more than 50% of which was spent in counseling and coordination of care, reviewing test results, reviewing medication and discussing or reviewing the diagnosis of PD and CSA and OSA, the prognosis and treatment options. Pertinent laboratory and imaging test results that were available during this visit with the patient were reviewed by me and considered in my medical decision making (see chart for details).

## 2017-08-20 NOTE — Patient Instructions (Addendum)
Please be really proactive with your constipation medication regimen, titrating as needed to where you have a formed stool at least every other day.  Please exercise daily. Please drink water daily, 6-8 cups. Try to eat fresh fruit and vegetables.  As discussed, we are increasing your Sinemet to 2 pills at 6, 12 and 6 PM, you will continue with one pill at 9 AM, 3 PM and 9 PM. Follow up in 6 months.

## 2017-09-18 DIAGNOSIS — C44629 Squamous cell carcinoma of skin of left upper limb, including shoulder: Secondary | ICD-10-CM | POA: Diagnosis not present

## 2017-09-18 DIAGNOSIS — D692 Other nonthrombocytopenic purpura: Secondary | ICD-10-CM | POA: Diagnosis not present

## 2017-09-18 DIAGNOSIS — L570 Actinic keratosis: Secondary | ICD-10-CM | POA: Diagnosis not present

## 2017-09-18 DIAGNOSIS — D1801 Hemangioma of skin and subcutaneous tissue: Secondary | ICD-10-CM | POA: Diagnosis not present

## 2017-09-18 DIAGNOSIS — L821 Other seborrheic keratosis: Secondary | ICD-10-CM | POA: Diagnosis not present

## 2017-09-18 DIAGNOSIS — D3612 Benign neoplasm of peripheral nerves and autonomic nervous system, upper limb, including shoulder: Secondary | ICD-10-CM | POA: Diagnosis not present

## 2017-09-18 DIAGNOSIS — Z85828 Personal history of other malignant neoplasm of skin: Secondary | ICD-10-CM | POA: Diagnosis not present

## 2017-09-18 DIAGNOSIS — D485 Neoplasm of uncertain behavior of skin: Secondary | ICD-10-CM | POA: Diagnosis not present

## 2017-09-18 DIAGNOSIS — L812 Freckles: Secondary | ICD-10-CM | POA: Diagnosis not present

## 2017-10-24 DIAGNOSIS — Z23 Encounter for immunization: Secondary | ICD-10-CM | POA: Diagnosis not present

## 2017-10-26 DIAGNOSIS — I10 Essential (primary) hypertension: Secondary | ICD-10-CM | POA: Diagnosis not present

## 2017-10-26 DIAGNOSIS — R944 Abnormal results of kidney function studies: Secondary | ICD-10-CM | POA: Diagnosis not present

## 2017-10-26 DIAGNOSIS — E782 Mixed hyperlipidemia: Secondary | ICD-10-CM | POA: Diagnosis not present

## 2017-10-26 DIAGNOSIS — Z125 Encounter for screening for malignant neoplasm of prostate: Secondary | ICD-10-CM | POA: Diagnosis not present

## 2017-10-26 DIAGNOSIS — R7301 Impaired fasting glucose: Secondary | ICD-10-CM | POA: Diagnosis not present

## 2017-10-30 DIAGNOSIS — I714 Abdominal aortic aneurysm, without rupture: Secondary | ICD-10-CM | POA: Diagnosis not present

## 2017-10-30 DIAGNOSIS — E782 Mixed hyperlipidemia: Secondary | ICD-10-CM | POA: Diagnosis not present

## 2017-10-30 DIAGNOSIS — R7301 Impaired fasting glucose: Secondary | ICD-10-CM | POA: Diagnosis not present

## 2017-10-30 DIAGNOSIS — G20 Parkinson's disease: Secondary | ICD-10-CM | POA: Diagnosis not present

## 2017-10-30 DIAGNOSIS — R944 Abnormal results of kidney function studies: Secondary | ICD-10-CM | POA: Diagnosis not present

## 2017-10-30 DIAGNOSIS — R350 Frequency of micturition: Secondary | ICD-10-CM | POA: Diagnosis not present

## 2017-10-30 DIAGNOSIS — G4733 Obstructive sleep apnea (adult) (pediatric): Secondary | ICD-10-CM | POA: Diagnosis not present

## 2017-10-30 DIAGNOSIS — Z0000 Encounter for general adult medical examination without abnormal findings: Secondary | ICD-10-CM | POA: Diagnosis not present

## 2017-10-30 DIAGNOSIS — K219 Gastro-esophageal reflux disease without esophagitis: Secondary | ICD-10-CM | POA: Diagnosis not present

## 2017-10-30 DIAGNOSIS — D696 Thrombocytopenia, unspecified: Secondary | ICD-10-CM | POA: Diagnosis not present

## 2017-10-30 DIAGNOSIS — I10 Essential (primary) hypertension: Secondary | ICD-10-CM | POA: Diagnosis not present

## 2017-10-30 DIAGNOSIS — R32 Unspecified urinary incontinence: Secondary | ICD-10-CM | POA: Diagnosis not present

## 2017-11-22 DIAGNOSIS — D492 Neoplasm of unspecified behavior of bone, soft tissue, and skin: Secondary | ICD-10-CM | POA: Diagnosis not present

## 2017-11-22 DIAGNOSIS — Z85828 Personal history of other malignant neoplasm of skin: Secondary | ICD-10-CM | POA: Diagnosis not present

## 2017-11-22 DIAGNOSIS — D485 Neoplasm of uncertain behavior of skin: Secondary | ICD-10-CM | POA: Diagnosis not present

## 2017-11-22 DIAGNOSIS — C4442 Squamous cell carcinoma of skin of scalp and neck: Secondary | ICD-10-CM | POA: Diagnosis not present

## 2017-12-03 DIAGNOSIS — H43811 Vitreous degeneration, right eye: Secondary | ICD-10-CM | POA: Diagnosis not present

## 2017-12-03 DIAGNOSIS — H40053 Ocular hypertension, bilateral: Secondary | ICD-10-CM | POA: Diagnosis not present

## 2017-12-11 DIAGNOSIS — C801 Malignant (primary) neoplasm, unspecified: Secondary | ICD-10-CM | POA: Diagnosis not present

## 2017-12-11 DIAGNOSIS — Z85828 Personal history of other malignant neoplasm of skin: Secondary | ICD-10-CM | POA: Diagnosis not present

## 2017-12-11 DIAGNOSIS — C4442 Squamous cell carcinoma of skin of scalp and neck: Secondary | ICD-10-CM | POA: Diagnosis not present

## 2018-01-21 DIAGNOSIS — D692 Other nonthrombocytopenic purpura: Secondary | ICD-10-CM | POA: Diagnosis not present

## 2018-01-21 DIAGNOSIS — D1801 Hemangioma of skin and subcutaneous tissue: Secondary | ICD-10-CM | POA: Diagnosis not present

## 2018-01-21 DIAGNOSIS — L821 Other seborrheic keratosis: Secondary | ICD-10-CM | POA: Diagnosis not present

## 2018-01-21 DIAGNOSIS — L570 Actinic keratosis: Secondary | ICD-10-CM | POA: Diagnosis not present

## 2018-01-21 DIAGNOSIS — Z85828 Personal history of other malignant neoplasm of skin: Secondary | ICD-10-CM | POA: Diagnosis not present

## 2018-01-24 ENCOUNTER — Ambulatory Visit (HOSPITAL_COMMUNITY)
Admission: RE | Admit: 2018-01-24 | Discharge: 2018-01-24 | Disposition: A | Discharge status: Home / Self Care | Payer: Medicare Other | Source: Ambulatory Visit | Attending: Internal Medicine | Admitting: Internal Medicine

## 2018-01-24 DIAGNOSIS — I714 Abdominal aortic aneurysm, without rupture, unspecified: Secondary | ICD-10-CM

## 2018-01-28 DIAGNOSIS — H40053 Ocular hypertension, bilateral: Secondary | ICD-10-CM | POA: Diagnosis not present

## 2018-01-31 ENCOUNTER — Encounter: Payer: Self-pay | Admitting: Cardiovascular Disease

## 2018-01-31 ENCOUNTER — Ambulatory Visit (INDEPENDENT_AMBULATORY_CARE_PROVIDER_SITE_OTHER): Payer: Medicare Other | Admitting: Cardiovascular Disease

## 2018-01-31 VITALS — BP 150/84 | HR 95 | Ht 70.0 in | Wt 204.0 lb

## 2018-01-31 DIAGNOSIS — I10 Essential (primary) hypertension: Secondary | ICD-10-CM | POA: Diagnosis not present

## 2018-01-31 DIAGNOSIS — I714 Abdominal aortic aneurysm, without rupture, unspecified: Secondary | ICD-10-CM

## 2018-01-31 DIAGNOSIS — G20 Parkinson's disease: Secondary | ICD-10-CM

## 2018-01-31 DIAGNOSIS — I350 Nonrheumatic aortic (valve) stenosis: Secondary | ICD-10-CM | POA: Diagnosis not present

## 2018-01-31 NOTE — Patient Instructions (Signed)
Medication Instructions:  Your physician recommends that you continue on your current medications as directed. Please refer to the Current Medication list given to you today.   Labwork: NONE  Testing/Procedures: Your physician has requested that you have an echocardiogram. Echocardiography is a painless test that uses sound waves to create images of your heart. It provides your doctor with information about the size and shape of your heart and how well your heart's chambers and valves are working. This procedure takes approximately one hour. There are no restrictions for this procedure.  You have been referred to [facility]. THEY WILL CONTACT YOU SOON.    Follow-Up: Your physician wants you to follow-up in: 6 MONTHS.  You will receive a reminder letter in the mail two months in advance. If you don't receive a letter, please call our office to schedule the follow-up appointment.   Any Other Special Instructions Will Be Listed Below (If Applicable).     If you need a refill on your cardiac medications before your next appointment, please call your pharmacy.

## 2018-01-31 NOTE — Progress Notes (Signed)
SUBJECTIVE: The patient presents for annual follow-up of severe aortic stenosis and abdominal aortic aneurysm.  Echocardiogram on 02/16/2017 showed normal left ventricular systolic function and regional wall motion, LVEF 55 to 60%, moderate LVH, grade 1 diastolic dysfunction, severely calcified aortic leaflets with severe stenosis, and mild mitral regurgitation.  He denies exertional chest pain and tightness.  He has had no change in his baseline lightheadedness and dizziness from Parkinson's.  He denies syncope.  Chronic leg swelling has not gotten any worse.  He denies orthopnea and paroxysmal nocturnal dyspnea.  He may get slightly short of breath when carrying something heavy up a flight of stairs.  He did not take his antihypertensive medications this morning.  ECG performed in the office today which I ordered and personally interpreted demonstrates normal sinus rhythm with a ventricular couplet.    Review of Systems: As per "subjective", otherwise negative.  No Known Allergies  Current Outpatient Medications  Medication Sig Dispense Refill  . amLODipine (NORVASC) 5 MG tablet Take 5 mg by mouth daily.    . brimonidine (ALPHAGAN) 0.2 % ophthalmic solution INSTILL 1 DROP INTO EACH EYE THREE TIMES DAILY    . calcium gluconate 500 MG tablet Take 500 mg by mouth daily.    . carbidopa-levodopa (SINEMET IR) 25-100 MG tablet Take 2 at 6, 1 at 9, 2 at 12PM, 1 at 3PM, 2 pills at 6PM, and 1 at 9 PM 810 tablet 3  . dorzolamide-timolol (COSOPT) 22.3-6.8 MG/ML ophthalmic solution INSTILL 1 DROP INTO EACH EYE TWICE DAILY    . glucosamine-chondroitin 500-400 MG tablet Take 1 tablet by mouth once.    . latanoprost (XALATAN) 0.005 % ophthalmic solution INSTILL 1 DROP INTO EACH EYE AT BEDTIME    . losartan-hydrochlorothiazide (HYZAAR) 100-25 MG tablet     . Multiple Vitamin (MULTIVITAMIN) tablet Take 1 tablet by mouth daily.    Marland Kitchen omeprazole (PRILOSEC OTC) 20 MG tablet Take 20 mg by mouth daily.     . pramipexole (MIRAPEX) 0.5 MG tablet Take 1 tablet (0.5 mg total) by mouth 3 (three) times daily. 270 tablet 3  . vitamin C (ASCORBIC ACID) 500 MG tablet Take 500 mg by mouth daily.     No current facility-administered medications for this visit.     Past Medical History:  Diagnosis Date  . Arthritis   . Cancer [facility])    h/o  skin  cancer  . Ex-smoker   . GERD (gastroesophageal reflux disease)   . Hypercholesteremia   . Hypertension   . Lumbar stenosis   . Parkinson's disease ([place])   . Sleep apnea    ?? sleep apnea....tested 2013 @ [facility]     . Sleep disorder     Past Surgical History:  Procedure Laterality Date  . APPENDECTOMY    . BACK SURGERY  11/2013  . COLONOSCOPY  11/17/2008   Rourk: Sessile polyp in the cecum status post saline-assisted piecemeal polypectomy, resolution clipping and epinephrine injection therapy performed. Shallow sigmoid diverticula.. Tubular adenoma  . COLONOSCOPY  11/19/2008   Fields: Large amount of liquid blood with clots seen throughout the colon. Previous [place] resolution clip remained in place. Purple spot seen in the lateral aspect polypectomy site, 3 resolution clips placed here. 6 mm sessile ascending colon polyp seen and not manipulated.  . COLONOSCOPY  11/25/2008   Rourk: Blood-tinged colonic effluent, suspect recurrent post polypectomy bleeding status post placement of 3 clips on the polypectomy site on top of the 3  clips that already existed, one had fallen off since last procedure. One small polyp distal to the cecum not manipulated  . COLONOSCOPY  12/06/2009   Rourk: Internal hemorrhoids, scattered left-sided diverticula, cecal polyps, one snared and subsequently clipped, 2 adjacent diminutive polyps ablated. tubular adenoma  . COLONOSCOPY N/A 01/21/2015   Dr. Gala Romney: 1.5 cm carpet-type polyp just distal to the ileocecal valve removed piecemeal fashion and ablated. Small polyp in the rectum. Pathology-tubular adenomas  .  COLONOSCOPY N/A 09/01/2015   Procedure: COLONOSCOPY;  Surgeon: Daneil Dolin, MD;  Location: [facility];  Service: Endoscopy;  Laterality: N/A;  845  . EYE SURGERY     bilateral  cataracts  . MENISCUS REPAIR     right knee  2000  . POLYPECTOMY  09/01/2015   Procedure: POLYPECTOMY;  Surgeon: Daneil Dolin, MD;  Location: [facility];  Service: Endoscopy;;  colon  . TONSILLECTOMY Bilateral     Social History   Socioeconomic History  . Marital status: Married    Spouse name: Clinical research associate  . Number of children: 4  . Years of education: 61  . Highest education level: Not on file  Occupational History  . Occupation: Retired   Scientific laboratory technician  . Financial resource strain: Not on file  . Food insecurity:    Worry: Not on file    Inability: Not on file  . Transportation needs:    Medical: Not on file    Non-medical: Not on file  Tobacco Use  . Smoking status: Former Smoker    Packs/day: 1.00    Types: Cigarettes, Pipe    Start date: 01/22/1949    Last attempt to quit: 01/21/1989    Years since quitting: 29.0  . Smokeless tobacco: Never Used  Substance and Sexual Activity  . Alcohol use: No    Alcohol/week: 0.0 standard drinks  . Drug use: No  . Sexual activity: Not on file  Lifestyle  . Physical activity:    Days per week: Not on file    Minutes per session: Not on file  . Stress: Not on file  Relationships  . Social connections:    Talks on phone: Not on file    Gets together: Not on file    Attends religious service: Not on file    Active member of club or organization: Not on file    Attends meetings of clubs or organizations: Not on file    Relationship status: Not on file  . Intimate partner violence:    Fear of current or ex partner: Not on file    Emotionally abused: Not on file    Physically abused: Not on file    Forced sexual activity: Not on file  Other Topics Concern  . Not on file  Social History Narrative  . Not on file     Vitals:   01/31/18 0951    BP: (!) 150/84  Pulse: 95  SpO2: 95%  Weight: 204 lb (92.5 kg)  Height: 5\' 10"  (1.778 m)    Wt Readings from Last 3 Encounters:  01/31/18 204 lb (92.5 kg)  08/20/17 192 lb 8 oz (87.3 kg)  02/19/17 187 lb (84.8 kg)     PHYSICAL EXAM General: NAD HEENT: Normal. Neck: No JVD, no thyromegaly. Lungs: Clear to auscultation bilaterally with normal respiratory effort. CV: Regular rate and rhythm, normal S1/diminished S2, no S3/S4, harsh III/VI crescendo-decrescendo systolic murmur heard throughout the precordium.   Chronic bilateral leg edema with stasis dermatitis.  Abdomen: Soft, nontender, no distention.  Neurologic: Alert and oriented. Bilateral hand tremors. Psych: Normal affect. Skin: Bilateral lower extremity stasis dermatitis. Musculoskeletal: No gross deformities.    ECG: Reviewed above under Subjective   Labs: Lab Results  Component Value Date/Time   K 3.8 11/28/2013 09:32 AM   BUN 30 (H) 11/28/2013 09:32 AM   CREATININE 1.23 11/28/2013 09:32 AM   ALT 24 11/19/2008 02:25 PM   HGB 15.0 11/28/2013 09:32 AM     Lipids: No results found for: LDLCALC, LDLDIRECT, CHOL, TRIG, HDL     ASSESSMENT AND PLAN: 1.  Severe aortic stenosis: Symptomatically stable. I will obtain an echocardiogram to assess for interval changes.  It is difficult to assess symptoms as he does not get much physical activity due to Parkinson's disease.  I will make a referral to the structural heart team for eventual valve replacement, perhaps TAVR.  2. Essential RCV:ELFYBOFB today.  However, he did not take his medications today.  He is onamlodipine 5 mg and losartan-HCTZ 100-12.5 mg.    3. Hyperlipidemia: Not on statin therapy.  4. Sleep apnea: On BiPAP.  5. Orthostatic hypotension: No further episodes. He likely has a degree of autonomic dysfunction given his Parkinson's Disease.  6. Parkinson's disease: On Sinemet.  He is followed by neurology in [place] (Dr. Rexene Alberts).  7. AAA:   Ultrasound on 01/24/2018 demonstrated grossly unchanged findings measuring 3.7 cm in diameter, similar to the May 2016 examination.  A repeat ultrasound is recommended in 2 years.     Disposition: Follow up with structural heart team in the near future.  Follow-up with me in 6 months.   Kate Sable, M.D., F.A.C.C.

## 2018-02-06 ENCOUNTER — Ambulatory Visit (HOSPITAL_COMMUNITY)
Admission: RE | Admit: 2018-02-06 | Discharge: 2018-02-06 | Disposition: A | Discharge status: Home / Self Care | Payer: Medicare Other | Source: Ambulatory Visit | Attending: Cardiovascular Disease | Admitting: Cardiovascular Disease

## 2018-02-06 DIAGNOSIS — I350 Nonrheumatic aortic (valve) stenosis: Secondary | ICD-10-CM | POA: Diagnosis not present

## 2018-02-06 NOTE — Progress Notes (Signed)
*  PRELIMINARY RESULTS* Echocardiogram 2D Echocardiogram has been performed.  [name] 02/06/2018, 9:15 AM

## 2018-02-15 ENCOUNTER — Ambulatory Visit (INDEPENDENT_AMBULATORY_CARE_PROVIDER_SITE_OTHER): Payer: Medicare Other | Admitting: Cardiovascular Disease

## 2018-02-15 ENCOUNTER — Encounter: Payer: Self-pay | Admitting: Cardiovascular Disease

## 2018-02-15 VITALS — BP 150/98 | HR 100 | Ht 70.0 in | Wt 206.8 lb

## 2018-02-15 DIAGNOSIS — I350 Nonrheumatic aortic (valve) stenosis: Secondary | ICD-10-CM | POA: Diagnosis not present

## 2018-02-15 LAB — CBC WITH DIFFERENTIAL/PLATELET
Basophils Absolute: 0.1 10*3/uL (ref 0.0–0.2)
Basos: 1 %
EOS (ABSOLUTE): 0.1 10*3/uL (ref 0.0–0.4)
Eos: 1 %
HEMATOCRIT: 46.2 % (ref 37.5–51.0)
Hemoglobin: 16.2 g/dL (ref 13.0–17.7)
Immature Grans (Abs): 0 10*3/uL (ref 0.0–0.1)
Immature Granulocytes: 0 %
Lymphocytes Absolute: 2.1 10*3/uL (ref 0.7–3.1)
Lymphs: 22 %
MCH: 34.1 pg — ABNORMAL HIGH (ref 26.6–33.0)
MCHC: 35.1 g/dL (ref 31.5–35.7)
MCV: 97 fL (ref 79–97)
Monocytes Absolute: 0.7 10*3/uL (ref 0.1–0.9)
Monocytes: 7 %
Neutrophils Absolute: 6.7 10*3/uL (ref 1.4–7.0)
Neutrophils: 69 %
Platelets: 182 10*3/uL (ref 150–450)
RBC: 4.75 x10E6/uL (ref 4.14–5.80)
RDW: 12.4 % (ref 11.6–15.4)
WBC: 9.7 10*3/uL (ref 3.4–10.8)

## 2018-02-15 LAB — BASIC METABOLIC PANEL
BUN/Creatinine Ratio: 23 (ref 10–24)
BUN: 30 mg/dL — ABNORMAL HIGH (ref 8–27)
CO2: 23 mmol/L (ref 20–29)
CREATININE: 1.31 mg/dL — AB (ref 0.76–1.27)
Calcium: 9.7 mg/dL (ref 8.6–10.2)
Chloride: 100 mmol/L (ref 96–106)
GFR calc Af Amer: 57 mL/min/{1.73_m2} — ABNORMAL LOW (ref >59)
GFR calc non Af Amer: 50 mL/min/{1.73_m2} — ABNORMAL LOW (ref >59)
GLUCOSE: 101 mg/dL — AB (ref 65–99)
Potassium: 3.9 mmol/L (ref 3.5–5.2)
Sodium: 142 mmol/L (ref 134–144)

## 2018-02-15 NOTE — Patient Instructions (Addendum)
Medication Instructions:  Your provider recommends that you continue on your current medications as directed. Please refer to the Current Medication list given to you today.    Labwork: TODAY: BMET, CBC  Testing/Procedures: Your physician has requested that you have a cardiac catheterization. Cardiac catheterization is used to diagnose and/or treat various heart conditions. Doctors may recommend this procedure for a number of different reasons. The most common reason is to evaluate chest pain. Chest pain can be a symptom of coronary artery disease (CAD), and cardiac catheterization can show whether plaque is narrowing or blocking your heart's arteries. This procedure is also used to evaluate the valves, as well as measure the blood flow and oxygen levels in different parts of your heart. For further information please visit [email or web address]. Please follow instruction sheet, as given.  Follow-Up: The TAVR team will contact you to arrange further appointments!   HEART CATHETERIZATION INSTRUCTIONS:  You are scheduled for a Cardiac Catheterization on Tuesday, February 4 with Dr. [name].  1. Please arrive at the [facility] (Main Entrance A) at [facility]: [address] [place], [place] [address] at 7:00AM  (This time is two hours before your procedure to ensure your preparation). Free valet parking service is available.   Special note: Every effort is made to have your procedure done on time. Please understand that emergencies sometimes delay scheduled procedures.  2. Diet: Do not eat solid foods after midnight.  The patient may have clear liquids until 5am upon the day of the procedure.  3. Labs: TODAY.  4. Medication instructions in preparation for your procedure:  1) MAKE SURE TO TAKE YOUR ASPIRIN the morning of the procedure.  2) You may take all other medications as directed with sips of water  5. Plan for one night stay--bring personal belongings. 6. Bring a  current list of your medications and current insurance cards. 7. You MUST have a responsible person to drive you home. 8. Someone MUST be with you the first 24 hours after you arrive home or your discharge will be delayed. 9. Please wear clothes that are easy to get on and off and wear slip-on shoes.  Thank you for allowing [place] to care for you!   -- [facility]

## 2018-02-15 NOTE — Progress Notes (Signed)
Cardiology Office Note:    Date:  02/15/2018   ID:  [name], DOB August 09, 1931, MRN [id number]  PCP:  Celene Squibb, MD  Cardiologist:  Kate Sable, MD  Electrophysiologist:  None   Referring MD: Celene Squibb, MD   Chief Complaint  Patient presents with  . Shortness of Breath    History of Present Illness:    [name] is a 85 y.o. male presenting for evaluation of aortic stenosis, referred by Dr Bronson Ing.  The patient is here with his wife today. I remember them from his wife's recent hospitalization for heart failure.   He complains of shortness of breath with activity over the past year. He is short of breath with walking at a brisk pace or with walking up stairs. He denies chest pain, chest pressure, lightheadedness, or syncope.  The patient has limitation related to his Parkinson's disease as well.  He denies leg swelling, orthopnea, or PND.  He is never had any heart problems and specifically denies any history of heart catheterization, myocardial infarction, or congestive heart failure.  He has not been hospitalized with any heart problems.  He does have an infrarenal abdominal aortic aneurysm that has been followed with serial ultrasound studies.  This most recently measured a maximum diameter of 3.8 cm.  He has been told of a heart murmur for about 5 years.  They have some concerns about him undergoing a surgical procedure because at the time of his back surgery, he had a lot of problems with his Parkinson's disease during recovery.  His Parkinson's medications were not administered at his normal times.  Past Medical History:  Diagnosis Date  . Arthritis   . Cancer [facility])    h/o  skin  cancer  . Ex-smoker   . GERD (gastroesophageal reflux disease)   . Hypercholesteremia   . Hypertension   . Lumbar stenosis   . Parkinson's disease ([place])   . Sleep apnea    ?? sleep apnea....tested 2013 @ [facility]     . Sleep disorder     Past Surgical  History:  Procedure Laterality Date  . APPENDECTOMY    . BACK SURGERY  11/2013  . COLONOSCOPY  11/17/2008   Rourk: Sessile polyp in the cecum status post saline-assisted piecemeal polypectomy, resolution clipping and epinephrine injection therapy performed. Shallow sigmoid diverticula.. Tubular adenoma  . COLONOSCOPY  11/19/2008   Fields: Large amount of liquid blood with clots seen throughout the colon. Previous [place] resolution clip remained in place. Purple spot seen in the lateral aspect polypectomy site, 3 resolution clips placed here. 6 mm sessile ascending colon polyp seen and not manipulated.  . COLONOSCOPY  11/25/2008   Rourk: Blood-tinged colonic effluent, suspect recurrent post polypectomy bleeding status post placement of 3 clips on the polypectomy site on top of the 3 clips that already existed, one had fallen off since last procedure. One small polyp distal to the cecum not manipulated  . COLONOSCOPY  12/06/2009   Rourk: Internal hemorrhoids, scattered left-sided diverticula, cecal polyps, one snared and subsequently clipped, 2 adjacent diminutive polyps ablated. tubular adenoma  . COLONOSCOPY N/A 01/21/2015   Dr. Gala Romney: 1.5 cm carpet-type polyp just distal to the ileocecal valve removed piecemeal fashion and ablated. Small polyp in the rectum. Pathology-tubular adenomas  . COLONOSCOPY N/A 09/01/2015   Procedure: COLONOSCOPY;  Surgeon: Daneil Dolin, MD;  [facility];  Service: Endoscopy;  Laterality: N/A;  845  . EYE SURGERY  bilateral  cataracts  . MENISCUS REPAIR     right knee  2000  . POLYPECTOMY  09/01/2015   Procedure: POLYPECTOMY;  Surgeon: Daneil Dolin, MD;  Location: [facility];  Service: Endoscopy;;  colon  . TONSILLECTOMY Bilateral     Current Medications: Current Meds  Medication Sig  . amLODipine (NORVASC) 5 MG tablet Take 5 mg by mouth daily.  . brimonidine (ALPHAGAN) 0.2 % ophthalmic solution INSTILL 1 DROP INTO EACH EYE THREE TIMES DAILY    . calcium gluconate 500 MG tablet Take 500 mg by mouth daily.  . carbidopa-levodopa (SINEMET IR) 25-100 MG tablet Take 2 at 6, 1 at 9, 2 at 12PM, 1 at 3PM, 2 pills at 6PM, and 1 at 9 PM  . dorzolamide-timolol (COSOPT) 22.3-6.8 MG/ML ophthalmic solution INSTILL 1 DROP INTO EACH EYE TWICE DAILY  . glucosamine-chondroitin 500-400 MG tablet Take 1 tablet by mouth once.  . latanoprost (XALATAN) 0.005 % ophthalmic solution INSTILL 1 DROP INTO EACH EYE AT BEDTIME  . losartan-hydrochlorothiazide (HYZAAR) 100-25 MG tablet   . Multiple Vitamin (MULTIVITAMIN) tablet Take 1 tablet by mouth daily.  Marland Kitchen omeprazole (PRILOSEC OTC) 20 MG tablet Take 20 mg by mouth daily.  . pramipexole (MIRAPEX) 0.5 MG tablet Take 1 tablet (0.5 mg total) by mouth 3 (three) times daily.  . vitamin C (ASCORBIC ACID) 500 MG tablet Take 500 mg by mouth daily.     Allergies:   Patient has no known allergies.   Social History   Socioeconomic History  . Marital status: Married    Spouse name: Clinical research associate  . Number of children: 4  . Years of education: 65  . Highest education level: Not on file  Occupational History  . Occupation: Retired   Scientific laboratory technician  . Financial resource strain: Not on file  . Food insecurity:    Worry: Not on file    Inability: Not on file  . Transportation needs:    Medical: Not on file    Non-medical: Not on file  Tobacco Use  . Smoking status: Former Smoker    Packs/day: 1.00    Types: Cigarettes, Pipe    Start date: 01/22/1951    Last attempt to quit: 01/21/1989    Years since quitting: 29.0  . Smokeless tobacco: Never Used  Substance and Sexual Activity  . Alcohol use: No    Alcohol/week: 0.0 standard drinks  . Drug use: No  . Sexual activity: Not on file  Lifestyle  . Physical activity:    Days per week: Not on file    Minutes per session: Not on file  . Stress: Not on file  Relationships  . Social connections:    Talks on phone: Not on file    Gets together: Not on file    Attends  religious service: Not on file    Active member of club or organization: Not on file    Attends meetings of clubs or organizations: Not on file    Relationship status: Not on file  Other Topics Concern  . Not on file  Social History Narrative  . Not on file     Family History: The patient's family history includes Cancer in his brother; Dementia in his mother; Stroke in his brother.  ROS:   Please see the history of present illness.    Positive for shortness of breath with activity, back pain, easy bruising, snoring.  All other systems reviewed and are negative.  EKGs/Labs/Other Studies Reviewed:  The following studies were reviewed today: Abdominal aortic aneurysm: FINDINGS: Abdominal aortic measurements as follows:  Proximal:  2.4 x 1.9 cm  Mid: 3.7 x 3.6 cm, similar to the 05/2014 examination, previously, 3.7 x 3.5 cm  Distal:  2.3 x 1.9 cm  Right common iliac artery: 1.7 x 1.2 cm  Left Common iliac artery: 1.2 x 1.1 cm  There is a moderate amount eccentric mixed echogenic plaque/mural thrombus within the dominant component the mid abdominal aortic aneurysm (representative images 8 through 12).  IMPRESSION: 1. Grossly unchanged aneurysmal dilatation of the mid aspect the abdominal aorta measuring 3.7 cm in diameter, similar to the 05/2014 examination. Aortic aneurysm NOS (ICD10-I71.9). Recommend follow-up aortic ultrasound in 2 years. This recommendation follows ACR consensus guidelines: White Paper of the ACR Incidental Findings Committee II on Vascular Findings. J Am Coll Radiol 2013; 10:789-794. 2.  Aortic Atherosclerosis (ICD10-I70.0).  Echo: Study Conclusions  - Left ventricle: The cavity size was normal. Wall thickness was   increased in a pattern of severe LVH. Systolic function was   normal. The estimated ejection fraction was in the range of 60%   to 65%. Wall motion was normal; there were no regional wall   motion abnormalities. Doppler  parameters are consistent with   abnormal left ventricular relaxation (grade 1 diastolic   dysfunction). Doppler parameters are consistent with high   ventricular filling pressure. - Aortic valve: Severely calcified annulus. Trileaflet; severely   thickened leaflets. There was moderate to severe stenosis. Mean   gradient by [facility] probe is 38 mmHg. Valve area (VTI): 0.92   cm^2. Valve area (Vmax): 0.96 cm^2. Valve area (Vmean): 0.98   cm^2. - Mitral valve: Mildly calcified annulus. Mildly thickened leaflets   . - Left atrium: The atrium was mildly dilated.  ------------------------------------------------------------------- Study data:  Comparison was made to the study of 02/16/2017.  Study status:  Routine.  Procedure:  The patient reported no pain pre or post test. Transthoracic echocardiography. Image quality was adequate.  Study completion:  There were no complications. Transthoracic echocardiography.  M-mode, complete 2D, spectral Doppler, and color Doppler.  Birthdate:  Patient birthdate: [birth year].  Age:  Patient is 85 yr old.  Sex:  Gender: male. BMI: 29.3 kg/m^2.  Blood pressure:     178/91  Patient status: Inpatient.  Study date:  Study date: 02/06/2018. Study time: 08:34 AM.  Location:  Echo laboratory.  -------------------------------------------------------------------  ------------------------------------------------------------------- Left ventricle:  The cavity size was normal. Wall thickness was increased in a pattern of severe LVH. Systolic function was normal. The estimated ejection fraction was in the range of 60% to 65%. Wall motion was normal; there were no regional wall motion abnormalities. Doppler parameters are consistent with abnormal left ventricular relaxation (grade 1 diastolic dysfunction). Doppler parameters are consistent with high ventricular filling pressure.   ------------------------------------------------------------------- Aortic  valve:   Severely calcified annulus. Trileaflet; severely thickened leaflets. Mean gradient by [facility] probe is 38 mmHg. Doppler:   There was moderate to severe stenosis.   There was no significant regurgitation.    VTI ratio of LVOT to aortic valve: 0.2. Valve area (VTI): 0.92 cm^2. Indexed valve area (VTI): 0.42 cm^2/m^2. Peak velocity ratio of LVOT to aortic valve: 0.21. Valve area (Vmax): 0.96 cm^2. Indexed valve area (Vmax): 0.44 cm^2/m^2. Mean velocity ratio of LVOT to aortic valve: 0.22. Valve area (Vmean): 0.98 cm^2. Indexed valve area (Vmean): 0.45 cm^2/m^2. Mean gradient (S): 31 mm Hg. Peak gradient (S): 57 mm Hg.  ------------------------------------------------------------------- Aorta:  Aortic  root: The aortic root was mildly dilated.  ------------------------------------------------------------------- Mitral valve:   Mildly calcified annulus. Mildly thickened leaflets .  Doppler:   There was no evidence for stenosis.   There was trivial regurgitation.    Peak gradient (D): 2 mm Hg.  ------------------------------------------------------------------- Left atrium:  The atrium was mildly dilated.  ------------------------------------------------------------------- Atrial septum:  No defect or patent foramen ovale was identified.   ------------------------------------------------------------------- Right ventricle:  The cavity size was normal. Systolic function was normal.  ------------------------------------------------------------------- Pulmonic valve:   Not well visualized.  Doppler:   There was no evidence for stenosis.   There was no significant regurgitation.   ------------------------------------------------------------------- Tricuspid valve:   Normal thickness leaflets.  Doppler:   There was no evidence for stenosis.   There was no significant regurgitation.   ------------------------------------------------------------------- Pulmonary artery:     Systolic pressure could not be accurately estimated.   Inadequate TR jet.  ------------------------------------------------------------------- Right atrium:  The atrium was normal in size.  ------------------------------------------------------------------- Pericardium:  There was no pericardial effusion.  ------------------------------------------------------------------- Systemic veins:  IVC is dilated with normal variation, estimated RA pressure is 8 mmHg.  ------------------------------------------------------------------- Measurements   Left ventricle                            Value          Reference  LV ID, ED, PLAX chordal           (L)     41.4  mm       43 - 52  LV ID, ES, PLAX chordal                   28.3  mm       23 - 38  LV PW thickness, ED                       14.8  mm       ---------  IVS/LV PW ratio, ED                       1.03           <=1.3    Ventricular septum                        Value          Reference  IVS thickness, ED                         15.3  mm       ---------    LVOT                                      Value          Reference  LVOT ID, S                                24    mm       ---------  LVOT area                                 4.52  cm^2     ---------  LVOT peak velocity, S                     80.14 cm/s     ---------  LVOT mean velocity, S                     52.4  cm/s     ---------  LVOT VTI, S                               17.74 cm       ---------  Stroke volume (SV), LVOT DP               50.3  ml       ---------  Stroke index (SV/bsa), LVOT DP            23.3  ml/m^2   ---------    Aortic valve                              Value          Reference  Aortic valve peak velocity, S             379   cm/s     ---------  Aortic valve mean velocity, S             243   cm/s     ---------  Aortic valve VTI, S                       87.7  cm       ---------  Aortic mean gradient, S                   31    mm Hg    ---------   Aortic peak gradient, S                   57    mm Hg    ---------  VTI ratio, LVOT/AV                        0.2            ---------  Aortic valve area, VTI                    0.92  cm^2     ---------  Aortic valve area/bsa, VTI                0.42  cm^2/m^2 ---------  Velocity ratio, peak, LVOT/AV             0.21           ---------  Aortic valve area, peak velocity          0.96  cm^2     ---------  Aortic valve area/bsa, peak               0.44  cm^2/m^2 ---------  velocity  Velocity ratio, mean, LVOT/AV             0.22           ---------  Aortic valve area, mean velocity          0.98  cm^2     ---------  Aortic valve area/bsa, mean  0.45  cm^2/m^2 ---------  velocity    Aorta                                     Value          Reference  Aortic root ID, ED                        42    mm       ---------    Left atrium                               Value          Reference  LA ID, A-P, ES                            37    mm       ---------  LA volume/bsa, ES, 1-p A4C                31.6  ml/m^2   ---------    Mitral valve                              Value          Reference  Mitral E-wave peak velocity               70.8  cm/s     ---------  Mitral A-wave peak velocity               116   cm/s     ---------  Mitral deceleration time          (L)     134   ms       150 - 230  Mitral peak gradient, D                   2     mm Hg    ---------  Mitral E/A ratio, peak                    0.6            ---------    Right ventricle                           Value          Reference  TAPSE                                     23    mm       ---------  RV s&', lateral, S                         13.6  cm/s     ---------  EKG:  EKG is not ordered today.  The patient's most recent EKG is reviewed from January 31, 2018 and this demonstrates normal sinus rhythm with a ventricular couplet.  There is no intraventricular conduction delay or bundle branch block.  Recent Labs: No  results found for requested labs within last 8760 hours.  Recent Lipid Panel No results found for: CHOL, TRIG, HDL, CHOLHDL, VLDL, LDLCALC, LDLDIRECT  Physical Exam:    VS:  BP (!) 150/98   Pulse 100   Ht 5\' 10"  (1.778 m)   Wt 206 lb 12.8 oz (93.8 kg)   SpO2 96%   BMI 29.67 kg/m     Wt Readings from Last 3 Encounters:  02/15/18 206 lb 12.8 oz (93.8 kg)  01/31/18 204 lb (92.5 kg)  08/20/17 192 lb 8 oz (87.3 kg)     GEN: Well nourished, well developed elderly male in no acute distress HEENT: Normal, parkinsonian features. NECK: No JVD; bilateral carotid bruits LYMPHATICS: No lymphadenopathy CARDIAC: RRR, 3/6 harsh late peaking systolic murmur with diminished A2.  No diastolic murmur. RESPIRATORY:  Clear to auscultation without rales, wheezing or rhonchi  ABDOMEN: Soft, non-tender, non-distended MUSCULOSKELETAL:  No edema; No deformity  SKIN: Warm and dry NEUROLOGIC:  Alert and oriented x 3, resting tremor noted PSYCHIATRIC:  Normal affect   ASSESSMENT:    1. Severe aortic stenosis    PLAN:    In order of problems listed above:  The patient presents with symptoms of New York Heart Association functional class II chronic diastolic heart failure in the setting of stage D1 severe aortic stenosis.  I have reviewed the natural history of aortic stenosis with the patient and their family members who are present today. We have discussed the limitations of medical therapy and the poor prognosis associated with symptomatic aortic stenosis. We have reviewed potential treatment options, including palliative medical therapy, conventional surgical aortic valve replacement, and transcatheter aortic valve replacement. We discussed treatment options in the context of the patient's specific comorbid medical conditions.   I have personally reviewed the patient's echo images.  He has vigorous LV systolic function with LVEF 60 to 65%.  He has severe calcification and restriction of the aortic  valve with significant leaflet thickening.  On my review of his images, his peak transaortic velocity is 3.8 m/s with peak and mean gradients of 57 and 31 mmHg.  The aortic valve dimensionless index is 0.2 and the aortic valve area calculates to 0.5 cm.  The mean gradient by [facility] probe is 38 mmHg.  Based on his progressive symptoms over the past year and presence of severe aortic stenosis, I think aortic valve replacement is clearly indicated.  The patient would be at high risk of a prolonged postoperative recovery with conventional surgery in the context of his advanced age and Parkinson's disease.  TAVR would likely be a preferable treatment option for this particular patient.  We discussed this at length today.  We reviewed the fact that further preoperative assessment is necessary in order to determine anatomic feasibility for TAVR and better define treatment options.  I have recommended right and left heart catheterization with possible PCI as well as CT angiography studies of the heart and the chest/abdomen/pelvis.  CT will help determine the feasibility of transfemoral access and specifically risks that might be associated with his infrarenal abdominal aneurysm.  I have reviewed the risks, indications, and alternatives to cardiac catheterization, possible angioplasty, and stenting with the patient. Risks include but are not limited to bleeding, infection, vascular injury, stroke, myocardial infection, arrhythmia, kidney injury, radiation-related injury in the case of prolonged fluoroscopy use, emergency cardiac surgery, and death. The patient understands the risks of serious complication is 1-2 in 0932 with diagnostic cardiac cath and 1-2% or less with angioplasty/stenting.   After the patient's studies are completed,  he will be referred for formal cardiac surgical evaluation as part of a multidisciplinary approach to his treatment plan.   Medication Adjustments/Labs and Tests Ordered: Current  medicines are reviewed at length with the patient today.  Concerns regarding medicines are outlined above.  Orders Placed This Encounter  Procedures  . CT CORONARY MORPH W/CTA COR W/SCORE W/CA W/CM &/OR WO/CM  . CT ANGIO ABDOMEN PELVIS  W &/OR WO CONTRAST  . CT ANGIO CHEST AORTA W &/OR WO CONTRAST  . Basic metabolic panel  . CBC with Differential/Platelet  . Pulmonary Function Test   No orders of the defined types were placed in this encounter.   Patient Instructions  Medication Instructions:  Your provider recommends that you continue on your current medications as directed. Please refer to the Current Medication list given to you today.    Labwork: TODAY: BMET, CBC  Testing/Procedures: Your physician has requested that you have a cardiac catheterization. Cardiac catheterization is used to diagnose and/or treat various heart conditions. Doctors may recommend this procedure for a number of different reasons. The most common reason is to evaluate chest pain. Chest pain can be a symptom of coronary artery disease (CAD), and cardiac catheterization can show whether plaque is narrowing or blocking your heart's arteries. This procedure is also used to evaluate the valves, as well as measure the blood flow and oxygen levels in different parts of your heart. For further information please visit [email or web address]. Please follow instruction sheet, as given.  Follow-Up: The TAVR team will contact you to arrange further appointments!   HEART CATHETERIZATION INSTRUCTIONS:  You are scheduled for a Cardiac Catheterization on Tuesday, February 4 with Dr. [name].  1. Please arrive at the [facility] (Main Entrance A) at [facility]: [address] [place], [place] [address] at  (This time is two hours before your procedure to ensure your preparation). Free valet parking service is available.   Special note: Every effort is made to have your procedure done on time. Please understand  that emergencies sometimes delay scheduled procedures.  2. Diet: Do not eat solid foods after midnight.  The patient may have clear liquids until 5am upon the day of the procedure.  3. Labs: TODAY.  4. Medication instructions in preparation for your procedure:  1) MAKE SURE TO TAKE YOUR ASPIRIN the morning of the procedure.  2) You may take all other medications as directed with sips of water  5. Plan for one night stay--bring personal belongings. 6. Bring a current list of your medications and current insurance cards. 7. You MUST have a responsible person to drive you home. 8. Someone MUST be with you the first 24 hours after you arrive home or your discharge will be delayed. 9. Please wear clothes that are easy to get on and off and wear slip-on shoes.  Thank you for allowing [place] to care for you!   -- [facility] Cardiovascular services       Signed, [name], MD  02/15/2018 9:56 AM    [place]

## 2018-02-15 NOTE — H&P (View-Only) (Signed)
Cardiology Office Note:    Date:  02/15/2018   ID:  [name], DOB 12-Aug-1931, MRN [id number]  PCP:  Celene Squibb, MD  Cardiologist:  Kate Sable, MD  Electrophysiologist:  None   Referring MD: Celene Squibb, MD   Chief Complaint  Patient presents with  . Shortness of Breath    History of Present Illness:    [name] is a 85 y.o. male presenting for evaluation of aortic stenosis, referred by Dr Bronson Ing.  The patient is here with his wife today. I remember them from his wife's recent hospitalization for heart failure.   He complains of shortness of breath with activity over the past year. He is short of breath with walking at a brisk pace or with walking up stairs. He denies chest pain, chest pressure, lightheadedness, or syncope.  The patient has limitation related to his Parkinson's disease as well.  He denies leg swelling, orthopnea, or PND.  He is never had any heart problems and specifically denies any history of heart catheterization, myocardial infarction, or congestive heart failure.  He has not been hospitalized with any heart problems.  He does have an infrarenal abdominal aortic aneurysm that has been followed with serial ultrasound studies.  This most recently measured a maximum diameter of 3.8 cm.  He has been told of a heart murmur for about 5 years.  They have some concerns about him undergoing a surgical procedure because at the time of his back surgery, he had a lot of problems with his Parkinson's disease during recovery.  His Parkinson's medications were not administered at his normal times.  Past Medical History:  Diagnosis Date  . Arthritis   . Cancer [facility])    h/o  skin  cancer  . Ex-smoker   . GERD (gastroesophageal reflux disease)   . Hypercholesteremia   . Hypertension   . Lumbar stenosis   . Parkinson's disease ([place])   . Sleep apnea    ?? sleep apnea....tested 2013 @ [facility]     . Sleep disorder     Past Surgical  History:  Procedure Laterality Date  . APPENDECTOMY    . BACK SURGERY  11/2013  . COLONOSCOPY  11/17/2008   Rourk: Sessile polyp in the cecum status post saline-assisted piecemeal polypectomy, resolution clipping and epinephrine injection therapy performed. Shallow sigmoid diverticula.. Tubular adenoma  . COLONOSCOPY  11/19/2008   Fields: Large amount of liquid blood with clots seen throughout the colon. Previous [place] resolution clip remained in place. Purple spot seen in the lateral aspect polypectomy site, 3 resolution clips placed here. 6 mm sessile ascending colon polyp seen and not manipulated.  . COLONOSCOPY  11/25/2008   Rourk: Blood-tinged colonic effluent, suspect recurrent post polypectomy bleeding status post placement of 3 clips on the polypectomy site on top of the 3 clips that already existed, one had fallen off since last procedure. One small polyp distal to the cecum not manipulated  . COLONOSCOPY  12/06/2009   Rourk: Internal hemorrhoids, scattered left-sided diverticula, cecal polyps, one snared and subsequently clipped, 2 adjacent diminutive polyps ablated. tubular adenoma  . COLONOSCOPY N/A 01/21/2015   Dr. Gala Romney: 1.5 cm carpet-type polyp just distal to the ileocecal valve removed piecemeal fashion and ablated. Small polyp in the rectum. Pathology-tubular adenomas  . COLONOSCOPY N/A 09/01/2015   Procedure: COLONOSCOPY;  Surgeon: Daneil Dolin, MD;  Location: [facility];  Service: Endoscopy;  Laterality: N/A;  845  . EYE SURGERY  bilateral  cataracts  . MENISCUS REPAIR     right knee  2000  . POLYPECTOMY  09/01/2015   Procedure: POLYPECTOMY;  Surgeon: Daneil Dolin, MD;  Location: [facility];  Service: Endoscopy;;  colon  . TONSILLECTOMY Bilateral     Current Medications: Current Meds  Medication Sig  . amLODipine (NORVASC) 5 MG tablet Take 5 mg by mouth daily.  . brimonidine (ALPHAGAN) 0.2 % ophthalmic solution INSTILL 1 DROP INTO EACH EYE THREE TIMES DAILY    . calcium gluconate 500 MG tablet Take 500 mg by mouth daily.  . carbidopa-levodopa (SINEMET IR) 25-100 MG tablet Take 2 at 6, 1 at 9, 2 at 12PM, 1 at 3PM, 2 pills at 6PM, and 1 at 9 PM  . dorzolamide-timolol (COSOPT) 22.3-6.8 MG/ML ophthalmic solution INSTILL 1 DROP INTO EACH EYE TWICE DAILY  . glucosamine-chondroitin 500-400 MG tablet Take 1 tablet by mouth once.  . latanoprost (XALATAN) 0.005 % ophthalmic solution INSTILL 1 DROP INTO EACH EYE AT BEDTIME  . losartan-hydrochlorothiazide (HYZAAR) 100-25 MG tablet   . Multiple Vitamin (MULTIVITAMIN) tablet Take 1 tablet by mouth daily.  Marland Kitchen omeprazole (PRILOSEC OTC) 20 MG tablet Take 20 mg by mouth daily.  . pramipexole (MIRAPEX) 0.5 MG tablet Take 1 tablet (0.5 mg total) by mouth 3 (three) times daily.  . vitamin C (ASCORBIC ACID) 500 MG tablet Take 500 mg by mouth daily.     Allergies:   Patient has no known allergies.   Social History   Socioeconomic History  . Marital status: Married    Spouse name: Clinical research associate  . Number of children: 4  . Years of education: 15  . Highest education level: Not on file  Occupational History  . Occupation: Retired   Scientific laboratory technician  . Financial resource strain: Not on file  . Food insecurity:    Worry: Not on file    Inability: Not on file  . Transportation needs:    Medical: Not on file    Non-medical: Not on file  Tobacco Use  . Smoking status: Former Smoker    Packs/day: 1.00    Types: Cigarettes, Pipe    Start date: 01/22/1951    Last attempt to quit: 01/21/1989    Years since quitting: 29.0  . Smokeless tobacco: Never Used  Substance and Sexual Activity  . Alcohol use: No    Alcohol/week: 0.0 standard drinks  . Drug use: No  . Sexual activity: Not on file  Lifestyle  . Physical activity:    Days per week: Not on file    Minutes per session: Not on file  . Stress: Not on file  Relationships  . Social connections:    Talks on phone: Not on file    Gets together: Not on file    Attends  religious service: Not on file    Active member of club or organization: Not on file    Attends meetings of clubs or organizations: Not on file    Relationship status: Not on file  Other Topics Concern  . Not on file  Social History Narrative  . Not on file     Family History: The patient's family history includes Cancer in his brother; Dementia in his mother; Stroke in his brother.  ROS:   Please see the history of present illness.    Positive for shortness of breath with activity, back pain, easy bruising, snoring.  All other systems reviewed and are negative.  EKGs/Labs/Other Studies Reviewed:  The following studies were reviewed today: Abdominal aortic aneurysm: FINDINGS: Abdominal aortic measurements as follows:  Proximal:  2.4 x 1.9 cm  Mid: 3.7 x 3.6 cm, similar to the 05/2014 examination, previously, 3.7 x 3.5 cm  Distal:  2.3 x 1.9 cm  Right common iliac artery: 1.7 x 1.2 cm  Left Common iliac artery: 1.2 x 1.1 cm  There is a moderate amount eccentric mixed echogenic plaque/mural thrombus within the dominant component the mid abdominal aortic aneurysm (representative images 8 through 12).  IMPRESSION: 1. Grossly unchanged aneurysmal dilatation of the mid aspect the abdominal aorta measuring 3.7 cm in diameter, similar to the 05/2014 examination. Aortic aneurysm NOS (ICD10-I71.9). Recommend follow-up aortic ultrasound in 2 years. This recommendation follows ACR consensus guidelines: White Paper of the ACR Incidental Findings Committee II on Vascular Findings. J Am Coll Radiol 2013; 10:789-794. 2.  Aortic Atherosclerosis (ICD10-I70.0).  Echo: Study Conclusions  - Left ventricle: The cavity size was normal. Wall thickness was   increased in a pattern of severe LVH. Systolic function was   normal. The estimated ejection fraction was in the range of 60%   to 65%. Wall motion was normal; there were no regional wall   motion abnormalities. Doppler  parameters are consistent with   abnormal left ventricular relaxation (grade 1 diastolic   dysfunction). Doppler parameters are consistent with high   ventricular filling pressure. - Aortic valve: Severely calcified annulus. Trileaflet; severely   thickened leaflets. There was moderate to severe stenosis. Mean   gradient by [facility] probe is 38 mmHg. Valve area (VTI): 0.92   cm^2. Valve area (Vmax): 0.96 cm^2. Valve area (Vmean): 0.98   cm^2. - Mitral valve: Mildly calcified annulus. Mildly thickened leaflets   . - Left atrium: The atrium was mildly dilated.  ------------------------------------------------------------------- Study data:  Comparison was made to the study of 02/16/2017.  Study status:  Routine.  Procedure:  The patient reported no pain pre or post test. Transthoracic echocardiography. Image quality was adequate.  Study completion:  There were no complications. Transthoracic echocardiography.  M-mode, complete 2D, spectral Doppler, and color Doppler.  Birthdate:  Patient birthdate: [birth year].  Age:  Patient is 85 yr old.  Sex:  Gender: male. BMI: 29.3 kg/m^2.  Blood pressure:     178/91  Patient status: Inpatient.  Study date:  Study date: 02/06/2018. Study time: 08:34 AM.  Location:  Echo laboratory.  -------------------------------------------------------------------  ------------------------------------------------------------------- Left ventricle:  The cavity size was normal. Wall thickness was increased in a pattern of severe LVH. Systolic function was normal. The estimated ejection fraction was in the range of 60% to 65%. Wall motion was normal; there were no regional wall motion abnormalities. Doppler parameters are consistent with abnormal left ventricular relaxation (grade 1 diastolic dysfunction). Doppler parameters are consistent with high ventricular filling pressure.   ------------------------------------------------------------------- Aortic  valve:   Severely calcified annulus. Trileaflet; severely thickened leaflets. Mean gradient by [facility] probe is 38 mmHg. Doppler:   There was moderate to severe stenosis.   There was no significant regurgitation.    VTI ratio of LVOT to aortic valve: 0.2. Valve area (VTI): 0.92 cm^2. Indexed valve area (VTI): 0.42 cm^2/m^2. Peak velocity ratio of LVOT to aortic valve: 0.21. Valve area (Vmax): 0.96 cm^2. Indexed valve area (Vmax): 0.44 cm^2/m^2. Mean velocity ratio of LVOT to aortic valve: 0.22. Valve area (Vmean): 0.98 cm^2. Indexed valve area (Vmean): 0.45 cm^2/m^2. Mean gradient (S): 31 mm Hg. Peak gradient (S): 57 mm Hg.  ------------------------------------------------------------------- Aorta:  Aortic  root: The aortic root was mildly dilated.  ------------------------------------------------------------------- Mitral valve:   Mildly calcified annulus. Mildly thickened leaflets .  Doppler:   There was no evidence for stenosis.   There was trivial regurgitation.    Peak gradient (D): 2 mm Hg.  ------------------------------------------------------------------- Left atrium:  The atrium was mildly dilated.  ------------------------------------------------------------------- Atrial septum:  No defect or patent foramen ovale was identified.   ------------------------------------------------------------------- Right ventricle:  The cavity size was normal. Systolic function was normal.  ------------------------------------------------------------------- Pulmonic valve:   Not well visualized.  Doppler:   There was no evidence for stenosis.   There was no significant regurgitation.   ------------------------------------------------------------------- Tricuspid valve:   Normal thickness leaflets.  Doppler:   There was no evidence for stenosis.   There was no significant regurgitation.   ------------------------------------------------------------------- Pulmonary artery:     Systolic pressure could not be accurately estimated.   Inadequate TR jet.  ------------------------------------------------------------------- Right atrium:  The atrium was normal in size.  ------------------------------------------------------------------- Pericardium:  There was no pericardial effusion.  ------------------------------------------------------------------- Systemic veins:  IVC is dilated with normal variation, estimated RA pressure is 8 mmHg.  ------------------------------------------------------------------- Measurements   Left ventricle                            Value          Reference  LV ID, ED, PLAX chordal           (L)     41.4  mm       43 - 52  LV ID, ES, PLAX chordal                   28.3  mm       23 - 38  LV PW thickness, ED                       14.8  mm       ---------  IVS/LV PW ratio, ED                       1.03           <=1.3    Ventricular septum                        Value          Reference  IVS thickness, ED                         15.3  mm       ---------    LVOT                                      Value          Reference  LVOT ID, S                                24    mm       ---------  LVOT area                                 4.52  cm^2     ---------  LVOT peak velocity, S                     80.14 cm/s     ---------  LVOT mean velocity, S                     52.4  cm/s     ---------  LVOT VTI, S                               17.74 cm       ---------  Stroke volume (SV), LVOT DP               50.3  ml       ---------  Stroke index (SV/bsa), LVOT DP            23.3  ml/m^2   ---------    Aortic valve                              Value          Reference  Aortic valve peak velocity, S             379   cm/s     ---------  Aortic valve mean velocity, S             243   cm/s     ---------  Aortic valve VTI, S                       87.7  cm       ---------  Aortic mean gradient, S                   31    mm Hg    ---------   Aortic peak gradient, S                   57    mm Hg    ---------  VTI ratio, LVOT/AV                        0.2            ---------  Aortic valve area, VTI                    0.92  cm^2     ---------  Aortic valve area/bsa, VTI                0.42  cm^2/m^2 ---------  Velocity ratio, peak, LVOT/AV             0.21           ---------  Aortic valve area, peak velocity          0.96  cm^2     ---------  Aortic valve area/bsa, peak               0.44  cm^2/m^2 ---------  velocity  Velocity ratio, mean, LVOT/AV             0.22           ---------  Aortic valve area, mean velocity          0.98  cm^2     ---------  Aortic valve area/bsa, mean  0.45  cm^2/m^2 ---------  velocity    Aorta                                     Value          Reference  Aortic root ID, ED                        42    mm       ---------    Left atrium                               Value          Reference  LA ID, A-P, ES                            37    mm       ---------  LA volume/bsa, ES, 1-p A4C                31.6  ml/m^2   ---------    Mitral valve                              Value          Reference  Mitral E-wave peak velocity               70.8  cm/s     ---------  Mitral A-wave peak velocity               116   cm/s     ---------  Mitral deceleration time          (L)     134   ms       150 - 230  Mitral peak gradient, D                   2     mm Hg    ---------  Mitral E/A ratio, peak                    0.6            ---------    Right ventricle                           Value          Reference  TAPSE                                     23    mm       ---------  RV s&', lateral, S                         13.6  cm/s     ---------  EKG:  EKG is not ordered today.  The patient's most recent EKG is reviewed from January 31, 2018 and this demonstrates normal sinus rhythm with a ventricular couplet.  There is no intraventricular conduction delay or bundle branch block.  Recent Labs: No  results found for requested labs within last 8760 hours.  Recent Lipid Panel No results found for: CHOL, TRIG, HDL, CHOLHDL, VLDL, LDLCALC, LDLDIRECT  Physical Exam:    VS:  BP (!) 150/98   Pulse 100   Ht 5\' 10"  (1.778 m)   Wt 206 lb 12.8 oz (93.8 kg)   SpO2 96%   BMI 29.67 kg/m     Wt Readings from Last 3 Encounters:  02/15/18 206 lb 12.8 oz (93.8 kg)  01/31/18 204 lb (92.5 kg)  08/20/17 192 lb 8 oz (87.3 kg)     GEN: Well nourished, well developed elderly male in no acute distress HEENT: Normal, parkinsonian features. NECK: No JVD; bilateral carotid bruits LYMPHATICS: No lymphadenopathy CARDIAC: RRR, 3/6 harsh late peaking systolic murmur with diminished A2.  No diastolic murmur. RESPIRATORY:  Clear to auscultation without rales, wheezing or rhonchi  ABDOMEN: Soft, non-tender, non-distended MUSCULOSKELETAL:  No edema; No deformity  SKIN: Warm and dry NEUROLOGIC:  Alert and oriented x 3, resting tremor noted PSYCHIATRIC:  Normal affect   ASSESSMENT:    1. Severe aortic stenosis    PLAN:    In order of problems listed above:  The patient presents with symptoms of New York Heart Association functional class II chronic diastolic heart failure in the setting of stage D1 severe aortic stenosis.  I have reviewed the natural history of aortic stenosis with the patient and their family members who are present today. We have discussed the limitations of medical therapy and the poor prognosis associated with symptomatic aortic stenosis. We have reviewed potential treatment options, including palliative medical therapy, conventional surgical aortic valve replacement, and transcatheter aortic valve replacement. We discussed treatment options in the context of the patient's specific comorbid medical conditions.   I have personally reviewed the patient's echo images.  He has vigorous LV systolic function with LVEF 60 to 65%.  He has severe calcification and restriction of the aortic  valve with significant leaflet thickening.  On my review of his images, his peak transaortic velocity is 3.8 m/s with peak and mean gradients of 57 and 31 mmHg.  The aortic valve dimensionless index is 0.2 and the aortic valve area calculates to 0.5 cm.  The mean gradient by [facility] probe is 38 mmHg.  Based on his progressive symptoms over the past year and presence of severe aortic stenosis, I think aortic valve replacement is clearly indicated.  The patient would be at high risk of a prolonged postoperative recovery with conventional surgery in the context of his advanced age and Parkinson's disease.  TAVR would likely be a preferable treatment option for this particular patient.  We discussed this at length today.  We reviewed the fact that further preoperative assessment is necessary in order to determine anatomic feasibility for TAVR and better define treatment options.  I have recommended right and left heart catheterization with possible PCI as well as CT angiography studies of the heart and the chest/abdomen/pelvis.  CT will help determine the feasibility of transfemoral access and specifically risks that might be associated with his infrarenal abdominal aneurysm.  I have reviewed the risks, indications, and alternatives to cardiac catheterization, possible angioplasty, and stenting with the patient. Risks include but are not limited to bleeding, infection, vascular injury, stroke, myocardial infection, arrhythmia, kidney injury, radiation-related injury in the case of prolonged fluoroscopy use, emergency cardiac surgery, and death. The patient understands the risks of serious complication is 1-2 in 9417 with diagnostic cardiac cath and 1-2% or less with angioplasty/stenting.   After the patient's studies are completed,  he will be referred for formal cardiac surgical evaluation as part of a multidisciplinary approach to his treatment plan.   Medication Adjustments/Labs and Tests Ordered: Current  medicines are reviewed at length with the patient today.  Concerns regarding medicines are outlined above.  Orders Placed This Encounter  Procedures  . CT CORONARY MORPH W/CTA COR W/SCORE W/CA W/CM &/OR WO/CM  . CT ANGIO ABDOMEN PELVIS  W &/OR WO CONTRAST  . CT ANGIO CHEST AORTA W &/OR WO CONTRAST  . Basic metabolic panel  . CBC with Differential/Platelet  . Pulmonary Function Test   No orders of the defined types were placed in this encounter.   Patient Instructions  Medication Instructions:  Your provider recommends that you continue on your current medications as directed. Please refer to the Current Medication list given to you today.    Labwork: TODAY: BMET, CBC  Testing/Procedures: Your physician has requested that you have a cardiac catheterization. Cardiac catheterization is used to diagnose and/or treat various heart conditions. Doctors may recommend this procedure for a number of different reasons. The most common reason is to evaluate chest pain. Chest pain can be a symptom of coronary artery disease (CAD), and cardiac catheterization can show whether plaque is narrowing or blocking your heart's arteries. This procedure is also used to evaluate the valves, as well as measure the blood flow and oxygen levels in different parts of your heart. For further information please visit [email or web address]. Please follow instruction sheet, as given.  Follow-Up: The TAVR team will contact you to arrange further appointments!   HEART CATHETERIZATION INSTRUCTIONS:  You are scheduled for a Cardiac Catheterization on Tuesday, February 4 with Dr. [name].  1. Please arrive at the [facility] (Main Entrance A) at [facility]: [address] [place], [place] [address] at  (This time is two hours before your procedure to ensure your preparation). Free valet parking service is available.   Special note: Every effort is made to have your procedure done on time. Please understand  that emergencies sometimes delay scheduled procedures.  2. Diet: Do not eat solid foods after midnight.  The patient may have clear liquids until 5am upon the day of the procedure.  3. Labs: TODAY.  4. Medication instructions in preparation for your procedure:  1) MAKE SURE TO TAKE YOUR ASPIRIN the morning of the procedure.  2) You may take all other medications as directed with sips of water  5. Plan for one night stay--bring personal belongings. 6. Bring a current list of your medications and current insurance cards. 7. You MUST have a responsible person to drive you home. 8. Someone MUST be with you the first 24 hours after you arrive home or your discharge will be delayed. 9. Please wear clothes that are easy to get on and off and wear slip-on shoes.  Thank you for allowing [place] to care for you!   -- [facility] Invasive Cardiovascular services       Signed, [name], MD  02/15/2018 9:56 AM    [place]

## 2018-02-18 ENCOUNTER — Telehealth: Payer: Self-pay | Admitting: *Deleted

## 2018-02-18 NOTE — Telephone Encounter (Signed)
Pt contacted pre-catheterization scheduled at [facility] for: Tuesday February 19, 2018 9 AM Verified arrival time and place: [place] Entrance A at: 7 AM  No solid food after midnight prior to cath, clear liquids until 5 AM day of procedure.  Hold: Losartan-HCTZ-day before and day of procedure. GFR 50 Pt has taken today.  Except hold medications AM meds can be  taken pre-cath with sip of water including: ASA 81 mg  Confirmed patient has responsible person to drive home post procedure and observe after arriving home: yes  I encouraged pt to drink water today for hydration pre procedure.

## 2018-02-19 ENCOUNTER — Encounter (HOSPITAL_COMMUNITY)
Admission: RE | Disposition: A | Discharge status: Home / Self Care | Payer: Self-pay | Source: Home / Self Care | Attending: Cardiovascular Disease

## 2018-02-19 ENCOUNTER — Encounter (HOSPITAL_COMMUNITY): Payer: Self-pay | Admitting: Cardiovascular Disease

## 2018-02-19 ENCOUNTER — Ambulatory Visit (HOSPITAL_COMMUNITY)
Admission: RE | Admit: 2018-02-19 | Discharge: 2018-02-20 | Disposition: A | Discharge status: Home / Self Care | Payer: Medicare Other | Attending: Cardiovascular Disease | Admitting: Cardiovascular Disease

## 2018-02-19 ENCOUNTER — Other Ambulatory Visit: Payer: Self-pay

## 2018-02-19 DIAGNOSIS — E785 Hyperlipidemia, unspecified: Secondary | ICD-10-CM | POA: Diagnosis not present

## 2018-02-19 DIAGNOSIS — Z955 Presence of coronary angioplasty implant and graft: Secondary | ICD-10-CM

## 2018-02-19 DIAGNOSIS — G4733 Obstructive sleep apnea (adult) (pediatric): Secondary | ICD-10-CM | POA: Diagnosis present

## 2018-02-19 DIAGNOSIS — I2584 Coronary atherosclerosis due to calcified coronary lesion: Secondary | ICD-10-CM | POA: Insufficient documentation

## 2018-02-19 DIAGNOSIS — Z79899 Other long term (current) drug therapy: Secondary | ICD-10-CM | POA: Insufficient documentation

## 2018-02-19 DIAGNOSIS — I2510 Atherosclerotic heart disease of native coronary artery without angina pectoris: Secondary | ICD-10-CM | POA: Insufficient documentation

## 2018-02-19 DIAGNOSIS — I110 Hypertensive heart disease with heart failure: Secondary | ICD-10-CM | POA: Insufficient documentation

## 2018-02-19 DIAGNOSIS — I350 Nonrheumatic aortic (valve) stenosis: Secondary | ICD-10-CM | POA: Diagnosis not present

## 2018-02-19 DIAGNOSIS — Z952 Presence of prosthetic heart valve: Secondary | ICD-10-CM | POA: Insufficient documentation

## 2018-02-19 DIAGNOSIS — E7800 Pure hypercholesterolemia, unspecified: Secondary | ICD-10-CM | POA: Insufficient documentation

## 2018-02-19 DIAGNOSIS — G20A1 Parkinson's disease without dyskinesia, without mention of fluctuations: Secondary | ICD-10-CM | POA: Diagnosis present

## 2018-02-19 DIAGNOSIS — G20 Parkinson's disease: Secondary | ICD-10-CM | POA: Diagnosis not present

## 2018-02-19 DIAGNOSIS — I25119 Atherosclerotic heart disease of native coronary artery with unspecified angina pectoris: Secondary | ICD-10-CM

## 2018-02-19 DIAGNOSIS — I5032 Chronic diastolic (congestive) heart failure: Secondary | ICD-10-CM | POA: Diagnosis not present

## 2018-02-19 DIAGNOSIS — K219 Gastro-esophageal reflux disease without esophagitis: Secondary | ICD-10-CM | POA: Diagnosis present

## 2018-02-19 DIAGNOSIS — M1990 Unspecified osteoarthritis, unspecified site: Secondary | ICD-10-CM | POA: Insufficient documentation

## 2018-02-19 DIAGNOSIS — I2582 Chronic total occlusion of coronary artery: Secondary | ICD-10-CM | POA: Diagnosis not present

## 2018-02-19 DIAGNOSIS — Z823 Family history of stroke: Secondary | ICD-10-CM | POA: Diagnosis not present

## 2018-02-19 DIAGNOSIS — I10 Essential (primary) hypertension: Secondary | ICD-10-CM | POA: Diagnosis present

## 2018-02-19 DIAGNOSIS — I060 Rheumatic aortic stenosis: Secondary | ICD-10-CM | POA: Diagnosis not present

## 2018-02-19 DIAGNOSIS — G4731 Primary central sleep apnea: Secondary | ICD-10-CM

## 2018-02-19 DIAGNOSIS — R0602 Shortness of breath: Secondary | ICD-10-CM | POA: Diagnosis not present

## 2018-02-19 DIAGNOSIS — Z87891 Personal history of nicotine dependence: Secondary | ICD-10-CM | POA: Diagnosis not present

## 2018-02-19 HISTORY — PX: RIGHT/LEFT HEART CATH AND CORONARY ANGIOGRAPHY: CATH118266

## 2018-02-19 HISTORY — DX: Obstructive sleep apnea (adult) (pediatric): G47.33

## 2018-02-19 HISTORY — PX: INTRAVASCULAR PRESSURE WIRE/FFR STUDY: CATH118243

## 2018-02-19 HISTORY — PX: CORONARY STENT INTERVENTION: CATH118234

## 2018-02-19 HISTORY — DX: Atherosclerotic heart disease of native coronary artery without angina pectoris: I25.10

## 2018-02-19 HISTORY — DX: Nonrheumatic aortic (valve) stenosis: I35.0

## 2018-02-19 LAB — POCT I-STAT 7, (LYTES, BLD GAS, ICA,H+H)
Acid-Base Excess: 1 mmol/L (ref 0.0–2.0)
Bicarbonate: 25.9 mmol/L (ref 20.0–28.0)
Calcium, Ion: 1.24 mmol/L (ref 1.15–1.40)
HCT: 39 % (ref 39.0–52.0)
Hemoglobin: 13.3 g/dL (ref 13.0–17.0)
O2 Saturation: 95 %
Potassium: 3.6 mmol/L (ref 3.5–5.1)
SODIUM: 139 mmol/L (ref 135–145)
TCO2: 27 mmol/L (ref 22–32)
pCO2 arterial: 41.7 mmHg (ref 32.0–48.0)
pH, Arterial: 7.401 (ref 7.350–7.450)
pO2, Arterial: 74 mmHg — ABNORMAL LOW (ref 83.0–108.0)

## 2018-02-19 LAB — POCT I-STAT EG7
Acid-Base Excess: 1 mmol/L (ref 0.0–2.0)
Bicarbonate: 26.5 mmol/L (ref 20.0–28.0)
Calcium, Ion: 1.17 mmol/L (ref 1.15–1.40)
HEMATOCRIT: 39 % (ref 39.0–52.0)
Hemoglobin: 13.3 g/dL (ref 13.0–17.0)
O2 SAT: 75 %
Potassium: 3.5 mmol/L (ref 3.5–5.1)
Sodium: 141 mmol/L (ref 135–145)
TCO2: 28 mmol/L (ref 22–32)
pCO2, Ven: 43.6 mmHg — ABNORMAL LOW (ref 44.0–60.0)
pH, Ven: 7.392 (ref 7.250–7.430)
pO2, Ven: 41 mmHg (ref 32.0–45.0)

## 2018-02-19 LAB — POCT ACTIVATED CLOTTING TIME
Activated Clotting Time: 252 seconds
Activated Clotting Time: 290 seconds

## 2018-02-19 SURGERY — RIGHT/LEFT HEART CATH AND CORONARY ANGIOGRAPHY
Anesthesia: LOCAL

## 2018-02-19 MED ORDER — CARBIDOPA-LEVODOPA 25-100 MG PO TABS
1.0000 | ORAL_TABLET | ORAL | Status: DC
  Administered 2018-02-19 – 2018-02-20 (×2): 1 via ORAL
  Filled 2018-02-19 (×3): qty 1

## 2018-02-19 MED ORDER — LATANOPROST 0.005 % OP SOLN
1.0000 [drp] | Freq: Every day | OPHTHALMIC | Status: DC
  Administered 2018-02-19: 1 [drp] via OPHTHALMIC
  Filled 2018-02-19: qty 2.5

## 2018-02-19 MED ORDER — DORZOLAMIDE HCL-TIMOLOL MAL 2-0.5 % OP SOLN
1.0000 [drp] | Freq: Two times a day (BID) | OPHTHALMIC | Status: DC
  Administered 2018-02-19 – 2018-02-20 (×2): 1 [drp] via OPHTHALMIC
  Filled 2018-02-19: qty 10

## 2018-02-19 MED ORDER — NITROGLYCERIN 1 MG/10 ML FOR IR/CATH LAB
INTRA_ARTERIAL | Status: AC
  Filled 2018-02-19: qty 10

## 2018-02-19 MED ORDER — VITAMIN C 500 MG PO TABS
500.0000 mg | ORAL_TABLET | Freq: Every day | ORAL | Status: DC
  Administered 2018-02-20: 500 mg via ORAL
  Filled 2018-02-19: qty 1

## 2018-02-19 MED ORDER — NITROGLYCERIN 1 MG/10 ML FOR IR/CATH LAB
INTRA_ARTERIAL | Status: DC | PRN
  Administered 2018-02-19 (×2): 100 ug via INTRACORONARY

## 2018-02-19 MED ORDER — HEPARIN SODIUM (PORCINE) 1000 UNIT/ML IJ SOLN
INTRAMUSCULAR | Status: DC | PRN
  Administered 2018-02-19: 4000 [IU] via INTRAVENOUS
  Administered 2018-02-19: 4500 [IU] via INTRAVENOUS
  Administered 2018-02-19: 2000 [IU] via INTRAVENOUS

## 2018-02-19 MED ORDER — LABETALOL HCL 5 MG/ML IV SOLN
INTRAVENOUS | Status: AC
  Filled 2018-02-19: qty 4

## 2018-02-19 MED ORDER — FENTANYL CITRATE (PF) 100 MCG/2ML IJ SOLN
INTRAMUSCULAR | Status: DC | PRN
  Administered 2018-02-19: 25 ug via INTRAVENOUS

## 2018-02-19 MED ORDER — BRIMONIDINE TARTRATE 0.2 % OP SOLN
1.0000 [drp] | Freq: Three times a day (TID) | OPHTHALMIC | Status: DC
  Administered 2018-02-19 – 2018-02-20 (×2): 1 [drp] via OPHTHALMIC
  Filled 2018-02-19: qty 5

## 2018-02-19 MED ORDER — AMLODIPINE BESYLATE 5 MG PO TABS
5.0000 mg | ORAL_TABLET | Freq: Every day | ORAL | Status: DC
  Administered 2018-02-20: 11:00:00 5 mg via ORAL
  Filled 2018-02-19: qty 1

## 2018-02-19 MED ORDER — ASPIRIN 81 MG PO CHEW
81.0000 mg | CHEWABLE_TABLET | Freq: Every day | ORAL | Status: DC
  Administered 2018-02-20: 81 mg via ORAL
  Filled 2018-02-19: qty 1

## 2018-02-19 MED ORDER — SODIUM CHLORIDE 0.9% FLUSH: 3.0000 mL | INTRAVENOUS | Status: DC | PRN

## 2018-02-19 MED ORDER — SODIUM CHLORIDE 0.9% FLUSH
3.0000 mL | Freq: Two times a day (BID) | INTRAVENOUS | Status: DC
  Administered 2018-02-19: 3 mL via INTRAVENOUS

## 2018-02-19 MED ORDER — CANGRELOR BOLUS VIA INFUSION
INTRAVENOUS | Status: DC | PRN
  Administered 2018-02-19: 2790 ug via INTRAVENOUS

## 2018-02-19 MED ORDER — SODIUM CHLORIDE 0.9 % WEIGHT BASED INFUSION
1.0000 mL/kg/h | INTRAVENOUS | Status: AC
  Administered 2018-02-19: 17:00:00 1 mL/kg/h via INTRAVENOUS

## 2018-02-19 MED ORDER — HYDRALAZINE HCL 20 MG/ML IJ SOLN: 5.0000 mg | INTRAMUSCULAR | Status: AC | PRN

## 2018-02-19 MED ORDER — MIDAZOLAM HCL 2 MG/2ML IJ SOLN
INTRAMUSCULAR | Status: DC | PRN
  Administered 2018-02-19: 1 mg via INTRAVENOUS

## 2018-02-19 MED ORDER — SODIUM CHLORIDE 0.9% FLUSH: 3.0000 mL | Freq: Two times a day (BID) | INTRAVENOUS | Status: DC

## 2018-02-19 MED ORDER — GLUCOSAMINE-CHONDROITIN 500-400 MG PO TABS: 1.0000 | ORAL_TABLET | Freq: Every day | ORAL | Status: DC

## 2018-02-19 MED ORDER — CLOPIDOGREL BISULFATE 75 MG PO TABS
75.0000 mg | ORAL_TABLET | Freq: Every day | ORAL | Status: DC
  Administered 2018-02-20: 08:00:00 75 mg via ORAL
  Filled 2018-02-19: qty 1

## 2018-02-19 MED ORDER — SODIUM CHLORIDE 0.9 % IV SOLN: 250.0000 mL | INTRAVENOUS | Status: DC | PRN

## 2018-02-19 MED ORDER — HEPARIN SODIUM (PORCINE) 1000 UNIT/ML IJ SOLN
INTRAMUSCULAR | Status: AC
  Filled 2018-02-19: qty 1

## 2018-02-19 MED ORDER — CLOPIDOGREL BISULFATE 300 MG PO TABS
ORAL_TABLET | ORAL | Status: AC
  Filled 2018-02-19: qty 2

## 2018-02-19 MED ORDER — HEPARIN (PORCINE) IN NACL 1000-0.9 UT/500ML-% IV SOLN
INTRAVENOUS | Status: DC | PRN
  Administered 2018-02-19 (×3): 500 mL

## 2018-02-19 MED ORDER — SODIUM CHLORIDE 0.9 % IV SOLN: 4.0000 ug/kg/min | INTRAVENOUS | Status: DC

## 2018-02-19 MED ORDER — VERAPAMIL HCL 2.5 MG/ML IV SOLN
INTRAVENOUS | Status: DC | PRN
  Administered 2018-02-19: 10 mL via INTRA_ARTERIAL

## 2018-02-19 MED ORDER — CARBIDOPA-LEVODOPA 25-100 MG PO TABS
1.0000 | ORAL_TABLET | ORAL | Status: DC
  Administered 2018-02-19: 2 via ORAL
  Filled 2018-02-19 (×3): qty 2

## 2018-02-19 MED ORDER — CARBIDOPA-LEVODOPA 25-100 MG PO TABS
2.0000 | ORAL_TABLET | ORAL | Status: DC
  Administered 2018-02-19 – 2018-02-20 (×2): 2 via ORAL
  Filled 2018-02-19 (×3): qty 2

## 2018-02-19 MED ORDER — SODIUM CHLORIDE 0.9 % IV SOLN
INTRAVENOUS | Status: AC | PRN
  Administered 2018-02-19: 4 ug/kg/min via INTRAVENOUS

## 2018-02-19 MED ORDER — CLOPIDOGREL BISULFATE 300 MG PO TABS
600.0000 mg | ORAL_TABLET | Freq: Once | ORAL | Status: AC
  Administered 2018-02-19: 600 mg via ORAL

## 2018-02-19 MED ORDER — ONDANSETRON HCL 4 MG/2ML IJ SOLN: 4.0000 mg | Freq: Four times a day (QID) | INTRAMUSCULAR | Status: DC | PRN

## 2018-02-19 MED ORDER — ADULT MULTIVITAMIN W/MINERALS CH
1.0000 | ORAL_TABLET | Freq: Every day | ORAL | Status: DC
  Administered 2018-02-20: 1 via ORAL
  Filled 2018-02-19: qty 1

## 2018-02-19 MED ORDER — ASPIRIN 81 MG PO CHEW: 81.0000 mg | CHEWABLE_TABLET | Freq: Once | ORAL | Status: DC

## 2018-02-19 MED ORDER — LABETALOL HCL 5 MG/ML IV SOLN
10.0000 mg | INTRAVENOUS | Status: AC | PRN
  Administered 2018-02-19: 10 mg via INTRAVENOUS

## 2018-02-19 MED ORDER — PANTOPRAZOLE SODIUM 20 MG PO TBEC
20.0000 mg | DELAYED_RELEASE_TABLET | Freq: Every day | ORAL | Status: DC
  Administered 2018-02-20: 20 mg via ORAL
  Filled 2018-02-19: qty 1

## 2018-02-19 MED ORDER — CALCIUM GLUCONATE 500 MG PO TABS
500.0000 mg | ORAL_TABLET | Freq: Every day | ORAL | Status: DC
  Filled 2018-02-19: qty 1

## 2018-02-19 MED ORDER — VERAPAMIL HCL 2.5 MG/ML IV SOLN
INTRAVENOUS | Status: AC
  Filled 2018-02-19: qty 2

## 2018-02-19 MED ORDER — HEPARIN (PORCINE) IN NACL 1000-0.9 UT/500ML-% IV SOLN
INTRAVENOUS | Status: AC
  Filled 2018-02-19: qty 1000

## 2018-02-19 MED ORDER — OMEPRAZOLE MAGNESIUM 20 MG PO TBEC: 20.0000 mg | DELAYED_RELEASE_TABLET | Freq: Every day | ORAL | Status: DC

## 2018-02-19 MED ORDER — IOHEXOL 350 MG/ML SOLN
INTRAVENOUS | Status: DC | PRN
  Administered 2018-02-19: 110 mL via INTRA_ARTERIAL

## 2018-02-19 MED ORDER — MIDAZOLAM HCL 2 MG/2ML IJ SOLN
INTRAMUSCULAR | Status: AC
  Filled 2018-02-19: qty 2

## 2018-02-19 MED ORDER — ACETAMINOPHEN 325 MG PO TABS: 650.0000 mg | ORAL_TABLET | ORAL | Status: DC | PRN

## 2018-02-19 MED ORDER — SODIUM CHLORIDE 0.9 % WEIGHT BASED INFUSION: 1.0000 mL/kg/h | INTRAVENOUS | Status: DC

## 2018-02-19 MED ORDER — LIDOCAINE HCL (PF) 1 % IJ SOLN
INTRAMUSCULAR | Status: AC
  Filled 2018-02-19: qty 30

## 2018-02-19 MED ORDER — ASPIRIN 81 MG PO CHEW: 81.0000 mg | CHEWABLE_TABLET | ORAL | Status: DC

## 2018-02-19 MED ORDER — PRAMIPEXOLE DIHYDROCHLORIDE 0.25 MG PO TABS
0.5000 mg | ORAL_TABLET | Freq: Three times a day (TID) | ORAL | Status: DC
  Administered 2018-02-19 – 2018-02-20 (×4): 0.5 mg via ORAL
  Filled 2018-02-19 (×6): qty 2

## 2018-02-19 MED ORDER — LIDOCAINE HCL (PF) 1 % IJ SOLN
INTRAMUSCULAR | Status: DC | PRN
  Administered 2018-02-19 (×2): 2 mL

## 2018-02-19 MED ORDER — SODIUM CHLORIDE 0.9 % WEIGHT BASED INFUSION
3.0000 mL/kg/h | INTRAVENOUS | Status: DC
  Administered 2018-02-19: 3 mL/kg/h via INTRAVENOUS

## 2018-02-19 MED ORDER — HEPARIN (PORCINE) IN NACL 1000-0.9 UT/500ML-% IV SOLN
INTRAVENOUS | Status: AC
  Filled 2018-02-19: qty 500

## 2018-02-19 MED ORDER — ANGIOPLASTY BOOK
Freq: Once | Status: AC
  Administered 2018-02-19: 23:00:00
  Filled 2018-02-19: qty 1

## 2018-02-19 MED ORDER — CANGRELOR TETRASODIUM 50 MG IV SOLR
INTRAVENOUS | Status: AC
  Filled 2018-02-19: qty 50

## 2018-02-19 MED ORDER — FENTANYL CITRATE (PF) 100 MCG/2ML IJ SOLN
INTRAMUSCULAR | Status: AC
  Filled 2018-02-19: qty 2

## 2018-02-19 SURGICAL SUPPLY — 19 items
BALLN SAPPHIRE 2.5X20 (BALLOONS) ×2
BALLN SAPPHIRE ~~LOC~~ 3.5X18 (BALLOONS) ×2 IMPLANT
BALLOON SAPPHIRE 2.5X20 (BALLOONS) ×1 IMPLANT
CATH 5FR JL3.5 JR4 ANG PIG MP (CATHETERS) ×2 IMPLANT
CATH BALLN WEDGE 5F 110CM (CATHETERS) ×2 IMPLANT
CATH LAUNCHER 6FR EBU3.5 (CATHETERS) ×2 IMPLANT
DEVICE RAD COMP TR BAND LRG (VASCULAR PRODUCTS) ×2 IMPLANT
GLIDESHEATH SLEND SS 6F .021 (SHEATH) ×2 IMPLANT
GUIDEWIRE INQWIRE 1.5J.035X260 (WIRE) ×1 IMPLANT
GUIDEWIRE PRESSURE COMET II (WIRE) ×2 IMPLANT
INQWIRE 1.5J .035X260CM (WIRE) ×2
KIT ENCORE 26 ADVANTAGE (KITS) ×2 IMPLANT
KIT HEART LEFT (KITS) ×2 IMPLANT
KIT HEMO VALVE WATCHDOG (MISCELLANEOUS) ×2 IMPLANT
PACK CARDIAC CATHETERIZATION (CUSTOM PROCEDURE TRAY) ×2 IMPLANT
SHEATH GLIDE SLENDER 4/5FR (SHEATH) ×2 IMPLANT
STENT SYNERGY DES 3X38 (Permanent Stent) ×2 IMPLANT
TRANSDUCER W/STOPCOCK (MISCELLANEOUS) ×2 IMPLANT
TUBING CIL FLEX 10 FLL-RA (TUBING) ×2 IMPLANT

## 2018-02-19 NOTE — Progress Notes (Signed)
TRBAND REMOVAL  LOCATION:    right radial  DEFLATED PER PROTOCOL:    Yes.    TIME BAND OFF / DRESSING APPLIED:    1615   SITE UPON ARRIVAL:    Level 0  SITE AFTER BAND REMOVAL:    Level 0  CIRCULATION SENSATION AND MOVEMENT:    Within Normal Limits   Yes.    COMMENTS:   Tolerated procedure well

## 2018-02-19 NOTE — Interval H&P Note (Signed)
History and Physical Interval Note:  02/19/2018 9:25 AM  [name]  has presented today for surgery, with the diagnosis of aort. sten.  The various methods of treatment have been discussed with the patient and family. After consideration of risks, benefits and other options for treatment, the patient has consented to  Procedure(s): RIGHT/LEFT HEART CATH AND CORONARY ANGIOGRAPHY (N/A) as a surgical intervention .  The patient's history has been reviewed, patient examined, no change in status, stable for surgery.  I have reviewed the patient's chart and labs.  Questions were answered to the patient's satisfaction.     [name]

## 2018-02-20 ENCOUNTER — Ambulatory Visit: Payer: Medicare Other | Admitting: Neurology

## 2018-02-20 DIAGNOSIS — G20 Parkinson's disease: Secondary | ICD-10-CM | POA: Diagnosis not present

## 2018-02-20 DIAGNOSIS — I5032 Chronic diastolic (congestive) heart failure: Secondary | ICD-10-CM | POA: Diagnosis not present

## 2018-02-20 DIAGNOSIS — E785 Hyperlipidemia, unspecified: Secondary | ICD-10-CM | POA: Diagnosis not present

## 2018-02-20 DIAGNOSIS — I2510 Atherosclerotic heart disease of native coronary artery without angina pectoris: Secondary | ICD-10-CM | POA: Diagnosis not present

## 2018-02-20 DIAGNOSIS — G4733 Obstructive sleep apnea (adult) (pediatric): Secondary | ICD-10-CM | POA: Diagnosis not present

## 2018-02-20 DIAGNOSIS — I110 Hypertensive heart disease with heart failure: Secondary | ICD-10-CM | POA: Diagnosis not present

## 2018-02-20 DIAGNOSIS — I060 Rheumatic aortic stenosis: Secondary | ICD-10-CM | POA: Diagnosis not present

## 2018-02-20 DIAGNOSIS — I2584 Coronary atherosclerosis due to calcified coronary lesion: Secondary | ICD-10-CM | POA: Diagnosis not present

## 2018-02-20 DIAGNOSIS — I2582 Chronic total occlusion of coronary artery: Secondary | ICD-10-CM | POA: Diagnosis not present

## 2018-02-20 DIAGNOSIS — Z87891 Personal history of nicotine dependence: Secondary | ICD-10-CM | POA: Diagnosis not present

## 2018-02-20 DIAGNOSIS — R0602 Shortness of breath: Secondary | ICD-10-CM | POA: Diagnosis not present

## 2018-02-20 DIAGNOSIS — E7800 Pure hypercholesterolemia, unspecified: Secondary | ICD-10-CM | POA: Diagnosis not present

## 2018-02-20 DIAGNOSIS — I350 Nonrheumatic aortic (valve) stenosis: Secondary | ICD-10-CM | POA: Diagnosis not present

## 2018-02-20 LAB — CBC
HCT: 39.7 % (ref 39.0–52.0)
Hemoglobin: 14.1 g/dL (ref 13.0–17.0)
MCH: 34.9 pg — AB (ref 26.0–34.0)
MCHC: 35.5 g/dL (ref 30.0–36.0)
MCV: 98.3 fL (ref 80.0–100.0)
Platelets: 140 10*3/uL — ABNORMAL LOW (ref 150–400)
RBC: 4.04 MIL/uL — AB (ref 4.22–5.81)
RDW: 13 % (ref 11.5–15.5)
WBC: 9.1 10*3/uL (ref 4.0–10.5)
nRBC: 0 % (ref 0.0–0.2)

## 2018-02-20 LAB — LIPID PANEL
CHOL/HDL RATIO: 3.9 ratio
Cholesterol: 144 mg/dL (ref 0–200)
HDL: 37 mg/dL — ABNORMAL LOW (ref >40)
LDL Cholesterol: 93 mg/dL (ref 0–99)
Triglycerides: 68 mg/dL (ref <150)
VLDL: 14 mg/dL (ref 0–40)

## 2018-02-20 LAB — BASIC METABOLIC PANEL
ANION GAP: 8 (ref 5–15)
BUN: 19 mg/dL (ref 8–23)
CALCIUM: 8.6 mg/dL — AB (ref 8.9–10.3)
CO2: 24 mmol/L (ref 22–32)
Chloride: 108 mmol/L (ref 98–111)
Creatinine, Ser: 1.14 mg/dL (ref 0.61–1.24)
GFR calc Af Amer: >60 mL/min (ref >60)
GFR calc non Af Amer: 59 mL/min — ABNORMAL LOW (ref >60)
Glucose, Bld: 132 mg/dL — ABNORMAL HIGH (ref 70–99)
Potassium: 3.5 mmol/L (ref 3.5–5.1)
Sodium: 140 mmol/L (ref 135–145)

## 2018-02-20 MED ORDER — METOPROLOL TARTRATE 12.5 MG HALF TABLET
12.5000 mg | ORAL_TABLET | Freq: Two times a day (BID) | ORAL | Status: DC
  Administered 2018-02-20: 12.5 mg via ORAL
  Filled 2018-02-20: qty 1

## 2018-02-20 MED ORDER — NITROGLYCERIN 0.4 MG SL SUBL: 0.4000 mg | SUBLINGUAL_TABLET | SUBLINGUAL | 12 refills | Status: DC | PRN

## 2018-02-20 MED ORDER — CALCIUM CARBONATE 1250 (500 CA) MG PO TABS
1.0000 | ORAL_TABLET | Freq: Every day | ORAL | Status: DC
  Administered 2018-02-20: 500 mg via ORAL
  Filled 2018-02-20: qty 1

## 2018-02-20 MED ORDER — POTASSIUM CHLORIDE CRYS ER 20 MEQ PO TBCR
40.0000 meq | EXTENDED_RELEASE_TABLET | Freq: Once | ORAL | Status: AC
  Administered 2018-02-20: 40 meq via ORAL
  Filled 2018-02-20: qty 2

## 2018-02-20 MED ORDER — ATORVASTATIN CALCIUM 40 MG PO TABS: 40.0000 mg | ORAL_TABLET | Freq: Every day | ORAL | 6 refills | Status: DC

## 2018-02-20 MED ORDER — CLOPIDOGREL BISULFATE 75 MG PO TABS: 75.0000 mg | ORAL_TABLET | Freq: Every day | ORAL | 6 refills | Status: DC

## 2018-02-20 MED ORDER — ATORVASTATIN CALCIUM 40 MG PO TABS: 40.0000 mg | ORAL_TABLET | Freq: Every day | ORAL | Status: DC

## 2018-02-20 MED ORDER — PANTOPRAZOLE SODIUM 20 MG PO TBEC: 20.0000 mg | DELAYED_RELEASE_TABLET | Freq: Every day | ORAL | 6 refills | Status: DC

## 2018-02-20 MED ORDER — ASPIRIN EC 81 MG PO TBEC: 81.0000 mg | DELAYED_RELEASE_TABLET | Freq: Every day | ORAL | 3 refills | Status: AC

## 2018-02-20 MED ORDER — METOPROLOL TARTRATE 25 MG PO TABS: 12.5000 mg | ORAL_TABLET | Freq: Two times a day (BID) | ORAL | 6 refills | Status: DC

## 2018-02-20 MED FILL — ATORVASTATIN CALCIUM 40 MG: 40 | 30 days supply | Qty: 30 | Fill #0 | Status: TO

## 2018-02-20 MED FILL — CLOPIDOGREL 75 MG TABLET: 75 | 30 days supply | Qty: 30 | Fill #0 | Status: TO

## 2018-02-20 MED FILL — NITROGLYCERIN 0.4 MG TAB SL: 0.4 | 8 days supply | Qty: 25 | Fill #0 | Status: TO

## 2018-02-20 MED FILL — PANTOPRAZOLE SOD DR 20 MG T: 20 | 30 days supply | Qty: 30 | Fill #0 | Status: TO

## 2018-02-20 MED FILL — ASPIRIN LOW DOSE 81 MG TBEC: 81 | 30 days supply | Qty: 30 | Fill #0 | Status: TO

## 2018-02-20 MED FILL — METOPROLOL TARTRATE 25 MG T: 25 | 30 days supply | Qty: 30 | Fill #0 | Status: TO

## 2018-02-20 NOTE — Progress Notes (Signed)
CARDIAC REHAB PHASE I   PRE:  Rate/Rhythm: 92 SR with PVCs  BP:  Sitting: 123/81      SaO2: 92 RA  MODE:  Ambulation: 450 ft   POST:  Rate/Rhythm: 96 SR with PVCs  BP:  Sitting: 139/83    SaO2: 89 RA  Pt ambulated 471ft in hallway assist of one with gait belt. Pt SOB upon return to room, O2 sats 89 on RA. Pt having w/u for valve sx. Pt educated on importance of ASA and Plavix. Pt denies ever having CP, but states he knows how to use NTG because his wife uses it. Pt given heart healthy diet. Reviewed restrictions and exercise guidelines. Stressed importance of safety and listening to his body. Will refer to CRP II [place] with knowledge pt is having TAVR w/u.  [id number] Rufina Falco, RN BSN 02/20/2018 8:54 AM

## 2018-02-20 NOTE — Progress Notes (Signed)
MEWS score 2 (yellow) due to RR of 30, which is an outlier value.

## 2018-02-20 NOTE — Discharge Summary (Addendum)
Discharge Summary    Patient ID: [name] MRN: [id number]; DOB: [birth year]  Admit date: 02/19/2018 Discharge date: 02/20/2018  Primary Care Provider: Celene Squibb, MD  Primary Cardiologist: Kate Sable, MD  TVAR: Dr. Lupe Carney  Discharge Diagnoses    Principal Problem:   Severe aortic stenosis Active Problems:   HLD (hyperlipidemia)   Essential hypertension   GERD   Primary central sleep apnea   Parkinson's   OSA (obstructive sleep apnea)   Chronic diastolic CHF  Allergies No Known Allergies  Diagnostic Studies/Procedures    CORONARY STENT INTERVENTION  INTRAVASCULAR PRESSURE WIRE/FFR STUDY  RIGHT/LEFT HEART CATH AND CORONARY ANGIOGRAPHY  Conclusion     1st Diag lesion is 50% stenosed.  Ost 1st Mrg to 1st Mrg lesion is 30% stenosed.   1.  Severe RCA stenosis with subtotal occlusion of the distal RCA and collaterals from the left coronary artery supplying the PDA and PL branches 2.  Severe mid LAD stenosis with abnormal pressure wire analysis, treated successfully with PCI using a drug-eluting stent 3.  Patent left mainstem and left circumflex 4.  Severe aortic stenosis with mean transvalvular gradient 30 mmHg and peak gradient 42 mmHg  Recommendations: Dual antiplatelet therapy with aspirin and clopidogrel for at least 6 months.  Continue TAVR evaluation.  Medical therapy for residual CAD.   Diagnostic  Dominance: Right    Intervention        History of Present Illness     [name] is a 85 y.o. male with hx of HTN, HLD, sleep apnea, GERD, parkinson's disease, chronic diastolic CHF,  infrarenal abdominal aortic aneurysm  and severe aortic stenosis presents for outpatient cath.   He complains of shortness of breath with activity over the past year. He is short of breath with walking at a brisk pace or with walking up stairs. He denies chest pain, chest pressure, lightheadedness, or syncope.  The patient has limitation related to  his Parkinson's disease as well.  He denies leg swelling, orthopnea, or PND.  He is never had any heart problems and specifically denies any history of heart catheterization, myocardial infarction, or congestive heart failure. He does have an infrarenal abdominal aortic aneurysm that has been followed with serial ultrasound studies.  This most recently measured a maximum diameter of 3.8 cm.  He has been told of a heart murmur for about 5 years.   His presentation compatible with with symptoms of [place] Heart Association functional class II chronic diastolic heart failure in the setting of stage D1 severe aortic stenosis.  Most recent echo showed LVEF of 60-65%. Peak transaortic velocity is 3.8 m/s with peak and mean gradients of 57 and 31 mmHg.  The aortic valve dimensionless index is 0.2 and the aortic valve area calculates to 0.5 cm.  The mean gradient by [facility] probe is 38 mmHg.   Hospital Course     Consultants: None  Cath showed severe mid LAD stenosis with abnormal pressure wire analysis s/p DES.   Severe RCA stenosis with subtotal occlusion of the distal RCA and collaterals from the left coronary artery supplying the PDA and PL branches. Recommended medical therapy. Severe aortic stenosis with mean transvalvular gradient 30 mmHg and peak gradient 42 mmHg. The patient will continue TVAR evaluation. Continue DAPT with ASA and plavix for at least 6 months. Start Lipitor 40mg  daily. Pending lipid panel. Add low dose BB. Ambulate.   She has been seen by Dr.Kassadie Pancake  today and deemed ready for  discharge home. All follow-up appointments have been scheduled. Discharge medications are listed below.   Discharge Vitals Blood pressure 123/81, pulse 90, temperature 97.7 F (36.5 C), temperature source Oral, resp. rate (!) 30, height 5\' 10"  (1.778 m), weight 93.5 kg, SpO2 92 %.  Filed Weights   02/19/18 0657 02/20/18 0320  Weight: 93 kg 93.5 kg   Physical Exam  Constitutional: He is oriented to  person, place, and time and well-developed, well-nourished, and in no distress.  HENT:  Head: Normocephalic and atraumatic.  Eyes: Pupils are equal, round, and reactive to light.  Neck: Normal range of motion. Neck supple.  Cardiovascular: Normal rate and regular rhythm.  Murmur heard. Right radial cath site without hematoma   Pulmonary/Chest: Effort normal and breath sounds normal.  Abdominal: Soft. Bowel sounds are normal.  Musculoskeletal:     Comments: Resting tremors  Neurological: He is alert and oriented to person, place, and time.  Skin:  Multiple scattered bruise on extremity   Psychiatric: Affect normal.   Labs & Radiologic Studies    CBC Recent Labs    02/19/18 1000 02/20/18 0714  WBC  --  9.1  HGB 13.3  13.3 14.1  HCT 39.0  39.0 39.7  MCV  --  98.3  PLT  --  893*   Basic Metabolic Panel Recent Labs    02/19/18 1000 02/20/18 0714  NA 141  139 140  K 3.5  3.6 3.5  CL  --  108  CO2  --  24  GLUCOSE  --  132*  BUN  --  19  CREATININE  --  1.14  CALCIUM  --  8.6*  _____________  US Aorta Complete  Result Date: 01/24/2018 CLINICAL DATA:  Follow-up abdominal aortic aneurysm. History of hypertension and hyperlipidemia. Former smoker. EXAM: ULTRASOUND OF ABDOMINAL AORTA TECHNIQUE: Ultrasound examination of the abdominal aorta was performed to evaluate for abdominal aortic aneurysm. COMPARISON:  02/02/2017; 06/05/2014 FINDINGS: Abdominal aortic measurements as follows: Proximal:  2.4 x 1.9 cm Mid: 3.7 x 3.6 cm, similar to the 05/2014 examination, previously, 3.7 x 3.5 cm Distal:  2.3 x 1.9 cm Right common iliac artery: 1.7 x 1.2 cm Left Common iliac artery: 1.2 x 1.1 cm There is a moderate amount eccentric mixed echogenic plaque/mural thrombus within the dominant component the mid abdominal aortic aneurysm (representative images 8 through 12). IMPRESSION: 1. Grossly unchanged aneurysmal dilatation of the mid aspect the abdominal aorta measuring 3.7 cm in diameter,  similar to the 05/2014 examination. Aortic aneurysm NOS (ICD10-I71.9). Recommend follow-up aortic ultrasound in 2 years. This recommendation follows ACR consensus guidelines: White Paper of the ACR Incidental Findings Committee II on Vascular Findings. J Am Coll Radiol 2013; 10:789-794. 2.  Aortic Atherosclerosis (ICD10-I70.0). Electronically Signed   By: Sandi Mariscal M.D.   On: 01/24/2018 09:28   Disposition   Pt is being discharged home today in good condition.  Follow-up Plans & Appointments    Follow-up Information    Gaye Pollack, MD. Go on 03/13/2018.   Specialty:  Cardiothoracic Surgery Why:  @3 :30pm for TVAR evalaution  Contact information: [place] [address] [phone]          Discharge Instructions    Diet - low sodium heart healthy   Complete by:  As directed    Discharge instructions   Complete by:  As directed    No driving for 48 hours. No lifting over 5 lbs for 1 week. No sexual activity  for 1 week. Keep procedure site clean & dry. If you notice increased pain, swelling, bleeding or pus, call/return!  You may shower, but no soaking baths/hot tubs/pools for 1 week.   Increase activity slowly   Complete by:  As directed       Discharge Medications   Allergies as of 02/20/2018   No Known Allergies     Medication List    STOP taking these medications   aspirin 81 MG chewable tablet Replaced by:  aspirin EC 81 MG tablet   omeprazole 20 MG tablet Commonly known as:  PRILOSEC OTC     TAKE these medications   amLODipine 5 MG tablet Commonly known as:  NORVASC Take 5 mg by mouth daily.   aspirin EC 81 MG tablet Take 1 tablet (81 mg total) by mouth daily. Replaces:  aspirin 81 MG chewable tablet   atorvastatin 40 MG tablet Commonly known as:  LIPITOR Take 1 tablet (40 mg total) by mouth daily at 6 PM.   brimonidine 0.2 % ophthalmic solution Commonly known as:  ALPHAGAN Place 1 drop into both eyes 3 (three) times  daily.   calcium gluconate 500 MG tablet Take 500 mg by mouth daily.   carbidopa-levodopa 25-100 MG tablet Commonly known as:  SINEMET IR Take 2 at 6, 1 at 9, 2 at 12PM, 1 at 3PM, 2 pills at [place], and 1 at 9 PM What changed:    how much to take  how to take this  when to take this  additional instructions   clopidogrel 75 MG tablet Commonly known as:  PLAVIX Take 1 tablet (75 mg total) by mouth daily with breakfast. Start taking on:  February 21, 2018   dorzolamide-timolol 22.3-6.8 MG/ML ophthalmic solution Commonly known as:  COSOPT Place 1 drop into both eyes 2 (two) times daily.   glucosamine-chondroitin 500-400 MG tablet Take 1 tablet by mouth daily.   latanoprost 0.005 % ophthalmic solution Commonly known as:  XALATAN Place 1 drop into both eyes at bedtime.   losartan-hydrochlorothiazide 100-25 MG tablet Commonly known as:  HYZAAR Take 1 tablet by mouth daily.   metoprolol tartrate 25 MG tablet Commonly known as:  LOPRESSOR Take 0.5 tablets (12.5 mg total) by mouth 2 (two) times daily.   multivitamin tablet Take 1 tablet by mouth daily.   nitroGLYCERIN 0.4 MG SL tablet Commonly known as:  NITROSTAT Place 1 tablet (0.4 mg total) under the tongue every 5 (five) minutes as needed.   pantoprazole 20 MG tablet Commonly known as:  PROTONIX Take 1 tablet (20 mg total) by mouth daily.   pramipexole 0.5 MG tablet Commonly known as:  MIRAPEX Take 1 tablet (0.5 mg total) by mouth 3 (three) times daily.   vitamin C 500 MG tablet Commonly known as:  ASCORBIC ACID Take 500 mg by mouth daily.        Acute coronary syndrome (MI, NSTEMI, STEMI, etc) this admission?: No.    Outstanding Labs/Studies   Consider OP f/u labs 6-8 weeks given statin initiation this admission.  Duration of Discharge Encounter   Greater than 30 minutes including physician time.  Jarrett Soho, PA 02/20/2018, 8:38 AM  Patient seen, examined. Available data reviewed.  Agree with findings, assessment, and plan as outlined by Robbie Lis, PA.  The patient is independently interviewed and examined.  His right radial site is clear.  Lung fields are clear, heart is regular rate and rhythm with a grade 3/6 harsh systolic murmur at the right  upper sternal border, lower extremities have no edema.  His son is at the bedside.  We reviewed post PCI instructions.  They understand the importance of adherence to dual antiplatelet therapy with aspirin and clopidogrel.  He will continue with TAVR evaluation and his imaging tests and surgical consultation have been arranged.  [name], M.D. 02/20/2018 12:52 PM

## 2018-02-27 NOTE — Progress Notes (Signed)
Transitions of Care Follow Up Call Note  [name] is an 85 y.o. male who presented to [facility] on 02/19/2018.  The patient had the following prescriptions filled at [facility]: ASA, LIPITOR, LOPRESSOR, PROTONIX, PLAVIX, NTG  Patient was called by pharmacist and HIPAA identifiers were verified. The following questions were asked about the prescriptions filled at [facility]:  Has the patient been experiencing any side effects to the medications prescribed? no Understanding of regimen: fair Understanding of indications: fair Potential of compliance: fair  Pharmacist comments: NO ISSUES WITH MEDICATIONS PER WIFE (PT UNAVAILABLE X2 ATTEMPTS TO REACH)   [x]  Patient's prescriptions filled at the [facility] were transferred to the following pharmacy: [facility] []  Patient unable to be reached after calling three times and prescriptions filled at the [facility] Trans[facility] Pharm[facility] were transferred to preferred pharmacy found within their chart.   Mills Koller 02/27/2018, 5:13 PM Transitions of Care Pharmacy Hours: Monday - Friday 8:30am to 5:00 PM  Phone - [phone]

## 2018-02-28 ENCOUNTER — Emergency Department (HOSPITAL_COMMUNITY): Payer: Medicare Other

## 2018-02-28 ENCOUNTER — Ambulatory Visit (HOSPITAL_COMMUNITY): Payer: Medicare Other

## 2018-02-28 ENCOUNTER — Inpatient Hospital Stay (HOSPITAL_COMMUNITY): Admit: 2018-02-28 | Payer: Medicare Other

## 2018-02-28 ENCOUNTER — Ambulatory Visit (HOSPITAL_COMMUNITY)
Admit: 2018-02-28 | Discharge: 2018-02-28 | Disposition: A | Discharge status: Home / Self Care | Payer: Medicare Other | Attending: Cardiovascular Disease | Admitting: Cardiovascular Disease

## 2018-02-28 ENCOUNTER — Encounter (HOSPITAL_COMMUNITY): Payer: Self-pay

## 2018-02-28 ENCOUNTER — Emergency Department (HOSPITAL_COMMUNITY)
Admission: EM | Admit: 2018-02-28 | Discharge: 2018-02-28 | Disposition: A | Discharge status: Home / Self Care | Payer: Medicare Other | Attending: Emergency Medicine | Admitting: Emergency Medicine

## 2018-02-28 ENCOUNTER — Ambulatory Visit (HOSPITAL_COMMUNITY)
Admission: RE | Admit: 2018-02-28 | Discharge: 2018-02-28 | Disposition: A | Discharge status: Home / Self Care | Payer: Medicare Other | Source: Ambulatory Visit | Attending: Cardiovascular Disease | Admitting: Cardiovascular Disease

## 2018-02-28 DIAGNOSIS — S51012A Laceration without foreign body of left elbow, initial encounter: Secondary | ICD-10-CM | POA: Insufficient documentation

## 2018-02-28 DIAGNOSIS — S79912A Unspecified injury of left hip, initial encounter: Secondary | ICD-10-CM | POA: Diagnosis not present

## 2018-02-28 DIAGNOSIS — S0990XA Unspecified injury of head, initial encounter: Secondary | ICD-10-CM | POA: Diagnosis not present

## 2018-02-28 DIAGNOSIS — S6992XA Unspecified injury of left wrist, hand and finger(s), initial encounter: Secondary | ICD-10-CM | POA: Diagnosis not present

## 2018-02-28 DIAGNOSIS — G20 Parkinson's disease: Secondary | ICD-10-CM | POA: Diagnosis not present

## 2018-02-28 DIAGNOSIS — Z7982 Long term (current) use of aspirin: Secondary | ICD-10-CM | POA: Insufficient documentation

## 2018-02-28 DIAGNOSIS — I350 Nonrheumatic aortic (valve) stenosis: Secondary | ICD-10-CM

## 2018-02-28 DIAGNOSIS — Z85828 Personal history of other malignant neoplasm of skin: Secondary | ICD-10-CM | POA: Insufficient documentation

## 2018-02-28 DIAGNOSIS — Y9389 Activity, other specified: Secondary | ICD-10-CM | POA: Insufficient documentation

## 2018-02-28 DIAGNOSIS — Y929 Unspecified place or not applicable: Secondary | ICD-10-CM | POA: Diagnosis not present

## 2018-02-28 DIAGNOSIS — Z79899 Other long term (current) drug therapy: Secondary | ICD-10-CM | POA: Insufficient documentation

## 2018-02-28 DIAGNOSIS — W010XXA Fall on same level from slipping, tripping and stumbling without subsequent striking against object, initial encounter: Secondary | ICD-10-CM | POA: Insufficient documentation

## 2018-02-28 DIAGNOSIS — S4992XA Unspecified injury of left shoulder and upper arm, initial encounter: Secondary | ICD-10-CM | POA: Diagnosis not present

## 2018-02-28 DIAGNOSIS — G44309 Post-traumatic headache, unspecified, not intractable: Secondary | ICD-10-CM | POA: Insufficient documentation

## 2018-02-28 DIAGNOSIS — Y999 Unspecified external cause status: Secondary | ICD-10-CM | POA: Diagnosis not present

## 2018-02-28 DIAGNOSIS — Z23 Encounter for immunization: Secondary | ICD-10-CM | POA: Insufficient documentation

## 2018-02-28 DIAGNOSIS — M25512 Pain in left shoulder: Secondary | ICD-10-CM | POA: Insufficient documentation

## 2018-02-28 DIAGNOSIS — W19XXXA Unspecified fall, initial encounter: Secondary | ICD-10-CM

## 2018-02-28 DIAGNOSIS — I10 Essential (primary) hypertension: Secondary | ICD-10-CM | POA: Insufficient documentation

## 2018-02-28 DIAGNOSIS — M25552 Pain in left hip: Secondary | ICD-10-CM | POA: Diagnosis not present

## 2018-02-28 DIAGNOSIS — M25532 Pain in left wrist: Secondary | ICD-10-CM | POA: Insufficient documentation

## 2018-02-28 DIAGNOSIS — I2510 Atherosclerotic heart disease of native coronary artery without angina pectoris: Secondary | ICD-10-CM | POA: Diagnosis not present

## 2018-02-28 DIAGNOSIS — Z87891 Personal history of nicotine dependence: Secondary | ICD-10-CM | POA: Diagnosis not present

## 2018-02-28 DIAGNOSIS — G939 Disorder of brain, unspecified: Secondary | ICD-10-CM | POA: Diagnosis not present

## 2018-02-28 DIAGNOSIS — D329 Benign neoplasm of meninges, unspecified: Secondary | ICD-10-CM

## 2018-02-28 DIAGNOSIS — S199XXA Unspecified injury of neck, initial encounter: Secondary | ICD-10-CM | POA: Diagnosis not present

## 2018-02-28 DIAGNOSIS — S59902A Unspecified injury of left elbow, initial encounter: Secondary | ICD-10-CM | POA: Diagnosis not present

## 2018-02-28 LAB — BASIC METABOLIC PANEL
Anion gap: 8 (ref 5–15)
BUN: 24 mg/dL — AB (ref 8–23)
CO2: 26 mmol/L (ref 22–32)
Calcium: 9 mg/dL (ref 8.9–10.3)
Chloride: 106 mmol/L (ref 98–111)
Creatinine, Ser: 1.16 mg/dL (ref 0.61–1.24)
GFR calc Af Amer: >60 mL/min (ref >60)
GFR, EST NON AFRICAN AMERICAN: 58 mL/min — AB (ref >60)
Glucose, Bld: 146 mg/dL — ABNORMAL HIGH (ref 70–99)
Potassium: 3.5 mmol/L (ref 3.5–5.1)
Sodium: 140 mmol/L (ref 135–145)

## 2018-02-28 LAB — CBC WITH DIFFERENTIAL/PLATELET
Abs Immature Granulocytes: 0.03 10*3/uL (ref 0.00–0.07)
Basophils Absolute: 0.1 10*3/uL (ref 0.0–0.1)
Basophils Relative: 1 %
Eosinophils Absolute: 0.2 10*3/uL (ref 0.0–0.5)
Eosinophils Relative: 2 %
HCT: 40.8 % (ref 39.0–52.0)
Hemoglobin: 14.4 g/dL (ref 13.0–17.0)
Immature Granulocytes: 0 %
LYMPHS PCT: 16 %
Lymphs Abs: 1.5 10*3/uL (ref 0.7–4.0)
MCH: 35.1 pg — ABNORMAL HIGH (ref 26.0–34.0)
MCHC: 35.3 g/dL (ref 30.0–36.0)
MCV: 99.5 fL (ref 80.0–100.0)
Monocytes Absolute: 0.6 10*3/uL (ref 0.1–1.0)
Monocytes Relative: 6 %
Neutro Abs: 7.3 10*3/uL (ref 1.7–7.7)
Neutrophils Relative %: 75 %
PLATELETS: 187 10*3/uL (ref 150–400)
RBC: 4.1 MIL/uL — AB (ref 4.22–5.81)
RDW: 13 % (ref 11.5–15.5)
WBC: 9.6 10*3/uL (ref 4.0–10.5)
nRBC: 0 % (ref 0.0–0.2)

## 2018-02-28 MED ORDER — TETANUS-DIPHTH-ACELL PERTUSSIS 5-2.5-18.5 LF-MCG/0.5 IM SUSP
0.5000 mL | Freq: Once | INTRAMUSCULAR | Status: AC
  Administered 2018-02-28: 0.5 mL via INTRAMUSCULAR
  Filled 2018-02-28: qty 0.5

## 2018-02-28 MED ORDER — IOPAMIDOL (ISOVUE-370) INJECTION 76%
100.0000 mL | Freq: Once | INTRAVENOUS | Status: AC | PRN
  Administered 2018-02-28: 125 mL via INTRAVENOUS

## 2018-02-28 MED ORDER — GADOBUTROL 1 MMOL/ML IV SOLN
10.0000 mL | Freq: Once | INTRAVENOUS | Status: AC | PRN
  Administered 2018-02-28: 10 mL via INTRAVENOUS

## 2018-02-28 MED ORDER — IOPAMIDOL (ISOVUE-370) INJECTION 76%
INTRAVENOUS | Status: AC
  Filled 2018-02-28: qty 50

## 2018-02-28 NOTE — ED Provider Notes (Signed)
Received signout at the beginning of shift.  Patient had a mechanical fall when he fell off his scooter today.  Initial head CT scan showed findings concerning for potential subacute subdural hematoma.  Patient also had recent stenting and currently on anticoagulant.  Both cardiology and neurosurgery was consulted.  A brain MRI was obtained showing the mass to be typical feature of meningioma and no evidence of head bleed.  Neurosurgeon, Dr. Vertell Limber, have evaluated the MRI and recommend patient to follow-up outpatient in his office for further discussion.  Patient to continue with anticoagulant.  He is otherwise at baseline and stable for discharge.  BP 121/64   Pulse 85   Temp 98.1 F (36.7 C) (Oral)   Resp 15   SpO2 90%   Results for orders placed or performed during the hospital encounter of 02/28/18  CBC with Differential  Result Value Ref Range   WBC 9.6 4.0 - 10.5 K/uL   RBC 4.10 (L) 4.22 - 5.81 MIL/uL   Hemoglobin 14.4 13.0 - 17.0 g/dL   HCT 40.8 39.0 - 52.0 %   MCV 99.5 80.0 - 100.0 fL   MCH 35.1 (H) 26.0 - 34.0 pg   MCHC 35.3 30.0 - 36.0 g/dL   RDW 13.0 11.5 - 15.5 %   Platelets 187 150 - 400 K/uL   nRBC 0.0 0.0 - 0.2 %   Neutrophils Relative % 75 %   Neutro Abs 7.3 1.7 - 7.7 K/uL   Lymphocytes Relative 16 %   Lymphs Abs 1.5 0.7 - 4.0 K/uL   Monocytes Relative 6 %   Monocytes Absolute 0.6 0.1 - 1.0 K/uL   Eosinophils Relative 2 %   Eosinophils Absolute 0.2 0.0 - 0.5 K/uL   Basophils Relative 1 %   Basophils Absolute 0.1 0.0 - 0.1 K/uL   Immature Granulocytes 0 %   Abs Immature Granulocytes 0.03 0.00 - 0.07 K/uL  Basic metabolic panel  Result Value Ref Range   Sodium 140 135 - 145 mmol/L   Potassium 3.5 3.5 - 5.1 mmol/L   Chloride 106 98 - 111 mmol/L   CO2 26 22 - 32 mmol/L   Glucose, Bld 146 (H) 70 - 99 mg/dL   BUN 24 (H) 8 - 23 mg/dL   Creatinine, Ser 1.16 0.61 - 1.24 mg/dL   Calcium 9.0 8.9 - 10.3 mg/dL   GFR calc non Af Amer 58 (L) >60 mL/min   GFR calc Af Amer  >60 >60 mL/min   Anion gap 8 5 - 15   Dg Elbow Complete Left  Result Date: 02/28/2018 CLINICAL DATA:  [place]. EXAM: LEFT ELBOW - COMPLETE 3+ VIEW COMPARISON:  None. FINDINGS: The joint spaces are maintained. No acute fracture is identified. No joint effusion. IMPRESSION: No acute bony findings or joint effusion. Minimal degenerative changes. Electronically Signed   By: Marijo Sanes M.D.   On: 02/28/2018 15:24   Dg Wrist Complete Left  Result Date: 02/28/2018 CLINICAL DATA:  [place].  Left wrist pain. EXAM: LEFT WRIST - COMPLETE 3+ VIEW COMPARISON:  None. FINDINGS: The joint spaces are maintained. No significant degenerative changes. No acute fracture. Mild chondrocalcinosis noted. IMPRESSION: No acute wrist fracture. Electronically Signed   By: Marijo Sanes M.D.   On: 02/28/2018 15:26   Ct Head Wo Contrast  Result Date: 02/28/2018 CLINICAL DATA:  Tipped over in motorized wheelchair landing on left side. EXAM: CT HEAD WITHOUT CONTRAST CT CERVICAL SPINE WITHOUT CONTRAST TECHNIQUE: Multidetector CT imaging of the head and cervical  spine was performed following the standard protocol without intravenous contrast. Multiplanar CT image reconstructions of the cervical spine were also generated. COMPARISON:  None. FINDINGS: CT HEAD FINDINGS Brain: Ventricles and cisterns are within normal. There is an isodense lentiform masslike extra-axial density over the right frontotemporal region measuring 7 x 3.3 cm in AP and transverse dimension. There is a distinct plane of CSF density between this extra-axial collection and adjacent cortex. There are a couple small rounded more hyperdense foci along the medial periphery of this extra-axial collection which may represent acute hemorrhagic foci as the larger measures 1 cm. There is mass effect on the adjacent cortex with minimal midline shift to the left of 6 mm. Subtle chronic ischemic microvascular disease. Vascular: No hyperdense vessel or unexpected calcification. Skull:  No evidence of fracture. Sinuses/Orbits: Normal. Other: No significant soft tissue abnormality. CT CERVICAL SPINE FINDINGS Alignment: Normal. Skull base and vertebrae: Vertebral body heights are maintained. There is mild spondylosis throughout the cervical spine. Atlantoaxial articulation is within normal. There is uncovertebral joint spurring and facet arthropathy. Fusion of the right C3-4 facet joint. Mild bilateral neural from narrowing from the C3-4 level to the C5-6 level. No acute fracture or subluxation. Soft tissues and spinal canal: No prevertebral fluid or swelling. No visible canal hematoma. Disc levels:  Disc space narrowing at the C3-4 level. Upper chest: Mild emphysematous disease over the lung apices. Couple subcentimeter hypodense nodules over the right lobe of the thyroid. Other: None. IMPRESSION: Masslike lentiform isodense extra-axial collection over the right frontotemporal region measuring 7.0 x 3.3 cm in AP and transverse dimension. Couple small more hyperdense nodular foci along the medial aspect of this collection suggesting hemorrhagic foci. There is mild mass effect on the adjacent cortex although a plane of CSF is maintained. Midline shift 6 mm to the left. This appears to be more typical of a subacute to chronic process and may represent a subacute epidural hematoma versus extra-axial mass such as a meningioma. Recommend clinical correlation as MRI with without contrast may be helpful for further evaluation. No acute cervical spine injury. Minimal chronic ischemic microvascular disease. Mild spondylosis of the cervical spine. Disc disease at the C3-4 level. These results were called by telephone at the time of interpretation on 02/28/2018 at 3:17 pm to Dr. Aletta Edouard, who verbally acknowledged these results. Electronically Signed   By: Marin Olp M.D.   On: 02/28/2018 15:17   Ct Cervical Spine Wo Contrast  Result Date: 02/28/2018 CLINICAL DATA:  Tipped over in motorized  wheelchair landing on left side. EXAM: CT HEAD WITHOUT CONTRAST CT CERVICAL SPINE WITHOUT CONTRAST TECHNIQUE: Multidetector CT imaging of the head and cervical spine was performed following the standard protocol without intravenous contrast. Multiplanar CT image reconstructions of the cervical spine were also generated. COMPARISON:  None. FINDINGS: CT HEAD FINDINGS Brain: Ventricles and cisterns are within normal. There is an isodense lentiform masslike extra-axial density over the right frontotemporal region measuring 7 x 3.3 cm in AP and transverse dimension. There is a distinct plane of CSF density between this extra-axial collection and adjacent cortex. There are a couple small rounded more hyperdense foci along the medial periphery of this extra-axial collection which may represent acute hemorrhagic foci as the larger measures 1 cm. There is mass effect on the adjacent cortex with minimal midline shift to the left of 6 mm. Subtle chronic ischemic microvascular disease. Vascular: No hyperdense vessel or unexpected calcification. Skull: No evidence of fracture. Sinuses/Orbits: Normal. Other: No significant soft  tissue abnormality. CT CERVICAL SPINE FINDINGS Alignment: Normal. Skull base and vertebrae: Vertebral body heights are maintained. There is mild spondylosis throughout the cervical spine. Atlantoaxial articulation is within normal. There is uncovertebral joint spurring and facet arthropathy. Fusion of the right C3-4 facet joint. Mild bilateral neural from narrowing from the C3-4 level to the C5-6 level. No acute fracture or subluxation. Soft tissues and spinal canal: No prevertebral fluid or swelling. No visible canal hematoma. Disc levels:  Disc space narrowing at the C3-4 level. Upper chest: Mild emphysematous disease over the lung apices. Couple subcentimeter hypodense nodules over the right lobe of the thyroid. Other: None. IMPRESSION: Masslike lentiform isodense extra-axial collection over the right  frontotemporal region measuring 7.0 x 3.3 cm in AP and transverse dimension. Couple small more hyperdense nodular foci along the medial aspect of this collection suggesting hemorrhagic foci. There is mild mass effect on the adjacent cortex although a plane of CSF is maintained. Midline shift 6 mm to the left. This appears to be more typical of a subacute to chronic process and may represent a subacute epidural hematoma versus extra-axial mass such as a meningioma. Recommend clinical correlation as MRI with without contrast may be helpful for further evaluation. No acute cervical spine injury. Minimal chronic ischemic microvascular disease. Mild spondylosis of the cervical spine. Disc disease at the C3-4 level. These results were called by telephone at the time of interpretation on 02/28/2018 at 3:17 pm to Dr. Aletta Edouard, who verbally acknowledged these results. Electronically Signed   By: Marin Olp M.D.   On: 02/28/2018 15:17   Mr Brain W And Wo Contrast  Result Date: 02/28/2018 CLINICAL DATA:  85 y/o  M; posttraumatic headache. EXAM: MRI HEAD WITHOUT AND WITH CONTRAST TECHNIQUE: Multiplanar, multiecho pulse sequences of the brain and surrounding structures were obtained without and with intravenous contrast. CONTRAST:  10 cc Gadavist COMPARISON:  02/28/2018 CT head. FINDINGS: Brain: Right lateral frontotemporal extra-axial mass with avid enhancement measuring 7.1 x 3.7 x 6.3 cm (AP x ML x CC series 11, image 25 and series 12, image 18). The mass is predominantly mildly T1 hypointense and T2 hyperintense. There is a central vascular pedicle. Small nodular foci of increased attenuation on CT demonstrate increased T1 and decreased T2 signal as well as susceptibility blooming compatible with intratumoral hemorrhage. At the margins of the tumor there are multiple irregularly rim enhancing cystic structures which are contiguous with the surface of the brain (series 12, image 14 and 20). No edema within the  adjacent brain. Diffuse smooth dural thickening over the right cerebral convexity "dural tail". Mass effect on the brain partially effaces sulci and the right lateral ventricle as well as results in 7 mm of right-to-left midline shift. Punctate nonspecific T2 FLAIR hyperintensities in subcortical and periventricular white matter are compatible with mild chronic microvascular ischemic changes. Mild volume loss of the brain. No extra-axial collection, hydrocephalus, or herniation. No findings of stroke or acute hemorrhage. Vascular: Normal flow voids. Skull and upper cervical spine: Normal marrow signal. Sinuses/Orbits: Negative. Other: Bilateral intra-ocular lens replacement. IMPRESSION: 1. Right lateral frontotemporal extra-axial mass measuring up to 7.1 cm with typical features of meningioma. Differential of metastasis or hemangiopericytoma are less likely. There is a irregular peripheral multi cystic component that is in contact with the surface of the brain, however, there is no appreciable invasion or edema of the adjacent brain. 2. Mass effect results in displacement of the adjacent brain, partial effacement of right lateral ventricle, and 7 mm  right-to-left midline shift. 3. Background of mild chronic microvascular ischemic changes and volume loss of the brain. Electronically Signed   By: Kristine Garbe M.D.   On: 02/28/2018 20:01   Ct Coronary Morph W/cta Cor W/score W/ca W/cm &/or Wo/cm  Result Date: 02/28/2018 EXAM: OVER-READ INTERPRETATION  CT CHEST The following report is an over-read performed by radiologist Dr. Vinnie Langton of [facility], [place] on 02/28/2018. This over-read does not include interpretation of cardiac or coronary anatomy or pathology. The coronary calcium score/coronary CTA interpretation by the cardiologist is attached. COMPARISON:  Chest CT 03/27/2011. FINDINGS: Extracardiac findings will be described separately under dictation for contemporaneously obtained CTA  chest, abdomen and pelvis. IMPRESSION: Please see separate dictation for contemporaneously obtained CTA chest, abdomen and pelvis 02/28/2018 for full description of relevant extracardiac findings. Electronically Signed   By: Vinnie Langton M.D.   On: 02/28/2018 15:38   Dg Shoulder Left  Result Date: 02/28/2018 CLINICAL DATA:  [place].  Left shoulder pain. EXAM: LEFT SHOULDER - 2+ VIEW COMPARISON:  None. FINDINGS: The joint spaces are maintained. No acute bony findings or bone lesion. No abnormal soft tissue calcifications. The visualized lung is clear and the visualized ribs are intact. IMPRESSION: No fracture or dislocation. Electronically Signed   By: Marijo Sanes M.D.   On: 02/28/2018 15:23   Ct Angio Chest Aorta W &/or Wo Contrast  Result Date: 02/28/2018 CLINICAL DATA:  85 year old male with history of severe aortic stenosis. Preprocedural study prior to potential transcatheter aortic valve replacement (TAVR) procedure. EXAM: CT ANGIOGRAPHY CHEST, ABDOMEN AND PELVIS TECHNIQUE: Multidetector CT imaging through the chest, abdomen and pelvis was performed using the standard protocol during bolus administration of intravenous contrast. Multiplanar reconstructed images and MIPs were obtained and reviewed to evaluate the vascular anatomy. CONTRAST:  156mL ISOVUE-370 IOPAMIDOL (ISOVUE-370) INJECTION 76% COMPARISON:  Chest CT 03/27/2011. FINDINGS: CTA CHEST FINDINGS Cardiovascular: Heart size is mildly enlarged. There is no significant pericardial fluid, thickening or pericardial calcification. There is aortic atherosclerosis, as well as atherosclerosis of the great vessels of the mediastinum and the coronary arteries, including calcified atherosclerotic plaque in the left main, left anterior descending, left circumflex and right coronary arteries. Severe thickening calcification of the aortic valve. 1.5 cm filling defect in the inferior aspect of the left atrium associated with the inferior interatrial septum,  suspicious for small myxoma, or less likely a thrombus. Mediastinum/Lymph Nodes: No pathologically enlarged mediastinal or hilar lymph nodes. Moderate-sized hiatal hernia. Patulous esophagus. No axillary lymphadenopathy. Lungs/Pleura: Mild diffuse bronchial wall thickening with mild to moderate centrilobular and paraseptal emphysema. No acute consolidative airspace disease. No pleural effusions. No suspicious appearing pulmonary nodules or masses are noted. Musculoskeletal/Soft Tissues: There are no aggressive appearing lytic or blastic lesions noted in the visualized portions of the skeleton. CTA ABDOMEN AND PELVIS FINDINGS Hepatobiliary: Low-attenuation lesion in between segments 4A and 8 (axial image 80 of series 6) measuring 2.7 x 2.4 cm, compatible with a cyst. No suspicious hepatic lesions are otherwise noted. No intra or extrahepatic biliary ductal dilatation. Gallbladder is normal in appearance. Pancreas: No definite pancreatic mass. There is diffuse pancreatic ductal dilatation, most evident in the head of the pancreas where the main pancreatic duct measures up to 8 mm shortly before the ampulla. No pancreatic or peripancreatic fluid collections or inflammatory changes. Spleen: Unremarkable. Adrenals/Urinary Tract: 2.1 cm low-attenuation lesion in the upper pole of the left kidney, compatible with a simple cyst. Right kidney and bilateral adrenal glands are normal in appearance. No  hydroureteronephrosis. Urinary bladder is normal in appearance. Stomach/Bowel: Normal appearance of the stomach. No pathologic dilatation of small bowel or colon. The appendix is not confidently identified and may be surgically absent. Regardless, there are no inflammatory changes noted adjacent to the cecum to suggest the presence of an acute appendicitis at this time. Vascular/Lymphatic: Aortic atherosclerosis with vascular findings and measurements pertinent to potential TAVR procedure, as detailed below. Fusiform infrarenal  abdominal aortic aneurysm measuring up to 4.5 x 4.9 cm (axial image 121 of series 6). 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 No lymphadenopathy noted in the abdomen or pelvis. Reproductive: Prostate gland and seminal vesicles are unremarkable in appearance. Other: No significant volume of ascites.  No pneumoperitoneum. Musculoskeletal: Status post PLIF at L4-L5 with interbody grafts at L4-L5 interspace. There are no aggressive appearing lytic or blastic lesions noted in the visualized portions of the skeleton. VASCULAR MEASUREMENTS PERTINENT TO TAVR: AORTA: Minimal Aortic Diameter-21 x 18 mm Severity of Aortic Calcification-moderate RIGHT PELVIS: Right Common Iliac Artery - Minimal Diameter-11.6 x 10.0 mm Tortuosity-mild Calcification-moderate Right External Iliac Artery - Minimal Diameter-8.7 x 8.1 mm Tortuosity-moderate Calcification-mild Right Common Femoral Artery - Minimal Diameter-8.6 x 7.7 mm Tortuosity-mild Calcification-moderate LEFT PELVIS: Left Common Iliac Artery - Minimal Diameter-9.5 x 8.7 mm Tortuosity-mild Calcification-moderate Left External Iliac Artery - Minimal Diameter-9.6 x 9.2 mm Tortuosity-moderate Calcification-mild Left Common Femoral Artery - Minimal Diameter-8.1 x 6.9 mm Tortuosity-mild Calcification-moderate Review of the MIP images confirms the above findings. IMPRESSION: 1. Vascular findings and measurements pertinent to potential TAVR procedure, as detailed above. 2. Severe thickening calcification of the aortic valve, compatible with the reported clinical history of severe aortic stenosis. 3. 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 An interatrial thrombus is also possible, but is considered less likely. 4. Aortic atherosclerosis, in addition to left main and 3 vessel coronary artery disease. 5. Diffuse bronchial  wall thickening with mild to moderate centrilobular and paraseptal emphysema; imaging findings suggestive of underlying COPD. 6. Dilatation of the main pancreatic duct, most prominent in the region of the head of the pancreas. The possibility of main branch intraductal papillary mucinous neoplasm (IPMN) should be considered, although these findings are favored to simply be age related. Further evaluation with nonemergent MRI of the abdomen with and without IV gadolinium with MRCP is suggested. 7. Additional incidental findings, as above. Electronically Signed   By: Vinnie Langton M.D.   On: 02/28/2018 16:29   Dg Hip Unilat W Or Wo Pelvis 2-3 Views Left  Result Date: 02/28/2018 CLINICAL DATA:  [place].  Left hip pain. EXAM: DG HIP (WITH OR WITHOUT PELVIS) 2-3V LEFT COMPARISON:  None. FINDINGS: Both hips are normally located. Mild degenerative changes but no acute fracture or AVN. The pubic symphysis and SI joints are intact. Lower lumbar fusion hardware noted. Contrast noted in the bladder from a prior CT scan. IMPRESSION: No acute bony findings. Electronically Signed   By: Marijo Sanes M.D.   On: 02/28/2018 15:27   Ct Angio Abdomen Pelvis  W &/or Wo Contrast  Result Date: 02/28/2018 CLINICAL DATA:  85 year old male with history of severe aortic stenosis. Preprocedural study prior to potential transcatheter aortic valve replacement (TAVR) procedure. EXAM: CT ANGIOGRAPHY CHEST, ABDOMEN AND PELVIS TECHNIQUE: Multidetector CT imaging through the chest, abdomen and pelvis was performed using the standard protocol during bolus administration of  intravenous contrast. Multiplanar reconstructed images and MIPs were obtained and reviewed to evaluate the vascular anatomy. CONTRAST:  160mL ISOVUE-370 IOPAMIDOL (ISOVUE-370) INJECTION 76% COMPARISON:  Chest CT 03/27/2011. FINDINGS: CTA CHEST FINDINGS Cardiovascular: Heart size is mildly enlarged. There is no significant pericardial fluid, thickening or pericardial  calcification. There is aortic atherosclerosis, as well as atherosclerosis of the great vessels of the mediastinum and the coronary arteries, including calcified atherosclerotic plaque in the left main, left anterior descending, left circumflex and right coronary arteries. Severe thickening calcification of the aortic valve. 1.5 cm filling defect in the inferior aspect of the left atrium associated with the inferior interatrial septum, suspicious for small myxoma, or less likely a thrombus. Mediastinum/Lymph Nodes: No pathologically enlarged mediastinal or hilar lymph nodes. Moderate-sized hiatal hernia. Patulous esophagus. No axillary lymphadenopathy. Lungs/Pleura: Mild diffuse bronchial wall thickening with mild to moderate centrilobular and paraseptal emphysema. No acute consolidative airspace disease. No pleural effusions. No suspicious appearing pulmonary nodules or masses are noted. Musculoskeletal/Soft Tissues: There are no aggressive appearing lytic or blastic lesions noted in the visualized portions of the skeleton. CTA ABDOMEN AND PELVIS FINDINGS Hepatobiliary: Low-attenuation lesion in between segments 4A and 8 (axial image 80 of series 6) measuring 2.7 x 2.4 cm, compatible with a cyst. No suspicious hepatic lesions are otherwise noted. No intra or extrahepatic biliary ductal dilatation. Gallbladder is normal in appearance. Pancreas: No definite pancreatic mass. There is diffuse pancreatic ductal dilatation, most evident in the head of the pancreas where the main pancreatic duct measures up to 8 mm shortly before the ampulla. No pancreatic or peripancreatic fluid collections or inflammatory changes. Spleen: Unremarkable. Adrenals/Urinary Tract: 2.1 cm low-attenuation lesion in the upper pole of the left kidney, compatible with a simple cyst. Right kidney and bilateral adrenal glands are normal in appearance. No hydroureteronephrosis. Urinary bladder is normal in appearance. Stomach/Bowel: Normal  appearance of the stomach. No pathologic dilatation of small bowel or colon. The appendix is not confidently identified and may be surgically absent. Regardless, there are no inflammatory changes noted adjacent to the cecum to suggest the presence of an acute appendicitis at this time. Vascular/Lymphatic: Aortic atherosclerosis with vascular findings and measurements pertinent to potential TAVR procedure, as detailed below. Fusiform infrarenal abdominal aortic aneurysm measuring up to 4.5 x 4.9 cm (axial image 121 of series 6). 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 No lymphadenopathy noted in the abdomen or pelvis. Reproductive: Prostate gland and seminal vesicles are unremarkable in appearance. Other: No significant volume of ascites.  No pneumoperitoneum. Musculoskeletal: Status post PLIF at L4-L5 with interbody grafts at L4-L5 interspace. There are no aggressive appearing lytic or blastic lesions noted in the visualized portions of the skeleton. VASCULAR MEASUREMENTS PERTINENT TO TAVR: AORTA: Minimal Aortic Diameter-21 x 18 mm Severity of Aortic Calcification-moderate RIGHT PELVIS: Right Common Iliac Artery - Minimal Diameter-11.6 x 10.0 mm Tortuosity-mild Calcification-moderate Right External Iliac Artery - Minimal Diameter-8.7 x 8.1 mm Tortuosity-moderate Calcification-mild Right Common Femoral Artery - Minimal Diameter-8.6 x 7.7 mm Tortuosity-mild Calcification-moderate LEFT PELVIS: Left Common Iliac Artery - Minimal Diameter-9.5 x 8.7 mm Tortuosity-mild Calcification-moderate Left External Iliac Artery - Minimal Diameter-9.6 x 9.2 mm Tortuosity-moderate Calcification-mild Left Common Femoral Artery - Minimal Diameter-8.1 x 6.9 mm Tortuosity-mild Calcification-moderate Review of the MIP images confirms the above findings. IMPRESSION: 1. Vascular findings and measurements pertinent to potential TAVR procedure, as detailed above. 2. Severe  thickening calcification of the aortic valve, compatible with the  reported clinical history of severe aortic stenosis. 3. 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 An interatrial thrombus is also possible, but is considered less likely. 4. Aortic atherosclerosis, in addition to left main and 3 vessel coronary artery disease. 5. Diffuse bronchial wall thickening with mild to moderate centrilobular and paraseptal emphysema; imaging findings suggestive of underlying COPD. 6. Dilatation of the main pancreatic duct, most prominent in the region of the head of the pancreas. The possibility of main branch intraductal papillary mucinous neoplasm (IPMN) should be considered, although these findings are favored to simply be age related. Further evaluation with nonemergent MRI of the abdomen with and without IV gadolinium with MRCP is suggested. 7. Additional incidental findings, as above. Electronically Signed   By: Vinnie Langton M.D.   On: 02/28/2018 16:29      Domenic Moras, PA-C 02/28/18 2040    Duffy Bruce, MD 03/01/18 [id number]
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
## 2018-02-28 NOTE — Consult Note (Signed)
Reason for Consult:Brain mass Referring Physician: [name] is an 85 y.o. male.  HPI: [name] is a 85 y.o. male with a history of CAD s/p recent PCI with DES to LAD on 02/19/2018, HTN, HLD, sleep apnea, GERD, Parkinson's disease, chronic diastolic CHF,infrarenal abdominal aortic aneurysm and severe aortic stenosis, who presented to the [facility] ED after a mechanical fall.  A head CT was suggestive of a lenticular right fronto-temporal mass, which could either represent an iso-dense epidural hematoma or brain tumor, such as a meningioma.  We favored meningioma, given benign nature of neurologic exam and CSF cleft around mass, which favors a meningioma (extra-axial tumor) and not an acute hemorrhage.  We recommended an MRI brain to clarify this finding.  MRI was consistent with a meningioma.  There is not a significant amount of peri-tumoral edema.  There is 6 mm of right to left shift.  There are areas of focal high signal on CT, which were felt to be consistent with hemorrhage, but on review of MRI, I believe these are consistent with areas of calcification within the tumor.  Past Medical History:  Diagnosis Date  . Arthritis   . Cancer [facility])    h/o  skin  cancer  . Coronary artery disease   . Ex-smoker   . GERD (gastroesophageal reflux disease)   . Hypercholesteremia   . Hypertension   . Lumbar stenosis   . OSA (obstructive sleep apnea)   . Parkinson's disease ([place])   . Severe aortic stenosis     Past Surgical History:  Procedure Laterality Date  . APPENDECTOMY    . BACK SURGERY  11/2013  . COLONOSCOPY  11/17/2008   Rourk: Sessile polyp in the cecum status post saline-assisted piecemeal polypectomy, resolution clipping and epinephrine injection therapy performed. Shallow sigmoid diverticula.. Tubular adenoma  . COLONOSCOPY  11/19/2008   Fields: Large amount of liquid blood with clots seen throughout the colon. Previous [place] resolution clip remained in  place. Purple spot seen in the lateral aspect polypectomy site, 3 resolution clips placed here. 6 mm sessile ascending colon polyp seen and not manipulated.  . COLONOSCOPY  11/25/2008   Rourk: Blood-tinged colonic effluent, suspect recurrent post polypectomy bleeding status post placement of 3 clips on the polypectomy site on top of the 3 clips that already existed, one had fallen off since last procedure. One small polyp distal to the cecum not manipulated  . COLONOSCOPY  12/06/2009   Rourk: Internal hemorrhoids, scattered left-sided diverticula, cecal polyps, one snared and subsequently clipped, 2 adjacent diminutive polyps ablated. tubular adenoma  . COLONOSCOPY N/A 01/21/2015   Dr. Gala Romney: 1.5 cm carpet-type polyp just distal to the ileocecal valve removed piecemeal fashion and ablated. Small polyp in the rectum. Pathology-tubular adenomas  . COLONOSCOPY N/A 09/01/2015   Procedure: COLONOSCOPY;  Surgeon: Daneil Dolin, MD;  [facility];  Service: Endoscopy;  Laterality: N/A;  845  . CORONARY STENT INTERVENTION N/A 02/19/2018   Procedure: CORONARY STENT INTERVENTION;  Surgeon: [name], MD;  Location: [facility];  Service: Cardiovascular;  Laterality: N/A;  . EYE SURGERY     bilateral  cataracts  . INTRAVASCULAR PRESSURE WIRE/FFR STUDY N/A 02/19/2018   Procedure: INTRAVASCULAR PRESSURE WIRE/FFR STUDY;  Surgeon: [name], MD;  Location: [facility];  Service: Cardiovascular;  Laterality: N/A;  . MENISCUS REPAIR     right knee  2000  . POLYPECTOMY  09/01/2015   Procedure: POLYPECTOMY;  Surgeon:  Daneil Dolin, MD;  [facility]: [facility];  Service: Endoscopy;;  colon  . RIGHT/LEFT HEART CATH AND CORONARY ANGIOGRAPHY N/A 02/19/2018   Procedure: RIGHT/LEFT HEART CATH AND CORONARY ANGIOGRAPHY;  Surgeon: [name], MD;  Location: [facility];  Service: Cardiovascular;  Laterality: N/A;  . TONSILLECTOMY Bilateral     Family History  Problem Relation  Age of Onset  . Dementia Mother   . Cancer Brother   . Stroke Brother     Social History:  reports that he quit smoking about 29 years ago. His smoking use included cigarettes and pipe. He started smoking about 67 years ago. He smoked 1.00 pack per day. He has never used smokeless tobacco. He reports that he does not drink alcohol or use drugs.  Allergies: No Known Allergies  Medications: I have reviewed the patient's current medications.  Results for orders placed or performed during the hospital encounter of 02/28/18 (from the past 48 hour(s))  CBC with Differential     Status: Abnormal   Collection Time: 02/28/18  1:10 PM  Result Value Ref Range   WBC 9.6 4.0 - 10.5 K/uL   RBC 4.10 (L) 4.22 - 5.81 MIL/uL   Hemoglobin 14.4 13.0 - 17.0 g/dL   HCT 40.8 39.0 - 52.0 %   MCV 99.5 80.0 - 100.0 fL   MCH 35.1 (H) 26.0 - 34.0 pg   MCHC 35.3 30.0 - 36.0 g/dL   RDW 13.0 11.5 - 15.5 %   Platelets 187 150 - 400 K/uL   nRBC 0.0 0.0 - 0.2 %   Neutrophils Relative % 75 %   Neutro Abs 7.3 1.7 - 7.7 K/uL   Lymphocytes Relative 16 %   Lymphs Abs 1.5 0.7 - 4.0 K/uL   Monocytes Relative 6 %   Monocytes Absolute 0.6 0.1 - 1.0 K/uL   Eosinophils Relative 2 %   Eosinophils Absolute 0.2 0.0 - 0.5 K/uL   Basophils Relative 1 %   Basophils Absolute 0.1 0.0 - 0.1 K/uL   Immature Granulocytes 0 %   Abs Immature Granulocytes 0.03 0.00 - 0.07 K/uL    Comment: Performed at [facility], [address], [place], [place] [address]  Basic metabolic panel     Status: Abnormal   Collection Time: 02/28/18  1:10 PM  Result Value Ref Range   Sodium 140 135 - 145 mmol/L   Potassium 3.5 3.5 - 5.1 mmol/L   Chloride 106 98 - 111 mmol/L   CO2 26 22 - 32 mmol/L   Glucose, Bld 146 (H) 70 - 99 mg/dL   BUN 24 (H) 8 - 23 mg/dL   Creatinine, Ser 1.16 0.61 - 1.24 mg/dL   Calcium 9.0 8.9 - 10.3 mg/dL   GFR calc non Af Amer 58 (L) >60 mL/min   GFR calc Af Amer >60 >60 mL/min   Anion gap 8 5 - 15    Comment:  Performed at [facility], [place] [address], [place], [place] [address]    Dg Elbow Complete Left  Result Date: 02/28/2018 CLINICAL DATA:  [place]. EXAM: LEFT ELBOW - COMPLETE 3+ VIEW COMPARISON:  None. FINDINGS: The joint spaces are maintained. No acute fracture is identified. No joint effusion. IMPRESSION: No acute bony findings or joint effusion. Minimal degenerative changes. Electronically Signed   By: Marijo Sanes M.D.   On: 02/28/2018 15:24   Dg Wrist Complete Left  Result Date: 02/28/2018 CLINICAL DATA:  [place].  Left wrist pain. EXAM:  LEFT WRIST - COMPLETE 3+ VIEW COMPARISON:  None. FINDINGS: The joint spaces are maintained. No significant degenerative changes. No acute fracture. Mild chondrocalcinosis noted. IMPRESSION: No acute wrist fracture. Electronically Signed   By: Marijo Sanes M.D.   On: 02/28/2018 15:26   Ct Head Wo Contrast  Result Date: 02/28/2018 CLINICAL DATA:  Tipped over in motorized wheelchair landing on left side. EXAM: CT HEAD WITHOUT CONTRAST CT CERVICAL SPINE WITHOUT CONTRAST TECHNIQUE: Multidetector CT imaging of the head and cervical spine was performed following the standard protocol without intravenous contrast. Multiplanar CT image reconstructions of the cervical spine were also generated. COMPARISON:  None. FINDINGS: CT HEAD FINDINGS Brain: Ventricles and cisterns are within normal. There is an isodense lentiform masslike extra-axial density over the right frontotemporal region measuring 7 x 3.3 cm in AP and transverse dimension. There is a distinct plane of CSF density between this extra-axial collection and adjacent cortex. There are a couple small rounded more hyperdense foci along the medial periphery of this extra-axial collection which may represent acute hemorrhagic foci as the larger measures 1 cm. There is mass effect on the adjacent cortex with minimal midline shift to the left of 6 mm. Subtle chronic ischemic microvascular disease. Vascular: No hyperdense  vessel or unexpected calcification. Skull: No evidence of fracture. Sinuses/Orbits: Normal. Other: No significant soft tissue abnormality. CT CERVICAL SPINE FINDINGS Alignment: Normal. Skull base and vertebrae: Vertebral body heights are maintained. There is mild spondylosis throughout the cervical spine. Atlantoaxial articulation is within normal. There is uncovertebral joint spurring and facet arthropathy. Fusion of the right C3-4 facet joint. Mild bilateral neural from narrowing from the C3-4 level to the C5-6 level. No acute fracture or subluxation. Soft tissues and spinal canal: No prevertebral fluid or swelling. No visible canal hematoma. Disc levels:  Disc space narrowing at the C3-4 level. Upper chest: Mild emphysematous disease over the lung apices. Couple subcentimeter hypodense nodules over the right lobe of the thyroid. Other: None. IMPRESSION: Masslike lentiform isodense extra-axial collection over the right frontotemporal region measuring 7.0 x 3.3 cm in AP and transverse dimension. Couple small more hyperdense nodular foci along the medial aspect of this collection suggesting hemorrhagic foci. There is mild mass effect on the adjacent cortex although a plane of CSF is maintained. Midline shift 6 mm to the left. This appears to be more typical of a subacute to chronic process and may represent a subacute epidural hematoma versus extra-axial mass such as a meningioma. Recommend clinical correlation as MRI with without contrast may be helpful for further evaluation. No acute cervical spine injury. Minimal chronic ischemic microvascular disease. Mild spondylosis of the cervical spine. Disc disease at the C3-4 level. These results were called by telephone at the time of interpretation on 02/28/2018 at 3:17 pm to Dr. Aletta Edouard, who verbally acknowledged these results. Electronically Signed   By: Marin Olp M.D.   On: 02/28/2018 15:17   Ct Cervical Spine Wo Contrast  Result Date:  02/28/2018 CLINICAL DATA:  Tipped over in motorized wheelchair landing on left side. EXAM: CT HEAD WITHOUT CONTRAST CT CERVICAL SPINE WITHOUT CONTRAST TECHNIQUE: Multidetector CT imaging of the head and cervical spine was performed following the standard protocol without intravenous contrast. Multiplanar CT image reconstructions of the cervical spine were also generated. COMPARISON:  None. FINDINGS: CT HEAD FINDINGS Brain: Ventricles and cisterns are within normal. There is an isodense lentiform masslike extra-axial density over the right frontotemporal region measuring 7 x 3.3 cm in AP and transverse dimension.  There is a distinct plane of CSF density between this extra-axial collection and adjacent cortex. There are a couple small rounded more hyperdense foci along the medial periphery of this extra-axial collection which may represent acute hemorrhagic foci as the larger measures 1 cm. There is mass effect on the adjacent cortex with minimal midline shift to the left of 6 mm. Subtle chronic ischemic microvascular disease. Vascular: No hyperdense vessel or unexpected calcification. Skull: No evidence of fracture. Sinuses/Orbits: Normal. Other: No significant soft tissue abnormality. CT CERVICAL SPINE FINDINGS Alignment: Normal. Skull base and vertebrae: Vertebral body heights are maintained. There is mild spondylosis throughout the cervical spine. Atlantoaxial articulation is within normal. There is uncovertebral joint spurring and facet arthropathy. Fusion of the right C3-4 facet joint. Mild bilateral neural from narrowing from the C3-4 level to the C5-6 level. No acute fracture or subluxation. Soft tissues and spinal canal: No prevertebral fluid or swelling. No visible canal hematoma. Disc levels:  Disc space narrowing at the C3-4 level. Upper chest: Mild emphysematous disease over the lung apices. Couple subcentimeter hypodense nodules over the right lobe of the thyroid. Other: None. IMPRESSION: Masslike  lentiform isodense extra-axial collection over the right frontotemporal region measuring 7.0 x 3.3 cm in AP and transverse dimension. Couple small more hyperdense nodular foci along the medial aspect of this collection suggesting hemorrhagic foci. There is mild mass effect on the adjacent cortex although a plane of CSF is maintained. Midline shift 6 mm to the left. This appears to be more typical of a subacute to chronic process and may represent a subacute epidural hematoma versus extra-axial mass such as a meningioma. Recommend clinical correlation as MRI with without contrast may be helpful for further evaluation. No acute cervical spine injury. Minimal chronic ischemic microvascular disease. Mild spondylosis of the cervical spine. Disc disease at the C3-4 level. These results were called by telephone at the time of interpretation on 02/28/2018 at 3:17 pm to Dr. Aletta Edouard, who verbally acknowledged these results. Electronically Signed   By: Marin Olp M.D.   On: 02/28/2018 15:17   Ct Coronary Morph W/cta Cor W/score W/ca W/cm &/or Wo/cm  Result Date: 02/28/2018 EXAM: OVER-READ INTERPRETATION  CT CHEST The following report is an over-read performed by radiologist Dr. Vinnie Langton of [facility], [place] on 02/28/2018. This over-read does not include interpretation of cardiac or coronary anatomy or pathology. The coronary calcium score/coronary CTA interpretation by the cardiologist is attached. COMPARISON:  Chest CT 03/27/2011. FINDINGS: Extracardiac findings will be described separately under dictation for contemporaneously obtained CTA chest, abdomen and pelvis. IMPRESSION: Please see separate dictation for contemporaneously obtained CTA chest, abdomen and pelvis 02/28/2018 for full description of relevant extracardiac findings. Electronically Signed   By: Vinnie Langton M.D.   On: 02/28/2018 15:38   Dg Shoulder Left  Result Date: 02/28/2018 CLINICAL DATA:  [place].  Left shoulder pain. EXAM:  LEFT SHOULDER - 2+ VIEW COMPARISON:  None. FINDINGS: The joint spaces are maintained. No acute bony findings or bone lesion. No abnormal soft tissue calcifications. The visualized lung is clear and the visualized ribs are intact. IMPRESSION: No fracture or dislocation. Electronically Signed   By: Marijo Sanes M.D.   On: 02/28/2018 15:23   Ct Angio Chest Aorta W &/or Wo Contrast  Result Date: 02/28/2018 CLINICAL DATA:  85 year old male with history of severe aortic stenosis. Preprocedural study prior to potential transcatheter aortic valve replacement (TAVR) procedure. EXAM: CT ANGIOGRAPHY CHEST, ABDOMEN AND PELVIS TECHNIQUE: Multidetector CT imaging through  the chest, abdomen and pelvis was performed using the standard protocol during bolus administration of intravenous contrast. Multiplanar reconstructed images and MIPs were obtained and reviewed to evaluate the vascular anatomy. CONTRAST:  155mL ISOVUE-370 IOPAMIDOL (ISOVUE-370) INJECTION 76% COMPARISON:  Chest CT 03/27/2011. FINDINGS: CTA CHEST FINDINGS Cardiovascular: Heart size is mildly enlarged. There is no significant pericardial fluid, thickening or pericardial calcification. There is aortic atherosclerosis, as well as atherosclerosis of the great vessels of the mediastinum and the coronary arteries, including calcified atherosclerotic plaque in the left main, left anterior descending, left circumflex and right coronary arteries. Severe thickening calcification of the aortic valve. 1.5 cm filling defect in the inferior aspect of the left atrium associated with the inferior interatrial septum, suspicious for small myxoma, or less likely a thrombus. Mediastinum/Lymph Nodes: No pathologically enlarged mediastinal or hilar lymph nodes. Moderate-sized hiatal hernia. Patulous esophagus. No axillary lymphadenopathy. Lungs/Pleura: Mild diffuse bronchial wall thickening with mild to moderate centrilobular and paraseptal emphysema. No acute consolidative  airspace disease. No pleural effusions. No suspicious appearing pulmonary nodules or masses are noted. Musculoskeletal/Soft Tissues: There are no aggressive appearing lytic or blastic lesions noted in the visualized portions of the skeleton. CTA ABDOMEN AND PELVIS FINDINGS Hepatobiliary: Low-attenuation lesion in between segments 4A and 8 (axial image 80 of series 6) measuring 2.7 x 2.4 cm, compatible with a cyst. No suspicious hepatic lesions are otherwise noted. No intra or extrahepatic biliary ductal dilatation. Gallbladder is normal in appearance. Pancreas: No definite pancreatic mass. There is diffuse pancreatic ductal dilatation, most evident in the head of the pancreas where the main pancreatic duct measures up to 8 mm shortly before the ampulla. No pancreatic or peripancreatic fluid collections or inflammatory changes. Spleen: Unremarkable. Adrenals/Urinary Tract: 2.1 cm low-attenuation lesion in the upper pole of the left kidney, compatible with a simple cyst. Right kidney and bilateral adrenal glands are normal in appearance. No hydroureteronephrosis. Urinary bladder is normal in appearance. Stomach/Bowel: Normal appearance of the stomach. No pathologic dilatation of small bowel or colon. The appendix is not confidently identified and may be surgically absent. Regardless, there are no inflammatory changes noted adjacent to the cecum to suggest the presence of an acute appendicitis at this time. Vascular/Lymphatic: Aortic atherosclerosis with vascular findings and measurements pertinent to potential TAVR procedure, as detailed below. Fusiform infrarenal abdominal aortic aneurysm measuring up to 4.5 x 4.9 cm (axial image 121 of series 6). 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 No lymphadenopathy noted in the abdomen or pelvis. Reproductive: Prostate gland and seminal vesicles are unremarkable in appearance. Other: No significant volume of  ascites.  No pneumoperitoneum. Musculoskeletal: Status post PLIF at L4-L5 with interbody grafts at L4-L5 interspace. There are no aggressive appearing lytic or blastic lesions noted in the visualized portions of the skeleton. VASCULAR MEASUREMENTS PERTINENT TO TAVR: AORTA: Minimal Aortic Diameter-21 x 18 mm Severity of Aortic Calcification-moderate RIGHT PELVIS: Right Common Iliac Artery - Minimal Diameter-11.6 x 10.0 mm Tortuosity-mild Calcification-moderate Right External Iliac Artery - Minimal Diameter-8.7 x 8.1 mm Tortuosity-moderate Calcification-mild Right Common Femoral Artery - Minimal Diameter-8.6 x 7.7 mm Tortuosity-mild Calcification-moderate LEFT PELVIS: Left Common Iliac Artery - Minimal Diameter-9.5 x 8.7 mm Tortuosity-mild Calcification-moderate Left External Iliac Artery - Minimal Diameter-9.6 x 9.2 mm Tortuosity-moderate Calcification-mild Left Common Femoral Artery - Minimal Diameter-8.1 x 6.9 mm Tortuosity-mild Calcification-moderate Review of the MIP images confirms the above findings. IMPRESSION: 1. Vascular findings and measurements pertinent to potential TAVR procedure,  as detailed above. 2. Severe thickening calcification of the aortic valve, compatible with the reported clinical history of severe aortic stenosis. 3. 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 An interatrial thrombus is also possible, but is considered less likely. 4. Aortic atherosclerosis, in addition to left main and 3 vessel coronary artery disease. 5. Diffuse bronchial wall thickening with mild to moderate centrilobular and paraseptal emphysema; imaging findings suggestive of underlying COPD. 6. Dilatation of the main pancreatic duct, most prominent in the region of the head of the pancreas. The possibility of main branch intraductal papillary mucinous neoplasm (IPMN) should be considered, although these findings are  favored to simply be age related. Further evaluation with nonemergent MRI of the abdomen with and without IV gadolinium with MRCP is suggested. 7. Additional incidental findings, as above. Electronically Signed   By: Vinnie Langton M.D.   On: 02/28/2018 16:29   Dg Hip Unilat W Or Wo Pelvis 2-3 Views Left  Result Date: 02/28/2018 CLINICAL DATA:  [place].  Left hip pain. EXAM: DG HIP (WITH OR WITHOUT PELVIS) 2-3V LEFT COMPARISON:  None. FINDINGS: Both hips are normally located. Mild degenerative changes but no acute fracture or AVN. The pubic symphysis and SI joints are intact. Lower lumbar fusion hardware noted. Contrast noted in the bladder from a prior CT scan. IMPRESSION: No acute bony findings. Electronically Signed   By: Marijo Sanes M.D.   On: 02/28/2018 15:27   Ct Angio Abdomen Pelvis  W &/or Wo Contrast  Result Date: 02/28/2018 CLINICAL DATA:  85 year old male with history of severe aortic stenosis. Preprocedural study prior to potential transcatheter aortic valve replacement (TAVR) procedure. EXAM: CT ANGIOGRAPHY CHEST, ABDOMEN AND PELVIS TECHNIQUE: Multidetector CT imaging through the chest, abdomen and pelvis was performed using the standard protocol during bolus administration of intravenous contrast. Multiplanar reconstructed images and MIPs were obtained and reviewed to evaluate the vascular anatomy. CONTRAST:  161mL ISOVUE-370 IOPAMIDOL (ISOVUE-370) INJECTION 76% COMPARISON:  Chest CT 03/27/2011. FINDINGS: CTA CHEST FINDINGS Cardiovascular: Heart size is mildly enlarged. There is no significant pericardial fluid, thickening or pericardial calcification. There is aortic atherosclerosis, as well as atherosclerosis of the great vessels of the mediastinum and the coronary arteries, including calcified atherosclerotic plaque in the left main, left anterior descending, left circumflex and right coronary arteries. Severe thickening calcification of the aortic valve. 1.5 cm filling defect in the  inferior aspect of the left atrium associated with the inferior interatrial septum, suspicious for small myxoma, or less likely a thrombus. Mediastinum/Lymph Nodes: No pathologically enlarged mediastinal or hilar lymph nodes. Moderate-sized hiatal hernia. Patulous esophagus. No axillary lymphadenopathy. Lungs/Pleura: Mild diffuse bronchial wall thickening with mild to moderate centrilobular and paraseptal emphysema. No acute consolidative airspace disease. No pleural effusions. No suspicious appearing pulmonary nodules or masses are noted. Musculoskeletal/Soft Tissues: There are no aggressive appearing lytic or blastic lesions noted in the visualized portions of the skeleton. CTA ABDOMEN AND PELVIS FINDINGS Hepatobiliary: Low-attenuation lesion in between segments 4A and 8 (axial image 80 of series 6) measuring 2.7 x 2.4 cm, compatible with a cyst. No suspicious hepatic lesions are otherwise noted. No intra or extrahepatic biliary ductal dilatation. Gallbladder is normal in appearance. Pancreas: No definite pancreatic mass. There is diffuse pancreatic ductal dilatation, most evident in the head of the pancreas where the main pancreatic duct measures up to 8 mm shortly before the ampulla. No pancreatic or peripancreatic fluid collections  or inflammatory changes. Spleen: Unremarkable. Adrenals/Urinary Tract: 2.1 cm low-attenuation lesion in the upper pole of the left kidney, compatible with a simple cyst. Right kidney and bilateral adrenal glands are normal in appearance. No hydroureteronephrosis. Urinary bladder is normal in appearance. Stomach/Bowel: Normal appearance of the stomach. No pathologic dilatation of small bowel or colon. The appendix is not confidently identified and may be surgically absent. Regardless, there are no inflammatory changes noted adjacent to the cecum to suggest the presence of an acute appendicitis at this time. Vascular/Lymphatic: Aortic atherosclerosis with vascular findings and  measurements pertinent to potential TAVR procedure, as detailed below. Fusiform infrarenal abdominal aortic aneurysm measuring up to 4.5 x 4.9 cm (axial image 121 of series 6). 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 No lymphadenopathy noted in the abdomen or pelvis. Reproductive: Prostate gland and seminal vesicles are unremarkable in appearance. Other: No significant volume of ascites.  No pneumoperitoneum. Musculoskeletal: Status post PLIF at L4-L5 with interbody grafts at L4-L5 interspace. There are no aggressive appearing lytic or blastic lesions noted in the visualized portions of the skeleton. VASCULAR MEASUREMENTS PERTINENT TO TAVR: AORTA: Minimal Aortic Diameter-21 x 18 mm Severity of Aortic Calcification-moderate RIGHT PELVIS: Right Common Iliac Artery - Minimal Diameter-11.6 x 10.0 mm Tortuosity-mild Calcification-moderate Right External Iliac Artery - Minimal Diameter-8.7 x 8.1 mm Tortuosity-moderate Calcification-mild Right Common Femoral Artery - Minimal Diameter-8.6 x 7.7 mm Tortuosity-mild Calcification-moderate LEFT PELVIS: Left Common Iliac Artery - Minimal Diameter-9.5 x 8.7 mm Tortuosity-mild Calcification-moderate Left External Iliac Artery - Minimal Diameter-9.6 x 9.2 mm Tortuosity-moderate Calcification-mild Left Common Femoral Artery - Minimal Diameter-8.1 x 6.9 mm Tortuosity-mild Calcification-moderate Review of the MIP images confirms the above findings. IMPRESSION: 1. Vascular findings and measurements pertinent to potential TAVR procedure, as detailed above. 2. Severe thickening calcification of the aortic valve, compatible with the reported clinical history of severe aortic stenosis. 3. 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 An interatrial thrombus is also possible, but is considered less likely. 4. Aortic  atherosclerosis, in addition to left main and 3 vessel coronary artery disease. 5. Diffuse bronchial wall thickening with mild to moderate centrilobular and paraseptal emphysema; imaging findings suggestive of underlying COPD. 6. Dilatation of the main pancreatic duct, most prominent in the region of the head of the pancreas. The possibility of main branch intraductal papillary mucinous neoplasm (IPMN) should be considered, although these findings are favored to simply be age related. Further evaluation with nonemergent MRI of the abdomen with and without IV gadolinium with MRCP is suggested. 7. Additional incidental findings, as above. Electronically Signed   By: Vinnie Langton M.D.   On: 02/28/2018 16:29    Review of Systems - Negative except per HPI    Blood pressure 131/84, pulse 87, temperature 98.1 F (36.7 C), temperature source Oral, resp. rate 17, SpO2 93 %. Physical Exam  Assessment/Plan: Patient has extensive cardiac disease and is on dual anti-platelet agents.  He had a fall and has a brain lesion, which is consistent with a benign mass, most likely a meningioma.  Areas of high signal do not represent hemorrhage, but rather areas of calcification within the tumor.  This mass is too big to be treated with radiation.  Ideally, it would be removed via craniotomy.  This is not an option at the present.  The risk of hemorrhage within the tumor  is low.  I would continue anti-platelet therapy and have patient follow up with me in office.  I will coordinate future treatment options with his cardiologists.  Peggyann Shoals, MD 02/28/2018, 6:38 PM

## 2018-02-28 NOTE — Consult Note (Addendum)
Cardiology Consult    Patient ID: [name] MRN: [id number], DOB/AGE: 83-Mar-1933   Admit date: 02/28/2018 Date of Consult: 02/28/2018  Primary Physician: Celene Squibb, MD Primary Cardiologist: Kate Sable, MD Requesting Provider: Dr. Ellender Hose (Emergency Department).  Patient Profile    [name] is a 85 y.o. male with a history of CAD s/p recent PCI with DES to LAD on 02/19/2018, HTN, HLD, sleep apnea, GERD, Parkinson's disease, chronic diastolic CHF,  infrarenal abdominal aortic aneurysm  and severe aortic stenosis, who presented to the [facility] ED after a mechanical fall and was found to have a brain bleed. Cardiology was consulted to assist with antiplatelet given recent cardiac stent at the request of Dr. Ellender Hose.  History of Present Illness    [name] is a 85 year old male with the above history who is followed by Dr. Bronson Ing. Patient was admitted on 02/19/2018 for outpatient cardiac catheterization after presenting with shortness of breath with exertion over the past year. Patient denied any other cardiac symptoms prior to catheterization including chest pain, lightheadedness, syncope, orthopnea, PND, or edema. Cardiac eart catheterization showed severe RCA stenosis with subtotal occlusion of the distal RCA and collaterals from the left coronary artery supplying the PDA and PL branches as well as severe mid LAD stenosis. Patient underwent successful PCI with DES to the LAD lesion. Patient also noted to have severe aortic stenosis with mean transvalvular gradient of 30 mmHg and peak gradient of 42 mmHg. Patient was started on dual antiplatelet therapy with Aspirin and Plavix. He was discharged in stable condition with plans for TAVR evaluation and surgical consultation.  Patient presented to the [facility] ED today after suffering a mechanical fall. Patient uses a motor scooter to get around and turned the scooter to sharply causing the scooter to fall over. Patient  sustained hit is right wrist, shoulder, and elbow. He denies any hitting his head or any loss of consciousness. Patient states he has felt great since being discharged last week. He still has a little shortness of breath but denies any chest pain. Patient denies any other symptoms at this time. Patient's head and face appear very red and patient's son does note that this is new and he first noticed it about 2 days ago.   Head CT reveals mass-like lentiform isodense extra-axial collection over the right frontotemporal region. Mild mass effect with midline shift of 11mm to the left was also noted. Per Radiology read, this appears to be more typical of a subacute to chronic process and may represent a subacute epidural hematoma versus extra-axial mass such as a meningioma. Additional imaging has been ordered.  Cardiology was asked to way in on continuation of antiplatelets with possible brain bleed in the setting of recent cardiac stent.   Past Medical History   Past Medical History:  Diagnosis Date  . Arthritis   . Cancer [facility])    h/o  skin  cancer  . Coronary artery disease   . Ex-smoker   . GERD (gastroesophageal reflux disease)   . Hypercholesteremia   . Hypertension   . Lumbar stenosis   . OSA (obstructive sleep apnea)   . Parkinson's disease ([place])   . Severe aortic stenosis     Past Surgical History:  Procedure Laterality Date  . APPENDECTOMY    . BACK SURGERY  11/2013  . COLONOSCOPY  11/17/2008   Rourk: Sessile polyp in the cecum status post saline-assisted piecemeal polypectomy, resolution clipping and epinephrine injection  therapy performed. Shallow sigmoid diverticula.. Tubular adenoma  . COLONOSCOPY  11/19/2008   Fields: Large amount of liquid blood with clots seen throughout the colon. Previous [place] resolution clip remained in place. Purple spot seen in the lateral aspect polypectomy site, 3 resolution clips placed here. 6 mm sessile ascending colon polyp seen and not  manipulated.  . COLONOSCOPY  11/25/2008   Rourk: Blood-tinged colonic effluent, suspect recurrent post polypectomy bleeding status post placement of 3 clips on the polypectomy site on top of the 3 clips that already existed, one had fallen off since last procedure. One small polyp distal to the cecum not manipulated  . COLONOSCOPY  12/06/2009   Rourk: Internal hemorrhoids, scattered left-sided diverticula, cecal polyps, one snared and subsequently clipped, 2 adjacent diminutive polyps ablated. tubular adenoma  . COLONOSCOPY N/A 01/21/2015   Dr. Gala Romney: 1.5 cm carpet-type polyp just distal to the ileocecal valve removed piecemeal fashion and ablated. Small polyp in the rectum. Pathology-tubular adenomas  . COLONOSCOPY N/A 09/01/2015   Procedure: COLONOSCOPY;  Surgeon: Daneil Dolin, MD;  Location: [facility];  Service: Endoscopy;  Laterality: N/A;  845  . CORONARY STENT INTERVENTION N/A 02/19/2018   Procedure: CORONARY STENT INTERVENTION;  Surgeon: [name], MD;  Location: [place] [facility];  Service: Cardiovascular;  Laterality: N/A;  . EYE SURGERY     bilateral  cataracts  . INTRAVASCULAR PRESSURE WIRE/FFR STUDY N/A 02/19/2018   Procedure: INTRAVASCULAR PRESSURE WIRE/FFR STUDY;  Surgeon: [name], MD;  Location: [place] [facility];  Service: Cardiovascular;  Laterality: N/A;  . MENISCUS REPAIR     right knee  2000  . POLYPECTOMY  09/01/2015   Procedure: POLYPECTOMY;  Surgeon: Daneil Dolin, MD;  Location: [facility];  Service: Endoscopy;;  colon  . RIGHT/LEFT HEART CATH AND CORONARY ANGIOGRAPHY N/A 02/19/2018   Procedure: RIGHT/LEFT HEART CATH AND CORONARY ANGIOGRAPHY;  Surgeon: [name], MD;  Location: [place] [facility];  Service: Cardiovascular;  Laterality: N/A;  . TONSILLECTOMY Bilateral      Allergies  No Known Allergies  Inpatient Medications      Family History    Family History  Problem Relation Age of Onset  . Dementia Mother   . Cancer Brother   .  Stroke Brother    He indicated that his mother is deceased. He indicated that his father is deceased. He indicated that both of his brothers are deceased.   Social History    Social History   Socioeconomic History  . Marital status: Married    Spouse name: Clinical research associate  . Number of children: 4  . Years of education: 66  . Highest education level: Not on file  Occupational History  . Occupation: Retired   Scientific laboratory technician  . Financial resource strain: Not on file  . Food insecurity:    Worry: Not on file    Inability: Not on file  . Transportation needs:    Medical: Not on file    Non-medical: Not on file  Tobacco Use  . Smoking status: Former Smoker    Packs/day: 1.00    Types: Cigarettes, Pipe    Start date: 01/22/1951    Last attempt to quit: 01/21/1989    Years since quitting: 29.1  . Smokeless tobacco: Never Used  Substance and Sexual Activity  . Alcohol use: No    Alcohol/week: 0.0 standard drinks  . Drug use: No  . Sexual activity: Not on file  Lifestyle  . Physical activity:  Days per week: Not on file    Minutes per session: Not on file  . Stress: Not on file  Relationships  . Social connections:    Talks on phone: Not on file    Gets together: Not on file    Attends religious service: Not on file    Active member of club or organization: Not on file    Attends meetings of clubs or organizations: Not on file    Relationship status: Not on file  . Intimate partner violence:    Fear of current or ex partner: Not on file    Emotionally abused: Not on file    Physically abused: Not on file    Forced sexual activity: Not on file  Other Topics Concern  . Not on file  Social History Narrative  . Not on file     Review of Systems   Please see H&P Review of Systems  Respiratory: Positive for shortness of breath (mild).   Cardiovascular: Negative for chest pain, orthopnea and leg swelling.  Musculoskeletal: Positive for falls and joint pain.  Skin:       Head  and face red x2 days; abrasions on right arm from fall  Neurological: Positive for tremors (Parkinson's). Negative for loss of consciousness.  All other systems reviewed and are negative.   Physical Exam    Blood pressure 137/83, pulse 87, temperature 98.1 F (36.7 C), temperature source Oral, resp. rate 18, SpO2 92 %.  General: 85 y.o. male resting comfortably in no acute distress. Pleasant and cooperative. HEENT: Normal. Head and face appear very flushed. Neck: Supple. Lungs: No increased work of breathing. Clear to auscultation bilaterally. No wheezes, rhonchi, or rales. Heart: RRR. Distinct S1 and S2. III/VI harsh systolic murmur heard best at the upper sternal border. No gallops or rubs.  Abdomen: Soft, non-distended, and non-tender to palpation. Bowel sounds present.  Extremities: Trace lower extremity edema. Radial 2+ and equal bilaterally. Skin: Warm and dry. Neuro: Alert and oriented x3. No focal deficits. Moves all extremities spontaneously. Parkinson's tremor noted. Psych: Normal affect. Responds appropriately.   Labs    Troponin (Point of Care Test) No results for input(s): TROPIPOC in the last 72 hours. No results for input(s): CKTOTAL, CKMB, TROPONINI in the last 72 hours. Lab Results  Component Value Date   WBC 9.6 02/28/2018   HGB 14.4 02/28/2018   HCT 40.8 02/28/2018   MCV 99.5 02/28/2018   PLT 187 02/28/2018    Recent Labs  Lab 02/28/18 1310  NA 140  K 3.5  CL 106  CO2 26  BUN 24*  CREATININE 1.16  CALCIUM 9.0  GLUCOSE 146*   Lab Results  Component Value Date   CHOL 144 02/20/2018   HDL 37 (L) 02/20/2018   LDLCALC 93 02/20/2018   TRIG 68 02/20/2018   No results found for: [facility]   Radiology Studies    Dg Elbow Complete Left  Result Date: 02/28/2018 CLINICAL DATA:  [place]. EXAM: LEFT ELBOW - COMPLETE 3+ VIEW COMPARISON:  None. FINDINGS: The joint spaces are maintained. No acute fracture is identified. No joint effusion. IMPRESSION: No acute  bony findings or joint effusion. Minimal degenerative changes. Electronically Signed   By: Marijo Sanes M.D.   On: 02/28/2018 15:24   Dg Wrist Complete Left  Result Date: 02/28/2018 CLINICAL DATA:  [place].  Left wrist pain. EXAM: LEFT WRIST - COMPLETE 3+ VIEW COMPARISON:  None. FINDINGS: The joint spaces are maintained. No significant degenerative changes. No  acute fracture. Mild chondrocalcinosis noted. IMPRESSION: No acute wrist fracture. Electronically Signed   By: Marijo Sanes M.D.   On: 02/28/2018 15:26   Ct Head Wo Contrast  Result Date: 02/28/2018 CLINICAL DATA:  Tipped over in motorized wheelchair landing on left side. EXAM: CT HEAD WITHOUT CONTRAST CT CERVICAL SPINE WITHOUT CONTRAST TECHNIQUE: Multidetector CT imaging of the head and cervical spine was performed following the standard protocol without intravenous contrast. Multiplanar CT image reconstructions of the cervical spine were also generated. COMPARISON:  None. FINDINGS: CT HEAD FINDINGS Brain: Ventricles and cisterns are within normal. There is an isodense lentiform masslike extra-axial density over the right frontotemporal region measuring 7 x 3.3 cm in AP and transverse dimension. There is a distinct plane of CSF density between this extra-axial collection and adjacent cortex. There are a couple small rounded more hyperdense foci along the medial periphery of this extra-axial collection which may represent acute hemorrhagic foci as the larger measures 1 cm. There is mass effect on the adjacent cortex with minimal midline shift to the left of 6 mm. Subtle chronic ischemic microvascular disease. Vascular: No hyperdense vessel or unexpected calcification. Skull: No evidence of fracture. Sinuses/Orbits: Normal. Other: No significant soft tissue abnormality. CT CERVICAL SPINE FINDINGS Alignment: Normal. Skull base and vertebrae: Vertebral body heights are maintained. There is mild spondylosis throughout the cervical spine. Atlantoaxial  articulation is within normal. There is uncovertebral joint spurring and facet arthropathy. Fusion of the right C3-4 facet joint. Mild bilateral neural from narrowing from the C3-4 level to the C5-6 level. No acute fracture or subluxation. Soft tissues and spinal canal: No prevertebral fluid or swelling. No visible canal hematoma. Disc levels:  Disc space narrowing at the C3-4 level. Upper chest: Mild emphysematous disease over the lung apices. Couple subcentimeter hypodense nodules over the right lobe of the thyroid. Other: None. IMPRESSION: Masslike lentiform isodense extra-axial collection over the right frontotemporal region measuring 7.0 x 3.3 cm in AP and transverse dimension. Couple small more hyperdense nodular foci along the medial aspect of this collection suggesting hemorrhagic foci. There is mild mass effect on the adjacent cortex although a plane of CSF is maintained. Midline shift 6 mm to the left. This appears to be more typical of a subacute to chronic process and may represent a subacute epidural hematoma versus extra-axial mass such as a meningioma. Recommend clinical correlation as MRI with without contrast may be helpful for further evaluation. No acute cervical spine injury. Minimal chronic ischemic microvascular disease. Mild spondylosis of the cervical spine. Disc disease at the C3-4 level. These results were called by telephone at the time of interpretation on 02/28/2018 at 3:17 pm to Dr. Aletta Edouard, who verbally acknowledged these results. Electronically Signed   By: Marin Olp M.D.   On: 02/28/2018 15:17   Ct Cervical Spine Wo Contrast  Result Date: 02/28/2018 CLINICAL DATA:  Tipped over in motorized wheelchair landing on left side. EXAM: CT HEAD WITHOUT CONTRAST CT CERVICAL SPINE WITHOUT CONTRAST TECHNIQUE: Multidetector CT imaging of the head and cervical spine was performed following the standard protocol without intravenous contrast. Multiplanar CT image reconstructions of the  cervical spine were also generated. COMPARISON:  None. FINDINGS: CT HEAD FINDINGS Brain: Ventricles and cisterns are within normal. There is an isodense lentiform masslike extra-axial density over the right frontotemporal region measuring 7 x 3.3 cm in AP and transverse dimension. There is a distinct plane of CSF density between this extra-axial collection and adjacent cortex. There are a couple small  rounded more hyperdense foci along the medial periphery of this extra-axial collection which may represent acute hemorrhagic foci as the larger measures 1 cm. There is mass effect on the adjacent cortex with minimal midline shift to the left of 6 mm. Subtle chronic ischemic microvascular disease. Vascular: No hyperdense vessel or unexpected calcification. Skull: No evidence of fracture. Sinuses/Orbits: Normal. Other: No significant soft tissue abnormality. CT CERVICAL SPINE FINDINGS Alignment: Normal. Skull base and vertebrae: Vertebral body heights are maintained. There is mild spondylosis throughout the cervical spine. Atlantoaxial articulation is within normal. There is uncovertebral joint spurring and facet arthropathy. Fusion of the right C3-4 facet joint. Mild bilateral neural from narrowing from the C3-4 level to the C5-6 level. No acute fracture or subluxation. Soft tissues and spinal canal: No prevertebral fluid or swelling. No visible canal hematoma. Disc levels:  Disc space narrowing at the C3-4 level. Upper chest: Mild emphysematous disease over the lung apices. Couple subcentimeter hypodense nodules over the right lobe of the thyroid. Other: None. IMPRESSION: Masslike lentiform isodense extra-axial collection over the right frontotemporal region measuring 7.0 x 3.3 cm in AP and transverse dimension. Couple small more hyperdense nodular foci along the medial aspect of this collection suggesting hemorrhagic foci. There is mild mass effect on the adjacent cortex although a plane of CSF is maintained. Midline  shift 6 mm to the left. This appears to be more typical of a subacute to chronic process and may represent a subacute epidural hematoma versus extra-axial mass such as a meningioma. Recommend clinical correlation as MRI with without contrast may be helpful for further evaluation. No acute cervical spine injury. Minimal chronic ischemic microvascular disease. Mild spondylosis of the cervical spine. Disc disease at the C3-4 level. These results were called by telephone at the time of interpretation on 02/28/2018 at 3:17 pm to Dr. Aletta Edouard, who verbally acknowledged these results. Electronically Signed   By: Marin Olp M.D.   On: 02/28/2018 15:17   Ct Coronary Morph W/cta Cor W/score W/ca W/cm &/or Wo/cm  Result Date: 02/28/2018 EXAM: OVER-READ INTERPRETATION  CT CHEST The following report is an over-read performed by radiologist Dr. Vinnie Langton of [facility], [place] on 02/28/2018. This over-read does not include interpretation of cardiac or coronary anatomy or pathology. The coronary calcium score/coronary CTA interpretation by the cardiologist is attached. COMPARISON:  Chest CT 03/27/2011. FINDINGS: Extracardiac findings will be described separately under dictation for contemporaneously obtained CTA chest, abdomen and pelvis. IMPRESSION: Please see separate dictation for contemporaneously obtained CTA chest, abdomen and pelvis 02/28/2018 for full description of relevant extracardiac findings. Electronically Signed   By: Vinnie Langton M.D.   On: 02/28/2018 15:38   Dg Shoulder Left  Result Date: 02/28/2018 CLINICAL DATA:  [place].  Left shoulder pain. EXAM: LEFT SHOULDER - 2+ VIEW COMPARISON:  None. FINDINGS: The joint spaces are maintained. No acute bony findings or bone lesion. No abnormal soft tissue calcifications. The visualized lung is clear and the visualized ribs are intact. IMPRESSION: No fracture or dislocation. Electronically Signed   By: Marijo Sanes M.D.   On: 02/28/2018 15:23    Ct Angio Chest Aorta W &/or Wo Contrast  Result Date: 02/28/2018 CLINICAL DATA:  85 year old male with history of severe aortic stenosis. Preprocedural study prior to potential transcatheter aortic valve replacement (TAVR) procedure. EXAM: CT ANGIOGRAPHY CHEST, ABDOMEN AND PELVIS TECHNIQUE: Multidetector CT imaging through the chest, abdomen and pelvis was performed using the standard protocol during bolus administration of intravenous contrast. Multiplanar reconstructed images  and MIPs were obtained and reviewed to evaluate the vascular anatomy. CONTRAST:  132mL ISOVUE-370 IOPAMIDOL (ISOVUE-370) INJECTION 76% COMPARISON:  Chest CT 03/27/2011. FINDINGS: CTA CHEST FINDINGS Cardiovascular: Heart size is mildly enlarged. There is no significant pericardial fluid, thickening or pericardial calcification. There is aortic atherosclerosis, as well as atherosclerosis of the great vessels of the mediastinum and the coronary arteries, including calcified atherosclerotic plaque in the left main, left anterior descending, left circumflex and right coronary arteries. Severe thickening calcification of the aortic valve. 1.5 cm filling defect in the inferior aspect of the left atrium associated with the inferior interatrial septum, suspicious for small myxoma, or less likely a thrombus. Mediastinum/Lymph Nodes: No pathologically enlarged mediastinal or hilar lymph nodes. Moderate-sized hiatal hernia. Patulous esophagus. No axillary lymphadenopathy. Lungs/Pleura: Mild diffuse bronchial wall thickening with mild to moderate centrilobular and paraseptal emphysema. No acute consolidative airspace disease. No pleural effusions. No suspicious appearing pulmonary nodules or masses are noted. Musculoskeletal/Soft Tissues: There are no aggressive appearing lytic or blastic lesions noted in the visualized portions of the skeleton. CTA ABDOMEN AND PELVIS FINDINGS Hepatobiliary: Low-attenuation lesion in between segments 4A and 8 (axial  image 80 of series 6) measuring 2.7 x 2.4 cm, compatible with a cyst. No suspicious hepatic lesions are otherwise noted. No intra or extrahepatic biliary ductal dilatation. Gallbladder is normal in appearance. Pancreas: No definite pancreatic mass. There is diffuse pancreatic ductal dilatation, most evident in the head of the pancreas where the main pancreatic duct measures up to 8 mm shortly before the ampulla. No pancreatic or peripancreatic fluid collections or inflammatory changes. Spleen: Unremarkable. Adrenals/Urinary Tract: 2.1 cm low-attenuation lesion in the upper pole of the left kidney, compatible with a simple cyst. Right kidney and bilateral adrenal glands are normal in appearance. No hydroureteronephrosis. Urinary bladder is normal in appearance. Stomach/Bowel: Normal appearance of the stomach. No pathologic dilatation of small bowel or colon. The appendix is not confidently identified and may be surgically absent. Regardless, there are no inflammatory changes noted adjacent to the cecum to suggest the presence of an acute appendicitis at this time. Vascular/Lymphatic: Aortic atherosclerosis with vascular findings and measurements pertinent to potential TAVR procedure, as detailed below. Fusiform infrarenal abdominal aortic aneurysm measuring up to 4.5 x 4.9 cm (axial image 121 of series 6). 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 No lymphadenopathy noted in the abdomen or pelvis. Reproductive: Prostate gland and seminal vesicles are unremarkable in appearance. Other: No significant volume of ascites.  No pneumoperitoneum. Musculoskeletal: Status post PLIF at L4-L5 with interbody grafts at L4-L5 interspace. There are no aggressive appearing lytic or blastic lesions noted in the visualized portions of the skeleton. VASCULAR MEASUREMENTS PERTINENT TO TAVR: AORTA: Minimal Aortic Diameter-21 x 18 mm Severity of Aortic Calcification-moderate  RIGHT PELVIS: Right Common Iliac Artery - Minimal Diameter-11.6 x 10.0 mm Tortuosity-mild Calcification-moderate Right External Iliac Artery - Minimal Diameter-8.7 x 8.1 mm Tortuosity-moderate Calcification-mild Right Common Femoral Artery - Minimal Diameter-8.6 x 7.7 mm Tortuosity-mild Calcification-moderate LEFT PELVIS: Left Common Iliac Artery - Minimal Diameter-9.5 x 8.7 mm Tortuosity-mild Calcification-moderate Left External Iliac Artery - Minimal Diameter-9.6 x 9.2 mm Tortuosity-moderate Calcification-mild Left Common Femoral Artery - Minimal Diameter-8.1 x 6.9 mm Tortuosity-mild Calcification-moderate Review of the MIP images confirms the above findings. IMPRESSION: 1. Vascular findings and measurements pertinent to potential TAVR procedure, as detailed above. 2. Severe thickening calcification of the aortic valve, compatible with the reported clinical history of severe aortic  stenosis. 3. 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 An interatrial thrombus is also possible, but is considered less likely. 4. Aortic atherosclerosis, in addition to left main and 3 vessel coronary artery disease. 5. Diffuse bronchial wall thickening with mild to moderate centrilobular and paraseptal emphysema; imaging findings suggestive of underlying COPD. 6. Dilatation of the main pancreatic duct, most prominent in the region of the head of the pancreas. The possibility of main branch intraductal papillary mucinous neoplasm (IPMN) should be considered, although these findings are favored to simply be age related. Further evaluation with nonemergent MRI of the abdomen with and without IV gadolinium with MRCP is suggested. 7. Additional incidental findings, as above. Electronically Signed   By: Vinnie Langton M.D.   On: 02/28/2018 16:29   Dg Hip Unilat W Or Wo Pelvis 2-3 Views Left  Result Date: 02/28/2018 CLINICAL DATA:   [place].  Left hip pain. EXAM: DG HIP (WITH OR WITHOUT PELVIS) 2-3V LEFT COMPARISON:  None. FINDINGS: Both hips are normally located. Mild degenerative changes but no acute fracture or AVN. The pubic symphysis and SI joints are intact. Lower lumbar fusion hardware noted. Contrast noted in the bladder from a prior CT scan. IMPRESSION: No acute bony findings. Electronically Signed   By: Marijo Sanes M.D.   On: 02/28/2018 15:27   Ct Angio Abdomen Pelvis  W &/or Wo Contrast  Result Date: 02/28/2018 CLINICAL DATA:  85 year old male with history of severe aortic stenosis. Preprocedural study prior to potential transcatheter aortic valve replacement (TAVR) procedure. EXAM: CT ANGIOGRAPHY CHEST, ABDOMEN AND PELVIS TECHNIQUE: Multidetector CT imaging

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
## 2018-02-28 NOTE — ED Notes (Signed)
Patient verbalizes understanding of discharge instructions. Opportunity for questioning and answers were provided. Armband removed by staff, pt discharged from ED. Pt wheeled to lobby and taken home by family 
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
## 2018-02-28 NOTE — ED Notes (Signed)
ED Provider at bedside. 

## 2018-02-28 NOTE — ED Notes (Addendum)
Pt took parkinson's medicine around 1330 without water. Pt last had something to eat (crackers at noon). EDP made aware. Pt NPO now until seen by Zada Finders, MD.

## 2018-02-28 NOTE — Discharge Instructions (Addendum)
You have been evaluated for your fall.  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  Please call and follow-up with neurosurgeon, Dr. Vertell Limber, next week for further evaluation.  Please continue taking blood thinner medication as previously prescribed.

## 2018-03-13 ENCOUNTER — Encounter (HOSPITAL_COMMUNITY): Payer: Medicare Other

## 2018-03-13 ENCOUNTER — Other Ambulatory Visit: Payer: Self-pay

## 2018-03-13 ENCOUNTER — Ambulatory Visit (HOSPITAL_BASED_OUTPATIENT_CLINIC_OR_DEPARTMENT_OTHER)
Admit: 2018-03-13 | Discharge: 2018-03-13 | Disposition: A | Discharge status: Home / Self Care | Payer: Medicare Other | Attending: Cardiovascular Disease | Admitting: Cardiovascular Disease

## 2018-03-13 ENCOUNTER — Ambulatory Visit: Payer: Medicare Other | Attending: Cardiovascular Disease | Admitting: Physical Therapy

## 2018-03-13 ENCOUNTER — Encounter: Payer: Self-pay | Admitting: Surgery

## 2018-03-13 ENCOUNTER — Ambulatory Visit (HOSPITAL_COMMUNITY)
Admission: RE | Admit: 2018-03-13 | Discharge: 2018-03-13 | Disposition: A | Discharge status: Home / Self Care | Payer: Medicare Other | Source: Ambulatory Visit | Attending: Cardiovascular Disease | Admitting: Cardiovascular Disease

## 2018-03-13 ENCOUNTER — Institutional Professional Consult (permissible substitution) (INDEPENDENT_AMBULATORY_CARE_PROVIDER_SITE_OTHER): Payer: Medicare Other | Admitting: Surgery

## 2018-03-13 ENCOUNTER — Encounter: Payer: Self-pay | Admitting: Physical Therapy

## 2018-03-13 VITALS — BP 108/76 | HR 94 | Resp 18 | Ht 70.0 in | Wt 206.6 lb

## 2018-03-13 DIAGNOSIS — Z01818 Encounter for other preprocedural examination: Secondary | ICD-10-CM | POA: Diagnosis not present

## 2018-03-13 DIAGNOSIS — I350 Nonrheumatic aortic (valve) stenosis: Secondary | ICD-10-CM

## 2018-03-13 DIAGNOSIS — R262 Difficulty in walking, not elsewhere classified: Secondary | ICD-10-CM

## 2018-03-13 DIAGNOSIS — J988 Other specified respiratory disorders: Secondary | ICD-10-CM | POA: Diagnosis not present

## 2018-03-13 LAB — PULMONARY FUNCTION TEST
DL/VA % pred: 75 %
DL/VA: 2.9 ml/min/mmHg/L
DLCO cor % pred: 50 %
DLCO cor: 12.16 ml/min/mmHg
DLCO unc % pred: 50 %
DLCO unc: 12.09 ml/min/mmHg
FEF 25-75 Post: 0.51 L/sec
FEF 25-75 Pre: 0.43 L/sec
FEF2575-%Change-Post: 18 %
FEF2575-%Pred-Post: 28 %
FEF2575-%Pred-Pre: 24 %
FEV1-%Change-Post: -5 %
FEV1-%Pred-Post: 58 %
FEV1-%Pred-Pre: 61 %
FEV1-PRE: 1.66 L
FEV1-Post: 1.57 L
FEV1FVC-%Change-Post: -5 %
FEV1FVC-%Pred-Pre: 62 %
FEV6-%Change-Post: 5 %
FEV6-%Pred-Post: 89 %
FEV6-%Pred-Pre: 84 %
FEV6-POST: 3.19 L
FEV6-PRE: 3.02 L
FEV6FVC-%Change-Post: 5 %
FEV6FVC-%PRED-POST: 91 %
FEV6FVC-%Pred-Pre: 86 %
FVC-%CHANGE-POST: 0 %
FVC-%Pred-Post: 97 %
FVC-%Pred-Pre: 97 %
FVC-Post: 3.75 L
FVC-Pre: 3.75 L
Post FEV1/FVC ratio: 42 %
Post FEV6/FVC ratio: 85 %
Pre FEV1/FVC ratio: 44 %
Pre FEV6/FVC Ratio: 81 %
RV % pred: 103 %
RV: 2.84 L
TLC % pred: 86 %
TLC: 6.09 L

## 2018-03-13 MED ORDER — ALBUTEROL SULFATE (2.5 MG/3ML) 0.083% IN NEBU
2.5000 mg | INHALATION_SOLUTION | Freq: Once | RESPIRATORY_TRACT | Status: AC
  Administered 2018-03-13: 2.5 mg via RESPIRATORY_TRACT

## 2018-03-13 NOTE — Therapy (Signed)
[place], [place], [address] Phone: [phone]   Fax:  [phone]  Physical Therapy Evaluation/Pre-TAVR  Patient Details  Name: [name] MRN: [id number] Date of Birth: 1931/04/07 Referring Provider (PT): [name], MD   Encounter Date: 03/13/2018  PT End of Session - 03/13/18 1357    Visit Number  1    PT Start Time  1320    PT Stop Time  1357    PT Time Calculation (min)  37 min    Activity Tolerance  Patient tolerated treatment well    Behavior During Therapy  [facility] for tasks assessed/performed       Past Medical History:  Diagnosis Date  . Arthritis   . Cancer [facility])    h/o  skin  cancer  . Coronary artery disease   . Ex-smoker   . GERD (gastroesophageal reflux disease)   . Hypercholesteremia   . Hypertension   . Lumbar stenosis   . OSA (obstructive sleep apnea)   . Parkinson's disease ([place])   . Severe aortic stenosis     Past Surgical History:  Procedure Laterality Date  . APPENDECTOMY    . BACK SURGERY  11/2013  . COLONOSCOPY  11/17/2008   Rourk: Sessile polyp in the cecum status post saline-assisted piecemeal polypectomy, resolution clipping and epinephrine injection therapy performed. Shallow sigmoid diverticula.. Tubular adenoma  . COLONOSCOPY  11/19/2008   Fields: Large amount of liquid blood with clots seen throughout the colon. Previous [place] resolution clip remained in place. Purple spot seen in the lateral aspect polypectomy site, 3 resolution clips placed here. 6 mm sessile ascending colon polyp seen and not manipulated.  . COLONOSCOPY  11/25/2008   Rourk: Blood-tinged colonic effluent, suspect recurrent post polypectomy bleeding status post placement of 3 clips on the polypectomy site on top of the 3 clips that already existed, one had fallen off since last procedure. One small polyp distal to the cecum not manipulated  . COLONOSCOPY  12/06/2009   [place]k: Internal  hemorrhoids, scattered left-sided diverticula, cecal polyps, one snared and subsequently clipped, 2 adjacent diminutive polyps ablated. tubular adenoma  . COLONOSCOPY N/A 01/21/2015   Dr. Gala Romney: 1.5 cm carpet-type polyp just distal to the ileocecal valve removed piecemeal fashion and ablated. Small polyp in the rectum. Pathology-tubular adenomas  . COLONOSCOPY N/A 09/01/2015   Procedure: COLONOSCOPY;  Surgeon: Daneil Dolin, MD;  [facility];  Service: Endoscopy;  Laterality: N/A;  845  . CORONARY STENT INTERVENTION N/A 02/19/2018   Procedure: CORONARY STENT INTERVENTION;  Surgeon: [name], MD;  Location: [place] [facility];  Service: Cardiovascular;  Laterality: N/A;  . EYE SURGERY     bilateral  cataracts  . INTRAVASCULAR PRESSURE WIRE/FFR STUDY N/A 02/19/2018   Procedure: INTRAVASCULAR PRESSURE WIRE/FFR STUDY;  Surgeon: [name], MD;  Location: [place] [facility];  Service: Cardiovascular;  Laterality: N/A;  . MENISCUS REPAIR     right knee  2000  . POLYPECTOMY  09/01/2015   Procedure: POLYPECTOMY;  Surgeon: Daneil Dolin, MD;  [facility];  Service: Endoscopy;;  colon  . RIGHT/LEFT HEART CATH AND CORONARY ANGIOGRAPHY N/A 02/19/2018   Procedure: RIGHT/LEFT HEART CATH AND CORONARY ANGIOGRAPHY;  Surgeon: [name], MD;  Location: [place] [facility];  Service: Cardiovascular;  Laterality: N/A;  . TONSILLECTOMY Bilateral     There were no vitals filed for this visit.   Subjective Assessment - 03/13/18 1329    Subjective  Found blockage with annual visit to cardiologist. Has shortness of breath with climbing stairs to basement and when hurrying/pushing myself. Began a couple of months ago.     Currently in Pain?  No/denies         [facility] PT Assessment - 03/13/18 0001      Assessment   Medical Diagnosis  severe aortic stenosis    Referring Provider (PT)  [name], MD    Hand Dominance  Right      Precautions   Precautions  None       Restrictions   Weight Bearing Restrictions  No      Balance Screen   Has the patient fallen in the past 6 months  Yes    How many times?  some    Has the patient had a decrease in activity level because of a fear of falling?   No    Is the patient reluctant to leave their home because of a fear of falling?   No      Home Film/video editor residence    Living Arrangements  Spouse/significant other    Additional Comments  staris at home to basement      Prior Function   Level of [place]  Retired      Associate Professor   Overall Cognitive Status  Within Functional Limits for tasks assessed      Sensation   Additional Comments  [facility]      Posture/Postural Control   Posture Comments  kyphotic with rounded shoulders & forward head      ROM / Strength   AROM / PROM / Strength  Strength;AROM      AROM   Overall AROM Comments  Mild limitation in gross ROM- limited by tremor      Strength   Overall Strength Comments  gross strength 5/5    Strength Assessment Site  Hand    Right/Left hand  Right;Left    Right Hand Grip (lbs)  65    Left Hand Grip (lbs)  60      Ambulation/Gait   Gait Comments  slow cadence, short step length, occasional shuffling       [facility] Pre-Surgical Assessment - 03/13/18 0001    5 Meter Walk Test- trial 1  3 sec    5 Meter Walk Test- trial 2  3 sec.     5 Meter Walk Test- trial 3  4 sec.    5 meter walk test average  3.33 sec    4 Stage Balance Test Position  1    Comment  unable without use of UE    6 Minute Walk- Baseline  yes    BP (mmHg)  113/73    HR (bpm)  86    02 Sat (%RA)  94 %    Modified Borg Scale for Dyspnea  0- Nothing at all    Perceived Rate of Exertion (Borg)  6-    6 Minute Walk Post Test  yes    BP (mmHg)  146/71    HR (bpm)  106    02 Sat (%RA)  92 %    Modified Borg Scale for Dyspnea  6-    Perceived Rate of Exertion (Borg)  11- Fairly light    Aerobic Endurance Distance Walked   995    Endurance additional comments  27% disability compared to age related norm  Objective measurements completed on examination: See above findings.                           Plan - 03/13/18 1357    PT Frequency  --   one time pre TAVR evaluation      Clinical Impression Statement: Pt is a 85 yo M presenting to OP PT for evaluation prior to possible TAVR surgery due to severe aortic stenosis. Pt reports onset of SOB a few months ago. Symptoms are  limiting stair climbing and "pushing myself". Pt presents with mildly limited ROM, strength WFL and denies musculoskeletal pain. Pt ambulated a total of 995 feet in 6 minute walk and reported 6/10 SOB on modified scale for dyspena and 11/20 RPE on Borg's perceived exertion and pain scale at the end of the walk. During the 6 minute walk test, patient's HR increased to 110 BPM and O2 saturation decreased to 86%. Based on the Short Physical Performance Battery, patient has a frailty rating of 5/12 with </= 5/12 considered frail.  Visit Diagnosis: Difficulty in walking, not elsewhere classified     Problem List Patient Active Problem List   Diagnosis Date Noted  . Severe aortic stenosis 02/19/2018  . OSA (obstructive sleep apnea)   . History of colonic polyps   . Hx of adenomatous colonic polyps 01/04/2015  . Lumbar canal stenosis 12/04/2013  . Hypoxemia 04/16/2012  . Primary central sleep apnea 04/16/2012  . Orthostatic hypotension 04/16/2012  . Unspecified hereditary and idiopathic peripheral neuropathy 04/16/2012  . Parkinson's 04/16/2012  . COLONIC POLYPS, ADENOMATOUS 11/19/2008  . GERD 11/19/2008  . GI BLEEDING 11/19/2008  . HLD (hyperlipidemia) 07/03/2008  . Essential hypertension 07/03/2008   Tawyna Pellot C. Taiesha Bovard PT, DPT 03/13/18 2:02 PM   [facility] [address] [place], [place], [address] Phone: [phone]   Fax:   [phone]  Name: [name] MRN: [id number] Date of Birth: [birth year]

## 2018-03-15 NOTE — Pre-Procedure Instructions (Signed)
[name]  03/15/2018      [facility] - [place], [place] - [address] [id number] [address] [place] [address] Phone: [phone] Fax: [phone]  [place], [place] [facility] [address] [place] [address] [place] [address] Phone: [phone] Fax: [phone]  [facility] [place], [place] - [address] [place] [address] Phone: [phone] Fax: [phone]    Your procedure is scheduled on March 19, 2018.  Report to [facility] at 1000 AM.  Call this number if you have problems the morning of surgery:  [phone]   Remember:  Do not eat or drink after midnight.    Take these medicines the morning of surgery with A SIP OF WATER  Pramipexole (Mirapex) Carbidopa-Levodopa (Sinemet IR)   Follow your surgeon's instructions on when to hold/resume Plavix and aspirin.  If no instructions were given call the office to determine how they would like to you take Plavix and aspirin  Beginning now, STOP taking any  Aleve, Naproxen, Ibuprofen, Motrin, Advil, Goody's, BC's, all herbal medications, fish oil, and all vitamins    Do not wear jewelry  Do not wear lotions, powders, or colognes, or deodorant.  Men may shave face and neck.  Do not bring valuables to the hospital.  [facility] is not responsible for any belongings or valuables.  Contacts, dentures or bridgework may not be worn into surgery.  Leave your suitcase in the car.  After surgery it may be brought to your room.  For patients admitted to the hospital, discharge time will be determined by your treatment team.  Patients discharged the day of surgery will not be allowed to drive home.    [facility]- Preparing For Surgery  Before surgery, you can play an important role. Because skin is not sterile, your skin needs to be as free of germs as possible. You can reduce the number of  germs on your skin by washing with CHG (chlorahexidine gluconate) Soap before surgery.  CHG is an antiseptic cleaner which kills germs and bonds with the skin to continue killing germs even after washing.    Oral Hygiene is also important to reduce your risk of infection.  Remember - BRUSH YOUR TEETH THE MORNING OF SURGERY WITH YOUR REGULAR TOOTHPASTE  Please do not use if you have an allergy to CHG or antibacterial soaps. If your skin becomes reddened/irritated stop using the CHG.  Do not shave (including legs and underarms) for at least 48 hours prior to first CHG shower. It is OK to shave your face.  Please follow these instructions carefully.   1. Shower the NIGHT BEFORE SURGERY and the MORNING OF SURGERY with CHG.   2. If you chose to wash your hair, wash your hair first as usual with your normal shampoo.  3. After you shampoo, rinse your hair and body thoroughly to remove the shampoo.  4. Use CHG as you would any other liquid soap. You can apply CHG directly to the skin and wash gently with a scrungie or a clean washcloth.   5. Apply the CHG Soap to your body ONLY FROM THE NECK DOWN.  Do not use on open wounds or open sores. Avoid contact with your eyes, ears, mouth and genitals (private parts). Wash Face and genitals (private parts)  with your normal soap.  6. Wash thoroughly, paying special attention to  the area where your surgery will be performed.  7. Thoroughly rinse your body with warm water from the neck down.  8. DO NOT shower/wash with your normal soap after using and rinsing off the CHG Soap.  9. Pat yourself dry with a CLEAN TOWEL.  10. Wear CLEAN PAJAMAS to bed the night before surgery, wear comfortable clothes the morning of surgery  11. Place CLEAN SHEETS on your bed the night of your first shower and DO NOT SLEEP WITH PETS.  Day of Surgery:  Do not apply any deodorants/lotions.  Please wear clean clothes to the hospital/surgery center.   Remember to brush your  teeth WITH YOUR REGULAR TOOTHPASTE.  Please read over the following fact sheets that you were given.

## 2018-03-15 NOTE — Progress Notes (Addendum)
PCP - Allyn Kenner, MD Cardiologist - Kate Sable, MD  Chest x-ray - 03/18/2018 at PAT appt EKG - 02/20/2018 in EPIC  Stress Test - pt denies ECHO - 02/06/2018 in EPIC  Cardiac Cath - 02/19/2018 in EPIC  Sleep Study - Yes, positive CPAP - yes  Fasting Blood Sugar - n/a Checks Blood Sugar _____ times a day-n/a  Blood Thinner Instructions: PLAVIX- will hold tomorrow Aspirin Instructions: ASPIRIN-will hold tomorrow  Anesthesia review: Yes  Patient denies shortness of breath, fever, cough and chest pain at PAT appointment  Patient verbalized understanding of instructions that were given to them at the PAT appointment. Patient was also instructed that they will need to review over the PAT instructions again at home before surgery.

## 2018-03-17 ENCOUNTER — Encounter: Payer: Self-pay | Admitting: Surgery

## 2018-03-17 NOTE — Progress Notes (Signed)
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 Given 02/19/18 0950   Total dose as of 03/17/18 1314 500 mL Given 0950   1,500 mL 500 mL Given 0951   Radial Cocktail/Verapamil only (mL) 10 mL Given 02/19/18 0952   Total dose as of 03/17/18 1314        10 mL        heparin injection (Units) 4,500 Units Given 02/19/18 0958   Total dose as of 03/17/18 1314 4,000 Units Given 1007   10,500 Units 2,000 Units Given 1021   nitroGLYCERIN 1 mg/10 mL (100 mcg/mL) - IR/CATH LAB (mcg) 100 mcg Given 02/19/18 1014   Total dose as of 03/17/18 1314 100 mcg Given 1034   200 mcg  cangrelor [facility]) bolus via infusion (mcg/kg) 2,790 mcg Given 02/19/18 1029   Dosing weight:  93 kg        Total dose as of 03/17/18 1314        2,790  mcg        cangrelor (KENGREAL) 50,000 mcg in sodium chloride 0.9 % 250 mL (200 mcg/mL) infusion (mcg/kg/min) 4 mcg/kg/min - 111.6 mL/hr New Bag/Given 02/19/18 1030   Dosing weight:  93 kg        Total dose as of 03/17/18 1314        Cannot be calculated        iohexol (OMNIPAQUE) 350 MG/ML injection (mL) 110 mL Given 02/19/18 1044   Total dose as of 03/17/18 1314        110 mL        Sedation Time   Sedation Time Physician-1: 1 hour 4 minutes 31 seconds  Coronary Findings   Diagnostic  Dominance: Right  Left Anterior Descending  Prox LAD to Mid LAD lesion 80% stenosed  Prox LAD to Mid LAD lesion is 80% stenosed. The lesion is calcified. Diffuse 80% lesion in the mid LAD is present. The lesion is interrogated with flow wire analysis and the DFR is positive at 0.87 (ischemic threshold less than or equal to 0.89).  First Diagonal Branch  1st Diag lesion 50% stenosed  1st Diag lesion is 50% stenosed.  Left Circumflex  First Obtuse Marginal Branch  Ost 1st Mrg to 1st Mrg lesion 30% stenosed  Ost 1st Mrg to 1st Mrg lesion is 30% stenosed.  Right Coronary Artery  Mid RCA to Dist RCA lesion 99% stenosed  Mid RCA to Dist RCA lesion is 99% stenosed. TIMI-2 flow beyond lesion site with left-to-right collaterals  Right Posterior Descending Artery  Collaterals  RPDA filled by collaterals from 1st Sept.    First Right Posterolateral  Collaterals  1st RPLB filled by collaterals from 2nd Sept.    Intervention   Prox LAD to Mid LAD lesion  Stent  CATHETER LAUNCHER 6FREBU 3.5 guide catheter was inserted. Lesion crossed with guidewire using a GUIDEWIRE COMET PRESSURE. Pre-stent angioplasty was performed using a BALLOON SAPPHIRE 2.5X20. A drug-eluting stent was successfully placed using a STENT SYNERGY DES 3X38. Post-stent angioplasty was performed using a BALLOON SAPPHIRE [place] 3.5X18. Maximum pressure: 18 atm. PCI is uncomplicated. Heparin and cangrelor are used for anticoagulation. The lesion is  predilated with a 2.5 mm balloon, stented with a 3.0 x 38 mm Synergy DES, and postdilated to high pressure with a 3.5 mm noncompliant balloon. After stenting, the DFR is 0.93. Angiographically there is 0% residual stenosis and TIMI-3 flow with a step down off the distal edge of the stent.  Post-Intervention Lesion Assessment  The intervention was successful. Pre-interventional TIMI flow is 3. Post-intervention TIMI flow is 3. No complications occurred at this lesion.  There is a 0% residual stenosis post intervention.  Left Heart   Aortic Valve There is severe aortic valve stenosis. The aortic valve is calcified. There is restricted aortic valve motion.  Coronary Diagrams   Diagnostic  Dominance: Right    Intervention     Implants    Permanent Stent  Stent Synergy Des 3x38 - [id number] - Implanted    Inventory item: Melissa Noon DES 3X38 Model/Cat number: [id number]  Manufacturer: Wilkie Aye Lot number: [id number]  Device identifier: [id number] Device identifier type: GS1  GUDID Information   Request status Successful    Brand name: SYNERGY Version/Model: [id number]  Company name: [employer] MRI safety info as of 02/19/18: MR Conditional  Contains dry or latex rubber: No    GMDN P.T. name: Drug-eluting coronary artery stent, bioabsorbable-polymer-coated    As of 02/19/2018   Status: Implanted      Syngo Images   Show images for CARDIAC CATHETERIZATION  MERGE Images   Show images for CARDIAC CATHETERIZATION   Link to Procedure Log   Procedure Log    Hemo Data    Most Recent Value  Fick Cardiac Output 6.37 L/min  Fick Cardiac Output Index 3.02 (L/min)/BSA  Aortic Mean Gradient 30.3 mmHg  Aortic Peak Gradient 42 mmHg  Aortic Valve Area 1.11  Aortic Value Area Index 0.53 cm2/BSA  RA A Wave 6 mmHg  RA V Wave 5 mmHg  RA Mean 4 mmHg  RV Systolic Pressure 36 mmHg  RV Diastolic Pressure 1 mmHg  RV EDP 7 mmHg  PA Systolic Pressure 40  mmHg  PA Diastolic Pressure 16 mmHg  PA Mean 26 mmHg  PW A Wave 14 mmHg  PW V Wave 14 mmHg  PW Mean 11 mmHg  AO Systolic Pressure 937 mmHg  AO Diastolic Pressure 66 mmHg  AO Mean 88 mmHg  LV Systolic Pressure 169 mmHg  LV Diastolic Pressure 4 mmHg  LV EDP 10 mmHg  AOp Systolic Pressure 678 mmHg  AOp Diastolic Pressure 62 mmHg  AOp Mean Pressure 85 mmHg  LVp Systolic Pressure 938 mmHg  LVp Diastolic Pressure 4 mmHg  LVp EDP Pressure 10 mmHg  QP/QS 1  TPVR Index 8.61 HRUI  TSVR Index 29.14 HRUI  PVR SVR Ratio 0.18  TPVR/TSVR Ratio 0.3   ADDENDUM REPORT: 03/01/2018 10:03  CLINICAL DATA:  Aortic stenosis  EXAM: Cardiac TAVR CT  TECHNIQUE: The patient was scanned on a Siemens Force 101 slice scanner. A 120 kV retrospective scan was triggered in the descending thoracic aorta at 111 HU's. Gantry rotation speed was 270 msecs and collimation was .9 mm. No beta blockade or nitro were given. The 3D data set was reconstructed in 5% intervals of the R-R cycle. Systolic and diastolic phases were analyzed on a dedicated work station using MPR, MIP and VRT modes. The patient received 80 cc of contrast.  FINDINGS: Aortic Valve: Calcified tri leaflet with restricted leaflet motion  Aorta: Bovine arch no aneurysm moderate calcific atherosclerosis  Sinotubular Junction: 30 mm  Ascending Thoracic Aorta: 33 mm  Aortic Arch: 31 mm  Descending Thoracic Aorta: 24 mm  Sinus of Valsalva Measurements:  Non-coronary: 35.2 mm  Right - coronary: 36.1 mm  Left - coronary: 36.9 mm  Coronary Artery Height above Annulus:  Left Main: 16.2 mm above annulus  Right Coronary: 17.2 mm above annulus  Virtual Basal Annulus Measurements:  Maximum/Minimum Diameter: 31.8 mm x 28.2 mm  Perimeter: 93.6 mm  Area: 680 mm2  Coronary Arteries: Sufficient height above annulus for deployment  Optimum Fluoroscopic Angle for Delivery: LAO 7 Cranial 4  degrees  IMPRESSION: 1. Tri leaflet AV with annular area of 680 mm2 upper limits for a 29 mm Sapien 3 valve  2. Optimum angiographic angle for deployment LAO 7 Cranial 4 degrees  3.  Patent stent in LAD and occluded distal RCA  4.  Normal aortic root 3.3 cm with bovine arch  5.  Coronary arteries sufficient height above annulus for deployment  Jenkins Rouge   Electronically Signed   By: Jenkins Rouge M.D.   On: 03/01/2018 10:03   Addended by Jenkins Rouge  C, MD on 03/01/2018 10:06 AM    Study Result   EXAM: OVER-READ INTERPRETATION  CT CHEST  The following report is an over-read performed by radiologist Dr. Vinnie Langton of [facility], [place] on 02/28/2018. This over-read does not include interpretation of cardiac or coronary anatomy or pathology. The coronary calcium score/coronary CTA interpretation by the cardiologist is attached.  COMPARISON:  Chest CT 03/27/2011.  FINDINGS: Extracardiac findings will be described separately under dictation for contemporaneously obtained CTA chest, abdomen and pelvis.  IMPRESSION: Please see separate dictation for contemporaneously obtained CTA chest, abdomen and pelvis 02/28/2018 for full description of relevant extracardiac findings.  Electronically Signed: By: Vinnie Langton M.D. On: 02/28/2018 15:38      CLINICAL DATA:  85 year old male with history of severe aortic stenosis. Preprocedural study prior to potential transcatheter aortic valve replacement (TAVR) procedure.  EXAM: CT ANGIOGRAPHY CHEST, ABDOMEN AND PELVIS  TECHNIQUE: Multidetector CT imaging through the chest, abdomen and pelvis was performed using the standard protocol during bolus administration of intravenous contrast. Multiplanar reconstructed images and MIPs were obtained and reviewed to evaluate the vascular anatomy.  CONTRAST:  162m ISOVUE-370 IOPAMIDOL (ISOVUE-370) INJECTION 76%  COMPARISON:  Chest CT  03/27/2011.  FINDINGS: CTA CHEST FINDINGS  Cardiovascular: Heart size is mildly enlarged. There is no significant pericardial fluid, thickening or pericardial calcification. There is aortic atherosclerosis, as well as atherosclerosis of the great vessels of the mediastinum and the coronary arteries, including calcified atherosclerotic plaque in the left main, left anterior descending, left circumflex and right coronary arteries. Severe thickening calcification of the aortic valve. 1.5 cm filling defect in the inferior aspect of the left atrium associated with the inferior interatrial septum, suspicious for small myxoma, or less likely a thrombus.  Mediastinum/Lymph Nodes: No pathologically enlarged mediastinal or hilar lymph nodes. Moderate-sized hiatal hernia. Patulous esophagus. No axillary lymphadenopathy.  Lungs/Pleura: Mild diffuse bronchial wall thickening with mild to moderate centrilobular and paraseptal emphysema. No acute consolidative airspace disease. No pleural effusions. No suspicious appearing pulmonary nodules or masses are noted.  Musculoskeletal/Soft Tissues: There are no aggressive appearing lytic or blastic lesions noted in the visualized portions of the skeleton.  CTA ABDOMEN AND PELVIS FINDINGS  Hepatobiliary: Low-attenuation lesion in between segments 4A and 8 (axial image 80 of series 6) measuring 2.7 x 2.4 cm, compatible with a cyst. No suspicious hepatic lesions are otherwise noted. No intra or extrahepatic biliary ductal dilatation. Gallbladder is normal in appearance.  Pancreas: No definite pancreatic mass. There is diffuse pancreatic ductal dilatation, most evident in the head of the pancreas where the main pancreatic duct measures up to 8 mm shortly before the ampulla. No pancreatic or peripancreatic fluid collections or inflammatory changes.  Spleen: Unremarkable.  Adrenals/Urinary Tract: 2.1 cm low-attenuation lesion in the  upper pole of the left kidney, compatible with a simple cyst. Right kidney and bilateral adrenal glands are normal in appearance. No hydroureteronephrosis. Urinary bladder is normal in appearance.  Stomach/Bowel: Normal appearance of the stomach. No pathologic dilatation of small bowel or colon. The appendix is not confidently identified and may be surgically absent. Regardless, there are no inflammatory changes noted adjacent to the cecum to suggest the presence of an acute appendicitis at this time.  Vascular/Lymphatic: Aortic atherosclerosis with vascular findings and measurements pertinent to potential TAVR procedure, as detailed below. Fusiform infrarenal abdominal aortic aneurysm measuring up to 4.5 x 4.9 cm (axial image 121 of series 6). 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 No lymphadenopathy noted in the abdomen or pelvis.  Reproductive: Prostate gland and seminal vesicles are unremarkable in appearance.  Other: No significant volume of ascites.  No pneumoperitoneum.  Musculoskeletal: Status post PLIF at L4-L5 with interbody grafts at L4-L5 interspace. There are no aggressive appearing lytic or blastic lesions noted in the visualized portions of the skeleton.  VASCULAR MEASUREMENTS PERTINENT TO TAVR:  AORTA:  Minimal Aortic Diameter-21 x 18 mm  Severity of Aortic Calcification-moderate  RIGHT PELVIS:  Right Common Iliac Artery -  Minimal Diameter-11.6 x 10.0 mm  Tortuosity-mild  Calcification-moderate  Right External Iliac Artery -  Minimal Diameter-8.7 x 8.1 mm  Tortuosity-moderate  Calcification-mild  Right Common Femoral Artery -  Minimal Diameter-8.6 x 7.7 mm  Tortuosity-mild  Calcification-moderate  LEFT PELVIS:  Left Common Iliac Artery -  Minimal Diameter-9.5 x 8.7 mm  Tortuosity-mild  Calcification-moderate  Left External Iliac Artery  -  Minimal Diameter-9.6 x 9.2 mm  Tortuosity-moderate  Calcification-mild  Left Common Femoral Artery -  Minimal Diameter-8.1 x 6.9 mm  Tortuosity-mild  Calcification-moderate  Review of the MIP images confirms the above findings.  IMPRESSION: 1. Vascular findings and measurements pertinent to potential TAVR procedure, as detailed above. 2. Severe thickening calcification of the aortic valve, compatible with the reported clinical history of severe aortic stenosis. 3. 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 An interatrial thrombus is also possible, but is considered less likely. 4. Aortic atherosclerosis, in addition to left main and 3 vessel coronary artery disease. 5. Diffuse bronchial wall thickening with mild to moderate centrilobular and paraseptal emphysema; imaging findings suggestive of underlying COPD. 6. Dilatation of the main pancreatic duct, most prominent in the region of the head of the pancreas. The possibility of main branch intraductal papillary mucinous neoplasm (IPMN) should be considered, although these findings are favored to simply be age related. Further evaluation with nonemergent MRI of the abdomen with and without IV gadolinium with MRCP is suggested. 7. Additional incidental findings, as above.   Electronically Signed   By: Vinnie Langton M.D.   On: 02/28/2018 16:29   STS Risk Score: AVR + CABG  Risk of Mortality: 3.496% Renal failure: 4.205% Permanent stroke: 1.527% Prolonged ventilation: 16.404% DSW infection: 0.271% Reoperation: 6.401% Morbidity or Mortality: 23.921%   Impression:  This 85 year old gentleman has stage D, severe, symptomatic aortic stenosis with New York Heart Association class II symptoms of exertional fatigue and shortness of breath consistent with chronic diastolic congestive heart failure.   I have personally reviewed his 2D echocardiogram, cardiac catheterization, and CTA studies.  His echocardiogram shows a trileaflet aortic valve with severe thickening and calcification of the valve leaflets with restricted mobility and a mean gradient of 31 mmHg.  The dimensionless index is 0.21 and the valve area is 0.98 cm consistent with severe aortic stenosis.  Left ventricular systolic function is normal.  Cardiac catheterization showed an 80% mid LAD stenosis with abnormal pressure wire analysis which was successfully treated with a drug-eluting stent.  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  He has had no anginal symptoms and this will be managed with dual antiplatelet therapy for the stent and medical therapy for the residual disease in the right coronary system.  I agree that aortic valve replacement is indicated in this patient.  His STS  risk score for open surgical aortic valve replacement and coronary bypass surgery puts him in the low to moderate risk category but I think his risk would be significantly higher due to his advanced age and Parkinson's disease.  In addition he has a 7 cm intracranial tumor.  I think transcatheter aortic valve replacement would be the best treatment for him.  His gated cardiac CTA shows anatomy suitable for transcatheter aortic valve replacement using a Sapien 3 valve.  His abdominal and pelvic CTA shows adequate pelvic vascular anatomy to allow transfemoral insertion.  The patient was counseled at length regarding treatment alternatives for management of severe symptomatic aortic stenosis. The risks and benefits of surgical intervention have been discussed in detail. Long-term prognosis with medical therapy was discussed. Alternative approaches such as conventional surgical aortic valve replacement, transcatheter aortic valve replacement, and palliative medical therapy were compared and  contrasted at length. This discussion was placed in the context of the patient's own specific clinical presentation and past medical history. All of his questions have been addressed.   Following the decision to proceed with transcatheter aortic valve replacement, a discussion was held regarding what types of management strategies would be attempted intraoperatively in the event of life-threatening complications, including whether or not the patient would be considered a candidate for the use of cardiopulmonary bypass and/or conversion to open sternotomy for attempted surgical intervention. The patient is aware of the fact that transient use of cardiopulmonary bypass may be necessary.  I do not think he is a candidate for sternotomy to manage any intraoperative complications due to his age, Parkinson's disease, and intracranial tumor.  The patient has been advised of a variety of complications that might develop including but not limited to risks of death, stroke, paravalvular leak, aortic dissection or other major vascular complications, aortic annulus rupture, device embolization, cardiac rupture or perforation, mitral regurgitation, acute myocardial infarction, arrhythmia, heart block or bradycardia requiring permanent pacemaker placement, congestive heart failure, respiratory failure, renal failure, pneumonia, infection, other late complications related to structural valve deterioration or migration, or other complications that might ultimately cause a temporary or permanent loss of functional independence or other long term morbidity. The patient provides full informed consent for the procedure as described and all questions were answered.    Plan:  He will be scheduled for transfemoral transcatheter aortic valve replacement on Tuesday, 03/19/2018.   I spent 60 minutes performing this consultation and > 50% of this time was spent face to face counseling and coordinating the care of this patient's  severe symptomatic aortic stenosis.  Gaye Pollack, MD 03/13/2018

## 2018-03-18 ENCOUNTER — Encounter (HOSPITAL_COMMUNITY)
Admission: RE | Admit: 2018-03-18 | Discharge: 2018-03-18 | Disposition: A | Discharge status: Home / Self Care | Payer: Medicare Other | Source: Ambulatory Visit | Attending: Cardiovascular Disease | Admitting: Cardiovascular Disease

## 2018-03-18 ENCOUNTER — Ambulatory Visit (HOSPITAL_COMMUNITY)
Admission: RE | Admit: 2018-03-18 | Discharge: 2018-03-18 | Disposition: A | Discharge status: Home / Self Care | Payer: Medicare Other | Source: Ambulatory Visit | Attending: Cardiovascular Disease | Admitting: Cardiovascular Disease

## 2018-03-18 ENCOUNTER — Encounter (HOSPITAL_COMMUNITY): Payer: Self-pay

## 2018-03-18 ENCOUNTER — Other Ambulatory Visit: Payer: Self-pay

## 2018-03-18 DIAGNOSIS — I350 Nonrheumatic aortic (valve) stenosis: Secondary | ICD-10-CM

## 2018-03-18 DIAGNOSIS — R918 Other nonspecific abnormal finding of lung field: Secondary | ICD-10-CM | POA: Diagnosis not present

## 2018-03-18 HISTORY — DX: Other specified disorders of brain: G93.89

## 2018-03-18 LAB — CBC
HCT: 43.2 % (ref 39.0–52.0)
Hemoglobin: 15 g/dL (ref 13.0–17.0)
MCH: 34.6 pg — ABNORMAL HIGH (ref 26.0–34.0)
MCHC: 34.7 g/dL (ref 30.0–36.0)
MCV: 99.8 fL (ref 80.0–100.0)
Platelets: 176 10*3/uL (ref 150–400)
RBC: 4.33 MIL/uL (ref 4.22–5.81)
RDW: 13.8 % (ref 11.5–15.5)
WBC: 8.2 10*3/uL (ref 4.0–10.5)
nRBC: 0 % (ref 0.0–0.2)

## 2018-03-18 LAB — COMPREHENSIVE METABOLIC PANEL
ALT: <5 U/L (ref 0–44)
AST: 29 U/L (ref 15–41)
Albumin: 3.7 g/dL (ref 3.5–5.0)
Alkaline Phosphatase: 101 U/L (ref 38–126)
Anion gap: 11 (ref 5–15)
BUN: 31 mg/dL — ABNORMAL HIGH (ref 8–23)
CALCIUM: 9.1 mg/dL (ref 8.9–10.3)
CO2: 20 mmol/L — ABNORMAL LOW (ref 22–32)
Chloride: 109 mmol/L (ref 98–111)
Creatinine, Ser: 1.46 mg/dL — ABNORMAL HIGH (ref 0.61–1.24)
GFR calc Af Amer: 50 mL/min — ABNORMAL LOW (ref >60)
GFR, EST NON AFRICAN AMERICAN: 44 mL/min — AB (ref >60)
Glucose, Bld: 107 mg/dL — ABNORMAL HIGH (ref 70–99)
Potassium: 4 mmol/L (ref 3.5–5.1)
Sodium: 140 mmol/L (ref 135–145)
Total Bilirubin: 1 mg/dL (ref 0.3–1.2)
Total Protein: 6.6 g/dL (ref 6.5–8.1)

## 2018-03-18 LAB — BLOOD GAS, ARTERIAL
Acid-Base Excess: 1.1 mmol/L (ref 0.0–2.0)
Bicarbonate: 24.5 mmol/L (ref 20.0–28.0)
Drawn by: 470591
FIO2: 21
O2 Saturation: 98.5 %
Patient temperature: 98.6
pCO2 arterial: 34.7 mmHg (ref 32.0–48.0)
pH, Arterial: 7.464 — ABNORMAL HIGH (ref 7.350–7.450)
pO2, Arterial: 124 mmHg — ABNORMAL HIGH (ref 83.0–108.0)

## 2018-03-18 LAB — URINALYSIS, ROUTINE W REFLEX MICROSCOPIC
Bilirubin Urine: NEGATIVE
Glucose, UA: NEGATIVE mg/dL
Hgb urine dipstick: NEGATIVE
Ketones, ur: NEGATIVE mg/dL
LEUKOCYTE UA: NEGATIVE
Nitrite: NEGATIVE
Protein, ur: NEGATIVE mg/dL
Specific Gravity, Urine: 1.012 (ref 1.005–1.030)
pH: 5 (ref 5.0–8.0)

## 2018-03-18 LAB — TYPE AND SCREEN
ABO/RH(D): O POS
Antibody Screen: NEGATIVE

## 2018-03-18 LAB — SURGICAL PCR SCREEN
MRSA, PCR: NEGATIVE
STAPHYLOCOCCUS AUREUS: NEGATIVE

## 2018-03-18 LAB — BRAIN NATRIURETIC PEPTIDE: B Natriuretic Peptide: 82.8 pg/mL (ref 0.0–100.0)

## 2018-03-18 LAB — HEMOGLOBIN A1C
Hgb A1c MFr Bld: 5.4 % (ref 4.8–5.6)
Mean Plasma Glucose: 108.28 mg/dL

## 2018-03-18 MED ORDER — SODIUM CHLORIDE 0.9 % IV SOLN
INTRAVENOUS | Status: DC
  Filled 2018-03-18: qty 30

## 2018-03-18 MED ORDER — NOREPINEPHRINE 4 MG/250ML-% IV SOLN
0.0000 ug/min | INTRAVENOUS | Status: DC
  Filled 2018-03-18: qty 250

## 2018-03-18 MED ORDER — SODIUM CHLORIDE 0.9 % IV SOLN
1.5000 g | INTRAVENOUS | Status: AC
  Administered 2018-03-19: 1.5 g via INTRAVENOUS
  Filled 2018-03-18 (×2): qty 1.5

## 2018-03-18 MED ORDER — MAGNESIUM SULFATE 50 % IJ SOLN
40.0000 meq | INTRAMUSCULAR | Status: DC
  Filled 2018-03-18: qty 9.85

## 2018-03-18 MED ORDER — POTASSIUM CHLORIDE 2 MEQ/ML IV SOLN
80.0000 meq | INTRAVENOUS | Status: DC
  Filled 2018-03-18: qty 40

## 2018-03-18 MED ORDER — DEXMEDETOMIDINE HCL IN NACL 400 MCG/100ML IV SOLN
0.1000 ug/kg/h | INTRAVENOUS | Status: AC
  Administered 2018-03-19: 1 ug/kg/h via INTRAVENOUS
  Filled 2018-03-18: qty 100

## 2018-03-18 MED ORDER — VANCOMYCIN HCL 10 G IV SOLR
1500.0000 mg | INTRAVENOUS | Status: AC
  Administered 2018-03-19: 1500 mg via INTRAVENOUS
  Filled 2018-03-18: qty 1500
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
## 2018-03-18 NOTE — Progress Notes (Signed)
Kirsten called from the lab and informed me that the patient's PTT and PT/INR have hemolyzed.  His surgery is tomorrow morning. I called and informed Theodosia Quay, RN at Dr. Antionette Char office and informed Myra Gianotti PA-C. Labs will need to be redrawn STAT tomorrow morning. Orders entered into EPIC

## 2018-03-18 NOTE — Anesthesia Preprocedure Evaluation (Addendum)
Anesthesia Evaluation  Patient identified by MRN, date of birth, ID band Patient awake    Reviewed: Allergy & Precautions, NPO status , Patient's Chart, lab work & pertinent test results  Airway Mallampati: IV  TM Distance: >3 FB Neck ROM: Limited    Dental no notable dental hx.    Pulmonary sleep apnea and Continuous Positive Airway Pressure Ventilation , former smoker,    Pulmonary exam normal breath sounds clear to auscultation       Cardiovascular hypertension, Pt. on medications and Pt. on home beta blockers + CAD and + Cardiac Stents  Normal cardiovascular exam+ Valvular Problems/Murmurs AS  Rhythm:Regular Rate:Normal + Systolic murmurs Echo 1/61/09: Study Conclusions Left ventricle: The cavity size was normal. Wall thickness was increased in a pattern of severe LVH. Systolic function was normal. The estimated ejection fraction was in the range of 60% to 65%. Wall motion was normal; there were no regional wall motion abnormalities. Doppler parameters are consistent with abnormal left ventricular relaxation (grade 1 diastolic dysfunction). Doppler parameters are consistent with high ventricular filling pressure. Aortic valve: Severely calcified annulus. Trileaflet; severely thickened leaflets. There was moderate to severe stenosis. Mean gradient by [facility] probe is 38 mmHg. Valve area (VTI): 0.92 cm^2. Valve area (Vmax): 0.96 cm^2. Valve area (Vmean): 0.98 cm^2. Mitral valve: Mildly calcified annulus. Mildly thickened leaflets. Left atrium: The atrium was mildly dilated.    Neuro/Psych Parkinson's disease Brain mass negative psych ROS   GI/Hepatic Neg liver ROS, GERD  Medicated and Controlled,  Endo/Other  negative endocrine ROS  Renal/GU negative Renal ROS     Musculoskeletal negative musculoskeletal ROS (+)   Abdominal   Peds  Hematology HLD   Anesthesia Other Findings Severe Aortic Stenosis  Reproductive/Obstetrics                          Anesthesia Physical Anesthesia Plan  ASA: IV  Anesthesia Plan: MAC   Post-op Pain Management:    Induction: Intravenous  PONV Risk Score and Plan: 1 and Ondansetron, Dexamethasone and Treatment may vary due to age or medical condition  Airway Management Planned: Simple Face Mask  Additional Equipment:   Intra-op Plan:   Post-operative Plan:   Informed Consent: I have reviewed the patients History and Physical, chart, labs and discussed the procedure including the risks, benefits and alternatives for the proposed anesthesia with the patient or authorized representative who has indicated his/her understanding and acceptance.     Dental advisory given  Plan Discussed with: CRNA  Anesthesia Plan Comments: (Reviewed PAT note written 03/18/2018 by Myra Gianotti, PA-C.  )       Anesthesia Quick Evaluation

## 2018-03-19 ENCOUNTER — Inpatient Hospital Stay (HOSPITAL_COMMUNITY): Payer: Medicare Other | Admitting: Anesthesiology

## 2018-03-19 ENCOUNTER — Encounter (HOSPITAL_COMMUNITY): Payer: Self-pay

## 2018-03-19 ENCOUNTER — Inpatient Hospital Stay (HOSPITAL_COMMUNITY): Payer: Medicare Other | Admitting: Vascular Surgery

## 2018-03-19 ENCOUNTER — Encounter (HOSPITAL_COMMUNITY)
Admission: RE | Disposition: A | Discharge status: Home / Self Care | Payer: Self-pay | Source: Home / Self Care | Attending: Surgery

## 2018-03-19 ENCOUNTER — Inpatient Hospital Stay (HOSPITAL_COMMUNITY): Payer: Medicare Other

## 2018-03-19 ENCOUNTER — Inpatient Hospital Stay (HOSPITAL_COMMUNITY)
Admission: RE | Admit: 2018-03-19 | Discharge: 2018-03-20 | DRG: 266 | Disposition: A | Discharge status: Home / Self Care | Payer: Medicare Other | Attending: Surgery | Admitting: Surgery

## 2018-03-19 ENCOUNTER — Ambulatory Visit: Payer: Medicare Other | Admitting: Neurology

## 2018-03-19 DIAGNOSIS — Z7902 Long term (current) use of antithrombotics/antiplatelets: Secondary | ICD-10-CM

## 2018-03-19 DIAGNOSIS — Z79899 Other long term (current) drug therapy: Secondary | ICD-10-CM | POA: Diagnosis not present

## 2018-03-19 DIAGNOSIS — G4733 Obstructive sleep apnea (adult) (pediatric): Secondary | ICD-10-CM | POA: Diagnosis present

## 2018-03-19 DIAGNOSIS — I2510 Atherosclerotic heart disease of native coronary artery without angina pectoris: Secondary | ICD-10-CM | POA: Diagnosis present

## 2018-03-19 DIAGNOSIS — Z87891 Personal history of nicotine dependence: Secondary | ICD-10-CM

## 2018-03-19 DIAGNOSIS — I714 Abdominal aortic aneurysm, without rupture: Secondary | ICD-10-CM | POA: Diagnosis present

## 2018-03-19 DIAGNOSIS — G4731 Primary central sleep apnea: Secondary | ICD-10-CM | POA: Diagnosis present

## 2018-03-19 DIAGNOSIS — Z85828 Personal history of other malignant neoplasm of skin: Secondary | ICD-10-CM

## 2018-03-19 DIAGNOSIS — I10 Essential (primary) hypertension: Secondary | ICD-10-CM | POA: Diagnosis present

## 2018-03-19 DIAGNOSIS — E7800 Pure hypercholesterolemia, unspecified: Secondary | ICD-10-CM | POA: Diagnosis present

## 2018-03-19 DIAGNOSIS — K8689 Other specified diseases of pancreas: Secondary | ICD-10-CM | POA: Diagnosis present

## 2018-03-19 DIAGNOSIS — G20 Parkinson's disease: Secondary | ICD-10-CM | POA: Diagnosis present

## 2018-03-19 DIAGNOSIS — I5033 Acute on chronic diastolic (congestive) heart failure: Secondary | ICD-10-CM | POA: Diagnosis present

## 2018-03-19 DIAGNOSIS — Z7982 Long term (current) use of aspirin: Secondary | ICD-10-CM | POA: Diagnosis not present

## 2018-03-19 DIAGNOSIS — E785 Hyperlipidemia, unspecified: Secondary | ICD-10-CM | POA: Diagnosis present

## 2018-03-19 DIAGNOSIS — I340 Nonrheumatic mitral (valve) insufficiency: Secondary | ICD-10-CM | POA: Diagnosis not present

## 2018-03-19 DIAGNOSIS — I350 Nonrheumatic aortic (valve) stenosis: Secondary | ICD-10-CM

## 2018-03-19 DIAGNOSIS — Z006 Encounter for examination for normal comparison and control in clinical research program: Secondary | ICD-10-CM

## 2018-03-19 DIAGNOSIS — K219 Gastro-esophageal reflux disease without esophagitis: Secondary | ICD-10-CM | POA: Diagnosis present

## 2018-03-19 DIAGNOSIS — Z952 Presence of prosthetic heart valve: Secondary | ICD-10-CM

## 2018-03-19 DIAGNOSIS — D329 Benign neoplasm of meninges, unspecified: Secondary | ICD-10-CM | POA: Diagnosis present

## 2018-03-19 DIAGNOSIS — I110 Hypertensive heart disease with heart failure: Secondary | ICD-10-CM | POA: Diagnosis present

## 2018-03-19 DIAGNOSIS — Z955 Presence of coronary angioplasty implant and graft: Secondary | ICD-10-CM

## 2018-03-19 DIAGNOSIS — R0989 Other specified symptoms and signs involving the circulatory and respiratory systems: Secondary | ICD-10-CM | POA: Diagnosis not present

## 2018-03-19 DIAGNOSIS — G609 Hereditary and idiopathic neuropathy, unspecified: Secondary | ICD-10-CM | POA: Diagnosis present

## 2018-03-19 HISTORY — PX: TEE WITHOUT CARDIOVERSION: SHX5443

## 2018-03-19 HISTORY — DX: Presence of prosthetic heart valve: Z95.2

## 2018-03-19 HISTORY — PX: TRANSCATHETER AORTIC VALVE REPLACEMENT, TRANSFEMORAL: SHX6400

## 2018-03-19 HISTORY — DX: Other specified diseases of pancreas: K86.89

## 2018-03-19 LAB — POCT I-STAT 7, (LYTES, BLD GAS, ICA,H+H)
Acid-base deficit: 2 mmol/L (ref 0.0–2.0)
BICARBONATE: 23.1 mmol/L (ref 20.0–28.0)
Calcium, Ion: 1.22 mmol/L (ref 1.15–1.40)
HCT: 37 % — ABNORMAL LOW (ref 39.0–52.0)
Hemoglobin: 12.6 g/dL — ABNORMAL LOW (ref 13.0–17.0)
O2 Saturation: 95 %
Potassium: 3.6 mmol/L (ref 3.5–5.1)
Sodium: 142 mmol/L (ref 135–145)
TCO2: 24 mmol/L (ref 22–32)
pCO2 arterial: 40.1 mmHg (ref 32.0–48.0)
pH, Arterial: 7.369 (ref 7.350–7.450)
pO2, Arterial: 76 mmHg — ABNORMAL LOW (ref 83.0–108.0)

## 2018-03-19 LAB — POCT I-STAT 4, (NA,K, GLUC, HGB,HCT)
GLUCOSE: 125 mg/dL — AB (ref 70–99)
Glucose, Bld: 110 mg/dL — ABNORMAL HIGH (ref 70–99)
HCT: 39 % (ref 39.0–52.0)
HCT: 38 % — ABNORMAL LOW (ref 39.0–52.0)
Hemoglobin: 13.3 g/dL (ref 13.0–17.0)
Hemoglobin: 12.9 g/dL — ABNORMAL LOW (ref 13.0–17.0)
Potassium: 3.6 mmol/L (ref 3.5–5.1)
Potassium: 3.6 mmol/L (ref 3.5–5.1)
Sodium: 141 mmol/L (ref 135–145)
Sodium: 141 mmol/L (ref 135–145)

## 2018-03-19 LAB — POCT ACTIVATED CLOTTING TIME
Activated Clotting Time: 142 seconds
Activated Clotting Time: 334 seconds
Activated Clotting Time: 131 seconds

## 2018-03-19 LAB — PROTIME-INR
INR: 1.1 (ref 0.8–1.2)
Prothrombin Time: 14.2 seconds (ref 11.4–15.2)

## 2018-03-19 LAB — APTT: aPTT: 44 seconds — ABNORMAL HIGH (ref 24–36)

## 2018-03-19 LAB — POCT I-STAT CREATININE: Creatinine, Ser: 1 mg/dL (ref 0.61–1.24)

## 2018-03-19 SURGERY — IMPLANTATION, AORTIC VALVE, TRANSCATHETER, FEMORAL APPROACH
Anesthesia: Monitor Anesthesia Care

## 2018-03-19 MED ORDER — CARBIDOPA-LEVODOPA 25-100 MG PO TABS
2.0000 | ORAL_TABLET | Freq: Three times a day (TID) | ORAL | Status: DC
  Administered 2018-03-19 – 2018-03-20 (×3): 2 via ORAL
  Filled 2018-03-19 (×4): qty 2

## 2018-03-19 MED ORDER — OXYCODONE HCL 5 MG PO TABS: 5.0000 mg | ORAL_TABLET | ORAL | Status: DC | PRN

## 2018-03-19 MED ORDER — HEPARIN (PORCINE) IN NACL 1000-0.9 UT/500ML-% IV SOLN
INTRAVENOUS | Status: DC | PRN
  Administered 2018-03-19 (×3): 500 mL

## 2018-03-19 MED ORDER — HEPARIN (PORCINE) IN NACL 1000-0.9 UT/500ML-% IV SOLN
INTRAVENOUS | Status: AC
  Filled 2018-03-19: qty 500

## 2018-03-19 MED ORDER — CARBIDOPA-LEVODOPA 25-100 MG PO TABS
1.0000 | ORAL_TABLET | Freq: Three times a day (TID) | ORAL | Status: DC
  Administered 2018-03-19 – 2018-03-20 (×2): 1 via ORAL
  Filled 2018-03-19 (×3): qty 1

## 2018-03-19 MED ORDER — PROPOFOL 500 MG/50ML IV EMUL
INTRAVENOUS | Status: DC | PRN
  Administered 2018-03-19: 40 ug/kg/min via INTRAVENOUS

## 2018-03-19 MED ORDER — ACETAMINOPHEN 500 MG PO TABS
1000.0000 mg | ORAL_TABLET | Freq: Once | ORAL | Status: AC
  Administered 2018-03-19: 1000 mg via ORAL
  Filled 2018-03-19: qty 2

## 2018-03-19 MED ORDER — ACETAMINOPHEN 500 MG PO TABS
ORAL_TABLET | ORAL | Status: AC
  Filled 2018-03-19: qty 2

## 2018-03-19 MED ORDER — IOPAMIDOL (ISOVUE-370) INJECTION 76%
INTRAVENOUS | Status: DC | PRN
  Administered 2018-03-19: 40 mL via INTRA_ARTERIAL

## 2018-03-19 MED ORDER — LATANOPROST 0.005 % OP SOLN
1.0000 [drp] | Freq: Every day | OPHTHALMIC | Status: DC
  Administered 2018-03-19: 1 [drp] via OPHTHALMIC
  Filled 2018-03-19: qty 2.5

## 2018-03-19 MED ORDER — ONDANSETRON HCL 4 MG/2ML IJ SOLN: 4.0000 mg | Freq: Four times a day (QID) | INTRAMUSCULAR | Status: DC | PRN

## 2018-03-19 MED ORDER — PRAMIPEXOLE DIHYDROCHLORIDE 0.25 MG PO TABS
0.5000 mg | ORAL_TABLET | Freq: Three times a day (TID) | ORAL | Status: DC
  Administered 2018-03-19 – 2018-03-20 (×4): 0.5 mg via ORAL
  Filled 2018-03-19 (×4): qty 2

## 2018-03-19 MED ORDER — SODIUM CHLORIDE 0.9 % IV SOLN
INTRAVENOUS | Status: DC
  Administered 2018-03-19: 50 mL/h via INTRAVENOUS

## 2018-03-19 MED ORDER — METOPROLOL TARTRATE 5 MG/5ML IV SOLN: 2.5000 mg | INTRAVENOUS | Status: DC | PRN

## 2018-03-19 MED ORDER — DORZOLAMIDE HCL-TIMOLOL MAL 2-0.5 % OP SOLN
1.0000 [drp] | Freq: Two times a day (BID) | OPHTHALMIC | Status: DC
  Administered 2018-03-19 – 2018-03-20 (×2): 1 [drp] via OPHTHALMIC
  Filled 2018-03-19: qty 10

## 2018-03-19 MED ORDER — HEPARIN SODIUM (PORCINE) 1000 UNIT/ML IJ SOLN
INTRAMUSCULAR | Status: DC | PRN
  Administered 2018-03-19: 15000 [IU] via INTRAVENOUS

## 2018-03-19 MED ORDER — CLOPIDOGREL BISULFATE 75 MG PO TABS
75.0000 mg | ORAL_TABLET | Freq: Every day | ORAL | Status: DC
  Administered 2018-03-19 – 2018-03-20 (×2): 75 mg via ORAL
  Filled 2018-03-19 (×2): qty 1

## 2018-03-19 MED ORDER — SODIUM CHLORIDE 0.9 % IV SOLN: 250.0000 mL | INTRAVENOUS | Status: DC | PRN

## 2018-03-19 MED ORDER — MORPHINE SULFATE (PF) 2 MG/ML IV SOLN: 1.0000 mg | INTRAVENOUS | Status: DC | PRN

## 2018-03-19 MED ORDER — ASPIRIN EC 81 MG PO TBEC
81.0000 mg | DELAYED_RELEASE_TABLET | Freq: Every day | ORAL | Status: DC
  Administered 2018-03-19 – 2018-03-20 (×2): 81 mg via ORAL
  Filled 2018-03-19 (×2): qty 1

## 2018-03-19 MED ORDER — ACETAMINOPHEN 650 MG RE SUPP: 650.0000 mg | Freq: Four times a day (QID) | RECTAL | Status: DC | PRN

## 2018-03-19 MED ORDER — ACETAMINOPHEN 325 MG PO TABS: 650.0000 mg | ORAL_TABLET | Freq: Four times a day (QID) | ORAL | Status: DC | PRN

## 2018-03-19 MED ORDER — LIDOCAINE HCL (PF) 1 % IJ SOLN
INTRAMUSCULAR | Status: DC | PRN
  Administered 2018-03-19: 20 mL

## 2018-03-19 MED ORDER — SODIUM CHLORIDE 0.9% FLUSH: 3.0000 mL | INTRAVENOUS | Status: DC | PRN

## 2018-03-19 MED ORDER — TRAMADOL HCL 50 MG PO TABS: 50.0000 mg | ORAL_TABLET | ORAL | Status: DC | PRN

## 2018-03-19 MED ORDER — PHENYLEPHRINE HCL-NACL 20-0.9 MG/250ML-% IV SOLN: 0.0000 ug/min | INTRAVENOUS | Status: DC

## 2018-03-19 MED ORDER — DEXAMETHASONE SODIUM PHOSPHATE 10 MG/ML IJ SOLN
INTRAMUSCULAR | Status: DC | PRN
  Administered 2018-03-19: 4 mg via INTRAVENOUS

## 2018-03-19 MED ORDER — SODIUM CHLORIDE 0.9 % IV SOLN
INTRAVENOUS | Status: DC
  Administered 2018-03-19: 11:00:00 via INTRAVENOUS

## 2018-03-19 MED ORDER — ONDANSETRON HCL 4 MG/2ML IJ SOLN
INTRAMUSCULAR | Status: DC | PRN
  Administered 2018-03-19: 4 mg via INTRAVENOUS

## 2018-03-19 MED ORDER — PANTOPRAZOLE SODIUM 20 MG PO TBEC
20.0000 mg | DELAYED_RELEASE_TABLET | Freq: Every day | ORAL | Status: DC
  Administered 2018-03-19 – 2018-03-20 (×2): 20 mg via ORAL
  Filled 2018-03-19 (×2): qty 1

## 2018-03-19 MED ORDER — FENTANYL CITRATE (PF) 100 MCG/2ML IJ SOLN
INTRAMUSCULAR | Status: DC | PRN
  Administered 2018-03-19: 25 ug via INTRAVENOUS
  Administered 2018-03-19: 50 ug via INTRAVENOUS

## 2018-03-19 MED ORDER — IOPAMIDOL (ISOVUE-370) INJECTION 76%
INTRAVENOUS | Status: AC
  Filled 2018-03-19: qty 125

## 2018-03-19 MED ORDER — CHLORHEXIDINE GLUCONATE 0.12 % MT SOLN
OROMUCOSAL | Status: AC
  Filled 2018-03-19: qty 15

## 2018-03-19 MED ORDER — ATORVASTATIN CALCIUM 40 MG PO TABS
40.0000 mg | ORAL_TABLET | Freq: Every day | ORAL | Status: DC
  Administered 2018-03-19: 40 mg via ORAL
  Filled 2018-03-19: qty 1

## 2018-03-19 MED ORDER — BRIMONIDINE TARTRATE 0.2 % OP SOLN
1.0000 [drp] | Freq: Three times a day (TID) | OPHTHALMIC | Status: DC
  Administered 2018-03-19 – 2018-03-20 (×2): 1 [drp] via OPHTHALMIC
  Filled 2018-03-19: qty 5

## 2018-03-19 MED ORDER — NITROGLYCERIN IN D5W 200-5 MCG/ML-% IV SOLN: 0.0000 ug/min | INTRAVENOUS | Status: DC

## 2018-03-19 MED ORDER — LACTATED RINGERS IV SOLN
INTRAVENOUS | Status: DC | PRN
  Administered 2018-03-19: 13:00:00 via INTRAVENOUS

## 2018-03-19 MED ORDER — PHENYLEPHRINE 40 MCG/ML (10ML) SYRINGE FOR IV PUSH (FOR BLOOD PRESSURE SUPPORT)
PREFILLED_SYRINGE | INTRAVENOUS | Status: DC | PRN
  Administered 2018-03-19 (×2): 80 ug via INTRAVENOUS
  Administered 2018-03-19 (×2): 40 ug via INTRAVENOUS

## 2018-03-19 MED ORDER — PROTAMINE SULFATE 10 MG/ML IV SOLN
INTRAVENOUS | Status: DC | PRN
  Administered 2018-03-19 (×3): 50 mg via INTRAVENOUS

## 2018-03-19 MED ORDER — VANCOMYCIN HCL IN DEXTROSE 1-5 GM/200ML-% IV SOLN
1000.0000 mg | Freq: Once | INTRAVENOUS | Status: AC
  Administered 2018-03-20: 1000 mg via INTRAVENOUS
  Filled 2018-03-19: qty 200

## 2018-03-19 MED ORDER — SODIUM CHLORIDE 0.9% FLUSH
3.0000 mL | Freq: Two times a day (BID) | INTRAVENOUS | Status: DC
  Administered 2018-03-20: 3 mL via INTRAVENOUS

## 2018-03-19 MED ORDER — FENTANYL CITRATE (PF) 100 MCG/2ML IJ SOLN
INTRAMUSCULAR | Status: AC
  Filled 2018-03-19: qty 2

## 2018-03-19 MED ORDER — LIDOCAINE HCL 1 % IJ SOLN
INTRAMUSCULAR | Status: AC
  Filled 2018-03-19: qty 20

## 2018-03-19 MED ORDER — SODIUM CHLORIDE 0.9 % IV SOLN
1.5000 g | Freq: Two times a day (BID) | INTRAVENOUS | Status: DC
  Administered 2018-03-20: 1.5 g via INTRAVENOUS
  Filled 2018-03-19 (×2): qty 1.5

## 2018-03-19 SURGICAL SUPPLY — 35 items
BAG SNAP BAND KOVER 36X36 (MISCELLANEOUS) ×4 IMPLANT
BLANKET WARM UNDERBOD FULL ACC (MISCELLANEOUS) ×2 IMPLANT
CABLE ADAPT PACING TEMP 12FT (ADAPTER) ×2 IMPLANT
CATH 29 EDWARDS DELIVERY SYS (CATHETERS) ×2 IMPLANT
CATH DIAG 6FR PIGTAIL ANGLED (CATHETERS) ×4 IMPLANT
CATH INFINITI 6F AL2 (CATHETERS) ×2 IMPLANT
CATH S G BIP PACING (CATHETERS) ×2 IMPLANT
CLOSURE MYNX CONTROL 6F/7F (Vascular Products) ×2 IMPLANT
CRIMPER (MISCELLANEOUS) ×2 IMPLANT
DEVICE CLOSURE PERCLS PRGLD 6F (VASCULAR PRODUCTS) IMPLANT
DEVICE INFLATION ATRION QL38 (MISCELLANEOUS) ×2 IMPLANT
ELECT DEFIB PAD ADLT CADENCE (PAD) ×2 IMPLANT
GUIDEWIRE SAFE TJ AMPLATZ EXST (WIRE) ×2 IMPLANT
KIT HEART LEFT (KITS) ×3 IMPLANT
KIT MICROPUNCTURE NIT STIFF (SHEATH) ×2 IMPLANT
PACK CARDIAC CATHETERIZATION (CUSTOM PROCEDURE TRAY) ×2 IMPLANT
PERCLOSE PROGLIDE 6F (VASCULAR PRODUCTS) ×6
SHEATH 16X36 EDWARDS (SHEATH) ×2 IMPLANT
SHEATH BRITE TIP 6FR 35CM (SHEATH) ×2 IMPLANT
SHEATH PINNACLE 6F 10CM (SHEATH) ×2 IMPLANT
SHEATH PINNACLE 8F 10CM (SHEATH) ×2 IMPLANT
SHEATH PROBE COVER 6X72 (BAG) ×2 IMPLANT
SLEEVE REPOSITIONING LENGTH 30 (MISCELLANEOUS) ×2 IMPLANT
STOPCOCK MORSE 400PSI 3WAY (MISCELLANEOUS) ×4 IMPLANT
SYR MEDRAD MARK V 150ML (SYRINGE) ×4 IMPLANT
TRANSDUCER W/STOPCOCK (MISCELLANEOUS) ×5 IMPLANT
TUBE CONN 8.8X1320 FR HP M-F (CONNECTOR) ×2 IMPLANT
TUBING ART PRESS 72  MALE/FEM (TUBING) ×2
TUBING ART PRESS 72 MALE/FEM (TUBING) IMPLANT
TUBING CIL FLEX 10 FLL-RA (TUBING) ×2 IMPLANT
VALVE HEART TRANSCATH SZ3 29MM (Valve) ×2 IMPLANT
WIRE AMPLATZ SS-J .035X180CM (WIRE) ×2 IMPLANT
WIRE EMERALD 3MM-J .035X150CM (WIRE) ×2 IMPLANT
WIRE EMERALD 3MM-J .035X260CM (WIRE) ×2 IMPLANT
WIRE EMERALD ST .035X260CM (WIRE) ×2 IMPLANT

## 2018-03-19 NOTE — Anesthesia Procedure Notes (Signed)
Procedure Name: MAC Date/Time: 03/19/2018 1:35 PM Performed by: Teressa Lower., CRNA Pre-anesthesia Checklist: Patient identified, Emergency Drugs available, Suction available, Patient being monitored and Timeout performed Patient Re-evaluated:Patient Re-evaluated prior to induction Oxygen Delivery Method: Simple face mask Ventilation: Oral airway inserted - appropriate to patient size

## 2018-03-19 NOTE — Progress Notes (Signed)
Pt has wound on left elbow.  Wound is covered with bandage.

## 2018-03-19 NOTE — Anesthesia Postprocedure Evaluation (Signed)
Anesthesia Post Note  Patient: [name]  Procedure(s) Performed: TRANSCATHETER AORTIC VALVE REPLACEMENT, TRANSFEMORAL (N/A ) TRANSESOPHAGEAL ECHOCARDIOGRAM (TEE) (N/A )     Patient location during evaluation: Cath Lab Anesthesia Type: MAC Level of consciousness: awake Pain management: pain level controlled Vital Signs Assessment: post-procedure vital signs reviewed and stable Respiratory status: spontaneous breathing, nonlabored ventilation, respiratory function stable and patient connected to nasal cannula oxygen Cardiovascular status: stable and blood pressure returned to baseline Postop Assessment: no apparent nausea or vomiting Anesthetic complications: no    Last Vitals:  Vitals:   03/19/18 1800 03/19/18 2000  BP: (!) 131/54 (!) 125/47  Pulse: 72 80  Resp: 16 (!) 28  Temp:  (!) 36.4 C  SpO2: 96% 95%    Last Pain:  Vitals:   03/19/18 2000  TempSrc: Oral  PainSc: 0-No pain                 Dontez Hauss P Jeorgia Helming

## 2018-03-19 NOTE — H&P (Signed)
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 as of 03/17/18 1314        2,790 mcg        cangrelor (KENGREAL) 50,000 mcg in sodium chloride 0.9 % 250 mL (200 mcg/mL) infusion (mcg/kg/min) 4 mcg/kg/min - 111.6 mL/hr New Bag/Given 02/19/18 1030   Dosing weight:  93 kg        Total dose as of 03/17/18 1314        Cannot be calculated  iohexol (OMNIPAQUE) 350 MG/ML injection (mL) 110 mL Given 02/19/18 1044   Total dose as of 03/17/18 1314        110 mL        Sedation Time   Sedation Time Physician-1: 1 hour 4 minutes 31 seconds  Coronary Findings   Diagnostic  Dominance: Right  Left Anterior Descending  Prox LAD to Mid LAD lesion 80% stenosed  Prox LAD to Mid LAD lesion is 80% stenosed. The lesion is calcified. Diffuse 80% lesion in the mid LAD is present. The lesion is interrogated with flow wire analysis and the DFR is positive at 0.87 (ischemic threshold less than or equal to 0.89).  First Diagonal Branch  1st Diag lesion 50% stenosed  1st Diag lesion is 50% stenosed.  Left Circumflex  First Obtuse Marginal  Branch  Ost 1st Mrg to 1st Mrg lesion 30% stenosed  Ost 1st Mrg to 1st Mrg lesion is 30% stenosed.  Right Coronary Artery  Mid RCA to Dist RCA lesion 99% stenosed  Mid RCA to Dist RCA lesion is 99% stenosed. TIMI-2 flow beyond lesion site with left-to-right collaterals  Right Posterior Descending Artery  Collaterals  RPDA filled by collaterals from 1st Sept.    First Right Posterolateral  Collaterals  1st RPLB filled by collaterals from 2nd Sept.    Intervention   Prox LAD to Mid LAD lesion  Stent  CATHETER LAUNCHER 6FREBU 3.5 guide catheter was inserted. Lesion crossed with guidewire using a GUIDEWIRE COMET PRESSURE. Pre-stent angioplasty was performed using a BALLOON SAPPHIRE 2.5X20. A drug-eluting stent was successfully placed using a STENT SYNERGY DES 3X38. Post-stent angioplasty was performed using a BALLOON SAPPHIRE [place] 3.5X18. Maximum pressure: 18 atm. PCI is uncomplicated. Heparin and cangrelor are used for anticoagulation. The lesion is predilated with a 2.5 mm balloon, stented with a 3.0 x 38 mm Synergy DES, and postdilated to high pressure with a 3.5 mm noncompliant balloon. After stenting, the DFR is 0.93. Angiographically there is 0% residual stenosis and TIMI-3 flow with a step down off the distal edge of the stent.  Post-Intervention Lesion Assessment  The intervention was successful. Pre-interventional TIMI flow is 3. Post-intervention TIMI flow is 3. No complications occurred at this lesion.  There is a 0% residual stenosis post intervention.  Left Heart   Aortic Valve There is severe aortic valve stenosis. The aortic valve is calcified. There is restricted aortic valve motion.  Coronary Diagrams   Diagnostic  Dominance: Right    Intervention     Implants    Permanent Stent  Stent Synergy Des 3x38 - [id number] - Implanted    Inventory item: Marella Bile DES 3X38 Model/Cat number: [id number]  Manufacturer: [employer] Lot number: [id number]  Device  identifier: [id number] Device identifier type: GS1  GUDID Information   Request status Successful    Brand name: SYNERGYT Version/Model: [id number]  Company name: [employer] MRI safety info as of 02/19/18: MR Conditional  Contains dry or latex rubber: No    GMDN P.T. name: Drug-eluting coronary artery stent, bioabsorbable-polymer-coated    As of 02/19/2018   Status: Implanted      Syngo Images      Show images for CARDIAC CATHETERIZATION  MERGE Images   Show images for CARDIAC CATHETERIZATION   Link to Procedure Log   Procedure Log    Hemo Data    Most Recent Value  Fick Cardiac Output 6.37 L/min  Fick Cardiac Output Index 3.02 (L/min)/BSA  Aortic Mean Gradient 30.3  mmHg  Aortic Peak Gradient 42 mmHg  Aortic Valve Area 1.11  Aortic Value Area Index 0.53 cm2/BSA  RA A Wave 6 mmHg  RA V Wave 5 mmHg  RA Mean 4 mmHg  RV Systolic Pressure 36 mmHg  RV Diastolic Pressure 1 mmHg  RV EDP 7 mmHg  PA Systolic Pressure 40 mmHg  PA Diastolic Pressure 16 mmHg  PA Mean 26 mmHg  PW A Wave 14 mmHg  PW V Wave 14 mmHg  PW Mean 11 mmHg  AO Systolic Pressure 115 mmHg  AO Diastolic Pressure 66 mmHg  AO Mean 88 mmHg  LV Systolic Pressure 161 mmHg  LV Diastolic Pressure 4 mmHg  LV EDP 10 mmHg  AOp Systolic Pressure 119 mmHg  AOp Diastolic Pressure 62 mmHg  AOp Mean Pressure 85 mmHg  LVp Systolic Pressure 161 mmHg  LVp Diastolic Pressure 4 mmHg  LVp EDP Pressure 10 mmHg  QP/QS 1  TPVR Index 8.61 HRUI  TSVR Index 29.14 HRUI  PVR SVR Ratio 0.18  TPVR/TSVR Ratio 0.3   ADDENDUM REPORT: 03/01/2018 10:03  CLINICAL DATA: Aortic stenosis  EXAM: Cardiac TAVR CT  TECHNIQUE: The patient was scanned on a Siemens Force 192 slice scanner. A 120 kV retrospective scan was triggered in the descending thoracic aorta at 111 HU's. Gantry rotation speed was 270 msecs and collimation was .9 mm. No beta blockade or nitro were given. The 3D data  set was reconstructed in 5% intervals of the R-R cycle. Systolic and diastolic phases were analyzed on a dedicated work station using MPR, MIP and VRT modes. The patient received 80 cc of contrast.  FINDINGS: Aortic Valve: Calcified tri leaflet with restricted leaflet motion  Aorta: Bovine arch no aneurysm moderate calcific atherosclerosis  Sinotubular Junction: 30 mm  Ascending Thoracic Aorta: 33 mm  Aortic Arch: 31 mm  Descending Thoracic Aorta: 24 mm  Sinus of Valsalva Measurements:  Non-coronary: 35.2 mm  Right - coronary: 36.1 mm  Left - coronary: 36.9 mm  Coronary Artery Height above Annulus:  Left Main: 16.2 mm above annulus  Right Coronary: 17.2 mm above annulus  Virtual Basal Annulus Measurements:  Maximum/Minimum Diameter: 31.8 mm x 28.2 mm  Perimeter: 93.6 mm  Area: 680 mm2  Coronary Arteries: Sufficient height above annulus for deployment  Optimum Fluoroscopic Angle for Delivery: LAO 7 Cranial 4 degrees  IMPRESSION: 1. Tri leaflet AV with annular area of 680 mm2 upper limits for a 29 mm Sapien 3 valve  2. Optimum angiographic angle for deployment LAO 7 Cranial 4 degrees  3. Patent stent in LAD and occluded distal RCA  4. Normal aortic root 3.3 cm with bovine arch  5. Coronary arteries sufficient height above annulus for deployment  Charlton Haws   Electronically Signed By: Charlton Haws M.D. On: 03/01/2018 10:03   Addended by Wendall Stade, MD on 03/01/2018 10:06 AM    Study Result   EXAM: OVER-READ INTERPRETATION CT CHEST  The following report is an over-read performed by radiologist Dr. Trudie Reed of [facility] on 02/28/2018. This over-read does not include interpretation of cardiac or coronary anatomy or pathology. The coronary calcium score/coronary CTA interpretation by the cardiologist is attached.  COMPARISON: Chest CT 03/27/2011.  FINDINGS: Extracardiac  findings will be described separately under dictation for contemporaneously obtained CTA chest, abdomen and pelvis.  IMPRESSION: Please see separate dictation for contemporaneously obtained CTA chest, abdomen and pelvis 02/28/2018 for full description of relevant extracardiac findings.  Electronically Signed: By: Brayton Mars.D.  On: 02/28/2018 15:38      CLINICAL DATA: 85 year old male with history of severe aortic stenosis. Preprocedural study prior to potential transcatheter aortic valve replacement (TAVR) procedure.  EXAM: CT ANGIOGRAPHY CHEST, ABDOMEN AND PELVIS  TECHNIQUE: Multidetector CT imaging through the chest, abdomen and pelvis was performed using the standard protocol during bolus administration of intravenous contrast. Multiplanar reconstructed images and MIPs were obtained and reviewed to evaluate the vascular anatomy.  CONTRAST: ISOVUE-370 IOPAMIDOL (ISOVUE-370) INJECTION 76%  COMPARISON: Chest CT 03/27/2011.  FINDINGS: CTA CHEST FINDINGS  Cardiovascular: Heart size is mildly enlarged. There is no significant pericardial fluid, thickening or pericardial calcification. There is aortic atherosclerosis, as well as atherosclerosis of the great vessels of the mediastinum and the coronary arteries, including calcified atherosclerotic plaque in the left main, left anterior descending, left circumflex and right coronary arteries. Severe thickening calcification of the aortic valve. 1.5 cm filling defect in the inferior aspect of the left atrium associated with the inferior interatrial septum, suspicious for small myxoma, or less likely a thrombus.  Mediastinum/Lymph Nodes: No pathologically enlarged mediastinal or hilar lymph nodes. Moderate-sized hiatal hernia. Patulous esophagus. No axillary lymphadenopathy.  Lungs/Pleura: Mild diffuse bronchial wall thickening with mild to moderate centrilobular and paraseptal emphysema. No  acute consolidative airspace disease. No pleural effusions. No suspicious appearing pulmonary nodules or masses are noted.  Musculoskeletal/Soft Tissues: There are no aggressive appearing lytic or blastic lesions noted in the visualized portions of the skeleton.  CTA ABDOMEN AND PELVIS FINDINGS  Hepatobiliary: Low-attenuation lesion in between segments 4A and 8 (axial image 80 of series 6) measuring 2.7 x 2.4 cm, compatible with a cyst. No suspicious hepatic lesions are otherwise noted. No intra or extrahepatic biliary ductal dilatation. Gallbladder is normal in appearance.  Pancreas: No definite pancreatic mass. There is diffuse pancreatic ductal dilatation, most evident in the head of the pancreas where the main pancreatic duct measures up to 8 mm shortly before the ampulla. No pancreatic or peripancreatic fluid collections or inflammatory changes.  Spleen: Unremarkable.  Adrenals/Urinary Tract: 2.1 cm low-attenuation lesion in the upper pole of the left kidney, compatible with a simple cyst. Right kidney and bilateral adrenal glands are normal in appearance. No hydroureteronephrosis. Urinary bladder is normal in appearance.  Stomach/Bowel: Normal appearance of the stomach. No pathologic dilatation of small bowel or colon. The appendix is not confidently identified and may be surgically absent. Regardless, there are no inflammatory changes noted adjacent to the cecum to suggest the presence of an acute appendicitis at this time.  Vascular/Lymphatic: Aortic atherosclerosis with vascular findings and measurements pertinent to potential TAVR procedure, as detailed below. Fusiform infrarenal abdominal aortic aneurysm measuring up to 4.5 x 4.9 cm (axial image 121 of series 6). 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 No lymphadenopathy noted in the abdomen or pelvis.  Reproductive: Prostate gland and  seminal vesicles are unremarkable in appearance.  Other: No significant volume of ascites. No pneumoperitoneum.  Musculoskeletal: Status post PLIF at L4-L5 with interbody grafts at L4-L5 interspace. There are no aggressive appearing lytic or blastic lesions noted in the visualized portions of the skeleton.  VASCULAR MEASUREMENTS PERTINENT TO TAVR:  AORTA:  Minimal Aortic Diameter-21 x 18 mm  Severity of Aortic Calcification-moderate  RIGHT PELVIS:  Right Common Iliac Artery -  Minimal Diameter-11.6 x 10.0 mm  Tortuosity-mild  Calcification-moderate  Right External Iliac Artery -  Minimal Diameter-8.7 x 8.1 mm  Tortuosity-moderate  Calcification-mild  Right Common Femoral Artery -  Minimal Diameter-8.6 x 7.7 mm  Tortuosity-mild  Calcification-moderate  LEFT PELVIS:  Left Common Iliac Artery -  Minimal Diameter-9.5 x 8.7 mm  Tortuosity-mild  Calcification-moderate  Left External Iliac Artery -  Minimal Diameter-9.6 x 9.2 mm  Tortuosity-moderate  Calcification-mild  Left Common Femoral Artery -  Minimal Diameter-8.1 x 6.9 mm  Tortuosity-mild  Calcification-moderate  Review of the MIP images confirms the above findings.  IMPRESSION: 1. Vascular findings and measurements pertinent to potential TAVR procedure, as detailed above. 2. Severe thickening calcification of the aortic valve, compatible with the reported clinical history of severe aortic stenosis. 3. 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 An interatrial thrombus is also possible, but is considered less likely. 4. Aortic atherosclerosis, in addition to left main and 3 vessel coronary artery disease. 5. Diffuse bronchial wall thickening with mild to moderate centrilobular and paraseptal emphysema; imaging findings suggestive of underlying  COPD. 6. Dilatation of the main pancreatic duct, most prominent in the region of the head of the pancreas. The possibility of main branch intraductal papillary mucinous neoplasm (IPMN) should be considered, although these findings are favored to simply be age related. Further evaluation with nonemergent MRI of the abdomen with and without IV gadolinium with MRCP is suggested. 7. Additional incidental findings, as above.   Electronically Signed By: Trudie Reed M.D. On: 02/28/2018 16:29   STS Risk Score: AVR + CABG  Risk of Mortality: 3.496% Renal failure: 4.205% Permanent stroke: 1.527% Prolonged ventilation: 16.404% DSW infection: 0.271% Reoperation: 6.401% Morbidity or Mortality: 23.921%   Impression:  This 85 year old gentleman has stage D, severe, symptomatic aortic stenosis with New York Heart Association class II symptoms of exertional fatigue and shortness of breath consistent with chronic diastolic congestive heart failure.  I have personally reviewed his 2D echocardiogram, cardiac catheterization, and CTA studies.  His echocardiogram shows a trileaflet aortic valve with severe thickening and calcification of the valve leaflets with restricted mobility and a mean gradient of 31 mmHg.  The dimensionless index is 0.21 and the valve area is 0.98 cm consistent with severe aortic stenosis.  Left ventricular systolic function is normal.  Cardiac catheterization showed an 80% mid LAD stenosis with abnormal pressure wire analysis which was successfully treated with a drug-eluting stent.  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  He has had no anginal symptoms and this will be managed with dual antiplatelet therapy for the stent and medical therapy for the residual disease in the right coronary system.  I agree that aortic valve replacement is indicated in this patient.  His STS risk score for open  surgical aortic valve replacement and coronary bypass surgery puts him in the low to moderate risk category but I think his risk would be significantly higher due to his advanced age and Parkinson's disease.  In addition he has a 7 cm intracranial tumor.  I think transcatheter aortic valve replacement would be the best treatment for him.  His gated cardiac CTA shows anatomy suitable for transcatheter aortic valve replacement using a Sapien 3 valve.  His abdominal and pelvic CTA shows adequate pelvic vascular anatomy to allow transfemoral insertion.  The patient was counseled at length regarding treatment alternatives for management of severe symptomatic aortic stenosis. The risks and benefits of surgical intervention have been discussed in detail. Long-term prognosis  with medical therapy was discussed. Alternative approaches such as conventional surgical aortic valve replacement, transcatheter aortic valve replacement, and palliative medical therapy were compared and contrasted at length. This discussion was placed in the context of the patient's own specific clinical presentation and past medical history. All of his questions have been addressed.   Following the decision to proceed with transcatheter aortic valve replacement, a discussion was held regarding what types of management strategies would be attempted intraoperatively in the event of life-threatening complications, including whether or not the patient would be considered a candidate for the use of cardiopulmonary bypass and/or conversion to open sternotomy for attempted surgical intervention. The patient is aware of the fact that transient use of cardiopulmonary bypass may be necessary.  I do not think he is a candidate for sternotomy to manage any intraoperative complications due to his age, Parkinson's disease, and intracranial tumor.  The patient has been advised of a variety of complications that might develop including but not limited to  risks of death, stroke, paravalvular leak, aortic dissection or other major vascular complications, aortic annulus rupture, device embolization, cardiac rupture or perforation, mitral regurgitation, acute myocardial infarction, arrhythmia, heart block or bradycardia requiring permanent pacemaker placement, congestive heart failure, respiratory failure, renal failure, pneumonia, infection, other late complications related to structural valve deterioration or migration, or other complications that might ultimately cause a temporary or permanent loss of functional independence or other long term morbidity. The patient provides full informed consent for the procedure as described and all questions were answered.    Plan:  Transfemoral transcatheter aortic valve replacement.   Alleen Borne, MD

## 2018-03-19 NOTE — Transfer of Care (Signed)
Immediate Anesthesia Transfer of Care Note  Patient: [name]  Procedure(s) Performed: TRANSCATHETER AORTIC VALVE REPLACEMENT, TRANSFEMORAL (N/A ) TRANSESOPHAGEAL ECHOCARDIOGRAM (TEE) (N/A )  Patient Location: Cath Lab  Anesthesia Type:MAC  Level of Consciousness: awake, alert  and oriented  Airway & Oxygen Therapy: Patient Spontanous Breathing and Patient connected to nasal cannula oxygen  Post-op Assessment: Report given to RN and Post -op Vital signs reviewed and stable  Post vital signs: Reviewed and stable  Last Vitals:  Vitals Value Taken Time  BP 127/61 03/19/2018  2:59 PM  Temp    Pulse 76 03/19/2018  3:01 PM  Resp 17 03/19/2018  3:01 PM  SpO2 92 % 03/19/2018  3:01 PM  Vitals shown include unvalidated device data.  Last Pain:  Vitals:   03/19/18 1043  TempSrc:   PainSc: 0-No pain         Complications: No apparent anesthesia complications

## 2018-03-19 NOTE — Anesthesia Procedure Notes (Signed)
Central Venous Catheter Insertion Performed by: Murvin Natal, MD, anesthesiologist Start/End3/03/2018 11:15 AM, 03/19/2018 11:30 AM Patient location: Pre-op. Preanesthetic checklist: patient identified, IV checked, site marked, risks and benefits discussed, surgical consent, monitors and equipment checked, pre-op evaluation, timeout performed and anesthesia consent Position: Trendelenburg Lidocaine 1% used for infiltration and patient sedated Hand hygiene performed , maximum sterile barriers used  and Seldinger technique used Catheter size: 8 Fr Total catheter length 16. Central line was placed.Double lumen Procedure performed using ultrasound guided technique. Ultrasound Notes:anatomy identified, needle tip was noted to be adjacent to the nerve/plexus identified, no ultrasound evidence of intravascular and/or intraneural injection and image(s) printed for medical record Attempts: 1 Following insertion, dressing applied, line sutured and Biopatch. Post procedure assessment: blood return through all ports, free fluid flow and no air  Patient tolerated the procedure well with no immediate complications.

## 2018-03-19 NOTE — Progress Notes (Signed)
Patient arrived from cath lab. B groins. Level 0, patient placed on monitor and CCMD made aware. Vital signs obtained. Will monitor patient. Stephaniemarie Stoffel, Bettina Gavia RN

## 2018-03-19 NOTE — Op Note (Signed)
[facility]   MULTIDISCIPLINARY HEART VALVE TEAM   TAVR OPERATIVE NOTE   Date of Procedure:  03/19/2018  Preoperative Diagnosis: Severe Aortic Stenosis   Postoperative Diagnosis: Same   Procedure:    Transcatheter Aortic Valve Replacement - Percutaneous  Transfemoral Approach  Edwards Sapien 3 THV (size 29 mm, model # 9600TFX, serial # [id number])   Co-Surgeons:  Gaye Pollack, MD and [name], MD  Anesthesiologist:  Belinda Block, MD  Echocardiographer:  Ena Dawley, MD  Pre-operative Echo Findings:  Severe aortic stenosis  Normal left ventricular systolic function  Post-operative Echo Findings:  No paravalvular leak  Normal/unchanged left ventricular systolic function  BRIEF CLINICAL NOTE AND INDICATIONS FOR SURGERY Please see the complete note of Dr. Cyndia Bent for full background history and indications for the procedure.  DETAILS OF THE OPERATIVE PROCEDURE  PREPARATION:   The patient is brought to the operating room on the above mentioned date and central monitoring was established by the anesthesia team including placement of a central venous catheter and radial arterial line. The patient is placed in the supine position on the operating table.  Intravenous antibiotics are administered. The patient is monitored closely throughout the procedure under conscious sedation.   Baseline transesophageal transthoracic echocardiogram is performed. The patient's chest, abdomen, both groins, and both lower extremities are prepared and draped in a sterile manner. A time out procedure is performed.   PERIPHERAL ACCESS:   Using ultrasound guidance, femoral arterial and venous access is obtained with placement of 6 Fr sheaths on the left side.  A pigtail diagnostic catheter was passed through the femoral arterial sheath under fluoroscopic guidance into the aortic root.  A temporary transvenous pacemaker catheter was passed through the femoral venous sheath  under fluoroscopic guidance into the right ventricle.  The pacemaker was tested to ensure stable lead placement and pacemaker capture. Aortic root angiography was performed in order to determine the optimal angiographic angle for valve deployment.  TRANSFEMORAL ACCESS:  A micropuncture technique is used to access the right femoral artery under fluoroscopic and ultrasound guidance.  2 Perclose devices are deployed at 10' and 2' positions to 'PreClose' the femoral artery. An 8 French sheath is placed and then an Amplatz Superstiff wire is advanced through the sheath. This is changed out for a 16 French transfemoral E-Sheath after progressively dilating over the Superstiff wire.  An AL-2 catheter was used to direct a straight-tip exchange length wire across the native aortic valve into the left ventricle. This was exchanged out for a pigtail catheter and position was confirmed in the LV apex. Simultaneous LV and Ao pressures were recorded.  The pigtail catheter was exchanged for an Amplatz Extra-stiff wire in the LV apex.    BALLOON AORTIC VALVULOPLASTY:  Not performed  TRANSCATHETER HEART VALVE DEPLOYMENT:  An Edwards Sapien 3 transcatheter heart valve (size 29 mm, model #9600TFX, serial #[id number]) was prepared and crimped per manufacturer's guidelines, and the proper orientation of the valve is confirmed on the [employer] delivery system. The valve was advanced through the introducer sheath using normal technique until in an appropriate position in the abdominal aorta beyond the sheath tip. The balloon was then retracted and using the fine-tuning wheel was centered on the valve. The valve was then advanced across the aortic arch using appropriate flexion of the catheter. The valve was carefully positioned across the aortic valve annulus. The Commander catheter was retracted using normal technique. Once final position of the valve has been confirmed by  angiographic assessment, the valve is deployed  while temporarily holding ventilation and during rapid ventricular pacing to maintain systolic blood pressure < 50 mmHg and pulse pressure < 10 mmHg. The balloon inflation is held for >3 seconds after reaching full deployment volume. Once the balloon has fully deflated the balloon is retracted into the ascending aorta and valve function is assessed using echocardiography. There is felt to be no paravalvular leak and no central aortic insufficiency.  The patient's hemodynamic recovery following valve deployment is good.  The deployment balloon and guidewire are both removed. Echo demostrated acceptable post-procedural gradients, stable mitral valve function, and no aortic insufficiency.   PROCEDURE COMPLETION:  The sheath was removed and femoral artery closure is performed using the 2 previously deployed Perclose devices.  Protamine is administered once femoral arterial repair was complete. The site is clear with no evidence of bleeding or hematoma after the sutures are tightened. The temporary pacemaker, pigtail catheters and femoral sheaths were removed with manual pressure used for hemostasis.   The patient tolerated the procedure well and is transported to the surgical intensive care in stable condition. There were no immediate intraoperative complications. All sponge instrument and needle counts are verified correct at completion of the operation.   The patient received a total of 40 mL of intravenous contrast during the procedure.   [name], MD 03/19/2018 7:56 PM

## 2018-03-19 NOTE — Progress Notes (Signed)
Echocardiogram 2D Echocardiogram has been performed.  [name] 03/19/2018, 2:41 PM

## 2018-03-19 NOTE — Op Note (Signed)
[facility]   MULTIDISCIPLINARY HEART VALVE TEAM   TAVR OPERATIVE NOTE   Date of Procedure:  03/19/2018  Preoperative Diagnosis: Severe Aortic Stenosis   Postoperative Diagnosis: Same   Procedure:    Transcatheter Aortic Valve Replacement - Percutaneous Right Transfemoral Approach  Edwards Sapien 3 THV (size 29 mm, model # 9600TFX, serial # [id number])   Co-Surgeons:  Gaye Pollack, MD and [name], MD   Anesthesiologist:  Belinda Block, MD  Echocardiographer:  Ena Dawley, MD  Pre-operative Echo Findings:  Severe aortic stenosis  Normal left ventricular systolic function  Post-operative Echo Findings:  No paravalvular leak  Normal left ventricular systolic function   BRIEF CLINICAL NOTE AND INDICATIONS FOR SURGERY  This 85 year old gentleman has stage D, severe, symptomatic aortic stenosis with New York Heart Association class II symptoms of exertional fatigue and shortness of breath consistent with chronic diastolic congestive heart failure. I have personally reviewed his 2D echocardiogram, cardiac catheterization, and CTA studies. His echocardiogram shows a trileaflet aortic valve with severe thickening and calcification of the valve leaflets with restricted mobility and a mean gradient of 31 mmHg. The dimensionless index is 0.21 and thevalve area is0.98 cm consistent with severe aortic stenosis.Left ventricular systolic function is normal. Cardiac catheterization showed an 80% mid LAD stenosis with abnormal pressure wire analysis which was successfully treated with a drug-eluting stent. 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 He has had no anginal symptoms and this will be managed with dual antiplatelet therapy for the stent and medical therapy for the residual disease in the right coronary system. I agree that aortic valve replacement is indicated in this  patient.His STS risk score for open surgical aortic valve replacement and coronary bypass surgery puts him in the low to moderate risk category but I think his risk would be significantly higher due to his advanced age and Parkinson's disease. In addition he has a 7 cm intracranial tumor. I think transcatheter aortic valve replacement would be the best treatment for him. His gated cardiac CTA shows anatomy suitable for transcatheter aortic valve replacement using a Sapien 3 valve.His abdominal and pelvic CTA shows adequate pelvic vascular anatomy to allow transfemoral insertion.  The patient was counseled at length regarding treatment alternatives for management of severe symptomatic aortic stenosis. The risks and benefits of surgical intervention havebeen discussed in detail. Long-term prognosis with medical therapy was discussed. Alternative approaches such as conventional surgical aortic valve replacement, transcatheter aortic valve replacement, and palliative medical therapy were compared and contrasted at length. This discussion was placed in the context of the patient's own specific clinical presentation and past medical history. All of hisquestions havebeen addressed.   Following the decision to proceed with transcatheter aortic valve replacement, a discussion was held regarding what types of management strategies would be attempted intraoperatively in the event of life-threatening complications, including whether or not the patient would be considered a candidate for the use of cardiopulmonary bypass and/or conversion to open sternotomy for attempted surgical intervention. The patient is aware of the fact that transient use of cardiopulmonary bypass may be necessary.I do not think he is a candidate for sternotomy to manage any intraoperative complications due to his age, Parkinson's disease, and intracranial tumor.  The patient has been advised of a variety of complications that might  develop including but not limited to risks of death, stroke, paravalvular leak, aortic dissection or other major vascular complications, aortic  annulus rupture, device embolization, cardiac rupture or perforation, mitral regurgitation, acute myocardial infarction, arrhythmia, heart block or bradycardia requiring permanent pacemaker placement, congestive heart failure, respiratory failure, renal failure, pneumonia, infection, other late complications related to structural valve deterioration or migration, or other complications that might ultimately cause a temporary or permanent loss of functional independence or other long term morbidity. The patient provides full informed consent for the procedure as described and all questions were answered.  DETAILS OF THE OPERATIVE PROCEDURE  PREPARATION:    The patient is brought to the operating room on the above mentioned date and central monitoring was established by the anesthesia team including placement of a central venous line and radial arterial line. The patient is placed in the supine position on the operating table.  Intravenous antibiotics are administered. The patient is monitored closely throughout the procedure under conscious sedation.    Baseline transthoracic echocardiogram was performed. The patient's chest, abdomen, both groins, and both lower extremities are prepared and draped in a sterile manner. A time out procedure is performed.   PERIPHERAL ACCESS:    Using the modified Seldinger technique, femoral arterial and venous access was obtained with placement of 6 Fr sheaths on the left side.  A pigtail diagnostic catheter was passed through the left arterial sheath under fluoroscopic guidance into the aortic root.  A temporary transvenous pacemaker catheter was passed through the left femoral venous sheath under fluoroscopic guidance into the right ventricle.  The pacemaker was tested to ensure stable lead placement and pacemaker capture.  Aortic root angiography was performed in order to determine the optimal angiographic angle for valve deployment.   TRANSFEMORAL ACCESS:   Percutaneous transfemoral access and sheath placement was performed using ultrasound guidance.  The right common femoral artery was cannulated using a micropuncture needle and appropriate location was verified using hand injection angiogram.  A pair of [employer] Perclose percutaneous closure devices were placed and a 6 French sheath replaced into the femoral artery.  The patient was heparinized systemically and ACT verified > 250 seconds.    A 16 Fr transfemoral E-sheath was introduced into the right femoral artery after progressively dilating over an Amplatz superstiff wire. An AL-2 catheter was used to direct a straight-tip exchange length wire across the native aortic valve into the left ventricle. This was exchanged out for a pigtail catheter and position was confirmed in the LV apex. Simultaneous LV and Ao pressures were recorded.  The pigtail catheter was exchanged for an Amplatz Extra-stiff wire in the LV apex.    BALLOON AORTIC VALVULOPLASTY:   Not performed  TRANSCATHETER HEART VALVE DEPLOYMENT:   An Edwards Sapien 3 transcatheter heart valve (size 29 mm, model #9600TFX, serial #[id number]) was prepared and crimped per manufacturer's guidelines, and the proper orientation of the valve is confirmed on the [employer] delivery system. The valve was advanced through the introducer sheath using normal technique until in an appropriate position in the abdominal aorta beyond the sheath tip. The balloon was then retracted and using the fine-tuning wheel was centered on the valve. The valve was then advanced across the aortic arch using appropriate flexion of the catheter. The valve was carefully positioned across the aortic valve annulus. The Commander catheter was retracted using normal technique. Once final position of the valve has been confirmed by  angiographic assessment, the valve is deployed while temporarily holding ventilation and during rapid ventricular pacing to maintain systolic blood pressure < 50 mmHg and pulse pressure < 10 mmHg. The balloon  inflation is held for >3 seconds after reaching full deployment volume. Once the balloon has fully deflated the balloon is retracted into the ascending aorta and valve function is assessed using echocardiography. There is felt to be no paravalvular leak and no central aortic insufficiency.  The patient's hemodynamic recovery following valve deployment is good.  The deployment balloon and guidewire are both removed.    PROCEDURE COMPLETION:   The sheath was removed and femoral artery closure performed using the previously placed Perclose devices.  Protamine was administered once femoral arterial repair was complete. The temporary pacemaker, pigtail catheters and left femoral venous sheath were removed with manual pressure used for hemostasis. A Mynx closure device was used for the left femoral artery.  The patient tolerated the procedure well and is transported to the surgical intensive care in stable condition. There were no immediate intraoperative complications. All sponge instrument and needle counts are verified correct at completion of the operation.   No blood products were administered during the operation.  The patient received a total of 40 mL of intravenous contrast during the procedure.   Gaye Pollack, MD 03/19/2018

## 2018-03-19 NOTE — Anesthesia Procedure Notes (Signed)
Arterial Line Insertion Start/End3/03/2018 11:20 AM, 03/19/2018 11:38 AM Performed by: Josephine Igo, CRNA, CRNA  Patient location: Pre-op. Preanesthetic checklist: patient identified, IV checked, risks and benefits discussed, surgical consent, monitors and equipment checked and timeout performed Lidocaine 1% used for infiltration and patient sedated Right, radial was placed Catheter size: 20 G Hand hygiene performed , maximum sterile barriers used  and Seldinger technique used  Attempts: 1 Procedure performed without using ultrasound guided technique. Following insertion, dressing applied and Biopatch. Post procedure assessment: normal  Patient tolerated the procedure well with no immediate complications.

## 2018-03-20 ENCOUNTER — Inpatient Hospital Stay (HOSPITAL_COMMUNITY): Payer: Medicare Other

## 2018-03-20 ENCOUNTER — Other Ambulatory Visit: Payer: Self-pay | Admitting: Physician Assistant

## 2018-03-20 ENCOUNTER — Encounter (HOSPITAL_COMMUNITY): Payer: Self-pay | Admitting: Cardiovascular Disease

## 2018-03-20 DIAGNOSIS — I340 Nonrheumatic mitral (valve) insufficiency: Secondary | ICD-10-CM

## 2018-03-20 DIAGNOSIS — Z952 Presence of prosthetic heart valve: Secondary | ICD-10-CM

## 2018-03-20 DIAGNOSIS — I5033 Acute on chronic diastolic (congestive) heart failure: Secondary | ICD-10-CM

## 2018-03-20 LAB — ECHOCARDIOGRAM COMPLETE
Height: 70 in
Weight: 3241.64 oz

## 2018-03-20 LAB — CBC
HCT: 39.3 % (ref 39.0–52.0)
Hemoglobin: 13.7 g/dL (ref 13.0–17.0)
MCH: 35.3 pg — ABNORMAL HIGH (ref 26.0–34.0)
MCHC: 34.9 g/dL (ref 30.0–36.0)
MCV: 101.3 fL — AB (ref 80.0–100.0)
Platelets: 126 10*3/uL — ABNORMAL LOW (ref 150–400)
RBC: 3.88 MIL/uL — ABNORMAL LOW (ref 4.22–5.81)
RDW: 13.9 % (ref 11.5–15.5)
WBC: 9.1 10*3/uL (ref 4.0–10.5)
nRBC: 0 % (ref 0.0–0.2)

## 2018-03-20 LAB — BASIC METABOLIC PANEL
Anion gap: 6 (ref 5–15)
BUN: 26 mg/dL — ABNORMAL HIGH (ref 8–23)
CALCIUM: 8.7 mg/dL — AB (ref 8.9–10.3)
CO2: 25 mmol/L (ref 22–32)
Chloride: 108 mmol/L (ref 98–111)
Creatinine, Ser: 1.11 mg/dL (ref 0.61–1.24)
GFR calc Af Amer: >60 mL/min (ref >60)
GFR calc non Af Amer: >60 mL/min (ref >60)
Glucose, Bld: 115 mg/dL — ABNORMAL HIGH (ref 70–99)
Potassium: 3.6 mmol/L (ref 3.5–5.1)
Sodium: 139 mmol/L (ref 135–145)

## 2018-03-20 LAB — MAGNESIUM: Magnesium: 1.9 mg/dL (ref 1.7–2.4)

## 2018-03-20 MED ORDER — TRAMADOL HCL 50 MG PO TABS: 50.0000 mg | ORAL_TABLET | ORAL | Status: DC | PRN

## 2018-03-20 MED FILL — Lidocaine HCl Local Inj 1%: INTRAMUSCULAR | Qty: 20 | Status: AC

## 2018-03-20 NOTE — Progress Notes (Signed)
03/20/2018 1315 Discharge AVS meds taken today and those due this evening reviewed.  Follow-up appointments and when to call md reviewed.  D/C IV and TELE.  Questions and concerns addressed.   D/C home per orders. [place]

## 2018-03-20 NOTE — Progress Notes (Signed)
03/20/2018 10:36 AM [facility] in [place] D/C'D.  Pt tolerated well. [place]

## 2018-03-20 NOTE — Progress Notes (Signed)
1 Day Post-Op Procedure(s) (LRB): TRANSCATHETER AORTIC VALVE REPLACEMENT, TRANSFEMORAL (N/A) TRANSESOPHAGEAL ECHOCARDIOGRAM (TEE) (N/A) Subjective: Feels fine. Sitting up in chair. Ambulated this am and says his breathing and stamina are better already.  Objective: Vital signs in last 24 hours: Temp:  [97.4 F (36.3 C)-98.9 F (37.2 C)] 97.4 F (36.3 C) (03/04 0529) Pulse Rate:  [61-149] 93 (03/04 0600) Cardiac Rhythm: Normal sinus rhythm (03/04 0529) Resp:  [9-19] 12 (03/04 0600) BP: (120-151)/(45-77) 134/73 (03/04 0529) SpO2:  [87 %-97 %] 93 % (03/04 0600) Weight:  [91.9 kg-93.8 kg] 91.9 kg (03/04 0500)  Hemodynamic parameters for last 24 hours:    Intake/Output from previous day: 03/03 0701 - 03/04 0700 In: 926.1 [P.O.:360; I.V.:266.1; IV Piggyback:300] Out: 1025 [Urine:1025] Intake/Output this shift: No intake/output data recorded.  General appearance: alert and cooperative Neurologic: intact,  Parkinson's tremor  Heart: regular rate and rhythm, S1, S2 normal, no murmur, click, rub or gallop Lungs: clear to auscultation bilaterally Wound: groin sites look good.  Lab Results: Recent Labs    03/18/18 1324  03/19/18 1516 03/20/18 0533  WBC 8.2  --   --  9.1  HGB 15.0   < > 12.6* 13.7  HCT 43.2   < > 37.0* 39.3  PLT 176  --   --  126*   < > = values in this interval not displayed.   BMET:  Recent Labs    03/18/18 1324  03/19/18 1438 03/19/18 1516 03/19/18 1520 03/20/18 0533  NA 140   < > 141 142  --  139  K 4.0   < > 3.6 3.6  --  3.6  CL 109  --   --   --   --  108  CO2 20*  --   --   --   --  25  GLUCOSE 107*   < > 125*  --   --  115*  BUN 31*  --   --   --   --  26*  CREATININE 1.46*  --   --   --  1.00 1.11  CALCIUM 9.1  --   --   --   --  8.7*   < > = values in this interval not displayed.    PT/INR:  Recent Labs    03/19/18 1018  LABPROT 14.2  INR 1.1   ABG    Component Value Date/Time   PHART 7.369 03/19/2018 1516   HCO3 23.1  03/19/2018 1516   TCO2 24 03/19/2018 1516   ACIDBASEDEF 2.0 03/19/2018 1516   O2SAT 95.0 03/19/2018 1516   CBG (last 3)  No results for input(s): GLUCAP in the last 72 hours.  Assessment/Plan: S/P Procedure(s) (LRB): TRANSCATHETER AORTIC VALVE REPLACEMENT, TRANSFEMORAL (N/A) TRANSESOPHAGEAL ECHOCARDIOGRAM (TEE) (N/A)  POD 1 Hemodynamically stable in sinus rhythm. Telemetry has only shown a few PVC's. ECG shows sinus 70's with no block. Parkinson's symptoms stable on his previous meds. 2D echo done and pending. On ASA and Plavix. He would like to go home today and I think that is fine. His wife will be at home and daughter will take him home.   LOS: 1 day    Gaye Pollack 03/20/2018

## 2018-03-20 NOTE — Discharge Instructions (Signed)

## 2018-03-20 NOTE — Progress Notes (Signed)
CARDIAC REHAB PHASE I   PRE:  Rate/Rhythm: 76 SR     SaO2: 92 RA  MODE:  Ambulation: 240 ft 100 peak HR  POST:  Rate/Rhythm: 79 SR  BP:  Sitting: 146/66    SaO2: 88 RA --> 92 RA  Pt ambulated 240ft in hallway assist of one with gait belt. Pt continues with unsteady gait, but declines use of walker. Pt and family educated on site care. Reviewed restrictions and encouraged ambulation as able with emphasis on safety. Pt previously referred to [facility] [place].   [id number] Rufina Falco, RN BSN 03/20/2018 11:28 AM

## 2018-03-20 NOTE — Progress Notes (Signed)
  Echocardiogram 2D Echocardiogram has been performed.  [name] 03/20/2018, 10:22 AM

## 2018-03-20 NOTE — Discharge Summary (Addendum)
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 the above findings. IMPRESSION: 1. Vascular findings and measurements pertinent to potential TAVR procedure, as detailed above. 2. Severe thickening calcification of the aortic valve, compatible with the reported clinical history of severe aortic stenosis. 3. 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 An interatrial thrombus is also possible, but is considered less likely. 4. Aortic atherosclerosis, in addition to left main and 3 vessel coronary artery disease. 5. Diffuse bronchial wall thickening with mild to moderate centrilobular and paraseptal emphysema; imaging findings suggestive of underlying COPD. 6. Dilatation of the main pancreatic duct, most prominent in the region of the head of the pancreas. The possibility of main branch intraductal papillary mucinous neoplasm (IPMN) should be considered, although these findings are favored to simply be age related. Further evaluation with nonemergent MRI of the abdomen with and without IV gadolinium with MRCP is suggested. 7. Additional incidental  findings, as above. Electronically Signed   By: Vinnie Langton M.D.   On: 02/28/2018 16:29   Dg Hip Unilat W Or Wo Pelvis 2-3 Views Left  Result Date: 02/28/2018 CLINICAL DATA:  [place].  Left hip pain. EXAM: DG HIP (WITH OR WITHOUT PELVIS) 2-3V LEFT COMPARISON:  None. FINDINGS: Both hips are normally located. Mild degenerative changes but no acute fracture  or AVN. The pubic symphysis and SI joints are intact. Lower lumbar fusion hardware noted. Contrast noted in the bladder from a prior CT scan. IMPRESSION: No acute bony findings. Electronically Signed   By: Marijo Sanes M.D.   On: 02/28/2018 15:27   Vas US Carotid  Result Date: 03/13/2018 Carotid Arterial Duplex Study Indications: Pre TAVR. Performing Technologist: June Leap RDMS, RVT  Examination Guidelines: A complete evaluation includes B-mode imaging, spectral Doppler, color Doppler, and power Doppler as needed of all accessible portions of each vessel. Bilateral testing is considered an integral part of a complete examination. Limited examinations for reoccurring indications may be performed as noted.  Right Carotid Findings: +----------+--------+--------+--------+------------+--------+           PSV cm/sEDV cm/sStenosisDescribe    Comments +----------+--------+--------+--------+------------+--------+ CCA Prox  72      16                                   +----------+--------+--------+--------+------------+--------+ CCA Distal75      19                                   +----------+--------+--------+--------+------------+--------+ ICA Prox  69      14      1-39%   heterogenous         +----------+--------+--------+--------+------------+--------+ ICA Distal65      18                                   +----------+--------+--------+--------+------------+--------+ ECA       104     19                                   +----------+--------+--------+--------+------------+--------+  +----------+--------+-------+----------------+-------------------+           PSV cm/sEDV cmsDescribe        Arm Pressure (mmHG) +----------+--------+-------+----------------+-------------------+ [id number]             Multiphasic, WNL                    +----------+--------+-------+----------------+-------------------+ +---------+--------+--+--------+-+---------+ VertebralPSV cm/s42EDV cm/s7Antegrade +---------+--------+--+--------+-+---------+  Left Carotid Findings: +----------+--------+--------+--------+------------+--------+           PSV cm/sEDV cm/sStenosisDescribe    Comments +----------+--------+--------+--------+------------+--------+ CCA Prox  83      19                                   +----------+--------+--------+--------+------------+--------+ CCA Distal52      9                                    +----------+--------+--------+--------+------------+--------+ ICA Prox  45      14      1-39%   heterogenous         +----------+--------+--------+--------+------------+--------+ ICA Distal81      21                                   +----------+--------+--------+--------+------------+--------+ ECA       66  11                                   +----------+--------+--------+--------+------------+--------+ +----------+--------+--------+----------------+-------------------+ SubclavianPSV cm/sEDV cm/sDescribe        Arm Pressure (mmHG) +----------+--------+--------+----------------+-------------------+           86              Multiphasic, WNL                    +----------+--------+--------+----------------+-------------------+ +---------+--------+--+--------+-+---------+ VertebralPSV cm/s38EDV cm/s1Antegrade +---------+--------+--+--------+-+---------+  Summary: Right Carotid: Velocities in the right ICA are consistent with a 1-39% stenosis. Left Carotid: Velocities in the left ICA are consistent with a 1-39% stenosis.   *See table(s) above for measurements and observations.  Electronically signed by Curt Jews MD on 03/13/2018 at 1:23:49 PM.    Final    Ct Angio Abdomen Pelvis  W &/or Wo Contrast  Result Date: 02/28/2018 CLINICAL DATA:  85 year old male with history of severe aortic stenosis. Preprocedural study prior to potential transcatheter aortic valve replacement (TAVR) procedure. EXAM: CT ANGIOGRAPHY CHEST, ABDOMEN AND PELVIS TECHNIQUE: Multidetector CT imaging through the chest, abdomen and pelvis was performed using the standard protocol during bolus administration of intravenous contrast. Multiplanar reconstructed images and MIPs were obtained and reviewed to evaluate the vascular anatomy. CONTRAST:  130mL ISOVUE-370 IOPAMIDOL (ISOVUE-370) INJECTION 76% COMPARISON:  Chest CT 03/27/2011. FINDINGS: CTA CHEST FINDINGS Cardiovascular: Heart size is mildly enlarged. There is no significant pericardial fluid, thickening or pericardial calcification. There is aortic atherosclerosis, as well as atherosclerosis of the great vessels of the mediastinum and the coronary arteries, including calcified atherosclerotic plaque in the left main, left anterior descending, left circumflex and right coronary arteries. Severe thickening calcification of the aortic valve. 1.5 cm filling defect in the inferior aspect of the left atrium associated with the inferior interatrial septum, suspicious for small myxoma, or less likely a thrombus. Mediastinum/Lymph Nodes: No pathologically enlarged mediastinal or hilar lymph nodes. Moderate-sized hiatal hernia. Patulous esophagus. No axillary lymphadenopathy. Lungs/Pleura: Mild diffuse bronchial wall thickening with mild to moderate centrilobular and paraseptal emphysema. No acute consolidative airspace disease. No pleural effusions. No suspicious appearing pulmonary nodules or masses are noted. Musculoskeletal/Soft Tissues: There are no aggressive appearing lytic or blastic lesions noted in the  visualized portions of the skeleton. CTA ABDOMEN AND PELVIS FINDINGS Hepatobiliary: Low-attenuation lesion in between segments 4A and 8 (axial image 80 of series 6) measuring 2.7 x 2.4 cm, compatible with a cyst. No suspicious hepatic lesions are otherwise noted. No intra or extrahepatic biliary ductal dilatation. Gallbladder is normal in appearance. Pancreas: No definite pancreatic mass. There is diffuse pancreatic ductal dilatation, most evident in the head of the pancreas where the main pancreatic duct measures up to 8 mm shortly before the ampulla. No pancreatic or peripancreatic fluid collections or inflammatory changes. Spleen: Unremarkable. Adrenals/Urinary Tract: 2.1 cm low-attenuation lesion in the upper pole of the left kidney, compatible with a simple cyst. Right kidney and bilateral adrenal glands are normal in appearance. No hydroureteronephrosis. Urinary bladder is normal in appearance. Stomach/Bowel: Normal appearance of the stomach. No pathologic dilatation of small bowel or colon. The appendix is not confidently identified and may be surgically absent. Regardless, there are no inflammatory changes noted adjacent to the cecum to suggest the presence of an acute appendicitis at this time. Vascular/Lymphatic: Aortic atherosclerosis with vascular findings and measurements pertinent to potential TAVR procedure, as detailed below.  Fusiform infrarenal abdominal aortic aneurysm measuring up to 4.5 x 4.9 cm (axial image 121 of series 6). 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 No lymphadenopathy noted in the abdomen or pelvis. Reproductive: Prostate gland and seminal vesicles are unremarkable in appearance. Other: No significant volume of ascites.  No pneumoperitoneum. Musculoskeletal: Status post PLIF at L4-L5 with interbody grafts at L4-L5 interspace. There are no aggressive appearing lytic or blastic lesions noted in the visualized portions  of the skeleton. VASCULAR MEASUREMENTS PERTINENT TO TAVR: AORTA: Minimal Aortic Diameter-21 x 18 mm Severity of Aortic Calcification-moderate RIGHT PELVIS: Right Common Iliac Artery - Minimal Diameter-11.6 x 10.0 mm Tortuosity-mild Calcification-moderate Right External Iliac Artery - Minimal Diameter-8.7 x 8.1 mm Tortuosity-moderate Calcification-mild Right Common Femoral Artery - Minimal Diameter-8.6 x 7.7 mm Tortuosity-mild Calcification-moderate LEFT PELVIS: Left Common Iliac Artery - Minimal Diameter-9.5 x 8.7 mm Tortuosity-mild Calcification-moderate Left External Iliac Artery - Minimal Diameter-9.6 x 9.2 mm Tortuosity-moderate Calcification-mild Left Common Femoral Artery - Minimal Diameter-8.1 x 6.9 mm Tortuosity-mild Calcification-moderate Review of the MIP images confirms the above findings. IMPRESSION: 1. Vascular findings and measurements pertinent to potential TAVR procedure, as detailed above. 2. Severe thickening calcification of the aortic valve, compatible with the reported clinical history of severe aortic stenosis. 3. 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 An interatrial thrombus is also possible, but is considered less likely. 4. Aortic atherosclerosis, in addition to left main and 3 vessel coronary artery disease. 5. Diffuse bronchial wall thickening with mild to moderate centrilobular and paraseptal emphysema; imaging findings suggestive of underlying COPD. 6. Dilatation of the main pancreatic duct, most prominent in the region of the head of the pancreas. The possibility of main branch intraductal papillary mucinous neoplasm (IPMN) should be considered, although these findings are favored to simply be age related. Further evaluation with nonemergent MRI of the abdomen with and without IV gadolinium with MRCP is suggested. 7. Additional incidental findings, as above. Electronically  Signed   By: Vinnie Langton M.D.   On: 02/28/2018 16:29   Disposition   Pt is being discharged home today in good condition.  Follow-up Plans & Appointments    Follow-up Information    Eileen Stanford, PA-C. Go on 03/28/2018.   Specialties:  Cardiology, Radiology Why:  @ 3:30pm, please arrive 10 minutes early Contact information: [place] [address] [phone]            Discharge Medications   Allergies as of 03/20/2018   No Known Allergies     Medication List    STOP taking these medications   amLODipine 5 MG tablet Commonly known as:  NORVASC     TAKE these medications   aspirin EC 81 MG tablet Take 1 tablet (81 mg total) by mouth daily.   atorvastatin 40 MG tablet Commonly known as:  LIPITOR Take 1 tablet (40 mg total) by mouth daily at 6 PM.   brimonidine 0.2 % ophthalmic solution Commonly known as:  ALPHAGAN Place 1 drop into both eyes 3 (three) times daily.   calcium gluconate 500 MG tablet Take 500 mg by mouth daily.   carbidopa-levodopa 25-100 MG tablet Commonly known as:  SINEMET IR Take 2 at 6, 1 at 9, 2 at 12PM, 1 at 3PM, 2 pills at [place], and 1 at 9 PM What changed:  how much to take  how to take this  when to take this  additional instructions   clopidogrel 75 MG tablet Commonly known as:  PLAVIX Take 1 tablet (75 mg total) by mouth daily with breakfast.   dorzolamide-timolol 22.3-6.8 MG/ML ophthalmic solution Commonly known as:  COSOPT Place 1 drop into both eyes 2 (two) times daily.   glucosamine-chondroitin 500-400 MG tablet Take 1 tablet by mouth daily.   latanoprost 0.005 % ophthalmic solution Commonly known as:  XALATAN Place 1 drop into both eyes at bedtime.   losartan-hydrochlorothiazide 100-25 MG tablet Commonly known as:  HYZAAR Take 1 tablet by mouth daily.   metoprolol tartrate 25 MG tablet Commonly known as:  LOPRESSOR Take 0.5 tablets (12.5 mg total) by mouth 2 (two)  times daily.   multivitamin tablet Take 1 tablet by mouth daily.   nitroGLYCERIN 0.4 MG SL tablet Commonly known as:  NITROSTAT Place 1 tablet (0.4 mg total) under the tongue every 5 (five) minutes as needed.   pantoprazole 20 MG tablet Commonly known as:  PROTONIX Take 1 tablet (20 mg total) by mouth daily.   pramipexole 0.5 MG tablet Commonly known as:  MIRAPEX Take 1 tablet (0.5 mg total) by mouth 3 (three) times daily.   vitamin C 500 MG tablet Commonly known as:  ASCORBIC ACID Take 500 mg by mouth daily.        Outstanding Labs/Studies   BMET  Duration of Discharge Encounter   Greater than 30 minutes including physician time.  Mable Fill, PA-C 03/20/2018, 8:41 AM [phone]

## 2018-03-21 ENCOUNTER — Telehealth: Payer: Self-pay | Admitting: Physician Assistant

## 2018-03-21 NOTE — Telephone Encounter (Signed)
  [place] VALVE TEAM   Patient contacted regarding discharge from [facility] on 03/20/18  Patient understands to follow up with provider Nell Range on 3/12 at [facility].  Patient understands discharge instructions? yes Patient understands medications and regiment? yes Patient understands to bring all medications to this visit? Yes  Had a little dizziness this AM that has now resolved. Otherwise, feeling well.    Angelena Form PA-C  [facility]

## 2018-03-25 DIAGNOSIS — D329 Benign neoplasm of meninges, unspecified: Secondary | ICD-10-CM | POA: Diagnosis not present
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
## 2018-03-28 ENCOUNTER — Encounter: Payer: Self-pay | Admitting: Physician Assistant

## 2018-03-28 ENCOUNTER — Other Ambulatory Visit: Payer: Self-pay | Admitting: Physician Assistant

## 2018-03-28 ENCOUNTER — Other Ambulatory Visit: Payer: Self-pay

## 2018-03-28 ENCOUNTER — Ambulatory Visit (INDEPENDENT_AMBULATORY_CARE_PROVIDER_SITE_OTHER): Payer: Medicare Other | Admitting: Physician Assistant

## 2018-03-28 VITALS — BP 138/64 | HR 69 | Ht 70.0 in | Wt 214.0 lb

## 2018-03-28 DIAGNOSIS — G20 Parkinson's disease: Secondary | ICD-10-CM

## 2018-03-28 DIAGNOSIS — I2510 Atherosclerotic heart disease of native coronary artery without angina pectoris: Secondary | ICD-10-CM

## 2018-03-28 DIAGNOSIS — Z952 Presence of prosthetic heart valve: Secondary | ICD-10-CM

## 2018-03-28 DIAGNOSIS — I5032 Chronic diastolic (congestive) heart failure: Secondary | ICD-10-CM | POA: Diagnosis not present

## 2018-03-28 DIAGNOSIS — I10 Essential (primary) hypertension: Secondary | ICD-10-CM

## 2018-03-28 DIAGNOSIS — K8689 Other specified diseases of pancreas: Secondary | ICD-10-CM | POA: Diagnosis not present

## 2018-03-28 DIAGNOSIS — G20A1 Parkinson's disease without dyskinesia, without mention of fluctuations: Secondary | ICD-10-CM

## 2018-03-28 MED ORDER — FUROSEMIDE 20 MG PO TABS: 20.0000 mg | ORAL_TABLET | Freq: Every day | ORAL | 0 refills | Status: DC

## 2018-03-28 MED ORDER — AMOXICILLIN 500 MG PO TABS: ORAL_TABLET | ORAL | 6 refills | Status: DC

## 2018-03-28 MED ORDER — AMLODIPINE BESYLATE 5 MG PO TABS: 5.0000 mg | ORAL_TABLET | Freq: Every day | ORAL | 3 refills | Status: DC

## 2018-03-28 NOTE — Patient Instructions (Addendum)
Medication Instructions:  1) Take LASIX 20 mg once daily for 3 days only. 2) Your provider discussed the importance of taking an antibiotic prior to all dental visits to prevent damage to the heart valves from infection. You were given a prescription for AMOXIL 2,000 mg to take one hour prior to any dental appointment.   Labwork: TODAY: BMET, BNP  Follow-Up: Please keep your upcoming follow-up appointments!

## 2018-03-29 DIAGNOSIS — I491 Atrial premature depolarization: Secondary | ICD-10-CM | POA: Diagnosis not present

## 2018-03-29 DIAGNOSIS — R58 Hemorrhage, not elsewhere classified: Secondary | ICD-10-CM | POA: Diagnosis not present

## 2018-03-29 DIAGNOSIS — W19XXXA Unspecified fall, initial encounter: Secondary | ICD-10-CM | POA: Diagnosis not present

## 2018-03-29 LAB — BASIC METABOLIC PANEL
BUN/Creatinine Ratio: 25 — ABNORMAL HIGH (ref 10–24)
BUN: 30 mg/dL — ABNORMAL HIGH (ref 8–27)
CO2: 26 mmol/L (ref 20–29)
Calcium: 9.1 mg/dL (ref 8.6–10.2)
Chloride: 99 mmol/L (ref 96–106)
Creatinine, Ser: 1.21 mg/dL (ref 0.76–1.27)
GFR calc Af Amer: 63 mL/min/{1.73_m2} (ref >59)
GFR, EST NON AFRICAN AMERICAN: 55 mL/min/{1.73_m2} — AB (ref >59)
Glucose: 104 mg/dL — ABNORMAL HIGH (ref 65–99)
Potassium: 4.1 mmol/L (ref 3.5–5.2)
SODIUM: 141 mmol/L (ref 134–144)

## 2018-03-29 LAB — PRO B NATRIURETIC PEPTIDE: NT-Pro BNP: 519 pg/mL — ABNORMAL HIGH (ref 0–486)

## 2018-04-10 ENCOUNTER — Telehealth: Payer: Self-pay

## 2018-04-10 ENCOUNTER — Other Ambulatory Visit: Payer: Self-pay

## 2018-04-10 ENCOUNTER — Other Ambulatory Visit (HOSPITAL_COMMUNITY): Payer: Medicare Other

## 2018-04-10 DIAGNOSIS — Z952 Presence of prosthetic heart valve: Secondary | ICD-10-CM

## 2018-04-10 DIAGNOSIS — I350 Nonrheumatic aortic (valve) stenosis: Secondary | ICD-10-CM

## 2018-04-10 NOTE — Telephone Encounter (Signed)
Due to current COVID 19 pandemic, our office is severely reducing in office visits for at least the next 2 weeks, in order to minimize the risk to our patients and healthcare providers.   I called pt, spoke to pt's wife, Lorin Picket, per DPR, and advised her of this information. Pt's wife is agreeable to a virtual visit with pt and Dr. Rexene Alberts tomorrow during his appt time.  Pt's understands that although there may be some limitations with this type of visit, we will take all precautions to reduce any security or privacy concerns.  Pt's understands that this will be treated like an in office visit and we will file with pt's insurance, and there may be a patient responsible charge related to this service.  Pt's email address is [email or web address]. Pt's wife understands that the [employer] webex app must be downloaded and operational before pt's appt tomorrow.

## 2018-04-11 ENCOUNTER — Ambulatory Visit: Payer: Medicare Other | Admitting: Neurology

## 2018-04-11 ENCOUNTER — Other Ambulatory Visit: Payer: Self-pay | Admitting: Physician Assistant

## 2018-04-11 ENCOUNTER — Ambulatory Visit (HOSPITAL_COMMUNITY): Payer: Medicare Other

## 2018-04-11 ENCOUNTER — Other Ambulatory Visit: Payer: Self-pay

## 2018-04-11 ENCOUNTER — Ambulatory Visit (INDEPENDENT_AMBULATORY_CARE_PROVIDER_SITE_OTHER): Payer: Medicare Other | Admitting: Neurology

## 2018-04-11 ENCOUNTER — Ambulatory Visit (INDEPENDENT_AMBULATORY_CARE_PROVIDER_SITE_OTHER): Payer: Medicare Other | Admitting: Physician Assistant

## 2018-04-11 DIAGNOSIS — Z952 Presence of prosthetic heart valve: Secondary | ICD-10-CM

## 2018-04-11 DIAGNOSIS — G4752 REM sleep behavior disorder: Secondary | ICD-10-CM

## 2018-04-11 DIAGNOSIS — I2510 Atherosclerotic heart disease of native coronary artery without angina pectoris: Secondary | ICD-10-CM

## 2018-04-11 DIAGNOSIS — K8689 Other specified diseases of pancreas: Secondary | ICD-10-CM

## 2018-04-11 DIAGNOSIS — G478 Other sleep disorders: Secondary | ICD-10-CM | POA: Diagnosis not present

## 2018-04-11 DIAGNOSIS — G4731 Primary central sleep apnea: Secondary | ICD-10-CM

## 2018-04-11 DIAGNOSIS — G20 Parkinson's disease: Secondary | ICD-10-CM

## 2018-04-11 DIAGNOSIS — G4719 Other hypersomnia: Secondary | ICD-10-CM | POA: Diagnosis not present

## 2018-04-11 DIAGNOSIS — K5909 Other constipation: Secondary | ICD-10-CM

## 2018-04-11 DIAGNOSIS — G4733 Obstructive sleep apnea (adult) (pediatric): Secondary | ICD-10-CM | POA: Diagnosis not present

## 2018-04-11 DIAGNOSIS — F515 Nightmare disorder: Secondary | ICD-10-CM

## 2018-04-11 MED ORDER — FUROSEMIDE 40 MG PO TABS: 40.0000 mg | ORAL_TABLET | Freq: Every day | ORAL | 3 refills | Status: DC

## 2018-04-11 MED ORDER — LOSARTAN POTASSIUM 100 MG PO TABS: 100.0000 mg | ORAL_TABLET | Freq: Every day | ORAL | 3 refills | Status: DC

## 2018-04-11 MED ORDER — POTASSIUM CHLORIDE ER 10 MEQ PO TBCR: 10.0000 meq | EXTENDED_RELEASE_TABLET | Freq: Every day | ORAL | 3 refills | Status: DC

## 2018-04-11 MED ORDER — AMLODIPINE BESYLATE 5 MG PO TABS: 5.0000 mg | ORAL_TABLET | Freq: Every day | ORAL | 3 refills | Status: DC

## 2018-04-11 MED ORDER — METOPROLOL SUCCINATE ER 25 MG PO TB24: 25.0000 mg | ORAL_TABLET | Freq: Every day | ORAL | 3 refills | Status: DC

## 2018-04-11 NOTE — Progress Notes (Deleted)
[facility]   MULTIDISCIPLINARY HEART VALVE TEAM   Evaluation Performed:  Follow-up visit  This visit type was conducted due to national recommendations for restrictions regarding the COVID-19 Pandemic (e.g. social distancing).  This format is felt to be most appropriate for this patient at this time.  All issues noted in this document were discussed and addressed.  No physical exam was performed (except for noted visual exam findings with Telehealth visits).  The patient has consented to conduct a Telehealth visit and understands insurance will be billed.   Date:  04/11/2018   ID:  [name], DOB [birth year], MRN [id number]  Patient Location:  [place] [address]   Provider location:   [address] [place], [place] [address]  PCP:  Celene Squibb, MD  Cardiologist:  Kate Sable, MD / Dr. Burt Knack & Dr. Cyndia Bent (TAVR) Electrophysiologist:  None   Chief Complaint:  1 month s/p TAVR.   History of Present Illness:    [name] is a 85 y.o. male with a history of Parkinson's disease, hypertension, hyperlipidemia, obstructive sleep apnea, arthritis, and severe aortic stenosiss/p TAVR (03/19/18) who presents via audio conferencing for a telehealth visit today.    The patient does not symptoms concerning for COVID-19 infection (fever, chills, cough, or new SHORTNESS OF BREATH).      Prior CV studies:   The following studies were reviewed today:  TAVR OPERATIVE NOTE   Date of Procedure:03/19/2018  Preoperative Diagnosis:Severe Aortic Stenosis   Postoperative Diagnosis:Same   Procedure:   Transcatheter Aortic Valve Replacement - Percutaneous Transfemoral Approach Edwards Sapien 3 THV (size 58mm, model # 9600TFX, serial # [id number])  Co-Surgeons:Bryan Alveria Apley, MD and [name], MD  Anesthesiologist:Charlene Nyoka Cowden, MD   Dala Dock, MD  Pre-operative Echo Findings: ? Severe aortic stenosis ? Normalleft ventricular systolic function  Post-operative Echo Findings: ? Noparavalvular leak ? Normal/unchangedleft ventricular systolic function   _____________  Echo 03/20/2018: IMPRESSIONS 1. The left ventricle has normal systolic function with an ejection fraction of 60-65%. The cavity size was normal. There is moderately increased left ventricular wall thickness. Left ventricular diastolic Doppler parameters are consistent with impaired relaxation Indeterminent filling pressures The E/e' is 8-15. 2. The right ventricle has normal systolic function. The cavity was normal. There is no increase in right ventricular wall thickness. 3. Left atrial size was mildly dilated. 4. The mitral valve is degenerative. There is mild mitral annular calcification present. 5. A 34mm an Wende Crease Sapien bioprosthetic aortic valve (TAVR) valve is present in the aortic position. Procedure Date: 03/19/2018 Normal aortic valve prosthesis. 6. The inferior vena cava was dilated in size with >50% respiratory variability. 7. When compared to the prior study: 02/06/2018: LVEF 60-65%, moderate to severe aortic stenosis.   Past Medical History:  Diagnosis Date  . Arthritis   . Brain mass    right lateral frontotemporal extra-axial mass 7.1 cm with typical features of meningioma 02/28/18 (to follow-up w/ neurosurgeon Dr. Vertell Limber)  . Cancer [facility])    h/o  skin  cancer  . Coronary artery disease    s/p DES to LAD (02/19/18)  . Ex-smoker   . GERD (gastroesophageal reflux disease)   . Hypercholesteremia   . Hypertension   . Lumbar stenosis   . OSA (obstructive sleep apnea)   . Pancreatic duct dilated   . Parkinson's disease ([place])   . S/P TAVR (transcatheter aortic valve replacement)   . Severe aortic stenosis  Past Surgical History:  Procedure Laterality Date  . APPENDECTOMY     . BACK SURGERY  11/2013  . COLONOSCOPY  11/17/2008   Rourk: Sessile polyp in the cecum status post saline-assisted piecemeal polypectomy, resolution clipping and epinephrine injection therapy performed. Shallow sigmoid diverticula.. Tubular adenoma  . COLONOSCOPY  11/19/2008   Fields: Large amount of liquid blood with clots seen throughout the colon. Previous [place] resolution clip remained in place. Purple spot seen in the lateral aspect polypectomy site, 3 resolution clips placed here. 6 mm sessile ascending colon polyp seen and not manipulated.  . COLONOSCOPY  11/25/2008   Rourk: Blood-tinged colonic effluent, suspect recurrent post polypectomy bleeding status post placement of 3 clips on the polypectomy site on top of the 3 clips that already existed, one had fallen off since last procedure. One small polyp distal to the cecum not manipulated  . COLONOSCOPY  12/06/2009   Rourk: Internal hemorrhoids, scattered left-sided diverticula, cecal polyps, one snared and subsequently clipped, 2 adjacent diminutive polyps ablated. tubular adenoma  . COLONOSCOPY N/A 01/21/2015   Dr. Gala Romney: 1.5 cm carpet-type polyp just distal to the ileocecal valve removed piecemeal fashion and ablated. Small polyp in the rectum. Pathology-tubular adenomas  . COLONOSCOPY N/A 09/01/2015   Procedure: COLONOSCOPY;  Surgeon: Daneil Dolin, MD;  [facility];  Service: Endoscopy;  Laterality: N/A;  845  . CORONARY STENT INTERVENTION N/A 02/19/2018   Procedure: CORONARY STENT INTERVENTION;  Surgeon: [name], MD;  Location: [place] [facility];  Service: Cardiovascular;  Laterality: N/A;  . EYE SURGERY     bilateral  cataracts  . INTRAVASCULAR PRESSURE WIRE/FFR STUDY N/A 02/19/2018   Procedure: INTRAVASCULAR PRESSURE WIRE/FFR STUDY;  Surgeon: [name], MD;  Location: [place] [facility];  Service: Cardiovascular;  Laterality: N/A;  . MENISCUS REPAIR     right knee  2000  . POLYPECTOMY  09/01/2015    Procedure: POLYPECTOMY;  Surgeon: Daneil Dolin, MD;  [facility]: [facility];  Service: Endoscopy;;  colon  . RIGHT/LEFT HEART CATH AND CORONARY ANGIOGRAPHY N/A 02/19/2018   Procedure: RIGHT/LEFT HEART CATH AND CORONARY ANGIOGRAPHY;  Surgeon: [name], MD;  Location: [facility];  Service: Cardiovascular;  Laterality: N/A;  . TEE WITHOUT CARDIOVERSION N/A 03/19/2018   Procedure: TRANSESOPHAGEAL ECHOCARDIOGRAM (TEE);  Surgeon: [name], MD;  Location: [facility];  Service: Open Heart Surgery;  Laterality: N/A;  . TONSILLECTOMY Bilateral   . TRANSCATHETER AORTIC VALVE REPLACEMENT, TRANSFEMORAL N/A 03/19/2018   Procedure: TRANSCATHETER AORTIC VALVE REPLACEMENT, TRANSFEMORAL;  Surgeon: [name], MD;  Location: [place] [facility];  Service: Open Heart Surgery;  Laterality: N/A;     No outpatient medications have been marked as taking for the 04/11/18 encounter (Appointment) with Eileen Stanford, PA-C.     Allergies:   Patient has no known allergies.   Social History   Tobacco Use  . Smoking status: Former Smoker    Packs/day: 1.00    Types: Cigarettes, Pipe    Start date: 01/22/1951    Last attempt to quit: 01/21/1989    Years since quitting: 29.2  . Smokeless tobacco: Never Used  Substance Use Topics  . Alcohol use: No    Alcohol/week: 0.0 standard drinks  . Drug use: No     Family Hx: The patient's family history includes Cancer in his brother; Dementia in his mother; Stroke in his brother.  ROS:   Please see the history of present illness.  All other systems reviewed and are negative.   Labs/Other Tests and Data Reviewed:    Recent Labs: 03/18/2018: ALT <5; B Natriuretic Peptide 82.8 03/20/2018: Hemoglobin 13.7; Magnesium 1.9; Platelets 126 03/28/2018: BUN 30; Creatinine, Ser 1.21; NT-Pro BNP 519; Potassium 4.1; Sodium 141   Recent Lipid Panel Lab Results  Component Value Date/Time   CHOL 144 02/20/2018 07:14 AM   TRIG 68 02/20/2018 07:14 AM    HDL 37 (L) 02/20/2018 07:14 AM   CHOLHDL 3.9 02/20/2018 07:14 AM   LDLCALC 93 02/20/2018 07:14 AM    Wt Readings from Last 3 Encounters:  03/28/18 214 lb (97.1 kg)  03/20/18 202 lb 9.6 oz (91.9 kg)  03/18/18 207 lb (93.9 kg)     Exam:    There were no vitals filed for this visit.  Not completed as visit conducted over the phone  ASSESSMENT & PLAN:    Severe AS s/p TAVR:    COVID-19 Education: The signs and symptoms of COVID-19 were discussed with the patient and how to seek care for testing (follow up with PCP or arrange E-visit).  ***The importance of social distancing was discussed today.  Patient Risk:   After full review of this patients clinical status, I feel that they are at least moderate risk at this time.  Time:   Today, I have spent *** minutes with the patient with telehealth technology discussing ***.     Medication Adjustments/Labs and Tests Ordered: Current medicines are reviewed at length with the patient today.  Concerns regarding medicines are outlined above.  Tests Ordered: No orders of the defined types were placed in this encounter.  Medication Changes: No orders of the defined types were placed in this encounter.   Disposition:  {follow up:15908}  Signed, Angelena Form, PA-C  04/11/2018 2:19 PM    [facility] [place], [place], [place]  [address] Phone: [phone]; Fax: [phone]

## 2018-04-11 NOTE — Patient Instructions (Addendum)
Hello [name],   It was so nice to talk to you on the phone today. I am so glad you are doing so well. I just wanted to send you a recap of our discussion.   Please continue taking antibiotics prior to any dental work including cleanings. You can discontinue plavix after 6 months of therapy (around 09/19/2018). Please continue on aspirin 81 mg indefinitely.   Your 1 month echo has been rescheduled to May.   You have a MRI scheduled to further investigate your dilated pancreatic duct which was an incidental finding on pre TAVR CT.  You will be seen back by your primary cardiologist, Dr. Bronson Ing, in 3-4 months.   I will see you back in 1 years time with an echo.   All appointment details are attached to this letter in your "after visit summary."  I have made many medication changes as outlined below.  MEDICATION CHANGES: Because you are having trouble cutting your pills in half I have prescribed the long acting formulation: STOP Lopressor 12.5mg  BID and START Toprol XL 25mg  daily.   STOP taking Hyzaar (losartan 100- HCTZ 25mg  daily) and start  Losartan 100mg  daily Lasix 40mg  daily Kdur 10 mEq daily  Please review the med list attached as these are all the medications you should be taking.  Please call us with any questions or concerns you may have and please stay safe during these uncertain times.  Nell Range [phone]

## 2018-04-11 NOTE — Progress Notes (Signed)
Mr. Aderhold is a very pleasant 85 year old right-handed gentleman with an underlying medical history of hypertension, hyperlipidemia, history of cancer, ex-smoker, CHF, aortic stenosis with recent aortic valve replacement in March 2020, and complex sleep apnea on BiPAP ST, with whom I am conducting a virtual visit today via Webex (see below). We are discussing his left-sided predominant Parkinson's disease as well as his sleep apnea with a primary central component and obstructive sleep apnea, on treatment with BiPAP ST. The patient is accompanied by his wife today and son, [name], and joins over [employer] tablet computer. I last saw him on 08/20/2017, at which time he was not fully compliant with his BiPAP. He felt fairly stable as far as his PD. He had increased stress, his wife had been in and out of the hospital. He was trying to exercise regularly. We mutually agreed to increase his Sinemet to 2 pills alternating with one pill for a total of 9 pills.  Note, he was diagnosed with severe aortic stenosis in the interim and had a left heart catheterization in February 2020. He had aortic valve replacement on 03/19/2018.  Of note, the patient had an interim emergency room visit on 02/28/2018 after a fall.  He had a head CT and cervical spine CT without contrast on 02/28/2018 and I reviewed the results: IMPRESSION: Masslike lentiform isodense extra-axial collection over the right frontotemporal region measuring 7.0 x 3.3 cm in AP and transverse dimension. Couple small more hyperdense nodular foci along the medial aspect of this collection suggesting hemorrhagic foci. There is mild mass effect on the adjacent cortex although a plane of CSF is maintained. Midline shift 6 mm to the left. This appears to be more typical of a subacute to chronic process and may represent a subacute epidural hematoma versus extra-axial mass such as a meningioma. Recommend clinical correlation as MRI with without contrast  may be helpful for further evaluation.   No acute cervical spine injury.   Minimal chronic ischemic microvascular disease.   Mild spondylosis of the cervical spine. Disc disease at the C3-4 level.  He had a subsequent brain MRI with and without contrast on 02/28/2018 and I reviewed the results: IMPRESSION: 1. Right lateral frontotemporal extra-axial mass measuring up to 7.1 cm with typical features of meningioma. Differential of metastasis or hemangiopericytoma are less likely. There is a irregular peripheral multi cystic component that is in contact with the surface of the brain, however, there is no appreciable invasion or edema of the adjacent brain. 2. Mass effect results in displacement of the adjacent brain, partial effacement of right lateral ventricle, and 7 mm right-to-left midline shift. 3. Background of mild chronic microvascular ischemic changes and volume loss of the brain.  I reviewed the emergency room records from 02/28/2018. He was advised to follow-up with Dr. Vertell Limber in neurosurgery as an outpatient.  Today, 04/11/2018: Please see below for WebEx virtual visit.   The patient's allergies, current medications, family history, past medical history, past social history, past surgical history and problem list were reviewed and updated as appropriate.    Previously (copied from previous notes for reference):    I saw him on 02/19/2017, at which time he was not fully compliant with BiPAP. Overall, his Parkinson symptoms have progressed. His wife had been hospitalized. [name] was in town. I suggested we increase his Sinemet to 1-1/2 pills alternating with one pill for a total of 7-1/2 pills daily from previously 6 pills daily. He was encouraged to continue with  his BiPAP as best as possible. We talked about the importance of hydration and constipation management.   I reviewed his BiPAP compliance data from 07/21/2017 through 08/19/2017 which is a total  of 30 days, during which time he used his machine every night with percent used days greater than 4 hours at 40%, indicating suboptimal compliance with an average usage of 3 hours and 48 minutes, residual AHI at goal at 1.8 per hour, leak acceptable with the 95th percentile at 13.8 L/m on a pressure of 13/9 cm with a backup rate of 10/m.   I saw him on 08/17/2016, at which time he was suboptimal with his BiPAP compliance. He felt that his tremor was worse. He had no recent falls, constipation was also under control. He was not always exercising on a regular basis. His wife was concerned about his daytime somnolence. His wife felt that his driving was still okay, he limited his driving to daylight driving in familiar routes. I asked him to continue with Sinemet 1 pill 6 times a day and Mirapex 0.5 mg 3 times a day. I asked him to monitor his driving and to be consistent with his BiPAP usage.   I reviewed his BiPAP ST compliance data from 01/20/2017 through 02/24/2017, during which time he used his BiPAP machine every night but percent used days greater than 4 hours was only 17%, indicating suboptimal compliance with an average usage of 3 hours and 18 minutes. Residual AHI at goal at 2.1 per hour, leak acceptable with the 95th percentile at 17.9 L/m on a pressure of 13/9 with a backup rate of 10.    I saw him on 12/28/15, and which time he was doing overall fairly well. He was adequate with his BiPAP compliance. He had noted an increase in his tremor on the left side and also increase in stiffness. No recent falls for reported. His primary care reduced his amlodipine, he had more drooling. He had more nasal discharge, usually clear and very drippy at times. I asked him to increase his Sinemet to 1 pill 6 times a day. I suggested he continue with Mirapex at the same dose and will monitor his leg swelling. He was reminded to increase his water intake.   I reviewed his BiPAP ST compliance data from 07/18/2016  through 08/16/2016, which is a total of 30 days, during which time he used his BiPAP 29 days but percent used days greater than 4 hours was only 47%, indicating suboptimal compliance, average AHI 2 per hour, leak acceptable with the 95th percentile at 14.3 L/m, average usage of 4 hours. Pressure of 13/9 cm with a set rate of 10.    I saw him on 06/28/2015, at which time he reported doing okay, tremor perhaps worse. He noticed no significant difference after we increase the Sinemet. Constipation was under control with MiraLAX. He was not exercising very much but does enjoy working in the yard. He had no recent falls. He was not using a cane or walker. He was compliant with his BiPAP ST. I suggested we keep his medications the same but monitor his leg swelling since he was on Mirapex.    I reviewed his BiPAP compliance data from 11/28/2015 through 12/27/2015, which is a total of 30 days, during which time he used his machine every night with percent used days greater than 4 hours at 73%, indicating adequate compliance with an average usage of only 4 hours and 21 minutes. Residual AHI 2 per hour,  leak on the acceptable side with the 95th percentile at 13.4 L/m on a pressure of 13/9 with a rate of 10.   12/28/2014, at which time he reported doing fairly well, no recent falls, tremor somewhat worse and dexterity worse. He had noted some trouble with his turns at times. He was on Sinemet 4 times a day, usually at 6 AM, 10 AM, 2 PM and 8 PM. He had constipation and was on a stool softener and prunes. He was to have a colonoscopy on 01/04/2015.    I reviewed his BiPAP ST compliance data from 05/29/2015 through 06/27/2015, which is a total of 30 days during which time he used his machine every night with percent used days greater than 4 hours at 97%, indicating excellent compliance with an average usage of 5 hours and 16 minutes, residual AHI 1.5 per hour, leak acceptable with the 95th percentile at 12.4 L/m on a  pressure of 13/9 cm with a rate of 10.   I saw him on 07/09/2014, at whicht time he reported feeling stable for the most part. He had not experienced any drastic changes in his symptoms. He reported no falls. He had no significant low back pain and no longer was taking any narcotics. Unfortunately, his wife was involved in a car accident and sustained broken ribs and had to be in the ICU for some time. He was using his BiPAP machine regularly. He reported one skip night because of a stomach bug. Overall, he felt he was sleeping well. He felt that the increase in Sinemet helped his trembling. He was tolerating his medication. He had no new major mood or memory issues.    I reviewed his BiPAP compliance data from 11/23/2014 through 12/22/2014 which is a total of 30 days during which time he used his machine every night with percent used days greater than 4 hours at 93%, indicating excellent compliance with an average usage of 4 hours and 54 minutes, residual AHI low at 1.7 per hour, leaked low with the 95th percentile at 7.4 L/m on a pressure of 13/9 cm with a backup rate of 10.    I saw him on 03/03/2014, at which time he reported experiencing thick mucus first thing in the morning. His tremor was worse per wife. He received new supplies recently. He has some thick mucus first thing in the morning. His tremor has become worse per wife. His back surgery in November 2015 had relieved much of his pain. He was no longer on pain medication. He was taking stool softeners for his constipation. I suggested he continue with Mirapex 3 times a day but I did ask him to increase Sinemet to 4 times a day.   I reviewed his BiPAP compliance data from 06/07/2014 through 07/06/2014 which is a total of 30 days during which time he used his machine 29 days with percent used days greater than 4 hours at 87%, indicating very good compliance with an average usage of 5 hours and 2 minutes, residual AHI low at 1.6 per hour, leak low  with the 95th percentile at 7.6 L/m on a pressure of 13/9 with a rate of 10.   I saw him on 12/02/2013, at which time he was compliant with BiPAP ST. He had spine surgery under Dr. Cyndy Freeze on 12/04/2013 which went well. He is in physical therapy. He had an L spine MRI on 11/06/13: Degenerative lumbar spondylosis with multilevel disc disease and facet disease. There is bilateral lateral recess  and bilateral foraminal stenosis at L2-3 and L3-4. The most significant level however is L4-5 with there is severe spinal, bilateral lateral recess and foraminal stenosis. We talked about his fluid intake. He was still not consistent with his dose timings for his Sinemet and Mirapex.   I reviewed his compliance data from 01/27/2014 through 02/25/2014 which is a total of 30 days during which time he used his machine every night with percent used days greater than 4 hours of 87%, indicating very good compliance with an average usage of 5 hours and 6 minutes, residual AHI of 3.5 per hour and leak acceptable with the 95th percentile at 15 L/m.   I saw him on 05/27/2013, at which time he reported being compliant with his BiPAP, averaging 4-5 hours each night. He was still taking his Sinemet with his meals on most days. He felt his tremor was a little worse. His wife felt that he was otherwise stable. He denied any new cognitive issues, depression, hallucinations, anxiety, delusions. He was drinking 3 cups of coffee in the morning and not enough water. I did not increase his medication but asked him to take his Sinemet away from his mealtimes. I considered Linzess for chronic constipation but asked him to increase his water intake and watch constipation symptoms before we use medications.   I reviewed his compliance data from 10/31/2013 through 11/29/2013 which is a total of 30 days during which time he used his machine every night except for 1 night. Percent used days greater than 4 hours was 90% indicating excellent  compliance. Residual AHI at 2.5 per hour, leaked low. Pressure at 13/9 with a rate of 10.   I saw him on 11/27/2012, at which time I did not change his medication regimen with the exception of the timing for his Sinemet and Mirapex: I advised him to take it at 7 AM, 11 AM and 4 PM. I also asked him to continue using his BiPAP regularly and take it with him on any vacation trips. He was congratulated on his compliance.   I saw him on 07/26/2012 after had his sleep study. He has been compliant on BiPAP therapy. I had increased his Sinemet to one pill 3 times a day. He was taking it with his meals and did not note any significant improvement in his symptoms, but, then again, he was taking it right after his meals. He was asking whether he should take his BiPAP machine with him to his planned trip to [place].     I first met him on 02/19/2012 after his baseline sleep study. His total AHI was 39.5 per hour based primarily on central apneas. His obstructive AHI was around 15 per hour. His oxyhemoglobin desaturation nadir was 85% and he spent 3 hours and 31 minutes below the saturation of 90% for the night. I asked him to come back for a CPAP titration study, possibly BiPAP but he wanted to hold off until his appointment in April as he was going to be out of town and he also wanted to bring his wife for discussion.   I then saw him back on 04/17/2012 and again went over his test results with him and his wife. He had been doing well from the PD standpoint. He agreed to come back for another sleep study with full night titration. His sleep titration study was on 05/14/2012 and I went over his test results with him and his wife in detail during our visit in July. Sleep  efficiency was reduced at 71.5% with a latency to sleep of 2 minutes. Wake after sleep onset was highly elevated at 124 minutes with mild to moderate sleep fragmentation noted. He had increased percentage of REM sleep at 27.2%. He had a normal REM  latency. There were no significant aortic leg movements. He was started on CPAP at a pressure of 5 cm and titrated up to 9 cm of water pressure but he had significant central apneas and therefore changed to BiPAP at 11/7 and then switch to ST mode for ongoing central events. His final pressure was 13/9 with a backup rate of 10 on which she had a residual AHI of 0 per hour and supine REM sleep achieved. Oxyhemoglobin desaturation nadir on the final pressure was 90%.   He was placed on BiPAP ST at 13/9 cm with a backup rate of 10. I reviewed his compliance from 06/26/2012 through 07/25/2012 (29 days), during which time he used it every day. His average usage was 5 hours and 6 minutes and percent used days greater than 4 hours was 96% indicating excellent compliance. His residual AHI was around 6 indicating fairly reasonable pressure settings. He reported tolerating the treatment and he changed from a FFM to a nasal mask. He denied depression, memory loss, lightheadedness, or hallucinations. I also reviewed more recent compliance data from 09/29/2012 through 10/28/2012 (30 days), during which time he used his machine every day. His average usage was 5 hours and 23 minutes, his percent used days greater than 4 hours was 29 days, which is 97%, indicating excellent compliance. His residual AHI was 2.6 per hour indicating an appropriate treatment setting of 13/9 cm with a backup rate of 10 per minute.   Virtual Visit via Video Note on 04/11/2018:    I connected with [name] on 04/11/18 at  8:30 AM EDT by a video enabled telemedicine application and verified that I am speaking with the correct person using two identifiers.   I discussed the limitations of evaluation and management by telemedicine and the availability of in person appointments. The patient expressed understanding and agreed to proceed.  History of Present Illness:  He reports doing fairly well as far as cardiac, does have shortness of  breath approximately after 17 steps. He denies any chest pain. He does uses BiPAP at night, unfortunately, and up-to-date tablet was not available, we need his machine for this. He reports averaging about 4 hours each night, starts using it around midnight, his wife reports that he is very sleepy during the day, to the point that she wonders if he has narcolepsy. He falls asleep whenever he is sedentary more than 10 minutes. He tends to start talking in his sleep and gets confused, he has no obvious visual or auditory hallucinations otherwise during wakefulness. He does have dream enactments including moving and talking out in his sleep but she does not notice it so much at night as opposed to during the day. As far as bowel movements, he is averaging a bowel movement every other day, takes MiraLAX as needed, perhaps every 4 or 5 days. He may need to drink more water, by a self estimation he drinks about 4-5 cups per day.his appetite is good. He had a follow-up with neurosurgery, Dr. Vertell Limber recently and was told that they would repeat his brain MRI in about 3 months, which should be in May. He also is scheduled for a follow-up appointment. He continues to take Sinemet 2 pills alternating with  one pill on a scheduled basis. He takes Mirapex 0.5 mg 3 times a day. From the motor standpoint he feels stable.   Observations/Objective:  His last vital signs on file are from 03/28/2018, blood pressure was 138/64 with a pulse of 69, weight 214. By self-report, today's weight per patient was 200.4 pounds.  On examination, he appears more thinner and frail but in no acute distress, he is conversant. He has facial masking, he has moderate hypophonia, perhaps slight dysarthria. Motor exam shows at least moderate difficulty with fine motor skills in both upper extremities. He is able to do finger to nose quite well. No intention tremor.   Assessment and Plan:  In summary, ANTWAIN CALIENDO is a very pleasant 85 year old  male with an underlying medical history of hypertension, hyperlipidemia, history of cancer, ex-smoker, and complex sleep apnea on BiPAP ST, recent aortic valve replacement surgery, new diagnosis of brain tumor, suspected meningioma for which he has seen neurosurgery, who presents for virtual followup via Webex for his left-sided predominant Parkinson's disease as well as hiscomplex sleep apnea, for which he is on BiPAP ST. He is encouraged to continue to use his BiPAP regularly. As far as his Parkinson's disease, he was diagnosed about 10 years ago. He has had some intermittent issues with constipation. He is having more sleep difficulty, interrupted sleep, has nocturia about 3 times per average night, dream enactments and sleep talking and significant daytime somnolence. He has a history of back pain and is status post back surgery on 12/04/2013 with good success. He is scheduled for follow-up with neurosurgery for his brain lesion and will have a follow-up MRI brain as well. He has been able to tolerate Sinemet which is currently 2 pills alternating with one pill for a total of 9 pills daily and I suggest we continue with this.  As far as his sleepiness and difficulty with sleep at night with evidence by history of REM behavior disorder, I suggested he try melatonin 3-6 mg at night, we mutually agreed to try it at 9 PM when he takes his last dose of Sinemet. He is encouraged to try to be in bed around 11 PM, currently estimates he is in bed for his BiPAP treatment around midnight. I encouraged him to extend his use with his BiPAP. Furthermore, would like to reduce and perhaps eventually taper off completely his Mirapex as it can increase his sleepiness. To that end, I have advised him to reduce it to 2 pills a day, 1 in the morning and 1 in the evening and skip the midday dose. He is encouraged to drink plenty of water, and be proactive about constipation issues. I suggested a 3 month follow-up in the office if  possible . He did not need any refills today. I answered all their questions today and the patient and his wife and son were in agreement.   Follow Up Instructions:  3 months.  I discussed the assessment and treatment plan with the patient. The patient was provided an opportunity to ask questions and all were answered. The patient agreed with the plan and demonstrated an understanding of the instructions.   The patient was advised to call back or seek an in-person evaluation if the symptoms worsen or if the condition fails to improve as anticipated.  I provided 33 minutes of non-face-to-face time during this encounter.   Star Age, MD
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
## 2018-04-11 NOTE — Patient Instructions (Signed)
Given over the phone, patient and son verbalized understanding and agreement:  1. Reduce Mirapex to 0.5 mg twice a day, down from 3 times a day. 2. Extend BiPAP use and started 11 PM. 3. Try melatonin at night, 3 mg or 6 mg at 9 PM. 4. Continue with Sinemet at the current dose. 5. Follow-up with neurosurgery as scheduled and hopefully get a repeat brain MRI around mid-May. 6. Follow-up with me in 3 months.

## 2018-04-22 ENCOUNTER — Encounter: Payer: Self-pay | Admitting: Surgery

## 2018-05-08 DIAGNOSIS — R7301 Impaired fasting glucose: Secondary | ICD-10-CM | POA: Diagnosis not present

## 2018-05-08 DIAGNOSIS — I10 Essential (primary) hypertension: Secondary | ICD-10-CM | POA: Diagnosis not present

## 2018-05-08 DIAGNOSIS — E782 Mixed hyperlipidemia: Secondary | ICD-10-CM | POA: Diagnosis not present

## 2018-05-13 ENCOUNTER — Other Ambulatory Visit (HOSPITAL_COMMUNITY): Payer: Medicare Other

## 2018-05-14 DIAGNOSIS — K219 Gastro-esophageal reflux disease without esophagitis: Secondary | ICD-10-CM | POA: Diagnosis not present

## 2018-05-14 DIAGNOSIS — Z952 Presence of prosthetic heart valve: Secondary | ICD-10-CM | POA: Diagnosis not present

## 2018-05-14 DIAGNOSIS — I10 Essential (primary) hypertension: Secondary | ICD-10-CM | POA: Diagnosis not present

## 2018-05-14 DIAGNOSIS — R7301 Impaired fasting glucose: Secondary | ICD-10-CM | POA: Diagnosis not present

## 2018-05-14 DIAGNOSIS — R944 Abnormal results of kidney function studies: Secondary | ICD-10-CM | POA: Diagnosis not present

## 2018-05-14 DIAGNOSIS — I714 Abdominal aortic aneurysm, without rupture: Secondary | ICD-10-CM | POA: Diagnosis not present

## 2018-05-14 DIAGNOSIS — Q453 Other congenital malformations of pancreas and pancreatic duct: Secondary | ICD-10-CM | POA: Diagnosis not present

## 2018-05-14 DIAGNOSIS — D696 Thrombocytopenia, unspecified: Secondary | ICD-10-CM | POA: Diagnosis not present

## 2018-05-14 DIAGNOSIS — E782 Mixed hyperlipidemia: Secondary | ICD-10-CM | POA: Diagnosis not present

## 2018-05-14 DIAGNOSIS — G4733 Obstructive sleep apnea (adult) (pediatric): Secondary | ICD-10-CM | POA: Diagnosis not present

## 2018-05-14 DIAGNOSIS — G20 Parkinson's disease: Secondary | ICD-10-CM | POA: Diagnosis not present

## 2018-05-14 DIAGNOSIS — I350 Nonrheumatic aortic (valve) stenosis: Secondary | ICD-10-CM | POA: Diagnosis not present

## 2018-05-21 ENCOUNTER — Ambulatory Visit (HOSPITAL_COMMUNITY): Payer: Medicare Other | Attending: Cardiology

## 2018-05-28 ENCOUNTER — Ambulatory Visit (HOSPITAL_COMMUNITY)
Admission: RE | Admit: 2018-05-28 | Discharge: 2018-05-28 | Disposition: A | Discharge status: Home / Self Care | Payer: Medicare Other | Source: Ambulatory Visit | Attending: Internal Medicine | Admitting: Internal Medicine

## 2018-05-28 ENCOUNTER — Other Ambulatory Visit: Payer: Self-pay

## 2018-05-28 DIAGNOSIS — Z952 Presence of prosthetic heart valve: Secondary | ICD-10-CM | POA: Diagnosis not present

## 2018-05-28 NOTE — Progress Notes (Signed)
*  PRELIMINARY RESULTS* Echocardiogram 2D Echocardiogram has been performed.  [name] 05/28/2018, 2:31 PM

## 2018-05-30 DIAGNOSIS — L821 Other seborrheic keratosis: Secondary | ICD-10-CM | POA: Diagnosis not present

## 2018-05-30 DIAGNOSIS — D1801 Hemangioma of skin and subcutaneous tissue: Secondary | ICD-10-CM | POA: Diagnosis not present

## 2018-05-30 DIAGNOSIS — D692 Other nonthrombocytopenic purpura: Secondary | ICD-10-CM | POA: Diagnosis not present

## 2018-05-30 DIAGNOSIS — Z85828 Personal history of other malignant neoplasm of skin: Secondary | ICD-10-CM | POA: Diagnosis not present

## 2018-05-30 DIAGNOSIS — L570 Actinic keratosis: Secondary | ICD-10-CM | POA: Diagnosis not present

## 2018-05-30 DIAGNOSIS — D485 Neoplasm of uncertain behavior of skin: Secondary | ICD-10-CM | POA: Diagnosis not present

## 2018-05-30 DIAGNOSIS — D481 Neoplasm of uncertain behavior of connective and other soft tissue: Secondary | ICD-10-CM | POA: Diagnosis not present

## 2018-05-30 DIAGNOSIS — L812 Freckles: Secondary | ICD-10-CM | POA: Diagnosis not present

## 2018-06-03 ENCOUNTER — Ambulatory Visit
Admission: RE | Admit: 2018-06-03 | Discharge: 2018-06-03 | Disposition: A | Discharge status: Home / Self Care | Payer: Medicare Other | Source: Ambulatory Visit | Attending: Physician Assistant | Admitting: Physician Assistant

## 2018-06-03 ENCOUNTER — Other Ambulatory Visit: Payer: Self-pay

## 2018-06-03 DIAGNOSIS — K8689 Other specified diseases of pancreas: Secondary | ICD-10-CM

## 2018-06-03 MED ORDER — GADOBENATE DIMEGLUMINE 529 MG/ML IV SOLN
19.0000 mL | Freq: Once | INTRAVENOUS | Status: AC | PRN
  Administered 2018-06-03: 14:00:00 19 mL via INTRAVENOUS

## 2018-06-04 ENCOUNTER — Other Ambulatory Visit: Payer: Self-pay | Admitting: Physician Assistant

## 2018-06-04 DIAGNOSIS — I714 Abdominal aortic aneurysm, without rupture, unspecified: Secondary | ICD-10-CM

## 2018-06-04 DIAGNOSIS — K8689 Other specified diseases of pancreas: Secondary | ICD-10-CM

## 2018-06-04 DIAGNOSIS — R932 Abnormal findings on diagnostic imaging of liver and biliary tract: Secondary | ICD-10-CM

## 2018-06-04 NOTE — Progress Notes (Signed)
MRI abdomen set up for 6 months, also vascular surgery referral made to follow infrarenal AAA

## 2018-06-12 DIAGNOSIS — Z85828 Personal history of other malignant neoplasm of skin: Secondary | ICD-10-CM | POA: Diagnosis not present

## 2018-06-12 DIAGNOSIS — C4442 Squamous cell carcinoma of skin of scalp and neck: Secondary | ICD-10-CM | POA: Diagnosis not present

## 2018-06-12 DIAGNOSIS — D234 Other benign neoplasm of skin of scalp and neck: Secondary | ICD-10-CM | POA: Diagnosis not present

## 2018-06-26 ENCOUNTER — Other Ambulatory Visit: Payer: Self-pay | Admitting: Physician Assistant

## 2018-06-26 ENCOUNTER — Encounter: Payer: Self-pay | Admitting: Vascular Surgery

## 2018-06-26 ENCOUNTER — Other Ambulatory Visit: Payer: Self-pay

## 2018-06-26 ENCOUNTER — Other Ambulatory Visit: Payer: Self-pay | Admitting: Neurosurgery

## 2018-06-26 ENCOUNTER — Ambulatory Visit (INDEPENDENT_AMBULATORY_CARE_PROVIDER_SITE_OTHER): Payer: Medicare Other | Admitting: Vascular Surgery

## 2018-06-26 ENCOUNTER — Other Ambulatory Visit (HOSPITAL_COMMUNITY): Payer: Self-pay | Admitting: Neurosurgery

## 2018-06-26 VITALS — BP 125/74 | HR 77 | Temp 97.6°F | Resp 20 | Ht 70.0 in | Wt 194.6 lb

## 2018-06-26 DIAGNOSIS — D329 Benign neoplasm of meninges, unspecified: Secondary | ICD-10-CM

## 2018-06-26 DIAGNOSIS — I714 Abdominal aortic aneurysm, without rupture, unspecified: Secondary | ICD-10-CM

## 2018-06-26 DIAGNOSIS — I723 Aneurysm of iliac artery: Secondary | ICD-10-CM | POA: Diagnosis not present

## 2018-06-26 DIAGNOSIS — I2510 Atherosclerotic heart disease of native coronary artery without angina pectoris: Secondary | ICD-10-CM | POA: Diagnosis not present
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
## 2018-06-28 ENCOUNTER — Ambulatory Visit (HOSPITAL_COMMUNITY)
Admission: RE | Admit: 2018-06-28 | Discharge: 2018-06-28 | Disposition: A | Discharge status: Home / Self Care | Payer: Medicare Other | Source: Ambulatory Visit | Attending: Neurosurgery | Admitting: Neurosurgery

## 2018-06-28 ENCOUNTER — Other Ambulatory Visit: Payer: Self-pay

## 2018-06-28 DIAGNOSIS — D329 Benign neoplasm of meninges, unspecified: Secondary | ICD-10-CM | POA: Diagnosis not present

## 2018-06-28 DIAGNOSIS — G319 Degenerative disease of nervous system, unspecified: Secondary | ICD-10-CM | POA: Diagnosis not present

## 2018-06-28 DIAGNOSIS — R9082 White matter disease, unspecified: Secondary | ICD-10-CM | POA: Diagnosis not present

## 2018-06-28 LAB — POCT I-STAT CREATININE: Creatinine, Ser: 1.1 mg/dL (ref 0.61–1.24)

## 2018-06-28 MED ORDER — GADOBUTROL 1 MMOL/ML IV SOLN
9.0000 mL | Freq: Once | INTRAVENOUS | Status: AC | PRN
  Administered 2018-06-28: 09:00:00 9 mL via INTRAVENOUS

## 2018-07-01 DIAGNOSIS — D329 Benign neoplasm of meninges, unspecified: Secondary | ICD-10-CM | POA: Diagnosis not present

## 2018-07-04 ENCOUNTER — Telehealth: Payer: Self-pay

## 2018-07-04 DIAGNOSIS — G4733 Obstructive sleep apnea (adult) (pediatric): Secondary | ICD-10-CM

## 2018-07-04 DIAGNOSIS — G4731 Primary central sleep apnea: Secondary | ICD-10-CM

## 2018-07-04 NOTE — Telephone Encounter (Signed)
Received request from [employer] requesting an order for a new bipap for pt. Pt's bipap was set up on 06/26/2012. Pt's current bipap is set at 13/9.

## 2018-07-04 NOTE — Telephone Encounter (Signed)
New BiPAP ST order placed. Pt will need FU appt after starting new equipm please arrange for 60-90 day FU appt, may see the NP.

## 2018-07-08 DIAGNOSIS — T25221A Burn of second degree of right foot, initial encounter: Secondary | ICD-10-CM | POA: Diagnosis not present

## 2018-07-08 NOTE — Telephone Encounter (Signed)
I called pt. Advised him that his new bipap ST order will be sent to [facility] and he will need a 3 mo follow up (31-90 days from starting new bipap. Pt also wants to keep his appt next week. Pt is a high COVID score but still wants to come in. [facility] COVID policy and procedures discussed with him. Pt verbalized understanding.

## 2018-07-11 DIAGNOSIS — T25221D Burn of second degree of right foot, subsequent encounter: Secondary | ICD-10-CM | POA: Diagnosis not present

## 2018-07-11 NOTE — Telephone Encounter (Signed)
I called pt, reminded him to bring his bipap to his appt on Monday. Pt verbalized understanding.

## 2018-07-15 ENCOUNTER — Encounter: Payer: Self-pay | Admitting: Neurology

## 2018-07-15 ENCOUNTER — Ambulatory Visit (INDEPENDENT_AMBULATORY_CARE_PROVIDER_SITE_OTHER): Payer: Medicare Other | Admitting: Neurology

## 2018-07-15 ENCOUNTER — Other Ambulatory Visit: Payer: Self-pay

## 2018-07-15 ENCOUNTER — Encounter: Payer: Self-pay | Admitting: Thoracic Surgery (Cardiothoracic Vascular Surgery)

## 2018-07-15 VITALS — BP 147/78 | HR 77 | Ht 71.0 in | Wt 197.0 lb

## 2018-07-15 DIAGNOSIS — G20 Parkinson's disease: Secondary | ICD-10-CM

## 2018-07-15 DIAGNOSIS — I2510 Atherosclerotic heart disease of native coronary artery without angina pectoris: Secondary | ICD-10-CM

## 2018-07-15 DIAGNOSIS — G4719 Other hypersomnia: Secondary | ICD-10-CM

## 2018-07-15 DIAGNOSIS — G4752 REM sleep behavior disorder: Secondary | ICD-10-CM

## 2018-07-15 DIAGNOSIS — G4733 Obstructive sleep apnea (adult) (pediatric): Secondary | ICD-10-CM | POA: Diagnosis not present

## 2018-07-15 DIAGNOSIS — G4731 Primary central sleep apnea: Secondary | ICD-10-CM

## 2018-07-15 DIAGNOSIS — R2689 Other abnormalities of gait and mobility: Secondary | ICD-10-CM | POA: Diagnosis not present

## 2018-07-15 DIAGNOSIS — K5909 Other constipation: Secondary | ICD-10-CM | POA: Diagnosis not present

## 2018-07-15 DIAGNOSIS — F515 Nightmare disorder: Secondary | ICD-10-CM | POA: Diagnosis not present

## 2018-07-15 NOTE — Progress Notes (Signed)
Subjective:    Patient ID: [name] is a 85 y.o. male.  HPI     Interim history:   [name] is a very pleasant 85 year old right-handed gentleman with an underlying medical history of hypertension, hyperlipidemia, history of cancer, ex-smoker, CHF, aortic stenosis with recent aortic valve replacement in March 2020, and complex sleep apnea on BiPAP ST, with who presents for follow-up consultation of his left-sided predominant Parkinson's disease as well as complex sleep apnea, on treatment with BiPAP ST.  The patient is accompanied by his daughter today.  I last saw him for a virtual video visit on 04/11/2018, at which time we mutually agreed to reduce his Mirapex due to sleepiness reported.  He was advised to skip the midday dose and we would monitor his symptoms, perhaps further reduce or even eliminate his Mirapex altogether.  He was advised to take melatonin at night in the evening before bedtime to help him sleep.  He was advised to stay compliant with BiPAP ST.  He has benefited from the BiPAP ST and was eligible for a new machine soon.  He was supposed to see Dr. Vertell Limber in neurosurgery for a brain lesion.  He had an interim brain MRI with and without contrast as ordered by neurosurgery on 06/28/2018 which showed a stable enhancing extraaxial mass in keeping with meningioma: IMPRESSION: 1. Stable appearance of extra-axial tumor with dural enhancement and typical characteristics of meningioma spanning from the anterior cranial fossa to the middle cranial fossa. 2. Stable mass effect. 3. Atrophy and white matter disease unchanged. 4. No acute intracranial abnormality or significant interval change.    Today, 07/15/2018:    He reports that his BiPAP machine broke.  His last compliance data from 05/28/2018 through 06/26/2018 indicates that he used his machine 24 days with percent used days greater than 4 hours at 23%, residual AHI 8.1/h, average usage of 3 hours and 33 minutes on a  pressure of 13/9 with a backup rate of 10/min.  His leak is high with the 95th percentile at 41.5 L/min.  He has been on Mirapex twice daily since we had our video visit in March.  He has ongoing issues with lower extremity swelling.  His constipation is off and on, he takes MiraLAX as needed.  Sometimes he feels off balance first thing in the morning, thankfully has had no falls.  His daughter reports that when he was in the yard working the other day he spilled some gasoline on his foot and broke out in blisters.  These are healing and he is in follow-up with his primary care physician for this.  He has had more difficulty with ADLs but does not accept help very well per daughter's report.  He reports that he hydrates okay with water, estimates that he drinks up to 6 cups of water per day.  He does not typically use a walker or cane, has both available.  He has occasional difficulty with urinary incontinence.  The patient's allergies, current medications, family history, past medical history, past social history, past surgical history and problem list were reviewed and updated as appropriate.    Previously (copied from previous notes for reference):    m I am conducting a virtual visit today via Webex (see below). We are discussing his left-sided predominant Parkinson's disease as well as his sleep apnea with a primary central component and obstructive sleep apnea, on treatment with BiPAP ST. The patient is accompanied by his wife today and son, [name],  and joins over [employer] tablet computer. I last saw him on 08/20/2017, at which time he was not fully compliant with his BiPAP. He felt fairly stable as far as his PD. He had increased stress, his wife had been in and out of the hospital. He was trying to exercise regularly. We mutually agreed to increase his Sinemet to 2 pills alternating with one pill for a total of 9 pills.   Note, he was diagnosed with severe aortic stenosis in the interim and had a  left heart catheterization in February 2020. He had aortic valve replacement on 03/19/2018.   Of note, the patient had an interim emergency room visit on 02/28/2018 after a fall.   He had a head CT and cervical spine CT without contrast on 02/28/2018 and I reviewed the results: IMPRESSION: Masslike lentiform isodense extra-axial collection over the right frontotemporal region measuring 7.0 x 3.3 cm in AP and transverse dimension. Couple small more hyperdense nodular foci along the medial aspect of this collection suggesting hemorrhagic foci. There is mild mass effect on the adjacent cortex although a plane of CSF is maintained. Midline shift 6 mm to the left. This appears to be more typical of a subacute to chronic process and may represent a subacute epidural hematoma versus extra-axial mass such as a meningioma. Recommend clinical correlation as MRI with without contrast may be helpful for further evaluation.   No acute cervical spine injury.   Minimal chronic ischemic microvascular disease.   Mild spondylosis of the cervical spine. Disc disease at the C3-4 level.   He had a subsequent brain MRI with and without contrast on 02/28/2018 and I reviewed the results: IMPRESSION: 1. Right lateral frontotemporal extra-axial mass measuring up to 7.1 cm with typical features of meningioma. Differential of metastasis or hemangiopericytoma are less likely. There is a irregular peripheral multi cystic component that is in contact with the surface of the brain, however, there is no appreciable invasion or edema of the adjacent brain. 2. Mass effect results in displacement of the adjacent brain, partial effacement of right lateral ventricle, and 7 mm right-to-left midline shift. 3. Background of mild chronic microvascular ischemic changes and volume loss of the brain.   I reviewed the emergency room records from 02/28/2018. He was advised to follow-up with Dr. Vertell Limber in neurosurgery as an  outpatient.   I saw him on 02/19/2017, at which time he was not fully compliant with BiPAP. Overall, his Parkinson symptoms have progressed. His wife had been hospitalized. [name] was in town. I suggested we increase his Sinemet to 1-1/2 pills alternating with one pill for a total of 7-1/2 pills daily from previously 6 pills daily. He was encouraged to continue with his BiPAP as best as possible. We talked about the importance of hydration and constipation management.   I reviewed his BiPAP compliance data from 07/21/2017 through 08/19/2017 which is a total of 30 days, during which time he used his machine every night with percent used days greater than 4 hours at 40%, indicating suboptimal compliance with an average usage of 3 hours and 48 minutes, residual AHI at goal at 1.8 per hour, leak acceptable with the 95th percentile at 13.8 L/m on a pressure of 13/9 cm with a backup rate of 10/m.    I saw him on 08/17/2016, at which time he was suboptimal with his BiPAP compliance. He felt that his tremor was worse. He had no recent falls, constipation was also under control. He was  not always exercising on a regular basis. His wife was concerned about his daytime somnolence. His wife felt that his driving was still okay, he limited his driving to daylight driving in familiar routes. I asked him to continue with Sinemet 1 pill 6 times a day and Mirapex 0.5 mg 3 times a day. I asked him to monitor his driving and to be consistent with his BiPAP usage.   I reviewed his BiPAP ST compliance data from 01/20/2017 through 02/24/2017, during which time he used his BiPAP machine every night but percent used days greater than 4 hours was only 17%, indicating suboptimal compliance with an average usage of 3 hours and 18 minutes. Residual AHI at goal at 2.1 per hour, leak acceptable with the 95th percentile at 17.9 L/m on a pressure of 13/9 with a backup rate of 10.    I saw him on 12/28/15, and which  time he was doing overall fairly well. He was adequate with his BiPAP compliance. He had noted an increase in his tremor on the left side and also increase in stiffness. No recent falls for reported. His primary care reduced his amlodipine, he had more drooling. He had more nasal discharge, usually clear and very drippy at times. I asked him to increase his Sinemet to 1 pill 6 times a day. I suggested he continue with Mirapex at the same dose and will monitor his leg swelling. He was reminded to increase his water intake.   I reviewed his BiPAP ST compliance data from 07/18/2016 through 08/16/2016, which is a total of 30 days, during which time he used his BiPAP 29 days but percent used days greater than 4 hours was only 47%, indicating suboptimal compliance, average AHI 2 per hour, leak acceptable with the 95th percentile at 14.3 L/m, average usage of 4 hours. Pressure of 13/9 cm with a set rate of 10.    I saw him on 06/28/2015, at which time he reported doing okay, tremor perhaps worse. He noticed no significant difference after we increase the Sinemet. Constipation was under control with MiraLAX. He was not exercising very much but does enjoy working in the yard. He had no recent falls. He was not using a cane or walker. He was compliant with his BiPAP ST. I suggested we keep his medications the same but monitor his leg swelling since he was on Mirapex.    I reviewed his BiPAP compliance data from 11/28/2015 through 12/27/2015, which is a total of 30 days, during which time he used his machine every night with percent used days greater than 4 hours at 73%, indicating adequate compliance with an average usage of only 4 hours and 21 minutes. Residual AHI 2 per hour, leak on the acceptable side with the 95th percentile at 13.4 L/m on a pressure of 13/9 with a rate of 10.   12/28/2014, at which time he reported doing fairly well, no recent falls, tremor somewhat worse and dexterity worse. He had noted some  trouble with his turns at times. He was on Sinemet 4 times a day, usually at 6 AM, 10 AM, 2 PM and 8 PM. He had constipation and was on a stool softener and prunes. He was to have a colonoscopy on 01/04/2015.    I reviewed his BiPAP ST compliance data from 05/29/2015 through 06/27/2015, which is a total of 30 days during which time he used his machine every night with percent used days greater than 4 hours at 97%, indicating excellent  compliance with an average usage of 5 hours and 16 minutes, residual AHI 1.5 per hour, leak acceptable with the 95th percentile at 12.4 L/m on a pressure of 13/9 cm with a rate of 10.   I saw him on 07/09/2014, at whicht time he reported feeling stable for the most part. He had not experienced any drastic changes in his symptoms. He reported no falls. He had no significant low back pain and no longer was taking any narcotics. Unfortunately, his wife was involved in a car accident and sustained broken ribs and had to be in the ICU for some time. He was using his BiPAP machine regularly. He reported one skip night because of a stomach bug. Overall, he felt he was sleeping well. He felt that the increase in Sinemet helped his trembling. He was tolerating his medication. He had no new major mood or memory issues.    I reviewed his BiPAP compliance data from 11/23/2014 through 12/22/2014 which is a total of 30 days during which time he used his machine every night with percent used days greater than 4 hours at 93%, indicating excellent compliance with an average usage of 4 hours and 54 minutes, residual AHI low at 1.7 per hour, leaked low with the 95th percentile at 7.4 L/m on a pressure of 13/9 cm with a backup rate of 10.    I saw him on 03/03/2014, at which time he reported experiencing thick mucus first thing in the morning. His tremor was worse per wife. He received new supplies recently. He has some thick mucus first thing in the morning. His tremor has become worse per wife.  His back surgery in November 2015 had relieved much of his pain. He was no longer on pain medication. He was taking stool softeners for his constipation. I suggested he continue with Mirapex 3 times a day but I did ask him to increase Sinemet to 4 times a day.   I reviewed his BiPAP compliance data from 06/07/2014 through 07/06/2014 which is a total of 30 days during which time he used his machine 29 days with percent used days greater than 4 hours at 87%, indicating very good compliance with an average usage of 5 hours and 2 minutes, residual AHI low at 1.6 per hour, leak low with the 95th percentile at 7.6 L/m on a pressure of 13/9 with a rate of 10.   I saw him on 12/02/2013, at which time he was compliant with BiPAP ST. He had spine surgery under Dr. Cyndy Freeze on 12/04/2013 which went well. He is in physical therapy. He had an L spine MRI on 11/06/13: Degenerative lumbar spondylosis with multilevel disc disease and facet disease. There is bilateral lateral recess and bilateral foraminal stenosis at L2-3 and L3-4. The most significant level however is L4-5 with there is severe spinal, bilateral lateral recess and foraminal stenosis. We talked about his fluid intake. He was still not consistent with his dose timings for his Sinemet and Mirapex.   I reviewed his compliance data from 01/27/2014 through 02/25/2014 which is a total of 30 days during which time he used his machine every night with percent used days greater than 4 hours of 87%, indicating very good compliance with an average usage of 5 hours and 6 minutes, residual AHI of 3.5 per hour and leak acceptable with the 95th percentile at 15 L/m.   I saw him on 05/27/2013, at which time he reported being compliant with his BiPAP, averaging 4-5 hours  each night. He was still taking his Sinemet with his meals on most days. He felt his tremor was a little worse. His wife felt that he was otherwise stable. He denied any new cognitive issues, depression,  hallucinations, anxiety, delusions. He was drinking 3 cups of coffee in the morning and not enough water. I did not increase his medication but asked him to take his Sinemet away from his mealtimes. I considered Linzess for chronic constipation but asked him to increase his water intake and watch constipation symptoms before we use medications.   I reviewed his compliance data from 10/31/2013 through 11/29/2013 which is a total of 30 days during which time he used his machine every night except for 1 night. Percent used days greater than 4 hours was 90% indicating excellent compliance. Residual AHI at 2.5 per hour, leaked low. Pressure at 13/9 with a rate of 10.   I saw him on 11/27/2012, at which time I did not change his medication regimen with the exception of the timing for his Sinemet and Mirapex: I advised him to take it at 7 AM, 11 AM and 4 PM. I also asked him to continue using his BiPAP regularly and take it with him on any vacation trips. He was congratulated on his compliance.   I saw him on 07/26/2012 after had his sleep study. He has been compliant on BiPAP therapy. I had increased his Sinemet to one pill 3 times a day. He was taking it with his meals and did not note any significant improvement in his symptoms, but, then again, he was taking it right after his meals. He was asking whether he should take his BiPAP machine with him to his planned trip to [place].     I first met him on 02/19/2012 after his baseline sleep study. His total AHI was 39.5 per hour based primarily on central apneas. His obstructive AHI was around 15 per hour. His oxyhemoglobin desaturation nadir was 85% and he spent 3 hours and 31 minutes below the saturation of 90% for the night. I asked him to come back for a CPAP titration study, possibly BiPAP but he wanted to hold off until his appointment in April as he was going to be out of town and he also wanted to bring his wife for discussion.   I then saw him back on  04/17/2012 and again went over his test results with him and his wife. He had been doing well from the PD standpoint. He agreed to come back for another sleep study with full night titration. His sleep titration study was on 05/14/2012 and I went over his test results with him and his wife in detail during our visit in July. Sleep efficiency was reduced at 71.5% with a latency to sleep of 2 minutes. Wake after sleep onset was highly elevated at 124 minutes with mild to moderate sleep fragmentation noted. He had increased percentage of REM sleep at 27.2%. He had a normal REM latency. There were no significant aortic leg movements. He was started on CPAP at a pressure of 5 cm and titrated up to 9 cm of water pressure but he had significant central apneas and therefore changed to BiPAP at 11/7 and then switch to ST mode for ongoing central events. His final pressure was 13/9 with a backup rate of 10 on which she had a residual AHI of 0 per hour and supine REM sleep achieved. Oxyhemoglobin desaturation nadir on the final pressure was 90%.  He was placed on BiPAP ST at 13/9 cm with a backup rate of 10. I reviewed his compliance from 06/26/2012 through 07/25/2012 (29 days), during which time he used it every day. His average usage was 5 hours and 6 minutes and percent used days greater than 4 hours was 96% indicating excellent compliance. His residual AHI was around 6 indicating fairly reasonable pressure settings. He reported tolerating the treatment and he changed from a FFM to a nasal mask. He denied depression, memory loss, lightheadedness, or hallucinations. I also reviewed more recent compliance data from 09/29/2012 through 10/28/2012 (30 days), during which time he used his machine every day. His average usage was 5 hours and 23 minutes, his percent used days greater than 4 hours was 29 days, which is 97%, indicating excellent compliance. His residual AHI was 2.6 per hour indicating an appropriate treatment  setting of 13/9 cm with a backup rate of 10 per minute.   His Past Medical History Is Significant For: Past Medical History:  Diagnosis Date  . Arthritis   . Brain mass    right lateral frontotemporal extra-axial mass 7.1 cm with typical features of meningioma 02/28/18 (to follow-up w/ neurosurgeon Dr. Vertell Limber)  . Cancer [facility])    h/o  skin  cancer  . Coronary artery disease    s/p DES to LAD (02/19/18)  . Ex-smoker   . GERD (gastroesophageal reflux disease)   . Hypercholesteremia   . Hypertension   . Lumbar stenosis   . OSA (obstructive sleep apnea)   . Pancreatic duct dilated   . Parkinson's disease ([place])   . S/P TAVR (transcatheter aortic valve replacement)   . Severe aortic stenosis     His Past Surgical History Is Significant For: Past Surgical History:  Procedure Laterality Date  . APPENDECTOMY    . BACK SURGERY  11/2013  . COLONOSCOPY  11/17/2008   Rourk: Sessile polyp in the cecum status post saline-assisted piecemeal polypectomy, resolution clipping and epinephrine injection therapy performed. Shallow sigmoid diverticula.. Tubular adenoma  . COLONOSCOPY  11/19/2008   Fields: Large amount of liquid blood with clots seen throughout the colon. Previous [place] resolution clip remained in place. Purple spot seen in the lateral aspect polypectomy site, 3 resolution clips placed here. 6 mm sessile ascending colon polyp seen and not manipulated.  . COLONOSCOPY  11/25/2008   Rourk: Blood-tinged colonic effluent, suspect recurrent post polypectomy bleeding status post placement of 3 clips on the polypectomy site on top of the 3 clips that already existed, one had fallen off since last procedure. One small polyp distal to the cecum not manipulated  . COLONOSCOPY  12/06/2009   Rourk: Internal hemorrhoids, scattered left-sided diverticula, cecal polyps, one snared and subsequently clipped, 2 adjacent diminutive polyps ablated. tubular adenoma  . COLONOSCOPY N/A 01/21/2015   Dr. Gala Romney: 1.5  cm carpet-type polyp just distal to the ileocecal valve removed piecemeal fashion and ablated. Small polyp in the rectum. Pathology-tubular adenomas  . COLONOSCOPY N/A 09/01/2015   Procedure: COLONOSCOPY;  Surgeon: Daneil Dolin, MD;  [facility];  Service: Endoscopy;  Laterality: N/A;  845  . CORONARY STENT INTERVENTION N/A 02/19/2018   Procedure: CORONARY STENT INTERVENTION;  Surgeon: [name], MD;  Location: [place] CV LAB;  Service: Cardiovascular;  Laterality: N/A;  . EYE SURGERY     bilateral  cataracts  . INTRAVASCULAR PRESSURE WIRE/FFR STUDY N/A 02/19/2018   Procedure: INTRAVASCULAR PRESSURE WIRE/FFR STUDY;  Surgeon: [name], MD;  Location: [place]  CV LAB;  Service: Cardiovascular;  Laterality: N/A;  . MENISCUS REPAIR     right knee  2000  . POLYPECTOMY  09/01/2015   Procedure: POLYPECTOMY;  Surgeon: Daneil Dolin, MD;  [facility];  Service: Endoscopy;;  colon  . RIGHT/LEFT HEART CATH AND CORONARY ANGIOGRAPHY N/A 02/19/2018   Procedure: RIGHT/LEFT HEART CATH AND CORONARY ANGIOGRAPHY;  Surgeon: [name], MD;  Location: [place] CV LAB;  Service: Cardiovascular;  Laterality: N/A;  . TEE WITHOUT CARDIOVERSION N/A 03/19/2018   Procedure: TRANSESOPHAGEAL ECHOCARDIOGRAM (TEE);  Surgeon: [name], MD;  Location: [place] CV LAB;  Service: Open Heart Surgery;  Laterality: N/A;  . TONSILLECTOMY Bilateral   . TRANSCATHETER AORTIC VALVE REPLACEMENT, TRANSFEMORAL N/A 03/19/2018   Procedure: TRANSCATHETER AORTIC VALVE REPLACEMENT, TRANSFEMORAL;  Surgeon: [name], MD;  Location: [place] [facility] LAB;  Service: Open Heart Surgery;  Laterality: N/A;    His Family History Is Significant For: Family History  Problem Relation Age of Onset  . Dementia Mother   . Cancer Brother   . Stroke Brother     His Social History Is Significant For: Social History   Socioeconomic History  . Marital status: Married    Spouse name: Clinical research associate  .  Number of children: 4  . Years of education: 4  . Highest education level: Not on file  Occupational History  . Occupation: Retired   Scientific laboratory technician  . Financial resource strain: Not on file  . Food insecurity    Worry: Not on file    Inability: Not on file  . Transportation needs    Medical: Not on file    Non-medical: Not on file  Tobacco Use  . Smoking status: Former Smoker    Packs/day: 1.00    Types: Cigarettes, Pipe    Start date: 01/22/1951    Quit date: 01/21/1989    Years since quitting: 29.4  . Smokeless tobacco: Never Used  Substance and Sexual Activity  . Alcohol use: No    Alcohol/week: 0.0 standard drinks  . Drug use: No  . Sexual activity: Not on file  Lifestyle  . Physical activity    Days per week: Not on file    Minutes per session: Not on file  . Stress: Not on file  Relationships  . Social Herbalist on phone: Not on file    Gets together: Not on file    Attends religious service: Not on file    Active member of club or organization: Not on file    Attends meetings of clubs or organizations: Not on file    Relationship status: Not on file  Other Topics Concern  . Not on file  Social History Narrative  . Not on file    His Allergies Are:  No Known Allergies:   His Current Medications Are:  Outpatient Encounter Medications as of 07/15/2018  Medication Sig  . amoxicillin (AMOXIL) 500 MG tablet Take 4 tablets (2,000 mg) one hour prior to all dental visits.  Marland Kitchen aspirin EC 81 MG tablet Take 1 tablet (81 mg total) by mouth daily.  Marland Kitchen atorvastatin (LIPITOR) 40 MG tablet TAKE 1 TABLET BY MOUTH  DAILY AT 6PM  . brimonidine (ALPHAGAN) 0.2 % ophthalmic solution Place 1 drop into both eyes 3 (three) times daily.   . calcium gluconate 500 MG tablet Take 500 mg by mouth daily.  . carbidopa-levodopa (SINEMET IR) 25-100 MG tablet Take 2 at 6, 1 at 9, 2 at  12PM, 1 at 3PM, 2 pills at [place], and 1 at 9 PM  . clopidogrel (PLAVIX) 75 MG tablet TAKE 1 TABLET BY  MOUTH  DAILY WITH BREAKFAST  . dorzolamide-timolol (COSOPT) 22.3-6.8 MG/ML ophthalmic solution Place 1 drop into both eyes 2 (two) times daily.   . furosemide (LASIX) 40 MG tablet Take 1 tablet (40 mg total) by mouth daily.  Marland Kitchen glucosamine-chondroitin 500-400 MG tablet Take 1 tablet by mouth daily.   Marland Kitchen latanoprost (XALATAN) 0.005 % ophthalmic solution Place 1 drop into both eyes at bedtime.   Marland Kitchen losartan (COZAAR) 100 MG tablet Take 1 tablet (100 mg total) by mouth daily.  . metoprolol succinate (TOPROL XL) 25 MG 24 hr tablet Take 1 tablet (25 mg total) by mouth daily.  . Multiple Vitamin (MULTIVITAMIN) tablet Take 1 tablet by mouth daily.  . nitroGLYCERIN (NITROSTAT) 0.4 MG SL tablet Place 1 tablet (0.4 mg total) under the tongue every 5 (five) minutes as needed.  . pantoprazole (PROTONIX) 20 MG tablet TAKE 1 TABLET BY MOUTH  DAILY  . potassium chloride (K-DUR) 10 MEQ tablet Take 1 tablet (10 mEq total) by mouth daily.  . pramipexole (MIRAPEX) 0.5 MG tablet Take 1 tablet (0.5 mg total) by mouth 3 (three) times daily.  . vitamin C (ASCORBIC ACID) 500 MG tablet Take 500 mg by mouth daily.  Marland Kitchen amLODipine (NORVASC) 5 MG tablet Take 1 tablet (5 mg total) by mouth daily.  . [DISCONTINUED] atorvastatin (LIPITOR) 40 MG tablet Take 1 tablet (40 mg total) by mouth daily at 6 PM.  . [DISCONTINUED] clopidogrel (PLAVIX) 75 MG tablet Take 1 tablet (75 mg total) by mouth daily with breakfast.  . [DISCONTINUED] pantoprazole (PROTONIX) 20 MG tablet Take 1 tablet (20 mg total) by mouth daily.   No facility-administered encounter medications on file as of 07/15/2018.   :  Review of Systems:  Out of a complete 14 point review of systems, all are reviewed and negative with the exception of these symptoms as listed below: Review of Systems  Neurological:       Pt presents today to follow up on his PD. Pt reports that he has been taking mirapex BID. Pt reports his bipap is broken and he has been trying to get a new  one.    Objective:  Neurological Exam  Physical Exam Physical Examination:   Vitals:   07/15/18 0934  BP: (!) 147/78  Pulse: 77    General Examination: The patient is a very pleasant 85 y.o. male in no acute distress. He appears more frail today. Well groomed.   HEENT: He has a moderate degree of nuchal rigidity with a mildto moderatelower jaw and lip tremor, fairly constant. He has a moderately masked facies with decreased eye blink rate. Hearing is mildly impaired. He is status post cataract repairs bilaterally. He has prescription eyeglasses. Speech is moderately to severely hypophonic, with minimal dysarthria noted, no significant drooling. Pupils are reactive to light, extraocular tracking is difficult for him.  Chest is clear to auscultation without wheezing or rhonchi noted.  Heart sounds are normal with a stable appearing systolic murmur.  Abdomen is soft, nontender with normal bowel sounds appreciated.  There is1+ edema in the distal lower extremities bilaterally.  No joint deformities are noted.   Skin is warm and dry but he does have several old appearing bruises across the dorsi of his hands, worse than before. Chronic hyperpigmentation/discoloration in the Distal lower extremities bilaterally.  Neurologically: Mental status: The patient is  awake, alert and oriented in all 3 spheres. His memory, attention, language and knowledge are fairly appropriate. He has some slowness in responding. Cranial nerves are as described under HEENT exam. In addition airway exam reveals a moderately tight airway secondary to a narrow airway entry.   Motor exam: Normal bulk and strength is noted. Tone is increased with some cogwheeling noted in both upper extremities, L more than R. Tone is also increased in the left lower extremity. Tone is mildly increased on the right side. He has a moderate constant resting tremor in the left upper extremity. He has an intermittent mild  resting tremor on the right upper extremity and an intermittent resting tremor in the right lower extremity as well. Fine motor skills with finger taps and hand movements as well as rapid alternating pattingaremoderately to severely impaired on the left and moderately so on the right. Foot taps are moderately impaired on the right and moderately to severely so on the left. He stands up from the seated position with mild problems and pushes himself up. Moderately stooped posture is noted. He walks with decreased arm swing bilaterally, L more than right. He has a decreased stride length and pace and turns in 3 steps. His balance is mildly impaired with more insecurity with turns noted today. Cerebellar testing shows no dysmetria or intention tremor. Sensory exam is intact to light touch.    Assessment and Plan:   In summary, [name] is a very pleasant 85 year old male with an underlying medical history of hypertension, hyperlipidemia, history of cancer, ex-smoker, and complex sleep apnea on BiPAP ST, who presents for followup consultation off his left-sided predominant Parkinson's disease as well as hiscomplex sleep apnea, for which he is on BiPAP ST. His compliance had improved in the past and he has Benefited from treatment. He needs a new BiPAP ST machine as his machine broke.He should be eligible for a new machine. He has still some difficulty with sleep, has been taking melatonin.  He is sleepy during the day and for that reason I had reduced his Mirapex.  We will continue with his current medication regimen but will consider reducing his Mirapex further and increasing his Sinemet next time.  He feels that he is fairly stable currently on his medications.  He is not keen on changing his regimen.  He is advised to seek consultation with outpatient physical therapy, Occupational Therapy and speech therapy.  I think he would benefit from evaluation and therapy potentially especially since his  balance seems a little worse and his speech is more difficult to understand today.  He would be agreeable with a referral to neuro rehab. He has had intermittent issues with constipation. He has had back pain with status post back surgery on 12/04/2013 with good success. He is tolerating the Sinemet which is currently at 2 pills alternating with one pill for total of 9 pills daily. He is s/p AVR in March 2020.  He is reminded to be very proactive about constipation issues. I suggested a 3 month follow-up routinely. I answered all their questions today and the patient and his daughter were in agreement.  I spent 25 minutes in total face-to-face time with the patient, more than 50% of which was spent in counseling and coordination of care, reviewing test results, reviewing medication and discussing or reviewing the diagnosis of PD, CSA and OSA, its prognosis and treatment options. Pertinent laboratory and imaging test results that were available during this visit with  the patient were reviewed by me and considered in my medical decision making (see chart for details).

## 2018-07-15 NOTE — Patient Instructions (Signed)
I would like to make a Referral to neuro rehab to have you evaluated again with PT, OT and ST.  Please be mindful about balance issues, you have more noticeable insecurity with turns.  Please stay well-hydrated with water, 6 to 8 cups/day are recommended.  We will continue with your medications but at our next visit we may reduce the Mirapex further and potentially increase your Sinemet.  I will write for a new BiPAP ST machine.

## 2018-07-16 DIAGNOSIS — T25221D Burn of second degree of right foot, subsequent encounter: Secondary | ICD-10-CM | POA: Diagnosis not present

## 2018-07-31 ENCOUNTER — Ambulatory Visit (INDEPENDENT_AMBULATORY_CARE_PROVIDER_SITE_OTHER): Payer: Medicare Other | Admitting: Cardiovascular Disease

## 2018-07-31 ENCOUNTER — Encounter: Payer: Self-pay | Admitting: Cardiovascular Disease

## 2018-07-31 ENCOUNTER — Other Ambulatory Visit: Payer: Self-pay

## 2018-07-31 VITALS — BP 103/61 | HR 83 | Temp 99.3°F | Ht 70.0 in | Wt 197.0 lb

## 2018-07-31 DIAGNOSIS — I10 Essential (primary) hypertension: Secondary | ICD-10-CM | POA: Diagnosis not present

## 2018-07-31 DIAGNOSIS — I25118 Atherosclerotic heart disease of native coronary artery with other forms of angina pectoris: Secondary | ICD-10-CM

## 2018-07-31 DIAGNOSIS — I714 Abdominal aortic aneurysm, without rupture, unspecified: Secondary | ICD-10-CM

## 2018-07-31 DIAGNOSIS — Z955 Presence of coronary angioplasty implant and graft: Secondary | ICD-10-CM

## 2018-07-31 DIAGNOSIS — Z952 Presence of prosthetic heart valve: Secondary | ICD-10-CM | POA: Diagnosis not present

## 2018-07-31 DIAGNOSIS — G4730 Sleep apnea, unspecified: Secondary | ICD-10-CM

## 2018-07-31 DIAGNOSIS — I5032 Chronic diastolic (congestive) heart failure: Secondary | ICD-10-CM | POA: Diagnosis not present

## 2018-07-31 NOTE — Progress Notes (Signed)
SUBJECTIVE: The patient presents for routine follow-up.  He underwent TAVR on 03/19/2018.  He also has an infrarenal abdominal aortic aneurysm followed by vascular surgery as well as Parkinson's and complex sleep apnea followed by neurology. He also has coronary artery disease and underwent drug-eluting stent placement to the LAD on 02/19/2018.  He had a mechanical fall with a benign brain mass found likely a meningioma on 02/28/2018.  The patient denies any symptoms of chest pain, palpitations, shortness of breath, lightheadedness, dizziness, leg swelling, orthopnea, PND, and syncope.  He said his appetite is good.  He denies fevers and chills.   Review of Systems: As per "subjective", otherwise negative.  No Known Allergies  Current Outpatient Medications  Medication Sig Dispense Refill  . amLODipine (NORVASC) 5 MG tablet Take 1 tablet (5 mg total) by mouth daily. 90 tablet 3  . amoxicillin (AMOXIL) 500 MG tablet Take 4 tablets (2,000 mg) one hour prior to all dental visits. 8 tablet 6  . aspirin EC 81 MG tablet Take 1 tablet (81 mg total) by mouth daily. 90 tablet 3  . atorvastatin (LIPITOR) 40 MG tablet TAKE 1 TABLET BY MOUTH  DAILY AT 6PM 90 tablet 1  . brimonidine (ALPHAGAN) 0.2 % ophthalmic solution Place 1 drop into both eyes 3 (three) times daily.     . calcium gluconate 500 MG tablet Take 500 mg by mouth daily.    . carbidopa-levodopa (SINEMET IR) 25-100 MG tablet Take 2 at 6, 1 at 9, 2 at 12PM, 1 at 3PM, 2 pills at 6PM, and 1 at 9 PM 810 tablet 3  . clopidogrel (PLAVIX) 75 MG tablet TAKE 1 TABLET BY MOUTH  DAILY WITH BREAKFAST 90 tablet 0  . dorzolamide-timolol (COSOPT) 22.3-6.8 MG/ML ophthalmic solution Place 1 drop into both eyes 2 (two) times daily.     . furosemide (LASIX) 40 MG tablet Take 1 tablet (40 mg total) by mouth daily. 90 tablet 3  . glucosamine-chondroitin 500-400 MG tablet Take 1 tablet by mouth daily.     Marland Kitchen latanoprost (XALATAN) 0.005 % ophthalmic solution  Place 1 drop into both eyes at bedtime.     Marland Kitchen losartan (COZAAR) 100 MG tablet Take 1 tablet (100 mg total) by mouth daily. 90 tablet 3  . metoprolol succinate (TOPROL XL) 25 MG 24 hr tablet Take 1 tablet (25 mg total) by mouth daily. 90 tablet 3  . Multiple Vitamin (MULTIVITAMIN) tablet Take 1 tablet by mouth daily.    . nitroGLYCERIN (NITROSTAT) 0.4 MG SL tablet Place 1 tablet (0.4 mg total) under the tongue every 5 (five) minutes as needed. 25 tablet 12  . pantoprazole (PROTONIX) 20 MG tablet TAKE 1 TABLET BY MOUTH  DAILY 90 tablet 1  . potassium chloride (K-DUR) 10 MEQ tablet Take 1 tablet (10 mEq total) by mouth daily. 90 tablet 3  . pramipexole (MIRAPEX) 0.5 MG tablet Take 1 tablet (0.5 mg total) by mouth 3 (three) times daily. 270 tablet 3  . vitamin C (ASCORBIC ACID) 500 MG tablet Take 500 mg by mouth daily.     No current facility-administered medications for this visit.     Past Medical History:  Diagnosis Date  . Arthritis   . Brain mass    right lateral frontotemporal extra-axial mass 7.1 cm with typical features of meningioma 02/28/18 (to follow-up w/ neurosurgeon Dr. Vertell Limber)  . Cancer [facility])    h/o  skin  cancer  . Coronary artery disease  s/p DES to LAD (02/19/18)  . Ex-smoker   . GERD (gastroesophageal reflux disease)   . Hypercholesteremia   . Hypertension   . Lumbar stenosis   . OSA (obstructive sleep apnea)   . Pancreatic duct dilated   . Parkinson's disease ([place])   . S/P TAVR (transcatheter aortic valve replacement)   . Severe aortic stenosis     Past Surgical History:  Procedure Laterality Date  . APPENDECTOMY    . BACK SURGERY  11/2013  . COLONOSCOPY  11/17/2008   Rourk: Sessile polyp in the cecum status post saline-assisted piecemeal polypectomy, resolution clipping and epinephrine injection therapy performed. Shallow sigmoid diverticula.. Tubular adenoma  . COLONOSCOPY  11/19/2008   Fields: Large amount of liquid blood with clots seen throughout the colon.  Previous [place] resolution clip remained in place. Purple spot seen in the lateral aspect polypectomy site, 3 resolution clips placed here. 6 mm sessile ascending colon polyp seen and not manipulated.  . COLONOSCOPY  11/25/2008   Rourk: Blood-tinged colonic effluent, suspect recurrent post polypectomy bleeding status post placement of 3 clips on the polypectomy site on top of the 3 clips that already existed, one had fallen off since last procedure. One small polyp distal to the cecum not manipulated  . COLONOSCOPY  12/06/2009   Rourk: Internal hemorrhoids, scattered left-sided diverticula, cecal polyps, one snared and subsequently clipped, 2 adjacent diminutive polyps ablated. tubular adenoma  . COLONOSCOPY N/A 01/21/2015   Dr. Gala Romney: 1.5 cm carpet-type polyp just distal to the ileocecal valve removed piecemeal fashion and ablated. Small polyp in the rectum. Pathology-tubular adenomas  . COLONOSCOPY N/A 09/01/2015   Procedure: COLONOSCOPY;  Surgeon: Daneil Dolin, MD;  [facility];  Service: Endoscopy;  Laterality: N/A;  845  . CORONARY STENT INTERVENTION N/A 02/19/2018   Procedure: CORONARY STENT INTERVENTION;  Surgeon: [name], MD;  Location: [place] CV LAB;  Service: Cardiovascular;  Laterality: N/A;  . EYE SURGERY     bilateral  cataracts  . INTRAVASCULAR PRESSURE WIRE/FFR STUDY N/A 02/19/2018   Procedure: INTRAVASCULAR PRESSURE WIRE/FFR STUDY;  Surgeon: [name], MD;  Location: [place] [facility] LAB;  Service: Cardiovascular;  Laterality: N/A;  . MENISCUS REPAIR     right knee  2000  . POLYPECTOMY  09/01/2015   Procedure: POLYPECTOMY;  Surgeon: Daneil Dolin, MD;  [facility]: [facility];  Service: Endoscopy;;  colon  . RIGHT/LEFT HEART CATH AND CORONARY ANGIOGRAPHY N/A 02/19/2018   Procedure: RIGHT/LEFT HEART CATH AND CORONARY ANGIOGRAPHY;  Surgeon: [name], MD;  Location: [place] [facility];  Service: Cardiovascular;  Laterality: N/A;  . TEE WITHOUT  CARDIOVERSION N/A 03/19/2018   Procedure: TRANSESOPHAGEAL ECHOCARDIOGRAM (TEE);  Surgeon: [name], MD;  Location: [place] [facility];  Service: Open Heart Surgery;  Laterality: N/A;  . TONSILLECTOMY Bilateral   . TRANSCATHETER AORTIC VALVE REPLACEMENT, TRANSFEMORAL N/A 03/19/2018   Procedure: TRANSCATHETER AORTIC VALVE REPLACEMENT, TRANSFEMORAL;  Surgeon: [name], MD;  Location: [place] [facility];  Service: Open Heart Surgery;  Laterality: N/A;    Social History   Socioeconomic History  . Marital status: Married    Spouse name: Clinical research associate  . Number of children: 4  . Years of education: 26  . Highest education level: Not on file  Occupational History  . Occupation: Retired   Scientific laboratory technician  . Financial resource strain: Not on file  . Food insecurity    Worry: Not on file    Inability: Not on file  .  Transportation needs    Medical: Not on file    Non-medical: Not on file  Tobacco Use  . Smoking status: Former Smoker    Packs/day: 1.00    Types: Cigarettes, Pipe    Start date: 01/22/1951    Quit date: 01/21/1989    Years since quitting: 29.5  . Smokeless tobacco: Never Used  Substance and Sexual Activity  . Alcohol use: No    Alcohol/week: 0.0 standard drinks  . Drug use: No  . Sexual activity: Not on file  Lifestyle  . Physical activity    Days per week: Not on file    Minutes per session: Not on file  . Stress: Not on file  Relationships  . Social Herbalist on phone: Not on file    Gets together: Not on file    Attends religious service: Not on file    Active member of club or organization: Not on file    Attends meetings of clubs or organizations: Not on file    Relationship status: Not on file  . Intimate partner violence    Fear of current or ex partner: Not on file    Emotionally abused: Not on file    Physically abused: Not on file    Forced sexual activity: Not on file  Other Topics Concern  . Not on file  Social History Narrative  . Not  on file     Vitals:   07/31/18 0819  BP: 103/61  Pulse: 83  Temp: 99.3 F (37.4 C)  Weight: 197 lb (89.4 kg)  Height: 5\' 10"  (1.778 m)    Wt Readings from Last 3 Encounters:  07/31/18 197 lb (89.4 kg)  07/15/18 197 lb (89.4 kg)  06/26/18 194 lb 9.6 oz (88.3 kg)     PHYSICAL EXAM General: NAD HEENT: Normal. Neck: No JVD, no thyromegaly. Lungs: Clear to auscultation bilaterally with normal respiratory effort. CV: Regular rate and rhythm, normal S1/S2, no C5/E5, soft systolic murmur over RUSB. No pretibial or periankle edema.  Abdomen: Soft, nontender, no distention.  Neurologic: Alert and oriented.  Psych: Normal affect. Skin: Normal. Musculoskeletal: No gross deformities.    ECG: Reviewed above under Subjective   Labs: Lab Results  Component Value Date/Time   K 4.1 03/28/2018 04:30 PM   BUN 30 (H) 03/28/2018 04:30 PM   CREATININE 1.10 06/28/2018 09:04 AM   ALT <5 03/18/2018 01:24 PM   HGB 13.7 03/20/2018 05:33 AM   HGB 16.2 02/15/2018 10:16 AM     Lipids: Lab Results  Component Value Date/Time   LDLCALC 93 02/20/2018 07:14 AM   CHOL 144 02/20/2018 07:14 AM   TRIG 68 02/20/2018 07:14 AM   HDL 37 (L) 02/20/2018 07:14 AM     Echocardiogram 05/28/2018:   1. The left ventricle has normal systolic function with an ejection fraction of 60-65%. The cavity size was normal. There is mildly increased left ventricular wall thickness. Left ventricular diastolic Doppler parameters are consistent with impaired  relaxation. No evidence of left ventricular regional wall motion abnormalities.  2. The right ventricle has normal systolic function. The cavity was normal. There is no increase in right ventricular wall thickness. Right ventricular systolic pressure normal with an estimated pressure of 26.2 mmHg.  3. Status post 29 mm Edwards Sapien 3 THV. Normal valve gradients. There is a trace degree of perivalvular regurgitation noted in long axis views at the base of the  septum, not wll seen in short axis views.  4. Left atrial size was moderately dilated.  5. The mitral valve is degenerative. Mild thickening of the mitral valve leaflet. Mild calcification of the mitral valve leaflet. There is mild mitral annular calcification present.  6. The tricuspid valve is grossly normal.  7. The aortic root is normal in size and structure.  8. The inferior vena cava was dilated in size with >50% respiratory variability.  9. Cannot exclude very small interatrial left to right shunt.   Cardiac catheterization 02/19/2018:   1st Diag lesion is 50% stenosed.  Ost 1st Mrg to 1st Mrg lesion is 30% stenosed.   1.  Severe RCA stenosis with subtotal occlusion of the distal RCA and collaterals from the left coronary artery supplying the PDA and PL branches 2.  Severe mid LAD stenosis with abnormal pressure wire analysis, treated successfully with PCI using a drug-eluting stent 3.  Patent left mainstem and left circumflex 4.  Severe aortic stenosis with mean transvalvular gradient 30 mmHg and peak gradient 42 mmHg  Recommendations: Dual antiplatelet therapy with aspirin and clopidogrel for at least 6 months.  Continue TAVR evaluation.  Medical therapy for residual CAD.   ASSESSMENT AND PLAN: 1.  Severe aortic stenosis status post TAVR: Symptomatically stable and doing well.  He will need SBE prophylaxis for dental procedures.  Plavix can be discontinued after 6 months of therapy in September 2020.  Aspirin will be continued indefinitely.  2.  Coronary artery disease: Status post drug-eluting stent placement to the LAD on 02/19/2018.  Symptomatically stable.  Continue aspirin, atorvastatin, Plavix, and Toprol-XL.  3.  Abdominal aortic aneurysm: Followed by vascular surgery.  4.9 cm.  4.  Hypertension: Blood pressure is normal.  5.  Complex sleep apnea: Followed by neurology in [place] (Dr. Rexene Alberts).  6.  Chronic diastolic heart failure: Euvolemic on Lasix 40 mg daily.  He  take supplemental potassium as well.    Disposition: Follow up 6 months   Kate Sable, M.D., F.A.C.C.

## 2018-07-31 NOTE — Patient Instructions (Signed)

## 2018-08-13 DIAGNOSIS — H401134 Primary open-angle glaucoma, bilateral, indeterminate stage: Secondary | ICD-10-CM | POA: Diagnosis not present

## 2018-08-13 DIAGNOSIS — Z961 Presence of intraocular lens: Secondary | ICD-10-CM | POA: Diagnosis not present

## 2018-08-13 DIAGNOSIS — H04123 Dry eye syndrome of bilateral lacrimal glands: Secondary | ICD-10-CM | POA: Diagnosis not present

## 2018-08-22 ENCOUNTER — Ambulatory Visit: Payer: Medicare Other | Admitting: Cardiovascular Disease

## 2018-09-06 ENCOUNTER — Other Ambulatory Visit: Payer: Self-pay | Admitting: Neurology

## 2018-09-06 DIAGNOSIS — G20 Parkinson's disease: Secondary | ICD-10-CM

## 2018-10-09 DIAGNOSIS — L853 Xerosis cutis: Secondary | ICD-10-CM | POA: Diagnosis not present

## 2018-10-09 DIAGNOSIS — L570 Actinic keratosis: Secondary | ICD-10-CM | POA: Diagnosis not present

## 2018-10-09 DIAGNOSIS — D1801 Hemangioma of skin and subcutaneous tissue: Secondary | ICD-10-CM | POA: Diagnosis not present

## 2018-10-09 DIAGNOSIS — L821 Other seborrheic keratosis: Secondary | ICD-10-CM | POA: Diagnosis not present

## 2018-10-09 DIAGNOSIS — Z85828 Personal history of other malignant neoplasm of skin: Secondary | ICD-10-CM | POA: Diagnosis not present

## 2018-10-09 DIAGNOSIS — D692 Other nonthrombocytopenic purpura: Secondary | ICD-10-CM | POA: Diagnosis not present

## 2018-10-09 DIAGNOSIS — L812 Freckles: Secondary | ICD-10-CM | POA: Diagnosis not present

## 2018-10-15 ENCOUNTER — Ambulatory Visit (INDEPENDENT_AMBULATORY_CARE_PROVIDER_SITE_OTHER): Payer: Medicare Other | Admitting: Neurology

## 2018-10-15 ENCOUNTER — Other Ambulatory Visit: Payer: Self-pay

## 2018-10-15 ENCOUNTER — Encounter: Payer: Self-pay | Admitting: Neurology

## 2018-10-15 VITALS — BP 140/62 | HR 80 | Ht 71.0 in | Wt 192.0 lb

## 2018-10-15 DIAGNOSIS — G20 Parkinson's disease: Secondary | ICD-10-CM

## 2018-10-15 DIAGNOSIS — G4731 Primary central sleep apnea: Secondary | ICD-10-CM | POA: Diagnosis not present

## 2018-10-15 DIAGNOSIS — G20A1 Parkinson's disease without dyskinesia, without mention of fluctuations: Secondary | ICD-10-CM

## 2018-10-15 DIAGNOSIS — I25118 Atherosclerotic heart disease of native coronary artery with other forms of angina pectoris: Secondary | ICD-10-CM

## 2018-10-15 DIAGNOSIS — G4733 Obstructive sleep apnea (adult) (pediatric): Secondary | ICD-10-CM

## 2018-10-15 NOTE — Progress Notes (Signed)
Subjective:    Patient ID: [name] is a 85 y.o. male.  HPI     Interim history:   [name] is a very pleasant 85 year old right-handed gentleman with an underlying medical history of hypertension, hyperlipidemia, history of cancer, ex-smoker, CHF, aortic stenosis with recent aortic valve replacement in March 2020, and complex sleep apnea on BiPAP ST, with who presents for follow-up consultation of his left-sided predominant Parkinson's disease as well as complex sleep apnea, on treatment with BiPAP ST.  The patient is unaccompanied today.  I last saw him on 07/15/2018, at which time his BiPAP machine was not functional.  I wrote for a new machine.  He had lower extremity swelling, he had issues intermittently with constipation.  He had some intermittent balance problems.   Today, 10/15/2018: I reviewed his BiPAP compliance data from 09/15/2018 through 10/14/2018 which is a total of 30 days, during which time he used his BiPAP 26 days with percent use days greater than 4 hours at 23% only, indicating significantly suboptimal compliance with an average usage of 3 hours and 44 minutes, residual AHI slightly elevated at 9.4/h, leak high consistently with a 95th percentile at 50.4 L/min on a pressure of 13/9 with a backup rate of 10.  In the past nearly 90 days his compliance has been similar.  He has some discomfort with a full facemask.  He would be willing to try a different mask.  He has fallen once, reports that he tripped over the cat.  He drove himself today and indicates that his driving is okay.  His wife had a reaction while she was getting iron infusion a couple of weeks ago.  He indicates that she had a cardiac arrest and needed CPR.  She was kept overnight.  She is supposed to see her cardiologist and has seen her primary care physician.  This is of course a major stressor for him as well.   The patient's allergies, current medications, family history, past medical history, past social  history, past surgical history and problem list were reviewed and updated as appropriate.    Previously (copied from previous notes for reference):    I saw him in virtual video visit on 04/11/2018, at which time we mutually agreed to reduce his Mirapex due to sleepiness reported.  He was advised to skip the midday dose and we would monitor his symptoms, perhaps further reduce or even eliminate his Mirapex altogether.  He was advised to take melatonin at night in the evening before bedtime to help him sleep.  He was advised to stay compliant with BiPAP ST.  He has benefited from the BiPAP ST and was eligible for a new machine soon.  He was supposed to see Dr. Vertell Limber in neurosurgery for a brain lesion.   He had an interim brain MRI with and without contrast as ordered by neurosurgery on 06/28/2018 which showed a stable enhancing extraaxial mass in keeping with meningioma: IMPRESSION: 1. Stable appearance of extra-axial tumor with dural enhancement and typical characteristics of meningioma spanning from the anterior cranial fossa to the middle cranial fossa. 2. Stable mass effect. 3. Atrophy and white matter disease unchanged. 4. No acute intracranial abnormality or significant interval change.       I saw him on 08/20/2017, at which time he was not fully compliant with his BiPAP. He felt fairly stable as far as his PD. He had increased stress, his wife had been in and out of the hospital. He was  trying to exercise regularly. We mutually agreed to increase his Sinemet to 2 pills alternating with one pill for a total of 9 pills.   Note, he was diagnosed with severe aortic stenosis in the interim and had a left heart catheterization in February 2020. He had aortic valve replacement on 03/19/2018.   Of note, the patient had an interim emergency room visit on 02/28/2018 after a fall.   He had a head CT and cervical spine CT without contrast on 02/28/2018 and I reviewed the results:  IMPRESSION: Masslike lentiform isodense extra-axial collection over the right frontotemporal region measuring 7.0 x 3.3 cm in AP and transverse dimension. Couple small more hyperdense nodular foci along the medial aspect of this collection suggesting hemorrhagic foci. There is mild mass effect on the adjacent cortex although a plane of CSF is maintained. Midline shift 6 mm to the left. This appears to be more typical of a subacute to chronic process and may represent a subacute epidural hematoma versus extra-axial mass such as a meningioma. Recommend clinical correlation as MRI with without contrast may be helpful for further evaluation.   No acute cervical spine injury.   Minimal chronic ischemic microvascular disease.   Mild spondylosis of the cervical spine. Disc disease at the C3-4 level.   He had a subsequent brain MRI with and without contrast on 02/28/2018 and I reviewed the results: IMPRESSION: 1. Right lateral frontotemporal extra-axial mass measuring up to 7.1 cm with typical features of meningioma. Differential of metastasis or hemangiopericytoma are less likely. There is a irregular peripheral multi cystic component that is in contact with the surface of the brain, however, there is no appreciable invasion or edema of the adjacent brain. 2. Mass effect results in displacement of the adjacent brain, partial effacement of right lateral ventricle, and 7 mm right-to-left midline shift. 3. Background of mild chronic microvascular ischemic changes and volume loss of the brain.   I reviewed the emergency room records from 02/28/2018. He was advised to follow-up with Dr. Vertell Limber in neurosurgery as an outpatient.   I saw him on 02/19/2017, at which time he was not fully compliant with BiPAP. Overall, his Parkinson symptoms have progressed. His wife had been hospitalized. [name] was in town. I suggested we increase his Sinemet to 1-1/2 pills alternating with  one pill for a total of 7-1/2 pills daily from previously 6 pills daily. He was encouraged to continue with his BiPAP as best as possible. We talked about the importance of hydration and constipation management.   I reviewed his BiPAP compliance data from 07/21/2017 through 08/19/2017 which is a total of 30 days, during which time he used his machine every night with percent used days greater than 4 hours at 40%, indicating suboptimal compliance with an average usage of 3 hours and 48 minutes, residual AHI at goal at 1.8 per hour, leak acceptable with the 95th percentile at 13.8 L/m on a pressure of 13/9 cm with a backup rate of 10/m.    I saw him on 08/17/2016, at which time he was suboptimal with his BiPAP compliance. He felt that his tremor was worse. He had no recent falls, constipation was also under control. He was not always exercising on a regular basis. His wife was concerned about his daytime somnolence. His wife felt that his driving was still okay, he limited his driving to daylight driving in familiar routes. I asked him to continue with Sinemet 1 pill 6 times a day and  Mirapex 0.5 mg 3 times a day. I asked him to monitor his driving and to be consistent with his BiPAP usage.   I reviewed his BiPAP ST compliance data from 01/20/2017 through 02/24/2017, during which time he used his BiPAP machine every night but percent used days greater than 4 hours was only 17%, indicating suboptimal compliance with an average usage of 3 hours and 18 minutes. Residual AHI at goal at 2.1 per hour, leak acceptable with the 95th percentile at 17.9 L/m on a pressure of 13/9 with a backup rate of 10.    I saw him on 12/28/15, and which time he was doing overall fairly well. He was adequate with his BiPAP compliance. He had noted an increase in his tremor on the left side and also increase in stiffness. No recent falls for reported. His primary care reduced his amlodipine, he had more drooling. He had more nasal  discharge, usually clear and very drippy at times. I asked him to increase his Sinemet to 1 pill 6 times a day. I suggested he continue with Mirapex at the same dose and will monitor his leg swelling. He was reminded to increase his water intake.   I reviewed his BiPAP ST compliance data from 07/18/2016 through 08/16/2016, which is a total of 30 days, during which time he used his BiPAP 29 days but percent used days greater than 4 hours was only 47%, indicating suboptimal compliance, average AHI 2 per hour, leak acceptable with the 95th percentile at 14.3 L/m, average usage of 4 hours. Pressure of 13/9 cm with a set rate of 10.    I saw him on 06/28/2015, at which time he reported doing okay, tremor perhaps worse. He noticed no significant difference after we increase the Sinemet. Constipation was under control with MiraLAX. He was not exercising very much but does enjoy working in the yard. He had no recent falls. He was not using a cane or walker. He was compliant with his BiPAP ST. I suggested we keep his medications the same but monitor his leg swelling since he was on Mirapex.    I reviewed his BiPAP compliance data from 11/28/2015 through 12/27/2015, which is a total of 30 days, during which time he used his machine every night with percent used days greater than 4 hours at 73%, indicating adequate compliance with an average usage of only 4 hours and 21 minutes. Residual AHI 2 per hour, leak on the acceptable side with the 95th percentile at 13.4 L/m on a pressure of 13/9 with a rate of 10.   12/28/2014, at which time he reported doing fairly well, no recent falls, tremor somewhat worse and dexterity worse. He had noted some trouble with his turns at times. He was on Sinemet 4 times a day, usually at 6 AM, 10 AM, 2 PM and 8 PM. He had constipation and was on a stool softener and prunes. He was to have a colonoscopy on 01/04/2015.    I reviewed his BiPAP ST compliance data from 05/29/2015 through  06/27/2015, which is a total of 30 days during which time he used his machine every night with percent used days greater than 4 hours at 97%, indicating excellent compliance with an average usage of 5 hours and 16 minutes, residual AHI 1.5 per hour, leak acceptable with the 95th percentile at 12.4 L/m on a pressure of 13/9 cm with a rate of 10.   I saw him on 07/09/2014, at whicht time he reported  feeling stable for the most part. He had not experienced any drastic changes in his symptoms. He reported no falls. He had no significant low back pain and no longer was taking any narcotics. Unfortunately, his wife was involved in a car accident and sustained broken ribs and had to be in the ICU for some time. He was using his BiPAP machine regularly. He reported one skip night because of a stomach bug. Overall, he felt he was sleeping well. He felt that the increase in Sinemet helped his trembling. He was tolerating his medication. He had no new major mood or memory issues.    I reviewed his BiPAP compliance data from 11/23/2014 through 12/22/2014 which is a total of 30 days during which time he used his machine every night with percent used days greater than 4 hours at 93%, indicating excellent compliance with an average usage of 4 hours and 54 minutes, residual AHI low at 1.7 per hour, leaked low with the 95th percentile at 7.4 L/m on a pressure of 13/9 cm with a backup rate of 10.    I saw him on 03/03/2014, at which time he reported experiencing thick mucus first thing in the morning. His tremor was worse per wife. He received new supplies recently. He has some thick mucus first thing in the morning. His tremor has become worse per wife. His back surgery in November 2015 had relieved much of his pain. He was no longer on pain medication. He was taking stool softeners for his constipation. I suggested he continue with Mirapex 3 times a day but I did ask him to increase Sinemet to 4 times a day.   I reviewed  his BiPAP compliance data from 06/07/2014 through 07/06/2014 which is a total of 30 days during which time he used his machine 29 days with percent used days greater than 4 hours at 87%, indicating very good compliance with an average usage of 5 hours and 2 minutes, residual AHI low at 1.6 per hour, leak low with the 95th percentile at 7.6 L/m on a pressure of 13/9 with a rate of 10.   I saw him on 12/02/2013, at which time he was compliant with BiPAP ST. He had spine surgery under Dr. Cyndy Freeze on 12/04/2013 which went well. He is in physical therapy. He had an L spine MRI on 11/06/13: Degenerative lumbar spondylosis with multilevel disc disease and facet disease. There is bilateral lateral recess and bilateral foraminal stenosis at L2-3 and L3-4. The most significant level however is L4-5 with there is severe spinal, bilateral lateral recess and foraminal stenosis. We talked about his fluid intake. He was still not consistent with his dose timings for his Sinemet and Mirapex.   I reviewed his compliance data from 01/27/2014 through 02/25/2014 which is a total of 30 days during which time he used his machine every night with percent used days greater than 4 hours of 87%, indicating very good compliance with an average usage of 5 hours and 6 minutes, residual AHI of 3.5 per hour and leak acceptable with the 95th percentile at 15 L/m.   I saw him on 05/27/2013, at which time he reported being compliant with his BiPAP, averaging 4-5 hours each night. He was still taking his Sinemet with his meals on most days. He felt his tremor was a little worse. His wife felt that he was otherwise stable. He denied any new cognitive issues, depression, hallucinations, anxiety, delusions. He was drinking 3 cups of coffee in  the morning and not enough water. I did not increase his medication but asked him to take his Sinemet away from his mealtimes. I considered Linzess for chronic constipation but asked him to increase his water  intake and watch constipation symptoms before we use medications.   I reviewed his compliance data from 10/31/2013 through 11/29/2013 which is a total of 30 days during which time he used his machine every night except for 1 night. Percent used days greater than 4 hours was 90% indicating excellent compliance. Residual AHI at 2.5 per hour, leaked low. Pressure at 13/9 with a rate of 10.   I saw him on 11/27/2012, at which time I did not change his medication regimen with the exception of the timing for his Sinemet and Mirapex: I advised him to take it at 7 AM, 11 AM and 4 PM. I also asked him to continue using his BiPAP regularly and take it with him on any vacation trips. He was congratulated on his compliance.   I saw him on 07/26/2012 after had his sleep study. He has been compliant on BiPAP therapy. I had increased his Sinemet to one pill 3 times a day. He was taking it with his meals and did not note any significant improvement in his symptoms, but, then again, he was taking it right after his meals. He was asking whether he should take his BiPAP machine with him to his planned trip to [place].     I first met him on 02/19/2012 after his baseline sleep study. His total AHI was 39.5 per hour based primarily on central apneas. His obstructive AHI was around 15 per hour. His oxyhemoglobin desaturation nadir was 85% and he spent 3 hours and 31 minutes below the saturation of 90% for the night. I asked him to come back for a CPAP titration study, possibly BiPAP but he wanted to hold off until his appointment in April as he was going to be out of town and he also wanted to bring his wife for discussion.   I then saw him back on 04/17/2012 and again went over his test results with him and his wife. He had been doing well from the PD standpoint. He agreed to come back for another sleep study with full night titration. His sleep titration study was on 05/14/2012 and I went over his test results with him and  his wife in detail during our visit in July. Sleep efficiency was reduced at 71.5% with a latency to sleep of 2 minutes. Wake after sleep onset was highly elevated at 124 minutes with mild to moderate sleep fragmentation noted. He had increased percentage of REM sleep at 27.2%. He had a normal REM latency. There were no significant aortic leg movements. He was started on CPAP at a pressure of 5 cm and titrated up to 9 cm of water pressure but he had significant central apneas and therefore changed to BiPAP at 11/7 and then switch to ST mode for ongoing central events. His final pressure was 13/9 with a backup rate of 10 on which she had a residual AHI of 0 per hour and supine REM sleep achieved. Oxyhemoglobin desaturation nadir on the final pressure was 90%.   He was placed on BiPAP ST at 13/9 cm with a backup rate of 10. I reviewed his compliance from 06/26/2012 through 07/25/2012 (29 days), during which time he used it every day. His average usage was 5 hours and 6 minutes and percent used days greater  than 4 hours was 96% indicating excellent compliance. His residual AHI was around 6 indicating fairly reasonable pressure settings. He reported tolerating the treatment and he changed from a FFM to a nasal mask. He denied depression, memory loss, lightheadedness, or hallucinations. I also reviewed more recent compliance data from 09/29/2012 through 10/28/2012 (30 days), during which time he used his machine every day. His average usage was 5 hours and 23 minutes, his percent used days greater than 4 hours was 29 days, which is 97%, indicating excellent compliance. His residual AHI was 2.6 per hour indicating an appropriate treatment setting of 13/9 cm with a backup rate of 10 per minute.   His Past Medical History Is Significant For: Past Medical History:  Diagnosis Date  . Arthritis   . Brain mass    right lateral frontotemporal extra-axial mass 7.1 cm with typical features of meningioma 02/28/18 (to  follow-up w/ neurosurgeon Dr. Vertell Limber)  . Cancer [facility])    h/o  skin  cancer  . Coronary artery disease    s/p DES to LAD (02/19/18)  . Ex-smoker   . GERD (gastroesophageal reflux disease)   . Hypercholesteremia   . Hypertension   . Lumbar stenosis   . OSA (obstructive sleep apnea)   . Pancreatic duct dilated   . Parkinson's disease ([place])   . S/P TAVR (transcatheter aortic valve replacement)   . Severe aortic stenosis     His Past Surgical History Is Significant For: Past Surgical History:  Procedure Laterality Date  . APPENDECTOMY    . BACK SURGERY  11/2013  . COLONOSCOPY  11/17/2008   Rourk: Sessile polyp in the cecum status post saline-assisted piecemeal polypectomy, resolution clipping and epinephrine injection therapy performed. Shallow sigmoid diverticula.. Tubular adenoma  . COLONOSCOPY  11/19/2008   Fields: Large amount of liquid blood with clots seen throughout the colon. Previous [place] resolution clip remained in place. Purple spot seen in the lateral aspect polypectomy site, 3 resolution clips placed here. 6 mm sessile ascending colon polyp seen and not manipulated.  . COLONOSCOPY  11/25/2008   Rourk: Blood-tinged colonic effluent, suspect recurrent post polypectomy bleeding status post placement of 3 clips on the polypectomy site on top of the 3 clips that already existed, one had fallen off since last procedure. One small polyp distal to the cecum not manipulated  . COLONOSCOPY  12/06/2009   Rourk: Internal hemorrhoids, scattered left-sided diverticula, cecal polyps, one snared and subsequently clipped, 2 adjacent diminutive polyps ablated. tubular adenoma  . COLONOSCOPY N/A 01/21/2015   Dr. Gala Romney: 1.5 cm carpet-type polyp just distal to the ileocecal valve removed piecemeal fashion and ablated. Small polyp in the rectum. Pathology-tubular adenomas  . COLONOSCOPY N/A 09/01/2015   Procedure: COLONOSCOPY;  Surgeon: Daneil Dolin, MD;  [facility];  Service:  Endoscopy;  Laterality: N/A;  845  . CORONARY STENT INTERVENTION N/A 02/19/2018   Procedure: CORONARY STENT INTERVENTION;  Surgeon: [name], MD;  Location: [place] [facility];  Service: Cardiovascular;  Laterality: N/A;  . EYE SURGERY     bilateral  cataracts  . INTRAVASCULAR PRESSURE WIRE/FFR STUDY N/A 02/19/2018   Procedure: INTRAVASCULAR PRESSURE WIRE/FFR STUDY;  Surgeon: [name], MD;  Location: [place] [facility];  Service: Cardiovascular;  Laterality: N/A;  . MENISCUS REPAIR     right knee  2000  . POLYPECTOMY  09/01/2015   Procedure: POLYPECTOMY;  Surgeon: Daneil Dolin, MD;  Location: [facility];  Service: Endoscopy;;  colon  .  RIGHT/LEFT HEART CATH AND CORONARY ANGIOGRAPHY N/A 02/19/2018   Procedure: RIGHT/LEFT HEART CATH AND CORONARY ANGIOGRAPHY;  Surgeon: [name], MD;  Location: [place] [facility];  Service: Cardiovascular;  Laterality: N/A;  . TEE WITHOUT CARDIOVERSION N/A 03/19/2018   Procedure: TRANSESOPHAGEAL ECHOCARDIOGRAM (TEE);  Surgeon: [name], MD;  Location: [place] [facility];  Service: Open Heart Surgery;  Laterality: N/A;  . TONSILLECTOMY Bilateral   . TRANSCATHETER AORTIC VALVE REPLACEMENT, TRANSFEMORAL N/A 03/19/2018   Procedure: TRANSCATHETER AORTIC VALVE REPLACEMENT, TRANSFEMORAL;  Surgeon: [name], MD;  Location: [place] [facility];  Service: Open Heart Surgery;  Laterality: N/A;    His Family History Is Significant For: Family History  Problem Relation Age of Onset  . Dementia Mother   . Cancer Brother   . Stroke Brother     His Social History Is Significant For: Social History   Socioeconomic History  . Marital status: Married    Spouse name: Clinical research associate  . Number of children: 4  . Years of education: 29  . Highest education level: Not on file  Occupational History  . Occupation: Retired   Scientific laboratory technician  . Financial resource strain: Not on file  . Food insecurity    Worry: Not on file    Inability: Not on file  .  Transportation needs    Medical: Not on file    Non-medical: Not on file  Tobacco Use  . Smoking status: Former Smoker    Packs/day: 1.00    Types: Cigarettes, Pipe    Start date: 01/22/1951    Quit date: 01/21/1989    Years since quitting: 29.7  . Smokeless tobacco: Never Used  Substance and Sexual Activity  . Alcohol use: No    Alcohol/week: 0.0 standard drinks  . Drug use: No  . Sexual activity: Not on file  Lifestyle  . Physical activity    Days per week: Not on file    Minutes per session: Not on file  . Stress: Not on file  Relationships  . Social Herbalist on phone: Not on file    Gets together: Not on file    Attends religious service: Not on file    Active member of club or organization: Not on file    Attends meetings of clubs or organizations: Not on file    Relationship status: Not on file  Other Topics Concern  . Not on file  Social History Narrative  . Not on file    His Allergies Are:  No Known Allergies:   His Current Medications Are:  Outpatient Encounter Medications as of 10/15/2018  Medication Sig  . amoxicillin (AMOXIL) 500 MG tablet Take 4 tablets (2,000 mg) one hour prior to all dental visits.  Marland Kitchen aspirin EC 81 MG tablet Take 1 tablet (81 mg total) by mouth daily.  Marland Kitchen atorvastatin (LIPITOR) 40 MG tablet TAKE 1 TABLET BY MOUTH  DAILY AT 6PM  . brimonidine (ALPHAGAN) 0.2 % ophthalmic solution Place 1 drop into both eyes 3 (three) times daily.   . calcium gluconate 500 MG tablet Take 500 mg by mouth daily.  . carbidopa-levodopa (SINEMET IR) 25-100 MG tablet TAKE 2 TABLETS BY MOUTH AT  6AM, 1 TAB AT 9AM, 2 TABS  AT 12PM, 1 TAB AT 3PM, 2  TABS AT 6PM, AND 1 TAB AT  9PM  . clopidogrel (PLAVIX) 75 MG tablet TAKE 1 TABLET BY MOUTH  DAILY WITH BREAKFAST  . dorzolamide-timolol (COSOPT) 22.3-6.8 MG/ML ophthalmic solution  Place 1 drop into both eyes 2 (two) times daily.   . furosemide (LASIX) 40 MG tablet Take 1 tablet (40 mg total) by mouth daily.  Marland Kitchen  glucosamine-chondroitin 500-400 MG tablet Take 1 tablet by mouth daily.   Marland Kitchen latanoprost (XALATAN) 0.005 % ophthalmic solution Place 1 drop into both eyes at bedtime.   Marland Kitchen losartan (COZAAR) 100 MG tablet Take 1 tablet (100 mg total) by mouth daily.  . metoprolol succinate (TOPROL XL) 25 MG 24 hr tablet Take 1 tablet (25 mg total) by mouth daily.  . Multiple Vitamin (MULTIVITAMIN) tablet Take 1 tablet by mouth daily.  . nitroGLYCERIN (NITROSTAT) 0.4 MG SL tablet Place 1 tablet (0.4 mg total) under the tongue every 5 (five) minutes as needed.  . pantoprazole (PROTONIX) 20 MG tablet TAKE 1 TABLET BY MOUTH  DAILY  . potassium chloride (K-DUR) 10 MEQ tablet Take 1 tablet (10 mEq total) by mouth daily.  . pramipexole (MIRAPEX) 0.5 MG tablet Take 1 tablet (0.5 mg total) by mouth 3 (three) times daily.  . vitamin C (ASCORBIC ACID) 500 MG tablet Take 500 mg by mouth daily.  Marland Kitchen amLODipine (NORVASC) 5 MG tablet Take 1 tablet (5 mg total) by mouth daily.   No facility-administered encounter medications on file as of 10/15/2018.   :  Review of Systems:  Out of a complete 14 point review of systems, all are reviewed and negative with the exception of these symptoms as listed below: Review of Systems  Neurological:       Pt presents today for follow up. Pt reports that he is trying to get 3-4 hours of bipap usage nightly.    Objective:  Neurological Exam  Physical Exam Physical Examination:   Vitals:   10/15/18 1037  BP: 140/62  Pulse: 80    General Examination: The patient is a very pleasant 85 y.o. male in no acute distress. He appears Somewhat frail and deconditioned. Well groomed.  HEENT: He has a moderate degree of nuchal rigidity with a mildto moderatelower jaw and lip tremor, fairly constant. He has a moderately masked facies with decreased eye blink rate. Hearing is mildly impaired. He is status post cataract repairs bilaterally. He has prescription eyeglasses. Speech is moderately to  severely hypophonic,with minimal dysarthria noted, no significant drooling. Pupils are reactive to light, extraocular tracking is difficult for him. He has some redness over his nose, below where the glasses had.  Could be from the facemask of his BiPAP machine. No itching reported.  Chest is clear to auscultation without wheezing or rhonchi noted.  Heart sounds are normal with astable appearing systolic murmur.  Abdomen is soft, nontender with normal bowel sounds appreciated.  There is1+edema in the distal lower extremities bilaterally.  No joint deformities are noted.   Skin is warm and dry but he does have several old appearing bruises across the dorsi of his hands,worse than before. Chronic hyperpigmentation/discoloration in the Distal lower extremities bilaterally.  Neurologically: Mental status: The patient is awake, alert and oriented in all 3 spheres. His memory, attention, language and knowledge are fairly appropriate. He has some slowness in responding. Cranial nerves are as described under HEENT exam. In addition airway exam reveals a moderately tight airway secondary to a narrow airway entry.   Motor exam: Normal bulk and strength is noted. Tone is increased with some cogwheeling noted in both upper extremities, L more than R. Tone is also increased in the left lower extremity. Tone is mildly increased on the right  side. He has a moderate constant resting tremor in the left upper extremity and a mild to moderate resting tremor in the RLE. Fine motor skills with finger taps and hand movements as well as rapid alternating pattingaremoderately to severely impaired on the left and moderately so on the right. Foot taps are moderately impaired on the right and moderately to severely so on the left. He stands up from the seated position with mild problemsand pushes himself up. Moderately stooped posture is noted. He walks with decreased arm swing bilaterally, L more than  right. He has adecreasedstride lengthandpace and turns in 3 steps. No walking aid. His balance is mildly impaired with more insecurity with turns noted today. Cerebellar testing shows no dysmetria or intention tremor.Sensory exam is intact to light touch.  Assessment and Plan:   In summary, HALTON NEAS is a very pleasant 85 year old male with an underlying medical history of hypertension, hyperlipidemia, history of cancer, ex-smoker, and complex sleep apnea on BiPAP ST, who presents for followup consultation off his left-sided predominant Parkinson's disease as well as hiscomplex sleep apnea, for which he isonBiPAPST.He has a new BiPAP machine.  He is compliant with treatment.  His compliance has been fluctuating.  While he puts his mask on practically every night, he does not always keep it on all night.  He is encouraged to try to sleep with it through the night and also put it back on once he goes to the bathroom at night.  He has had some daytime somnolence.  He has had some progression of his Parkinson's symptoms and signs.  Nevertheless, I suggested we continue with his current regimen of Sinemet 2 pills Alternating with 1 pill for total of 6 doses a day, 3 hourly schedule.   He is also on lower dose Mirapex.  We reduce it due to his sleepiness. He has had intermittent issues with constipation. He has had back pain with status post back surgeryon 12/04/2013 with good success.  He is s/p AVR in March 2020.   I have asked him to be extremely cautious with his driving.  He usually comes with his wife who had a cardiac complication recently and could not come with him.  He came with his daughter for his June visit. I suggested a follow-up in 6 months, sooner if needed.  I answered all his questions today and he was in agreement.  I spent 20 minutes in total face-to-face time with the patient, more than 50% of which was spent in counseling and coordination of care, reviewing test  results, reviewing medication and discussing or reviewing the diagnosis of PD, CSA, OSA, the prognosis and treatment options. Pertinent laboratory and imaging test results that were available during this visit with the patient were reviewed by me and considered in my medical decision making (see chart for details).

## 2018-10-15 NOTE — Patient Instructions (Signed)
Please continue to try to use your BiPAP nightly and throughout the night.  I will request a different style of full facemask for you through your DME company.  We will keep your Parkinson's medicines the same. Please follow-up in 6 months.

## 2018-10-15 NOTE — Progress Notes (Signed)
Order for mask refit sent to [facility] via community message. Confirmation received that the order transmitted was successful.  

## 2018-10-24 DIAGNOSIS — H401134 Primary open-angle glaucoma, bilateral, indeterminate stage: Secondary | ICD-10-CM | POA: Diagnosis not present

## 2018-10-25 ENCOUNTER — Other Ambulatory Visit: Payer: Self-pay | Admitting: Cardiovascular Disease

## 2018-10-25 NOTE — Telephone Encounter (Signed)
No longer needs Plavix.

## 2018-10-25 NOTE — Addendum Note (Signed)
Addended by: Levonne Hubert on: 10/25/2018 09:51 AM   Modules accepted: Orders

## 2018-11-16 DIAGNOSIS — Z23 Encounter for immunization: Secondary | ICD-10-CM | POA: Diagnosis not present

## 2018-12-03 ENCOUNTER — Other Ambulatory Visit: Payer: Self-pay | Admitting: Cardiovascular Disease

## 2018-12-03 ENCOUNTER — Ambulatory Visit (HOSPITAL_COMMUNITY): Payer: Medicare Other

## 2018-12-05 ENCOUNTER — Other Ambulatory Visit: Payer: Self-pay

## 2018-12-05 ENCOUNTER — Ambulatory Visit (HOSPITAL_COMMUNITY)
Admission: RE | Admit: 2018-12-05 | Discharge: 2018-12-05 | Disposition: A | Discharge status: Home / Self Care | Payer: Medicare Other | Source: Ambulatory Visit | Attending: Physician Assistant | Admitting: Physician Assistant

## 2018-12-05 ENCOUNTER — Other Ambulatory Visit: Payer: Self-pay | Admitting: Physician Assistant

## 2018-12-05 DIAGNOSIS — R932 Abnormal findings on diagnostic imaging of liver and biliary tract: Secondary | ICD-10-CM | POA: Insufficient documentation

## 2018-12-05 DIAGNOSIS — K8689 Other specified diseases of pancreas: Secondary | ICD-10-CM | POA: Diagnosis not present

## 2018-12-05 DIAGNOSIS — N281 Cyst of kidney, acquired: Secondary | ICD-10-CM | POA: Diagnosis not present

## 2018-12-05 LAB — POCT I-STAT CREATININE: Creatinine, Ser: 1.4 mg/dL — ABNORMAL HIGH (ref 0.61–1.24)

## 2018-12-05 MED ORDER — GADOBUTROL 1 MMOL/ML IV SOLN
10.0000 mL | Freq: Once | INTRAVENOUS | Status: AC | PRN
  Administered 2018-12-05: 10 mL via INTRAVENOUS

## 2018-12-25 ENCOUNTER — Other Ambulatory Visit: Payer: Self-pay | Admitting: Neurosurgery

## 2018-12-25 ENCOUNTER — Other Ambulatory Visit (HOSPITAL_COMMUNITY): Payer: Self-pay | Admitting: Neurosurgery

## 2018-12-25 DIAGNOSIS — D329 Benign neoplasm of meninges, unspecified: Secondary | ICD-10-CM

## 2019-01-02 ENCOUNTER — Ambulatory Visit (HOSPITAL_COMMUNITY): Admission: RE | Admit: 2019-01-02 | Payer: Medicare Other | Source: Ambulatory Visit

## 2019-01-02 ENCOUNTER — Encounter (HOSPITAL_COMMUNITY): Payer: Self-pay

## 2019-01-15 ENCOUNTER — Ambulatory Visit (HOSPITAL_COMMUNITY)
Admission: RE | Admit: 2019-01-15 | Discharge: 2019-01-15 | Disposition: A | Discharge status: Home / Self Care | Payer: Medicare Other | Source: Ambulatory Visit | Attending: Neurosurgery | Admitting: Neurosurgery

## 2019-01-15 ENCOUNTER — Other Ambulatory Visit: Payer: Self-pay

## 2019-01-15 DIAGNOSIS — D329 Benign neoplasm of meninges, unspecified: Secondary | ICD-10-CM | POA: Insufficient documentation

## 2019-01-15 MED ORDER — GADOBUTROL 1 MMOL/ML IV SOLN
9.0000 mL | Freq: Once | INTRAVENOUS | Status: AC | PRN
  Administered 2019-01-15: 9 mL via INTRAVENOUS

## 2019-01-21 ENCOUNTER — Other Ambulatory Visit: Payer: Self-pay | Admitting: Cardiovascular Disease

## 2019-01-21 ENCOUNTER — Other Ambulatory Visit: Payer: Self-pay | Admitting: Physician Assistant

## 2019-01-22 NOTE — Telephone Encounter (Signed)
This is a [place] pt.  °

## 2019-01-27 DIAGNOSIS — Z6827 Body mass index (BMI) 27.0-27.9, adult: Secondary | ICD-10-CM | POA: Diagnosis not present

## 2019-01-27 DIAGNOSIS — I10 Essential (primary) hypertension: Secondary | ICD-10-CM | POA: Diagnosis not present

## 2019-01-27 DIAGNOSIS — D329 Benign neoplasm of meninges, unspecified: Secondary | ICD-10-CM | POA: Diagnosis not present

## 2019-01-28 ENCOUNTER — Telehealth: Payer: Self-pay | Admitting: Cardiovascular Disease

## 2019-01-28 NOTE — Telephone Encounter (Signed)

## 2019-01-30 ENCOUNTER — Encounter: Payer: Self-pay | Admitting: Cardiovascular Disease

## 2019-01-30 ENCOUNTER — Telehealth (INDEPENDENT_AMBULATORY_CARE_PROVIDER_SITE_OTHER): Payer: Medicare Other | Admitting: Cardiovascular Disease

## 2019-01-30 VITALS — Ht 70.0 in | Wt 190.0 lb

## 2019-01-30 DIAGNOSIS — I714 Abdominal aortic aneurysm, without rupture, unspecified: Secondary | ICD-10-CM

## 2019-01-30 DIAGNOSIS — Z955 Presence of coronary angioplasty implant and graft: Secondary | ICD-10-CM

## 2019-01-30 DIAGNOSIS — I10 Essential (primary) hypertension: Secondary | ICD-10-CM

## 2019-01-30 DIAGNOSIS — I5032 Chronic diastolic (congestive) heart failure: Secondary | ICD-10-CM

## 2019-01-30 DIAGNOSIS — G4730 Sleep apnea, unspecified: Secondary | ICD-10-CM

## 2019-01-30 DIAGNOSIS — Z952 Presence of prosthetic heart valve: Secondary | ICD-10-CM | POA: Diagnosis not present

## 2019-01-30 DIAGNOSIS — I25118 Atherosclerotic heart disease of native coronary artery with other forms of angina pectoris: Secondary | ICD-10-CM

## 2019-01-30 NOTE — Progress Notes (Signed)
Virtual Visit via Telephone Note   This visit type was conducted due to national recommendations for restrictions regarding the COVID-19 Pandemic (e.g. social distancing) in an effort to limit this patient's exposure and mitigate transmission in our community.  Due to his co-morbid illnesses, this patient is at least at moderate risk for complications without adequate follow up.  This format is felt to be most appropriate for this patient at this time.  The patient did not have access to video technology/had technical difficulties with video requiring transitioning to audio format only (telephone).  All issues noted in this document were discussed and addressed.  No physical exam could be performed with this format.  Please refer to the patient's chart for his  consent to telehealth for [facility].   Date:  01/30/2019   ID:  [name], DOB Feb 03, 1931, MRN [id number]  Patient Location: Home Provider Location: Home  PCP:  Celene Squibb, MD  Cardiologist:  Kate Sable, MD  Electrophysiologist:  None   Evaluation Performed:  Follow-Up Visit  Chief Complaint:  CAD, TAVR  History of Present Illness:    [name] is a 86 y.o. male with a history of TAVR on 03/19/2018.  He also has an infrarenal abdominal aortic aneurysm followed by vascular surgery as well as Parkinson's and complex sleep apnea followed by neurology. He also has coronary artery disease and underwent drug-eluting stent placement to the LAD on 02/19/2018.  He had a mechanical fall with a benign brain mass found likely a meningioma on 02/28/2018.  The patient denies any symptoms of chest pain, palpitations, shortness of breath, lightheadedness, dizziness, leg swelling, orthopnea, PND, and syncope.  He also denies fevers and chills.  He wears a mask when going out.    Past Medical History:  Diagnosis Date  . Arthritis   . Brain mass    right lateral frontotemporal extra-axial mass 7.1 cm with typical  features of meningioma 02/28/18 (to follow-up w/ neurosurgeon Dr. Vertell Limber)  . Cancer [facility])    h/o  skin  cancer  . Coronary artery disease    s/p DES to LAD (02/19/18)  . Ex-smoker   . GERD (gastroesophageal reflux disease)   . Hypercholesteremia   . Hypertension   . Lumbar stenosis   . OSA (obstructive sleep apnea)   . Pancreatic duct dilated   . Parkinson's disease ([place])   . S/P TAVR (transcatheter aortic valve replacement)   . Severe aortic stenosis    Past Surgical History:  Procedure Laterality Date  . APPENDECTOMY    . BACK SURGERY  11/2013  . COLONOSCOPY  11/17/2008   Rourk: Sessile polyp in the cecum status post saline-assisted piecemeal polypectomy, resolution clipping and epinephrine injection therapy performed. Shallow sigmoid diverticula.. Tubular adenoma  . COLONOSCOPY  11/19/2008   Fields: Large amount of liquid blood with clots seen throughout the colon. Previous [place] resolution clip remained in place. Purple spot seen in the lateral aspect polypectomy site, 3 resolution clips placed here. 6 mm sessile ascending colon polyp seen and not manipulated.  . COLONOSCOPY  11/25/2008   Rourk: Blood-tinged colonic effluent, suspect recurrent post polypectomy bleeding status post placement of 3 clips on the polypectomy site on top of the 3 clips that already existed, one had fallen off since last procedure. One small polyp distal to the cecum not manipulated  . COLONOSCOPY  12/06/2009   Rourk: Internal hemorrhoids, scattered left-sided diverticula, cecal polyps, one snared and subsequently clipped, 2 adjacent diminutive polyps  ablated. tubular adenoma  . COLONOSCOPY N/A 01/21/2015   Dr. Gala Romney: 1.5 cm carpet-type polyp just distal to the ileocecal valve removed piecemeal fashion and ablated. Small polyp in the rectum. Pathology-tubular adenomas  . COLONOSCOPY N/A 09/01/2015   Procedure: COLONOSCOPY;  Surgeon: Daneil Dolin, MD;  [facility];  Service: Endoscopy;  Laterality:  N/A;  845  . CORONARY STENT INTERVENTION N/A 02/19/2018   Procedure: CORONARY STENT INTERVENTION;  Surgeon: [name], MD;  Location: [place] CV LAB;  Service: Cardiovascular;  Laterality: N/A;  . EYE SURGERY     bilateral  cataracts  . INTRAVASCULAR PRESSURE WIRE/FFR STUDY N/A 02/19/2018   Procedure: INTRAVASCULAR PRESSURE WIRE/FFR STUDY;  Surgeon: [name], MD;  Location: [place] CV LAB;  Service: Cardiovascular;  Laterality: N/A;  . MENISCUS REPAIR     right knee  2000  . POLYPECTOMY  09/01/2015   Procedure: POLYPECTOMY;  Surgeon: Daneil Dolin, MD;  [facility];  Service: Endoscopy;;  colon  . RIGHT/LEFT HEART CATH AND CORONARY ANGIOGRAPHY N/A 02/19/2018   Procedure: RIGHT/LEFT HEART CATH AND CORONARY ANGIOGRAPHY;  Surgeon: [name], MD;  Location: [place] CV LAB;  Service: Cardiovascular;  Laterality: N/A;  . TEE WITHOUT CARDIOVERSION N/A 03/19/2018   Procedure: TRANSESOPHAGEAL ECHOCARDIOGRAM (TEE);  Surgeon: [name], MD;  Location: [place] CV LAB;  Service: Open Heart Surgery;  Laterality: N/A;  . TONSILLECTOMY Bilateral   . TRANSCATHETER AORTIC VALVE REPLACEMENT, TRANSFEMORAL N/A 03/19/2018   Procedure: TRANSCATHETER AORTIC VALVE REPLACEMENT, TRANSFEMORAL;  Surgeon: [name], MD;  Location: [place] CV LAB;  Service: Open Heart Surgery;  Laterality: N/A;     Current Meds  Medication Sig  . amLODipine (NORVASC) 5 MG tablet TAKE 1 TABLET BY MOUTH  DAILY  . amoxicillin (AMOXIL) 500 MG tablet Take 4 tablets (2,000 mg) one hour prior to all dental visits.  Marland Kitchen aspirin EC 81 MG tablet Take 1 tablet (81 mg total) by mouth daily.  Marland Kitchen atorvastatin (LIPITOR) 40 MG tablet TAKE 1 TABLET BY MOUTH  DAILY AT 6PM  . brimonidine (ALPHAGAN) 0.2 % ophthalmic solution Place 1 drop into both eyes 3 (three) times daily.   . calcium gluconate 500 MG tablet Take 500 mg by mouth daily.  . carbidopa-levodopa (SINEMET IR) 25-100 MG tablet TAKE 2 TABLETS BY  MOUTH AT  6AM, 1 TAB AT 9AM, 2 TABS  AT 12PM, 1 TAB AT 3PM, 2  TABS AT 6PM, AND 1 TAB AT  9PM  . dorzolamide-timolol (COSOPT) 22.3-6.8 MG/ML ophthalmic solution Place 1 drop into both eyes 2 (two) times daily.   . furosemide (LASIX) 40 MG tablet TAKE 1 TABLET BY MOUTH  DAILY  . glucosamine-chondroitin 500-400 MG tablet Take 1 tablet by mouth daily.   Marland Kitchen latanoprost (XALATAN) 0.005 % ophthalmic solution Place 1 drop into both eyes at bedtime.   Marland Kitchen losartan (COZAAR) 100 MG tablet Take 1 tablet (100 mg total) by mouth daily.  . metoprolol succinate (TOPROL-XL) 25 MG 24 hr tablet TAKE 1 TABLET BY MOUTH  DAILY  . Multiple Vitamin (MULTIVITAMIN) tablet Take 1 tablet by mouth daily.  . nitroGLYCERIN (NITROSTAT) 0.4 MG SL tablet Place 1 tablet (0.4 mg total) under the tongue every 5 (five) minutes as needed.  . pantoprazole (PROTONIX) 20 MG tablet TAKE 1 TABLET BY MOUTH  DAILY  . potassium chloride (KLOR-CON) 10 MEQ tablet TAKE 1 TABLET BY MOUTH  DAILY  . pramipexole (MIRAPEX) 0.5 MG tablet Take 1 tablet (0.5  mg total) by mouth 3 (three) times daily.  . vitamin C (ASCORBIC ACID) 500 MG tablet Take 500 mg by mouth daily.  . [DISCONTINUED] potassium chloride (K-DUR) 10 MEQ tablet Take 1 tablet (10 mEq total) by mouth daily.     Allergies:   Patient has no known allergies.   Social History   Tobacco Use  . Smoking status: Former Smoker    Packs/day: 1.00    Types: Cigarettes, Pipe    Start date: 01/22/1951    Quit date: 01/21/1989    Years since quitting: 30.0  . Smokeless tobacco: Never Used  Substance Use Topics  . Alcohol use: No    Alcohol/week: 0.0 standard drinks  . Drug use: No     Family Hx: The patient's family history includes Cancer in his brother; Dementia in his mother; Stroke in his brother.  ROS:   Please see the history of present illness.     All other systems reviewed and are negative.   Prior CV studies:   The following studies were reviewed today:  Echocardiogram  05/28/2018:  1. The left ventricle has normal systolic function with an ejection fraction of 60-65%. The cavity size was normal. There is mildly increased left ventricular wall thickness. Left ventricular diastolic Doppler parameters are consistent with impaired  relaxation. No evidence of left ventricular regional wall motion abnormalities. 2. The right ventricle has normal systolic function. The cavity was normal. There is no increase in right ventricular wall thickness. Right ventricular systolic pressure normal with an estimated pressure of 26.2 mmHg. 3. Status post 29 mm Edwards Sapien 3 THV. Normal valve gradients. There is a trace degree of perivalvular regurgitation noted in long axis views at the base of the septum, not wll seen in short axis views. 4. Left atrial size was moderately dilated. 5. The mitral valve is degenerative. Mild thickening of the mitral valve leaflet. Mild calcification of the mitral valve leaflet. There is mild mitral annular calcification present. 6. The tricuspid valve is grossly normal. 7. The aortic root is normal in size and structure. 8. The inferior vena cava was dilated in size with >50% respiratory variability. 9. Cannot exclude very small interatrial left to right shunt.   Cardiac catheterization 02/19/2018:   1st Diag lesion is 50% stenosed.  Ost 1st Mrg to 1st Mrg lesion is 30% stenosed.  1. Severe RCA stenosis with subtotal occlusion of the distal RCA and collaterals from the left coronary artery supplying the PDA and PL branches 2. Severe mid LAD stenosis with abnormal pressure wire analysis, treated successfully with PCI using a drug-eluting stent 3. Patent left mainstem and left circumflex 4. Severe aortic stenosis with mean transvalvular gradient 30 mmHg and peak gradient 42 mmHg  Recommendations: Dual antiplatelet therapy with aspirin and clopidogrel for at least 6 months. Continue TAVR evaluation. Medical therapy for  residual CAD.  Labs/Other Tests and Data Reviewed:    EKG:  No ECG reviewed.  Recent Labs: 03/18/2018: ALT <5; B Natriuretic Peptide 82.8 03/20/2018: Hemoglobin 13.7; Magnesium 1.9; Platelets 126 03/28/2018: BUN 30; NT-Pro BNP 519; Potassium 4.1; Sodium 141 12/05/2018: Creatinine, Ser 1.40   Recent Lipid Panel Lab Results  Component Value Date/Time   CHOL 144 02/20/2018 07:14 AM   TRIG 68 02/20/2018 07:14 AM   HDL 37 (L) 02/20/2018 07:14 AM   CHOLHDL 3.9 02/20/2018 07:14 AM   LDLCALC 93 02/20/2018 07:14 AM    Wt Readings from Last 3 Encounters:  01/30/19 190 lb (86.2 kg)  10/15/18  192 lb (87.1 kg)  07/31/18 197 lb (89.4 kg)     Objective:    Vital Signs:  Ht 5\' 10"  (1.778 m)   Wt 190 lb (86.2 kg)   BMI 27.26 kg/m    VITAL SIGNS:  reviewed  ASSESSMENT & PLAN:    1.  Severe aortic stenosis status post TAVR: Symptomatically stable and doing well. NYHA class I symptoms.  He will need SBE prophylaxis for dental procedures. Aspirin will be continued indefinitely.  2.  Coronary artery disease: Status post drug-eluting stent placement to the LAD on 02/19/2018.  Symptomatically stable.  Continue aspirin, atorvastatin, and Toprol-XL.  3.  Abdominal aortic aneurysm: Followed by vascular surgery.  4.9 cm.  4.  Hypertension: No changes.  5.  Complex sleep apnea: Followed by neurology in [place] (Dr. Rexene Alberts).  6.  Chronic diastolic heart failure: Euvolemic on Lasix 40 mg daily.  He take supplemental potassium as well.  COVID-19 Education: The signs and symptoms of COVID-19 were discussed with the patient and how to seek care for testing (follow up with PCP or arrange E-visit).  The importance of social distancing was discussed today.  Time:   Today, I have spent 5 minutes with the patient with telehealth technology discussing the above problems.     Medication Adjustments/Labs and Tests Ordered: Current medicines are reviewed at length with the patient today.  Concerns  regarding medicines are outlined above.   Tests Ordered: No orders of the defined types were placed in this encounter.   Medication Changes: No orders of the defined types were placed in this encounter.   Follow Up:  Virtual Visit  in 1 year(s)  Signed, Kate Sable, MD  01/30/2019 8:51 AM    [place]

## 2019-01-30 NOTE — Patient Instructions (Signed)

## 2019-02-12 DIAGNOSIS — D3612 Benign neoplasm of peripheral nerves and autonomic nervous system, upper limb, including shoulder: Secondary | ICD-10-CM | POA: Diagnosis not present

## 2019-02-12 DIAGNOSIS — D485 Neoplasm of uncertain behavior of skin: Secondary | ICD-10-CM | POA: Diagnosis not present

## 2019-02-12 DIAGNOSIS — L821 Other seborrheic keratosis: Secondary | ICD-10-CM | POA: Diagnosis not present

## 2019-02-12 DIAGNOSIS — Z85828 Personal history of other malignant neoplasm of skin: Secondary | ICD-10-CM | POA: Diagnosis not present

## 2019-02-12 DIAGNOSIS — L570 Actinic keratosis: Secondary | ICD-10-CM | POA: Diagnosis not present

## 2019-02-12 DIAGNOSIS — L853 Xerosis cutis: Secondary | ICD-10-CM | POA: Diagnosis not present

## 2019-02-12 DIAGNOSIS — C44619 Basal cell carcinoma of skin of left upper limb, including shoulder: Secondary | ICD-10-CM | POA: Diagnosis not present

## 2019-02-12 DIAGNOSIS — D1801 Hemangioma of skin and subcutaneous tissue: Secondary | ICD-10-CM | POA: Diagnosis not present

## 2019-02-12 DIAGNOSIS — C44519 Basal cell carcinoma of skin of other part of trunk: Secondary | ICD-10-CM | POA: Diagnosis not present

## 2019-02-27 ENCOUNTER — Other Ambulatory Visit: Payer: Self-pay | Admitting: Cardiovascular Disease

## 2019-03-10 ENCOUNTER — Ambulatory Visit: Payer: Medicare Other

## 2019-03-14 ENCOUNTER — Ambulatory Visit: Payer: Medicare Other | Attending: Internal Medicine

## 2019-03-14 DIAGNOSIS — Z23 Encounter for immunization: Secondary | ICD-10-CM

## 2019-03-14 NOTE — Progress Notes (Signed)
   Covid-19 Vaccination Clinic  Name:  LEANDRE HESSMAN    MRN: [id number] DOB: 10-30-1931  03/14/2019  Mr. Schudel was observed post Covid-19 immunization for 15 minutes without incidence. He was provided with Vaccine Information Sheet and instruction to access the V-Safe system.   Mr. Rycroft was instructed to call 911 with any severe reactions post vaccine: Marland Kitchen Difficulty breathing  . Swelling of your face and throat  . A fast heartbeat  . A bad rash all over your body  . Dizziness and weakness    Immunizations Administered    Name Date Dose VIS Date Route   Pfizer COVID-19 Vaccine 03/14/2019  9:05 AM 0.3 mL 12/27/2018 Intramuscular   Manufacturer: [place]   Lot: [id number]   [place]: [id number]

## 2019-03-19 ENCOUNTER — Other Ambulatory Visit: Payer: Self-pay

## 2019-03-19 ENCOUNTER — Ambulatory Visit (HOSPITAL_COMMUNITY): Payer: Medicare Other | Attending: Physician Assistant

## 2019-03-19 ENCOUNTER — Ambulatory Visit (INDEPENDENT_AMBULATORY_CARE_PROVIDER_SITE_OTHER): Payer: Medicare Other | Admitting: Physician Assistant

## 2019-03-19 VITALS — BP 124/70 | HR 80 | Ht 71.0 in | Wt 187.0 lb

## 2019-03-19 DIAGNOSIS — I714 Abdominal aortic aneurysm, without rupture, unspecified: Secondary | ICD-10-CM

## 2019-03-19 DIAGNOSIS — I350 Nonrheumatic aortic (valve) stenosis: Secondary | ICD-10-CM | POA: Diagnosis not present

## 2019-03-19 DIAGNOSIS — I25118 Atherosclerotic heart disease of native coronary artery with other forms of angina pectoris: Secondary | ICD-10-CM

## 2019-03-19 DIAGNOSIS — I5032 Chronic diastolic (congestive) heart failure: Secondary | ICD-10-CM | POA: Diagnosis not present

## 2019-03-19 DIAGNOSIS — I10 Essential (primary) hypertension: Secondary | ICD-10-CM

## 2019-03-19 DIAGNOSIS — G20 Parkinson's disease: Secondary | ICD-10-CM | POA: Diagnosis not present

## 2019-03-19 DIAGNOSIS — I951 Orthostatic hypotension: Secondary | ICD-10-CM | POA: Insufficient documentation

## 2019-03-19 DIAGNOSIS — Z952 Presence of prosthetic heart valve: Secondary | ICD-10-CM | POA: Diagnosis not present

## 2019-03-19 DIAGNOSIS — G4733 Obstructive sleep apnea (adult) (pediatric): Secondary | ICD-10-CM | POA: Diagnosis not present

## 2019-03-19 DIAGNOSIS — I119 Hypertensive heart disease without heart failure: Secondary | ICD-10-CM | POA: Insufficient documentation

## 2019-03-19 DIAGNOSIS — E785 Hyperlipidemia, unspecified: Secondary | ICD-10-CM | POA: Diagnosis not present

## 2019-03-19 MED ORDER — FUROSEMIDE 40 MG PO TABS: 40.0000 mg | ORAL_TABLET | Freq: Every day | ORAL | 3 refills | Status: DC | PRN

## 2019-03-19 MED ORDER — POTASSIUM CHLORIDE CRYS ER 10 MEQ PO TBCR: 10.0000 meq | EXTENDED_RELEASE_TABLET | Freq: Every day | ORAL | 3 refills | Status: DC | PRN
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
## 2019-03-19 NOTE — Patient Instructions (Signed)
Medication Instructions:  1) Aspirin 81 mg has been added to your med list 2) you may DECREASE LASIX to 40 mg once daily AS NEEDED 3) you may DECREASE POTASSIUM to 10 meq once daily only when you take Lasix *If you need a refill on your cardiac medications before your next appointment, please call your pharmacy*   Follow-Up: At [facility], you and your health needs are our priority.  As part of our continuing mission to provide you with exceptional heart care, we have created designated Provider Care Teams.  These Care Teams include your primary Cardiologist (physician) and Advanced Practice Providers (APPs -  Physician Assistants and Nurse Practitioners) who all work together to provide you with the care you need, when you need it. Your next appointment:   10 month(s) The format for your next appointment:   In Person Provider:   You may see Kate Sable, MD or one of the following Advanced Practice Providers on your designated Care Team:    [place], PA-C   Ermalinda Barrios, [place]

## 2019-04-07 ENCOUNTER — Emergency Department (HOSPITAL_COMMUNITY): Payer: Medicare Other

## 2019-04-07 ENCOUNTER — Other Ambulatory Visit: Payer: Self-pay

## 2019-04-07 ENCOUNTER — Emergency Department (HOSPITAL_COMMUNITY)
Admission: EM | Admit: 2019-04-07 | Discharge: 2019-04-07 | Disposition: A | Discharge status: Home / Self Care | Payer: Medicare Other | Attending: Emergency Medicine | Admitting: Emergency Medicine

## 2019-04-07 ENCOUNTER — Encounter (HOSPITAL_COMMUNITY): Payer: Self-pay

## 2019-04-07 DIAGNOSIS — Z85828 Personal history of other malignant neoplasm of skin: Secondary | ICD-10-CM | POA: Insufficient documentation

## 2019-04-07 DIAGNOSIS — G20 Parkinson's disease: Secondary | ICD-10-CM | POA: Diagnosis not present

## 2019-04-07 DIAGNOSIS — Y92002 Bathroom of unspecified non-institutional (private) residence single-family (private) house as the place of occurrence of the external cause: Secondary | ICD-10-CM | POA: Insufficient documentation

## 2019-04-07 DIAGNOSIS — S20212A Contusion of left front wall of thorax, initial encounter: Secondary | ICD-10-CM | POA: Diagnosis not present

## 2019-04-07 DIAGNOSIS — Y939 Activity, unspecified: Secondary | ICD-10-CM | POA: Insufficient documentation

## 2019-04-07 DIAGNOSIS — S20222A Contusion of left back wall of thorax, initial encounter: Secondary | ICD-10-CM | POA: Diagnosis not present

## 2019-04-07 DIAGNOSIS — Y999 Unspecified external cause status: Secondary | ICD-10-CM | POA: Diagnosis not present

## 2019-04-07 DIAGNOSIS — Z952 Presence of prosthetic heart valve: Secondary | ICD-10-CM | POA: Insufficient documentation

## 2019-04-07 DIAGNOSIS — Z79899 Other long term (current) drug therapy: Secondary | ICD-10-CM | POA: Diagnosis not present

## 2019-04-07 DIAGNOSIS — W182XXA Fall in (into) shower or empty bathtub, initial encounter: Secondary | ICD-10-CM | POA: Diagnosis not present

## 2019-04-07 DIAGNOSIS — Z7982 Long term (current) use of aspirin: Secondary | ICD-10-CM | POA: Insufficient documentation

## 2019-04-07 DIAGNOSIS — W19XXXA Unspecified fall, initial encounter: Secondary | ICD-10-CM

## 2019-04-07 DIAGNOSIS — Z87891 Personal history of nicotine dependence: Secondary | ICD-10-CM | POA: Insufficient documentation

## 2019-04-07 DIAGNOSIS — R531 Weakness: Secondary | ICD-10-CM | POA: Diagnosis not present

## 2019-04-07 DIAGNOSIS — Z03818 Encounter for observation for suspected exposure to other biological agents ruled out: Secondary | ICD-10-CM | POA: Diagnosis not present

## 2019-04-07 DIAGNOSIS — Z20822 Contact with and (suspected) exposure to covid-19: Secondary | ICD-10-CM | POA: Diagnosis not present

## 2019-04-07 DIAGNOSIS — I2510 Atherosclerotic heart disease of native coronary artery without angina pectoris: Secondary | ICD-10-CM | POA: Diagnosis not present

## 2019-04-07 DIAGNOSIS — S299XXA Unspecified injury of thorax, initial encounter: Secondary | ICD-10-CM | POA: Diagnosis present

## 2019-04-07 DIAGNOSIS — I10 Essential (primary) hypertension: Secondary | ICD-10-CM | POA: Insufficient documentation

## 2019-04-07 LAB — URINALYSIS, ROUTINE W REFLEX MICROSCOPIC
Bacteria, UA: NONE SEEN
Bilirubin Urine: NEGATIVE
Glucose, UA: NEGATIVE mg/dL
Hgb urine dipstick: NEGATIVE
Ketones, ur: 5 mg/dL — AB
Leukocytes,Ua: NEGATIVE
Nitrite: NEGATIVE
Protein, ur: 30 mg/dL — AB
Specific Gravity, Urine: 1.02 (ref 1.005–1.030)
pH: 5 (ref 5.0–8.0)

## 2019-04-07 LAB — COMPREHENSIVE METABOLIC PANEL
ALT: 6 U/L (ref 0–44)
AST: 36 U/L (ref 15–41)
Albumin: 3.6 g/dL (ref 3.5–5.0)
Alkaline Phosphatase: 84 U/L (ref 38–126)
Anion gap: 7 (ref 5–15)
BUN: 20 mg/dL (ref 8–23)
CO2: 26 mmol/L (ref 22–32)
Calcium: 9.1 mg/dL (ref 8.9–10.3)
Chloride: 107 mmol/L (ref 98–111)
Creatinine, Ser: 1.02 mg/dL (ref 0.61–1.24)
GFR calc Af Amer: >60 mL/min (ref >60)
GFR calc non Af Amer: >60 mL/min (ref >60)
Glucose, Bld: 103 mg/dL — ABNORMAL HIGH (ref 70–99)
Potassium: 4.1 mmol/L (ref 3.5–5.1)
Sodium: 140 mmol/L (ref 135–145)
Total Bilirubin: 1.2 mg/dL (ref 0.3–1.2)
Total Protein: 6.5 g/dL (ref 6.5–8.1)

## 2019-04-07 LAB — CBC WITH DIFFERENTIAL/PLATELET
Abs Immature Granulocytes: 0.04 10*3/uL (ref 0.00–0.07)
Basophils Absolute: 0 10*3/uL (ref 0.0–0.1)
Basophils Relative: 0 %
Eosinophils Absolute: 0 10*3/uL (ref 0.0–0.5)
Eosinophils Relative: 0 %
HCT: 41.1 % (ref 39.0–52.0)
Hemoglobin: 14.3 g/dL (ref 13.0–17.0)
Immature Granulocytes: 0 %
Lymphocytes Relative: 17 %
Lymphs Abs: 1.8 10*3/uL (ref 0.7–4.0)
MCH: 35.8 pg — ABNORMAL HIGH (ref 26.0–34.0)
MCHC: 34.8 g/dL (ref 30.0–36.0)
MCV: 102.8 fL — ABNORMAL HIGH (ref 80.0–100.0)
Monocytes Absolute: 0.9 10*3/uL (ref 0.1–1.0)
Monocytes Relative: 9 %
Neutro Abs: 7.7 10*3/uL (ref 1.7–7.7)
Neutrophils Relative %: 74 %
Platelets: 130 10*3/uL — ABNORMAL LOW (ref 150–400)
RBC: 4 MIL/uL — ABNORMAL LOW (ref 4.22–5.81)
RDW: 14 % (ref 11.5–15.5)
WBC: 10.5 10*3/uL (ref 4.0–10.5)
nRBC: 0 % (ref 0.0–0.2)

## 2019-04-07 LAB — SARS CORONAVIRUS 2 (TAT 6-24 HRS): SARS Coronavirus 2: NEGATIVE

## 2019-04-07 MED ORDER — BACITRACIN-NEOMYCIN-POLYMYXIN 400-5-5000 EX OINT
TOPICAL_OINTMENT | Freq: Once | CUTANEOUS | Status: DC
  Filled 2019-04-07: qty 1

## 2019-04-07 MED ORDER — SODIUM CHLORIDE 0.9 % IV BOLUS
1000.0000 mL | Freq: Once | INTRAVENOUS | Status: AC
  Administered 2019-04-07: 1000 mL via INTRAVENOUS

## 2019-04-07 NOTE — Progress Notes (Signed)
CSW in contact with patients son [name] [facility]: [id number] to discuss continuity of care for patient. CSW inquires about the supports that patient currently has within the home. [name] explains that patient has a Systems analyst aid that comes into the home Monday-Saturday 8am-12pm to assist the patient with his needs  [name] goes into detail and states that as of recent, patient has had increased weakness and difficulty doing things such as walking and standing. [name] reports that much of this can be attributed to his parkinsons disease. Patient has an appointment with his neurologist this upcoming Monday.   CSW will discuss with treatment team if patient could benefit from home health care services being that Parkinson's disease is likely the cause of patients increase in weakness.  TOC team will continue to follow patient for discharge related needs  [facility]  Clinical Social Worker  Ph: [phone]

## 2019-04-07 NOTE — Discharge Instructions (Signed)
Test showed no life-threatening condition.  Increase fluids.  Eat regular meals.  Follow-up with your primary care doctor.

## 2019-04-07 NOTE — ED Triage Notes (Signed)
Pt to er, daughter with pt, states that pt lives alone, states that family is involved and son lives next door and brings dinner, caregivers come throughout the day.  States that over the past three days pt has been getting progressively weak.  States that he has decreased urinary output and fell last night in the tub and couldn't get up.  States that they found him this am.  Pt c/o back pain, arm pain.

## 2019-04-07 NOTE — ED Provider Notes (Addendum)
[facility] Provider Note   CSN: [id number] Arrival date & time: 04/07/19  1339     History Chief Complaint  Patient presents with  . Fall  . Weakness    [name] is a 86 y.o. male.  Level 5 caveat for acuity of condition.  Patient apparently fell in the bathtub at approximately midnight last night and was unable to get up.  He was found by his son today.  Daughter says he has not felt well lately.  Urine is malodorous.  Complains of bruise to left mid back.  Status post aortic valve replacement last year.  Recent discontinuation of potassium and diuretic.  Has Parkinson's disease.        Past Medical History:  Diagnosis Date  . Arthritis   . Brain mass    right lateral frontotemporal extra-axial mass 7.1 cm with typical features of meningioma 02/28/18 (to follow-up w/ neurosurgeon Dr. Vertell Limber)  . Cancer [facility])    h/o  skin  cancer  . Coronary artery disease    s/p DES to LAD (02/19/18)  . Ex-smoker   . GERD (gastroesophageal reflux disease)   . Hypercholesteremia   . Hypertension   . Lumbar stenosis   . OSA (obstructive sleep apnea)   . Pancreatic duct dilated   . Parkinson's disease ([place])   . S/P TAVR (transcatheter aortic valve replacement)   . Severe aortic stenosis     Patient Active Problem List   Diagnosis Date Noted  . Acute on chronic diastolic heart failure ([place]) 03/20/2018  . S/P TAVR (transcatheter aortic valve replacement) 03/19/2018  . Coronary artery disease   . Pancreatic duct dilated   . Severe aortic stenosis 02/19/2018  . OSA (obstructive sleep apnea)   . History of colonic polyps   . Hx of adenomatous colonic polyps 01/04/2015  . Lumbar canal stenosis 12/04/2013  . Primary central sleep apnea 04/16/2012  . Orthostatic hypotension 04/16/2012  . Hereditary and idiopathic peripheral neuropathy 04/16/2012  . Parkinson's 04/16/2012  . COLONIC POLYPS, ADENOMATOUS 11/19/2008  . GERD 11/19/2008  . GI BLEEDING 11/19/2008   . HLD (hyperlipidemia) 07/03/2008  . Essential hypertension 07/03/2008    Past Surgical History:  Procedure Laterality Date  . APPENDECTOMY    . BACK SURGERY  11/2013  . COLONOSCOPY  11/17/2008   Rourk: Sessile polyp in the cecum status post saline-assisted piecemeal polypectomy, resolution clipping and epinephrine injection therapy performed. Shallow sigmoid diverticula.. Tubular adenoma  . COLONOSCOPY  11/19/2008   Fields: Large amount of liquid blood with clots seen throughout the colon. Previous [place] resolution clip remained in place. Purple spot seen in the lateral aspect polypectomy site, 3 resolution clips placed here. 6 mm sessile ascending colon polyp seen and not manipulated.  . COLONOSCOPY  11/25/2008   Rourk: Blood-tinged colonic effluent, suspect recurrent post polypectomy bleeding status post placement of 3 clips on the polypectomy site on top of the 3 clips that already existed, one had fallen off since last procedure. One small polyp distal to the cecum not manipulated  . COLONOSCOPY  12/06/2009   Rourk: Internal hemorrhoids, scattered left-sided diverticula, cecal polyps, one snared and subsequently clipped, 2 adjacent diminutive polyps ablated. tubular adenoma  . COLONOSCOPY N/A 01/21/2015   Dr. Gala Romney: 1.5 cm carpet-type polyp just distal to the ileocecal valve removed piecemeal fashion and ablated. Small polyp in the rectum. Pathology-tubular adenomas  . COLONOSCOPY N/A 09/01/2015   Procedure: COLONOSCOPY;  Surgeon: Daneil Dolin, MD;  [facility];  Service: Endoscopy;  Laterality: N/A;  845  . CORONARY STENT INTERVENTION N/A 02/19/2018   Procedure: CORONARY STENT INTERVENTION;  Surgeon: [name], MD;  Location: [place] [facility] LAB;  Service: Cardiovascular;  Laterality: N/A;  . EYE SURGERY     bilateral  cataracts  . INTRAVASCULAR PRESSURE WIRE/FFR STUDY N/A 02/19/2018   Procedure: INTRAVASCULAR PRESSURE WIRE/FFR STUDY;  Surgeon: [name], MD;   Location: [place] [facility];  Service: Cardiovascular;  Laterality: N/A;  . MENISCUS REPAIR     right knee  2000  . POLYPECTOMY  09/01/2015   Procedure: POLYPECTOMY;  Surgeon: Daneil Dolin, MD;  Location[facility];  Service: Endoscopy;;  colon  . RIGHT/LEFT HEART CATH AND CORONARY ANGIOGRAPHY N/A 02/19/2018   Procedure: RIGHT/LEFT HEART CATH AND CORONARY ANGIOGRAPHY;  Surgeon: [name], MD;  Location: [place] [facility];  Service: Cardiovascular;  Laterality: N/A;  . TEE WITHOUT CARDIOVERSION N/A 03/19/2018   Procedure: TRANSESOPHAGEAL ECHOCARDIOGRAM (TEE);  Surgeon: [name], MD;  Location: [place] [facility];  Service: Open Heart Surgery;  Laterality: N/A;  . TONSILLECTOMY Bilateral   . TRANSCATHETER AORTIC VALVE REPLACEMENT, TRANSFEMORAL N/A 03/19/2018   Procedure: TRANSCATHETER AORTIC VALVE REPLACEMENT, TRANSFEMORAL;  Surgeon: [name], MD;  Location: [place] [facility];  Service: Open Heart Surgery;  Laterality: N/A;       Family History  Problem Relation Age of Onset  . Dementia Mother   . Cancer Brother   . Stroke Brother     Social History   Tobacco Use  . Smoking status: Former Smoker    Packs/day: 1.00    Types: Cigarettes, Pipe    Start date: 01/22/1951    Quit date: 01/21/1989    Years since quitting: 30.2  . Smokeless tobacco: Never Used  Substance Use Topics  . Alcohol use: No    Alcohol/week: 0.0 standard drinks  . Drug use: No    Home Medications Prior to Admission medications   Medication Sig Start Date End Date Taking? Authorizing Provider  amLODipine (NORVASC) 5 MG tablet TAKE 1 TABLET BY MOUTH  DAILY 01/22/19  Yes Herminio Commons, MD  aspirin 81 MG chewable tablet Chew 81 mg by mouth daily.   Yes [provider]  atorvastatin (LIPITOR) 40 MG tablet TAKE 1 TABLET BY MOUTH  DAILY AT 6PM Patient taking differently: Take 40 mg by mouth daily at 6 PM.  01/21/19  Yes Herminio Commons, MD  brimonidine (ALPHAGAN) 0.2 %  ophthalmic solution Place 1 drop into both eyes 3 (three) times daily.  12/04/17  Yes [provider]  calcium gluconate 500 MG tablet Take 500 mg by mouth daily.   Yes [provider]  carbidopa-levodopa (SINEMET IR) 25-100 MG tablet TAKE 2 TABLETS BY MOUTH AT  6AM, 1 TAB AT 9AM, 2 TABS  AT 12PM, 1 TAB AT 3PM, 2  TABS AT 6PM, AND 1 TAB AT  9PM 09/09/18  Yes Star Age, MD  dorzolamide-timolol (COSOPT) 22.3-6.8 MG/ML ophthalmic solution Place 1 drop into both eyes 2 (two) times daily.  12/04/17  Yes [provider]  furosemide (LASIX) 40 MG tablet Take 1 tablet (40 mg total) by mouth daily as needed. 03/19/19  Yes Eileen Stanford, PA-C  glucosamine-chondroitin 500-400 MG tablet Take 1 tablet by mouth daily.    Yes [provider]  latanoprost (XALATAN) 0.005 % ophthalmic solution Place 1 drop into both eyes at bedtime.  01/05/18  Yes [provider]  losartan (COZAAR) 100 MG tablet Take 1 tablet (100 mg total) by mouth daily. 04/11/18 04/11/19 Yes Eileen Stanford, PA-C  metoprolol succinate (TOPROL-XL) 25 MG 24 hr tablet TAKE 1 TABLET BY MOUTH  DAILY 01/22/19  Yes Herminio Commons, MD  Multiple Vitamin (MULTIVITAMIN) tablet Take 1 tablet by mouth daily.   Yes [provider]  nitroGLYCERIN (NITROSTAT) 0.4 MG SL tablet Place 1 tablet (0.4 mg total) under the tongue every 5 (five) minutes as needed. 02/20/18  Yes Bhagat, Bhavinkumar, PA  pantoprazole (PROTONIX) 20 MG tablet TAKE 1 TABLET BY MOUTH  DAILY 02/27/19  Yes Herminio Commons, MD  potassium chloride (KLOR-CON) 10 MEQ tablet Take 1 tablet (10 mEq total) by mouth daily as needed. Take only when you take your Lasix. 03/19/19  Yes Eileen Stanford, PA-C  pramipexole (MIRAPEX) 0.5 MG tablet Take 1 tablet (0.5 mg total) by mouth 3 (three) times daily. 08/20/17  Yes Star Age, MD  vitamin C (ASCORBIC ACID) 500 MG tablet Take 500 mg by mouth daily.   Yes [provider]    amoxicillin (AMOXIL) 500 MG tablet Take 4 tablets (2,000 mg) one hour prior to all dental visits. 03/28/18   Eileen Stanford, PA-C    Allergies    Patient has no known allergies.  Review of Systems   Review of Systems  Unable to perform ROS: Acuity of condition    Physical Exam Updated Vital Signs BP (!) 153/67 (BP Location: Right Arm)   Pulse 95   Temp (!) 97.5 F (36.4 C) (Oral)   Resp 18   Ht 5\' 10"  (1.778 m)   Wt 85 kg   SpO2 95%   BMI 26.90 kg/m   Physical Exam Vitals and nursing note reviewed.  Constitutional:      Appearance: He is well-developed.     Comments: Bradykinesia; answers questions appropriately  HENT:     Head: Normocephalic and atraumatic.  Eyes:     Conjunctiva/sclera: Conjunctivae normal.  Cardiovascular:     Rate and Rhythm: Normal rate and regular rhythm.  Pulmonary:     Effort: Pulmonary effort is normal.     Breath sounds: Normal breath sounds.  Abdominal:     General: Bowel sounds are normal.     Palpations: Abdomen is soft.  Musculoskeletal:     Cervical back: Neck supple.     Comments: Large area of ecchymosis left posterior back  Skin:    General: Skin is warm and dry.  Neurological:     General: No focal deficit present.     Mental Status: He is alert.     Comments: Will answer questions appropriately  Psychiatric:        Behavior: Behavior normal.     ED Results / Procedures / Treatments   Labs (all labs ordered are listed, but only abnormal results are displayed) Labs Reviewed  CBC WITH DIFFERENTIAL/PLATELET - Abnormal; Notable for the following components:      Result Value   RBC 4.00 (*)    MCV 102.8 (*)    MCH 35.8 (*)    Platelets 130 (*)    All other components within normal limits  COMPREHENSIVE METABOLIC PANEL - Abnormal; Notable for the following components:   Glucose, Bld 103 (*)    All other components within normal limits  URINALYSIS, ROUTINE W REFLEX MICROSCOPIC - Abnormal; Notable for the following  components:   Ketones, ur 5 (*)    Protein, ur 30 (*)  All other components within normal limits  SARS CORONAVIRUS 2 (TAT 6-24 HRS)    EKG None  Radiology DG Ribs Unilateral W/Chest Left  Result Date: 04/07/2019 CLINICAL DATA:  Fall.  Left chest bruising EXAM: LEFT RIBS AND CHEST - 3+ VIEW COMPARISON:  03/19/2018 FINDINGS: TAVR unchanged in position. Heart size upper normal. Negative for heart failure. Chronic lung disease prominent lung markings and scarring in the right lung base. No acute infiltrate or effusion Negative for acute left rib fracture. IMPRESSION: No acute abnormality. Electronically Signed   By: Franchot Gallo M.D.   On: 04/07/2019 16:01    Procedures Procedures (including critical care time)  Medications Ordered in ED Medications  sodium chloride 0.9 % bolus 1,000 mL (1,000 mLs Intravenous New Bag/Given 04/07/19 1511)    ED Course  I have reviewed the triage vital signs and the nursing notes.  Pertinent labs & imaging results that were available during my care of the patient were reviewed by me and considered in my medical decision making (see chart for details).    MDM Rules/Calculators/A&P                      Status post fall.  Questionable urinary tract infection.  Will obtain x-rays of the left ribs, basic labs, urinalysis, IV fluids.  1745: Recheck prior to discharge.  Feeling better.  Good color.  Conversing appropriately.  Tests reviewed with patient and his daughter.  Will follow up with primary care. Final Clinical Impression(s) / ED Diagnoses Final diagnoses:  Fall, initial encounter  Rib contusion, left, initial encounter  Weakness    Rx / DC Orders ED Discharge Orders    None       Nat Christen, MD 04/07/19 1447    Nat Christen, MD 04/07/19 1745

## 2019-04-09 ENCOUNTER — Ambulatory Visit: Payer: Medicare Other | Attending: Internal Medicine

## 2019-04-09 DIAGNOSIS — Z23 Encounter for immunization: Secondary | ICD-10-CM

## 2019-04-09 NOTE — Progress Notes (Signed)
   Covid-19 Vaccination Clinic  Name:  [name]    MRN: [id number] DOB: 1931/03/15  04/09/2019  [name] was observed post Covid-19 immunization for 15 minutes without incident. He was provided with Vaccine Information Sheet and instruction to access the V-Safe system.   [name] was instructed to call 911 with any severe reactions post vaccine: Marland Kitchen Difficulty breathing  . Swelling of face and throat  . A fast heartbeat  . A bad rash all over body  . Dizziness and weakness   Immunizations Administered    Name Date Dose VIS Date Route   Pfizer COVID-19 Vaccine 04/09/2019  8:30 AM 0.3 mL 12/27/2018 Intramuscular   Manufacturer: [place]   Lot: [id number]   [place]: [id number]

## 2019-04-10 ENCOUNTER — Encounter: Payer: Self-pay | Admitting: Neurology

## 2019-04-14 ENCOUNTER — Encounter: Payer: Self-pay | Admitting: Neurology

## 2019-04-14 ENCOUNTER — Other Ambulatory Visit: Payer: Self-pay

## 2019-04-14 ENCOUNTER — Telehealth: Payer: Self-pay | Admitting: Neurology

## 2019-04-14 ENCOUNTER — Ambulatory Visit (INDEPENDENT_AMBULATORY_CARE_PROVIDER_SITE_OTHER): Payer: Medicare Other | Admitting: Neurology

## 2019-04-14 VITALS — BP 150/74 | HR 77

## 2019-04-14 DIAGNOSIS — I499 Cardiac arrhythmia, unspecified: Secondary | ICD-10-CM

## 2019-04-14 DIAGNOSIS — R270 Ataxia, unspecified: Secondary | ICD-10-CM

## 2019-04-14 DIAGNOSIS — G20 Parkinson's disease: Secondary | ICD-10-CM

## 2019-04-14 DIAGNOSIS — Z9181 History of falling: Secondary | ICD-10-CM

## 2019-04-14 DIAGNOSIS — R531 Weakness: Secondary | ICD-10-CM

## 2019-04-14 DIAGNOSIS — I25118 Atherosclerotic heart disease of native coronary artery with other forms of angina pectoris: Secondary | ICD-10-CM

## 2019-04-14 DIAGNOSIS — W19XXXS Unspecified fall, sequela: Secondary | ICD-10-CM

## 2019-04-14 DIAGNOSIS — R2689 Other abnormalities of gait and mobility: Secondary | ICD-10-CM

## 2019-04-14 NOTE — Telephone Encounter (Signed)
Medicare/[employer] supp order sent to GI. No auth they will reach out to the patient to schedule.  

## 2019-04-14 NOTE — Patient Instructions (Addendum)
According to my records, you have continue to take Mirapex 0.5 mg 3 times daily.  Please check your prescription bottle at home or check with the pharmacy.  If you have been taking it, you can continue with that.  If you have not actually been taking this, we may keep you off of it as adding it at this time may not be as beneficial.  It can cause side effects including swelling, sleepiness and nausea.  As discussed, I will order a head CT without contrast as you fell about 8 days ago. We will call you with the results.   Your heart rate is irregular today. Please check with your cardiologist, if you need to come in for a sooner than scheduled appointment.

## 2019-04-14 NOTE — Addendum Note (Signed)
Addended by: Star Age on: 04/14/2019 02:03 PM   Modules accepted: Orders

## 2019-04-14 NOTE — Progress Notes (Addendum)
Subjective:    Patient ID: [name] is a 86 y.o. male.  HPI     Interim history:   [name] is a very pleasant 86 year old right-handed gentleman with an underlying medical history of hypertension, hyperlipidemia, history of cancer, ex-smoker, CHF, aortic stenosis with recent aortic valve replacement in March 2020, and complex sleep apnea on BiPAP ST, with who presents for follow-up consultation of his left-sided predominant Parkinson's disease as well as complex sleep apnea, on treatment with BiPAP ST.  The patient is accompanied by his son, [name], today.  I last saw him on 10/15/19, at which time he was not using his BiPAP ST very much. He had discomfort with the mask.  He had fallen once, had tripped over the cat. He had more stress due to his wife's medical complications.   Today, 04/14/2019: He reports very little on his own and indicates, that his son relate his history. [name] reports, that he has Significant and fairly sudden decline in mobility in the past approximately 10 days.  Patient fell about 8 days ago and had to go to the emergency room, I reviewed the emergency room records.  He bruised his rib, head CT was not done, he was suspected to have a urinary tract infection but urinalysis was fairly benign, he was given fluids.  He has become weaker, it is very difficult for him to mobilize even with help in with a walker.  He requires 24-7 supervision and [name] and his sister [name] are taking turns.  They have 2 other siblings out of state.  Sadly, patient's wife passed away in 2019-03-20. Appetite is okay, they tried a encourage fluid intake.  They have an aide come in from 8-12 Monday through Saturdays and they are trying to add another 8 from 12-5 daily.  He has had intermittent constipation but also diarrhea.  Patient indicates that he continues to take Mirapex 3 times daily but his son does not have it on the list.  Patient continues to take Sinemet 2 pills alternating  with 1 pill every 3 hours starting at 9 AM.  He has not been using his BiPAP, currently sleeps in his recliner.  I reviewed the BiPAP ST compliance data for the past 90 days, patient used his machine about 43 out of 90 days with percent use days greater than 4 hours at only 1% and no usage since 03/31/2019.  The patient's allergies, current medications, family history, past medical history, past social history, past surgical history and problem list were reviewed and updated as appropriate.    Previously (copied from previous notes for reference):    I saw him on 07/15/2018, at which time his BiPAP machine was not functional.  I wrote for a new machine.  He had lower extremity swelling, he had issues intermittently with constipation.  He had some intermittent balance problems.    I reviewed his BiPAP compliance data from 09/15/2018 through 10/14/2018 which is a total of 30 days, during which time he used his BiPAP 26 days with percent use days greater than 4 hours at 23% only, indicating significantly suboptimal compliance with an average usage of 3 hours and 44 minutes, residual AHI slightly elevated at 9.4/h, leak high consistently with a 95th percentile at 50.4 L/min on a pressure of 13/9 with a backup rate of 10.  In the past nearly 90 days his compliance has been similar.      I saw him in virtual video visit on 04/11/2018,  at which time we mutually agreed to reduce his Mirapex due to sleepiness reported.  He was advised to skip the midday dose and we would monitor his symptoms, perhaps further reduce or even eliminate his Mirapex altogether.  He was advised to take melatonin at night in the evening before bedtime to help him sleep.  He was advised to stay compliant with BiPAP ST.  He has benefited from the BiPAP ST and was eligible for a new machine soon.  He was supposed to see Dr. Vertell Limber in neurosurgery for a brain lesion.   He had an interim brain MRI with and without contrast as ordered by  neurosurgery on 06/28/2018 which showed a stable enhancing extraaxial mass in keeping with meningioma: IMPRESSION: 1. Stable appearance of extra-axial tumor with dural enhancement and typical characteristics of meningioma spanning from the anterior cranial fossa to the middle cranial fossa. 2. Stable mass effect. 3. Atrophy and white matter disease unchanged. 4. No acute intracranial abnormality or significant interval change.       I saw him on 08/20/2017, at which time he was not fully compliant with his BiPAP. He felt fairly stable as far as his PD. He had increased stress, his wife had been in and out of the hospital. He was trying to exercise regularly. We mutually agreed to increase his Sinemet to 2 pills alternating with one pill for a total of 9 pills.   Note, he was diagnosed with severe aortic stenosis in the interim and had a left heart catheterization in February 2020. He had aortic valve replacement on 03/19/2018.   Of note, the patient had an interim emergency room visit on 02/28/2018 after a fall.   He had a head CT and cervical spine CT without contrast on 02/28/2018 and I reviewed the results: IMPRESSION: Masslike lentiform isodense extra-axial collection over the right frontotemporal region measuring 7.0 x 3.3 cm in AP and transverse dimension. Couple small more hyperdense nodular foci along the medial aspect of this collection suggesting hemorrhagic foci. There is mild mass effect on the adjacent cortex although a plane of CSF is maintained. Midline shift 6 mm to the left. This appears to be more typical of a subacute to chronic process and may represent a subacute epidural hematoma versus extra-axial mass such as a meningioma. Recommend clinical correlation as MRI with without contrast may be helpful for further evaluation.   No acute cervical spine injury.   Minimal chronic ischemic microvascular disease.   Mild spondylosis of the cervical spine. Disc disease at  the C3-4 level.   He had a subsequent brain MRI with and without contrast on 02/28/2018 and I reviewed the results: IMPRESSION: 1. Right lateral frontotemporal extra-axial mass measuring up to 7.1 cm with typical features of meningioma. Differential of metastasis or hemangiopericytoma are less likely. There is a irregular peripheral multi cystic component that is in contact with the surface of the brain, however, there is no appreciable invasion or edema of the adjacent brain. 2. Mass effect results in displacement of the adjacent brain, partial effacement of right lateral ventricle, and 7 mm right-to-left midline shift. 3. Background of mild chronic microvascular ischemic changes and volume loss of the brain.   I reviewed the emergency room records from 02/28/2018. He was advised to follow-up with Dr. Vertell Limber in neurosurgery as an outpatient.   I saw him on 02/19/2017, at which time he was not fully compliant with BiPAP. Overall, his Parkinson symptoms have progressed. His wife had been hospitalized. His  daughter from [place] was in town. I suggested we increase his Sinemet to 1-1/2 pills alternating with one pill for a total of 7-1/2 pills daily from previously 6 pills daily. He was encouraged to continue with his BiPAP as best as possible. We talked about the importance of hydration and constipation management.   I reviewed his BiPAP compliance data from 07/21/2017 through 08/19/2017 which is a total of 30 days, during which time he used his machine every night with percent used days greater than 4 hours at 40%, indicating suboptimal compliance with an average usage of 3 hours and 48 minutes, residual AHI at goal at 1.8 per hour, leak acceptable with the 95th percentile at 13.8 L/m on a pressure of 13/9 cm with a backup rate of 10/m.    I saw him on 08/17/2016, at which time he was suboptimal with his BiPAP compliance. He felt that his tremor was worse. He had no recent falls, constipation  was also under control. He was not always exercising on a regular basis. His wife was concerned about his daytime somnolence. His wife felt that his driving was still okay, he limited his driving to daylight driving in familiar routes. I asked him to continue with Sinemet 1 pill 6 times a day and Mirapex 0.5 mg 3 times a day. I asked him to monitor his driving and to be consistent with his BiPAP usage.   I reviewed his BiPAP ST compliance data from 01/20/2017 through 02/24/2017, during which time he used his BiPAP machine every night but percent used days greater than 4 hours was only 17%, indicating suboptimal compliance with an average usage of 3 hours and 18 minutes. Residual AHI at goal at 2.1 per hour, leak acceptable with the 95th percentile at 17.9 L/m on a pressure of 13/9 with a backup rate of 10.    I saw him on 12/28/15, and which time he was doing overall fairly well. He was adequate with his BiPAP compliance. He had noted an increase in his tremor on the left side and also increase in stiffness. No recent falls for reported. His primary care reduced his amlodipine, he had more drooling. He had more nasal discharge, usually clear and very drippy at times. I asked him to increase his Sinemet to 1 pill 6 times a day. I suggested he continue with Mirapex at the same dose and will monitor his leg swelling. He was reminded to increase his water intake.   I reviewed his BiPAP ST compliance data from 07/18/2016 through 08/16/2016, which is a total of 30 days, during which time he used his BiPAP 29 days but percent used days greater than 4 hours was only 47%, indicating suboptimal compliance, average AHI 2 per hour, leak acceptable with the 95th percentile at 14.3 L/m, average usage of 4 hours. Pressure of 13/9 cm with a set rate of 10.    I saw him on 06/28/2015, at which time he reported doing okay, tremor perhaps worse. He noticed no significant difference after we increase the Sinemet. Constipation  was under control with MiraLAX. He was not exercising very much but does enjoy working in the yard. He had no recent falls. He was not using a cane or walker. He was compliant with his BiPAP ST. I suggested we keep his medications the same but monitor his leg swelling since he was on Mirapex.    I reviewed his BiPAP compliance data from 11/28/2015 through 12/27/2015, which is a total of 30 days,  during which time he used his machine every night with percent used days greater than 4 hours at 73%, indicating adequate compliance with an average usage of only 4 hours and 21 minutes. Residual AHI 2 per hour, leak on the acceptable side with the 95th percentile at 13.4 L/m on a pressure of 13/9 with a rate of 10.   12/28/2014, at which time he reported doing fairly well, no recent falls, tremor somewhat worse and dexterity worse. He had noted some trouble with his turns at times. He was on Sinemet 4 times a day, usually at 6 AM, 10 AM, 2 PM and 8 PM. He had constipation and was on a stool softener and prunes. He was to have a colonoscopy on 01/04/2015.    I reviewed his BiPAP ST compliance data from 05/29/2015 through 06/27/2015, which is a total of 30 days during which time he used his machine every night with percent used days greater than 4 hours at 97%, indicating excellent compliance with an average usage of 5 hours and 16 minutes, residual AHI 1.5 per hour, leak acceptable with the 95th percentile at 12.4 L/m on a pressure of 13/9 cm with a rate of 10.   I saw him on 07/09/2014, at whicht time he reported feeling stable for the most part. He had not experienced any drastic changes in his symptoms. He reported no falls. He had no significant low back pain and no longer was taking any narcotics. Unfortunately, his wife was involved in a car accident and sustained broken ribs and had to be in the ICU for some time. He was using his BiPAP machine regularly. He reported one skip night because of a stomach bug.  Overall, he felt he was sleeping well. He felt that the increase in Sinemet helped his trembling. He was tolerating his medication. He had no new major mood or memory issues.    I reviewed his BiPAP compliance data from 11/23/2014 through 12/22/2014 which is a total of 30 days during which time he used his machine every night with percent used days greater than 4 hours at 93%, indicating excellent compliance with an average usage of 4 hours and 54 minutes, residual AHI low at 1.7 per hour, leaked low with the 95th percentile at 7.4 L/m on a pressure of 13/9 cm with a backup rate of 10.    I saw him on 03/03/2014, at which time he reported experiencing thick mucus first thing in the morning. His tremor was worse per wife. He received new supplies recently. He has some thick mucus first thing in the morning. His tremor has become worse per wife. His back surgery in November 2015 had relieved much of his pain. He was no longer on pain medication. He was taking stool softeners for his constipation. I suggested he continue with Mirapex 3 times a day but I did ask him to increase Sinemet to 4 times a day.   I reviewed his BiPAP compliance data from 06/07/2014 through 07/06/2014 which is a total of 30 days during which time he used his machine 29 days with percent used days greater than 4 hours at 87%, indicating very good compliance with an average usage of 5 hours and 2 minutes, residual AHI low at 1.6 per hour, leak low with the 95th percentile at 7.6 L/m on a pressure of 13/9 with a rate of 10.   I saw him on 12/02/2013, at which time he was compliant with BiPAP ST. He had spine  surgery under Dr. Cyndy Freeze on 12/04/2013 which went well. He is in physical therapy. He had an L spine MRI on 11/06/13: Degenerative lumbar spondylosis with multilevel disc disease and facet disease. There is bilateral lateral recess and bilateral foraminal stenosis at L2-3 and L3-4. The most significant level however is L4-5 with there  is severe spinal, bilateral lateral recess and foraminal stenosis. We talked about his fluid intake. He was still not consistent with his dose timings for his Sinemet and Mirapex.   I reviewed his compliance data from 01/27/2014 through 02/25/2014 which is a total of 30 days during which time he used his machine every night with percent used days greater than 4 hours of 87%, indicating very good compliance with an average usage of 5 hours and 6 minutes, residual AHI of 3.5 per hour and leak acceptable with the 95th percentile at 15 L/m.   I saw him on 05/27/2013, at which time he reported being compliant with his BiPAP, averaging 4-5 hours each night. He was still taking his Sinemet with his meals on most days. He felt his tremor was a little worse. His wife felt that he was otherwise stable. He denied any new cognitive issues, depression, hallucinations, anxiety, delusions. He was drinking 3 cups of coffee in the morning and not enough water. I did not increase his medication but asked him to take his Sinemet away from his mealtimes. I considered Linzess for chronic constipation but asked him to increase his water intake and watch constipation symptoms before we use medications.   I reviewed his compliance data from 10/31/2013 through 11/29/2013 which is a total of 30 days during which time he used his machine every night except for 1 night. Percent used days greater than 4 hours was 90% indicating excellent compliance. Residual AHI at 2.5 per hour, leaked low. Pressure at 13/9 with a rate of 10.   I saw him on 11/27/2012, at which time I did not change his medication regimen with the exception of the timing for his Sinemet and Mirapex: I advised him to take it at 7 AM, 11 AM and 4 PM. I also asked him to continue using his BiPAP regularly and take it with him on any vacation trips. He was congratulated on his compliance.   I saw him on 07/26/2012 after had his sleep study. He has been compliant on BiPAP  therapy. I had increased his Sinemet to one pill 3 times a day. He was taking it with his meals and did not note any significant improvement in his symptoms, but, then again, he was taking it right after his meals. He was asking whether he should take his BiPAP machine with him to his planned trip to [place].     I first met him on 02/19/2012 after his baseline sleep study. His total AHI was 39.5 per hour based primarily on central apneas. His obstructive AHI was around 15 per hour. His oxyhemoglobin desaturation nadir was 85% and he spent 3 hours and 31 minutes below the saturation of 90% for the night. I asked him to come back for a CPAP titration study, possibly BiPAP but he wanted to hold off until his appointment in April as he was going to be out of town and he also wanted to bring his wife for discussion.   I then saw him back on 04/17/2012 and again went over his test results with him and his wife. He had been doing well from the PD standpoint. He agreed  to come back for another sleep study with full night titration. His sleep titration study was on 05/14/2012 and I went over his test results with him and his wife in detail during our visit in July. Sleep efficiency was reduced at 71.5% with a latency to sleep of 2 minutes. Wake after sleep onset was highly elevated at 124 minutes with mild to moderate sleep fragmentation noted. He had increased percentage of REM sleep at 27.2%. He had a normal REM latency. There were no significant aortic leg movements. He was started on CPAP at a pressure of 5 cm and titrated up to 9 cm of water pressure but he had significant central apneas and therefore changed to BiPAP at 11/7 and then switch to ST mode for ongoing central events. His final pressure was 13/9 with a backup rate of 10 on which she had a residual AHI of 0 per hour and supine REM sleep achieved. Oxyhemoglobin desaturation nadir on the final pressure was 90%.   He was placed on BiPAP ST at 13/9 cm  with a backup rate of 10. I reviewed his compliance from 06/26/2012 through 07/25/2012 (29 days), during which time he used it every day. His average usage was 5 hours and 6 minutes and percent used days greater than 4 hours was 96% indicating excellent compliance. His residual AHI was around 6 indicating fairly reasonable pressure settings. He reported tolerating the treatment and he changed from a FFM to a nasal mask. He denied depression, memory loss, lightheadedness, or hallucinations. I also reviewed more recent compliance data from 09/29/2012 through 10/28/2012 (30 days), during which time he used his machine every day. His average usage was 5 hours and 23 minutes, his percent used days greater than 4 hours was 29 days, which is 97%, indicating excellent compliance. His residual AHI was 2.6 per hour indicating an appropriate treatment setting of 13/9 cm with a backup rate of 10 per minute.   His Past Medical History Is Significant For: Past Medical History:  Diagnosis Date  . Arthritis   . Brain mass    right lateral frontotemporal extra-axial mass 7.1 cm with typical features of meningioma 02/28/18 (to follow-up w/ neurosurgeon Dr. Vertell Limber)  . Cancer [facility])    h/o  skin  cancer  . Coronary artery disease    s/p DES to LAD (02/19/18)  . Ex-smoker   . GERD (gastroesophageal reflux disease)   . Hypercholesteremia   . Hypertension   . Lumbar stenosis   . OSA (obstructive sleep apnea)   . Pancreatic duct dilated   . Parkinson's disease ([place])   . S/P TAVR (transcatheter aortic valve replacement)   . Severe aortic stenosis     His Past Surgical History Is Significant For: Past Surgical History:  Procedure Laterality Date  . APPENDECTOMY    . BACK SURGERY  11/2013  . COLONOSCOPY  11/17/2008   Rourk: Sessile polyp in the cecum status post saline-assisted piecemeal polypectomy, resolution clipping and epinephrine injection therapy performed. Shallow sigmoid diverticula.. Tubular adenoma  .  COLONOSCOPY  11/19/2008   Fields: Large amount of liquid blood with clots seen throughout the colon. Previous [place] resolution clip remained in place. Purple spot seen in the lateral aspect polypectomy site, 3 resolution clips placed here. 6 mm sessile ascending colon polyp seen and not manipulated.  . COLONOSCOPY  11/25/2008   Rourk: Blood-tinged colonic effluent, suspect recurrent post polypectomy bleeding status post placement of 3 clips on the polypectomy site on top of the 3  clips that already existed, one had fallen off since last procedure. One small polyp distal to the cecum not manipulated  . COLONOSCOPY  12/06/2009   Rourk: Internal hemorrhoids, scattered left-sided diverticula, cecal polyps, one snared and subsequently clipped, 2 adjacent diminutive polyps ablated. tubular adenoma  . COLONOSCOPY N/A 01/21/2015   Dr. Gala Romney: 1.5 cm carpet-type polyp just distal to the ileocecal valve removed piecemeal fashion and ablated. Small polyp in the rectum. Pathology-tubular adenomas  . COLONOSCOPY N/A 09/01/2015   Procedure: COLONOSCOPY;  Surgeon: Daneil Dolin, MD;  [facility];  Service: Endoscopy;  Laterality: N/A;  845  . CORONARY STENT INTERVENTION N/A 02/19/2018   Procedure: CORONARY STENT INTERVENTION;  Surgeon: [name], MD;  Location: [place] [facility];  Service: Cardiovascular;  Laterality: N/A;  . EYE SURGERY     bilateral  cataracts  . INTRAVASCULAR PRESSURE WIRE/FFR STUDY N/A 02/19/2018   Procedure: INTRAVASCULAR PRESSURE WIRE/FFR STUDY;  Surgeon: [name], MD;  Location: [place] [facility];  Service: Cardiovascular;  Laterality: N/A;  . MENISCUS REPAIR     right knee  2000  . POLYPECTOMY  09/01/2015   Procedure: POLYPECTOMY;  Surgeon: Daneil Dolin, MD;  Location: [facility];  Service: Endoscopy;;  colon  . RIGHT/LEFT HEART CATH AND CORONARY ANGIOGRAPHY N/A 02/19/2018   Procedure: RIGHT/LEFT HEART CATH AND CORONARY ANGIOGRAPHY;  Surgeon: [name], MD;   Location: [place] [facility];  Service: Cardiovascular;  Laterality: N/A;  . TEE WITHOUT CARDIOVERSION N/A 03/19/2018   Procedure: TRANSESOPHAGEAL ECHOCARDIOGRAM (TEE);  Surgeon: [name], MD;  Location: [place] [facility];  Service: Open Heart Surgery;  Laterality: N/A;  . TONSILLECTOMY Bilateral   . TRANSCATHETER AORTIC VALVE REPLACEMENT, TRANSFEMORAL N/A 03/19/2018   Procedure: TRANSCATHETER AORTIC VALVE REPLACEMENT, TRANSFEMORAL;  Surgeon: [name], MD;  Location: [place] [facility];  Service: Open Heart Surgery;  Laterality: N/A;    His Family History Is Significant For: Family History  Problem Relation Age of Onset  . Dementia Mother   . Cancer Brother   . Stroke Brother     His Social History Is Significant For: Social History   Socioeconomic History  . Marital status: Widowed    Spouse name: Clinical research associate  . Number of children: 4  . Years of education: 19  . Highest education level: Not on file  Occupational History  . Occupation: Retired   Tobacco Use  . Smoking status: Former Smoker    Packs/day: 1.00    Types: Cigarettes, Pipe    Start date: 01/22/1951    Quit date: 01/21/1989    Years since quitting: 30.2  . Smokeless tobacco: Never Used  Substance and Sexual Activity  . Alcohol use: No    Alcohol/week: 0.0 standard drinks  . Drug use: No  . Sexual activity: Not on file  Other Topics Concern  . Not on file  Social History Narrative  . Not on file   Social Determinants of Health   Financial Resource Strain:   . Difficulty of Paying Living Expenses:   Food Insecurity:   . Worried About Charity fundraiser in the Last Year:   . Arboriculturist in the Last Year:   Transportation Needs:   . Film/video editor (Medical):   Marland Kitchen Lack of Transportation (Non-Medical):   Physical Activity:   . Days of Exercise per Week:   . Minutes of Exercise per Session:   Stress:   . Feeling of Stress :  Social Connections:   . Frequency of Communication with  Friends and Family:   . Frequency of Social Gatherings with Friends and Family:   . Attends Religious Services:   . Active Member of Clubs or Organizations:   . Attends Archivist Meetings:   Marland Kitchen Marital Status:     His Allergies Are:  No Known Allergies:   His Current Medications Are:  Outpatient Encounter Medications as of 04/14/2019  Medication Sig  . amLODipine (NORVASC) 5 MG tablet TAKE 1 TABLET BY MOUTH  DAILY  . aspirin 81 MG chewable tablet Chew 81 mg by mouth daily.  Marland Kitchen atorvastatin (LIPITOR) 40 MG tablet TAKE 1 TABLET BY MOUTH  DAILY AT 6PM (Patient taking differently: Take 40 mg by mouth daily at 6 PM. )  . brimonidine (ALPHAGAN) 0.2 % ophthalmic solution Place 1 drop into both eyes 3 (three) times daily.   . calcium gluconate 500 MG tablet Take 500 mg by mouth daily.  . carbidopa-levodopa (SINEMET IR) 25-100 MG tablet TAKE 2 TABLETS BY MOUTH AT  6AM, 1 TAB AT 9AM, 2 TABS  AT 12PM, 1 TAB AT 3PM, 2  TABS AT 6PM, AND 1 TAB AT  9PM  . dorzolamide-timolol (COSOPT) 22.3-6.8 MG/ML ophthalmic solution Place 1 drop into both eyes 2 (two) times daily.   Marland Kitchen glucosamine-chondroitin 500-400 MG tablet Take 1 tablet by mouth daily.   Marland Kitchen latanoprost (XALATAN) 0.005 % ophthalmic solution Place 1 drop into both eyes at bedtime.   . metoprolol succinate (TOPROL-XL) 25 MG 24 hr tablet TAKE 1 TABLET BY MOUTH  DAILY  . Multiple Vitamin (MULTIVITAMIN) tablet Take 1 tablet by mouth daily.  . nitroGLYCERIN (NITROSTAT) 0.4 MG SL tablet Place 1 tablet (0.4 mg total) under the tongue every 5 (five) minutes as needed.  . pantoprazole (PROTONIX) 20 MG tablet TAKE 1 TABLET BY MOUTH  DAILY  . vitamin C (ASCORBIC ACID) 500 MG tablet Take 500 mg by mouth daily.  Marland Kitchen amoxicillin (AMOXIL) 500 MG tablet Take 4 tablets (2,000 mg) one hour prior to all dental visits. (Patient not taking: Reported on 04/14/2019)  . losartan (COZAAR) 100 MG tablet Take 1 tablet (100 mg total) by mouth daily.  . pramipexole  (MIRAPEX) 0.5 MG tablet Take 1 tablet (0.5 mg total) by mouth 3 (three) times daily.  . [DISCONTINUED] furosemide (LASIX) 40 MG tablet Take 1 tablet (40 mg total) by mouth daily as needed.  . [DISCONTINUED] potassium chloride (KLOR-CON) 10 MEQ tablet Take 1 tablet (10 mEq total) by mouth daily as needed. Take only when you take your Lasix.   No facility-administered encounter medications on file as of 04/14/2019.  :  Review of Systems:  Out of a complete 14 point review of systems, all are reviewed and negative with the exception of these symptoms as listed below: Review of Systems  Neurological:       Pt presents today to discuss his PD and bipap. Pt has had a change in the past 10 days. Pt is unable to stand. Pt sleeps in a recliner. Pt's family stays with him 24/7. Pt is unsure if he is taking mirapex.    Objective:  Neurological Exam  Physical Exam  Physical Examination:   Vitals:   04/14/19 1034  BP: (!) 150/74  Pulse: 77    General Examination: The patient is a very pleasant 86 y.o. male in no acute distress. He appears frail and deconditioned, well groomed, in [facility].   HEENT:He has a moderate to  severe degree of nuchal rigidity with a mildto moderatelower jaw and lip tremor, fairly constant. He has a moderately to severely masked facies with decreased eye blink rate. Hearing is mildly impaired. He is status post cataract repairs bilaterally. He has prescription eyeglasses. Speech is moderatelyto severelyhypophonic,with mild dysarthria noted,scant speech. Pupils are reactive to light,extraoculartracking is difficult for him.   Chest is clear to auscultation without wheezing or rhonchi noted.  Heart sounds are irregular today, maybe irregularly irreg. mild systolic murmur.  Abdomen is soft, nontender with normal bowel sounds appreciated.  There istrace edema in the distal lower extremities bilaterally.  No joint deformities are noted.   Skin is warm and dry  but he does have several old appearing bruises across the dorsi of his hands,worse than before. Chronic hyperpigmentation/discolorationin theDistal lower extremities bilaterally.  Neurologically: Mental status: The patient is awake, alert and oriented, memory, attention, language and knowledge are mildly impaired, more slowness in responding.Cranial nerves are as described under HEENT exam.   Motor exam: thin bulk, global strength of 4/5. Tone is increased with some cogwheeling noted in both upper extremities, L more than R. Tone is also increased in the left lower extremity. Tone is mildly increased on the right side. He has a moderate constant resting tremor in the left upper extremity and a mild to moderate resting tremor in the RLE. Fine motor skills are moderately to severely impaired. He did not bring his walker. I did not have hime stand or walk for me today. Sensory exam is intact to light touch.  Assessment and Plan:   In summary, LIMUEL NIEBLAS is a very pleasant 86 year old male with an underlying medical history of hypertension, hyperlipidemia, history of cancer, complex sleep apnea, aortic stenosis with s/p AVR in March 2020, who presents for followup consultation off his left-sided predominant Parkinson's disease and complex sleep apnea. He has had fairly steep decline in his mobility in the past few weeks, had a fall about 8 days ago.  He is sleepy during the day according to the son.  I noticed irregularity in his heartbeat, not completely sure if he has frequent PVCs versus A. fib.  They are encouraged to get in touch with the cardiology office and make an appointment if possible for a formal recheck and possible EKG.  I would like to proceed with a head CT without contrast due to his recent fall in the bathtub.  He was not found until the next morning according to the son.  He has had more stressors, needs more supervision, wife sadly passed away last month.  He has 24-7  supervision currently and his son who lives behind him provides meals, his daughter also takes turns staying with him.  They are also trying to get more help during the day.  He is advised to continue with his medication regimen.  If he is on Mirapex he can continue with 0.5 mg 3 times daily.  If he has not been taking it I would favor not restarting it at this point.  We talked about the importance of fall prevention and supportive treatment at this time.  We will call with the head CT results.  They are advised to follow-up routinely in 3 months, sooner if needed.  I answered all the questions today and the patient and his son were in agreement. He is at this point no longer driving. I will refer him to [facility] for strengthening exercises and ROM exercises, as he has become  weaker and likely deconditioned at this point.  This was an extended visit.   I spent 40 minutes in total face-to-face time and in reviewing records during pre-charting, more than 50% of which was spent in counseling and coordination of care, reviewing test results, reviewing medications and treatment regimen and/or in discussing or reviewing the diagnosis of PD, the prognosis and treatment options. Pertinent laboratory and imaging test results that were available during this visit with the patient were reviewed by me and considered in my medical decision making (see chart for details).

## 2019-04-15 NOTE — Telephone Encounter (Signed)
Spoke to the patient daughter they are going to walk into GI tomorrow 04/16/19 at GI to do the CT.

## 2019-04-16 ENCOUNTER — Ambulatory Visit
Admission: RE | Admit: 2019-04-16 | Discharge: 2019-04-16 | Disposition: A | Discharge status: Home / Self Care | Payer: Medicare Other | Source: Ambulatory Visit | Attending: Neurology | Admitting: Neurology

## 2019-04-16 DIAGNOSIS — G20 Parkinson's disease: Secondary | ICD-10-CM | POA: Diagnosis not present

## 2019-04-16 DIAGNOSIS — Z9181 History of falling: Secondary | ICD-10-CM

## 2019-04-16 DIAGNOSIS — W19XXXS Unspecified fall, sequela: Secondary | ICD-10-CM

## 2019-04-16 DIAGNOSIS — R531 Weakness: Secondary | ICD-10-CM

## 2019-04-16 DIAGNOSIS — R2689 Other abnormalities of gait and mobility: Secondary | ICD-10-CM

## 2019-04-17 ENCOUNTER — Other Ambulatory Visit: Payer: Self-pay

## 2019-04-17 DIAGNOSIS — G20 Parkinson's disease: Secondary | ICD-10-CM

## 2019-04-17 MED ORDER — PRAMIPEXOLE DIHYDROCHLORIDE 0.5 MG PO TABS: 0.5000 mg | ORAL_TABLET | Freq: Three times a day (TID) | ORAL | 3 refills | Status: DC

## 2019-04-18 DIAGNOSIS — Z87891 Personal history of nicotine dependence: Secondary | ICD-10-CM | POA: Diagnosis not present

## 2019-04-18 DIAGNOSIS — I499 Cardiac arrhythmia, unspecified: Secondary | ICD-10-CM | POA: Diagnosis not present

## 2019-04-18 DIAGNOSIS — K5900 Constipation, unspecified: Secondary | ICD-10-CM | POA: Diagnosis not present

## 2019-04-18 DIAGNOSIS — Z9181 History of falling: Secondary | ICD-10-CM | POA: Diagnosis not present

## 2019-04-18 DIAGNOSIS — I509 Heart failure, unspecified: Secondary | ICD-10-CM | POA: Diagnosis not present

## 2019-04-18 DIAGNOSIS — I110 Hypertensive heart disease with heart failure: Secondary | ICD-10-CM | POA: Diagnosis not present

## 2019-04-18 DIAGNOSIS — G4739 Other sleep apnea: Secondary | ICD-10-CM | POA: Diagnosis not present

## 2019-04-18 DIAGNOSIS — G20 Parkinson's disease: Secondary | ICD-10-CM | POA: Diagnosis not present

## 2019-04-18 DIAGNOSIS — E785 Hyperlipidemia, unspecified: Secondary | ICD-10-CM | POA: Diagnosis not present

## 2019-04-18 DIAGNOSIS — Z9989 Dependence on other enabling machines and devices: Secondary | ICD-10-CM | POA: Diagnosis not present

## 2019-04-18 DIAGNOSIS — D329 Benign neoplasm of meninges, unspecified: Secondary | ICD-10-CM | POA: Diagnosis not present

## 2019-04-18 DIAGNOSIS — R197 Diarrhea, unspecified: Secondary | ICD-10-CM | POA: Diagnosis not present

## 2019-04-18 NOTE — Progress Notes (Signed)
No acute findings on recent CT head wo contrast, stable findings and known meningioma, which is reported to be stable too. Please update patient's son.  Michel Bickers

## 2019-04-21 ENCOUNTER — Telehealth: Payer: Self-pay

## 2019-04-21 DIAGNOSIS — E785 Hyperlipidemia, unspecified: Secondary | ICD-10-CM | POA: Diagnosis not present

## 2019-04-21 DIAGNOSIS — I110 Hypertensive heart disease with heart failure: Secondary | ICD-10-CM | POA: Diagnosis not present

## 2019-04-21 DIAGNOSIS — D329 Benign neoplasm of meninges, unspecified: Secondary | ICD-10-CM | POA: Diagnosis not present

## 2019-04-21 DIAGNOSIS — I499 Cardiac arrhythmia, unspecified: Secondary | ICD-10-CM | POA: Diagnosis not present

## 2019-04-21 DIAGNOSIS — I509 Heart failure, unspecified: Secondary | ICD-10-CM | POA: Diagnosis not present

## 2019-04-21 DIAGNOSIS — G20 Parkinson's disease: Secondary | ICD-10-CM | POA: Diagnosis not present

## 2019-04-21 NOTE — Telephone Encounter (Signed)
I called pt and advised him of the CT head results. Pt verbalized understanding of results. Pt had no questions at this time but was encouraged to call back if questions arise.   Of note, pt's son [name] that accompanied him to his last visit is not on the [facility].

## 2019-04-21 NOTE — Telephone Encounter (Signed)
-----   Message from Star Age, MD sent at 04/18/2019 11:45 AM EDT ----- No acute findings on recent CT head wo contrast, stable findings and known meningioma, which is reported to be stable too. Please update patient's son.  Michel Bickers

## 2019-04-22 ENCOUNTER — Telehealth: Payer: Self-pay | Admitting: Neurology

## 2019-04-22 NOTE — Telephone Encounter (Signed)
[name] with [facility] called to report that when she arrived at pt's home on yesterday around 12 she found pt on the floor.  Jalene Mullet said pt did not hurt himself, he stated he just slipped from the chair.  This is FYI no call back requested.

## 2019-04-22 NOTE — Telephone Encounter (Signed)
Noted, thank you

## 2019-04-24 DIAGNOSIS — D329 Benign neoplasm of meninges, unspecified: Secondary | ICD-10-CM | POA: Diagnosis not present

## 2019-04-24 DIAGNOSIS — G20 Parkinson's disease: Secondary | ICD-10-CM | POA: Diagnosis not present

## 2019-04-24 DIAGNOSIS — I509 Heart failure, unspecified: Secondary | ICD-10-CM | POA: Diagnosis not present

## 2019-04-24 DIAGNOSIS — I499 Cardiac arrhythmia, unspecified: Secondary | ICD-10-CM | POA: Diagnosis not present

## 2019-04-24 DIAGNOSIS — I110 Hypertensive heart disease with heart failure: Secondary | ICD-10-CM | POA: Diagnosis not present

## 2019-04-24 DIAGNOSIS — E785 Hyperlipidemia, unspecified: Secondary | ICD-10-CM | POA: Diagnosis not present

## 2019-04-25 DIAGNOSIS — H401134 Primary open-angle glaucoma, bilateral, indeterminate stage: Secondary | ICD-10-CM | POA: Diagnosis not present

## 2019-04-29 DIAGNOSIS — I509 Heart failure, unspecified: Secondary | ICD-10-CM | POA: Diagnosis not present

## 2019-04-29 DIAGNOSIS — G20 Parkinson's disease: Secondary | ICD-10-CM | POA: Diagnosis not present

## 2019-04-29 DIAGNOSIS — I499 Cardiac arrhythmia, unspecified: Secondary | ICD-10-CM | POA: Diagnosis not present

## 2019-04-29 DIAGNOSIS — D329 Benign neoplasm of meninges, unspecified: Secondary | ICD-10-CM | POA: Diagnosis not present

## 2019-04-29 DIAGNOSIS — E785 Hyperlipidemia, unspecified: Secondary | ICD-10-CM | POA: Diagnosis not present

## 2019-04-29 DIAGNOSIS — I110 Hypertensive heart disease with heart failure: Secondary | ICD-10-CM | POA: Diagnosis not present

## 2019-05-02 ENCOUNTER — Telehealth: Payer: Self-pay | Admitting: Cardiovascular Disease

## 2019-05-02 DIAGNOSIS — G20 Parkinson's disease: Secondary | ICD-10-CM | POA: Diagnosis not present

## 2019-05-02 DIAGNOSIS — I499 Cardiac arrhythmia, unspecified: Secondary | ICD-10-CM | POA: Diagnosis not present

## 2019-05-02 DIAGNOSIS — D329 Benign neoplasm of meninges, unspecified: Secondary | ICD-10-CM | POA: Diagnosis not present

## 2019-05-02 DIAGNOSIS — I509 Heart failure, unspecified: Secondary | ICD-10-CM | POA: Diagnosis not present

## 2019-05-02 DIAGNOSIS — I110 Hypertensive heart disease with heart failure: Secondary | ICD-10-CM | POA: Diagnosis not present

## 2019-05-02 DIAGNOSIS — E785 Hyperlipidemia, unspecified: Secondary | ICD-10-CM | POA: Diagnosis not present

## 2019-05-02 NOTE — Telephone Encounter (Signed)
Spoke with the patient's son who states that the patient has had some increased swelling in his legs over the past week. On Monday they restarted him on Lasix 40 mg daily. Also started him back on potassium. The son states that he does not believe that it has helped. The patient denies any shortness of breath or chest pain. The patient has not been keeping up with his weight, I advised that he should weigh himself daily in the mornings. The son states that he will weigh him tomorrow morning and compare to a weight that they have from 2 weeks ago. Advised son that the patient should keep his legs elevated and reduce sodium in his diet. Patient has an appointment with Dr. Burt Knack on 04/19. Advised to call back if the patient develops any symptoms and to continue taking the Lasix.

## 2019-05-02 NOTE — Telephone Encounter (Signed)
Pt c/o swelling: STAT is pt has developed SOB within 24 hours  1) How much weight have you gained and in what time span? Doesn't know  2) If swelling, where is the swelling located? Knees down   3) Are you currently taking a fluid pill? Yes   4) Are you currently SOB? No   5) Do you have a log of your daily weights (if so, list)? No   6) Have you gained 3 pounds in a day or 5 pounds in a week? Yes, 5 lbs in a week   7) Have you traveled recently? No   [name] is calling stating the patient's swelling became so severe they had to start him back on Lasix. They do not feel the medication has worked as it should so an appointment with Dr. Burt Knack has been scheduled for Monday 05/05/19 at 3:20 PM. Please advise.

## 2019-05-05 ENCOUNTER — Encounter: Payer: Self-pay | Admitting: Cardiovascular Disease

## 2019-05-05 ENCOUNTER — Ambulatory Visit (INDEPENDENT_AMBULATORY_CARE_PROVIDER_SITE_OTHER): Payer: Medicare Other | Admitting: Cardiovascular Disease

## 2019-05-05 ENCOUNTER — Other Ambulatory Visit: Payer: Self-pay

## 2019-05-05 VITALS — BP 110/54 | HR 78 | Ht 70.0 in | Wt 185.0 lb

## 2019-05-05 DIAGNOSIS — I5032 Chronic diastolic (congestive) heart failure: Secondary | ICD-10-CM

## 2019-05-05 DIAGNOSIS — I25118 Atherosclerotic heart disease of native coronary artery with other forms of angina pectoris: Secondary | ICD-10-CM | POA: Diagnosis not present

## 2019-05-05 DIAGNOSIS — R600 Localized edema: Secondary | ICD-10-CM

## 2019-05-05 NOTE — Patient Instructions (Signed)
Medication Instructions:  1) STOP LASIX AND POTASSIUM 2) If your leg swelling returns, resume Lasix and potassium (take together) for 3 consecutive days. Then take Monday Wednesdays, and Fridays. *If you need a refill on your cardiac medications before your next appointment, please call your pharmacy*  Follow-Up: Please see Dr. Bronson Ing back as planned around Christmas.

## 2019-05-05 NOTE — Progress Notes (Signed)
Cardiology Office Note:    Date:  05/05/2019   ID:  [name], DOB 1931/02/13, MRN [id number]  PCP:  Celene Squibb, MD  Cardiologist:  Kate Sable, MD  Electrophysiologist:  None   Referring MD: Celene Squibb, MD   Chief Complaint  Patient presents with  . Leg Swelling    History of Present Illness:    [name] is a 86 y.o. male with a hx of aortic stenosis status post TAVR in March 2020, presenting for evaluation of leg swelling.  The patient is here with his son today. He was seen by Nell Range, PA-C, on March 19, 2019. He was stable from a cardiac perspective but complained of frequent urination. Lasix was stopped and he did better with respect to bladder control and urination. He was later started on Mirapex by his neurologist and they feel this has resulted in significant leg swelling. It has now been stopped and he is taking lasix again, with modest improvement. When they called the office for an appointment at the end of last week, the patient's leg swelling was still significant, but it has improved over the weekend.  The patient has not had any chest pain or shortness of breath.  He sleeps in a recliner so difficult to know about orthopnea or PND.  Past Medical History:  Diagnosis Date  . Arthritis   . Brain mass    right lateral frontotemporal extra-axial mass 7.1 cm with typical features of meningioma 02/28/18 (to follow-up w/ neurosurgeon Dr. Vertell Limber)  . Cancer [facility])    h/o  skin  cancer  . Coronary artery disease    s/p DES to LAD (02/19/18)  . Ex-smoker   . GERD (gastroesophageal reflux disease)   . Hypercholesteremia   . Hypertension   . Lumbar stenosis   . OSA (obstructive sleep apnea)   . Pancreatic duct dilated   . Parkinson's disease ([place])   . S/P TAVR (transcatheter aortic valve replacement)   . Severe aortic stenosis     Past Surgical History:  Procedure Laterality Date  . APPENDECTOMY    . BACK SURGERY  11/2013  . COLONOSCOPY   11/17/2008   Rourk: Sessile polyp in the cecum status post saline-assisted piecemeal polypectomy, resolution clipping and epinephrine injection therapy performed. Shallow sigmoid diverticula.. Tubular adenoma  . COLONOSCOPY  11/19/2008   Fields: Large amount of liquid blood with clots seen throughout the colon. Previous [place] resolution clip remained in place. Purple spot seen in the lateral aspect polypectomy site, 3 resolution clips placed here. 6 mm sessile ascending colon polyp seen and not manipulated.  . COLONOSCOPY  11/25/2008   Rourk: Blood-tinged colonic effluent, suspect recurrent post polypectomy bleeding status post placement of 3 clips on the polypectomy site on top of the 3 clips that already existed, one had fallen off since last procedure. One small polyp distal to the cecum not manipulated  . COLONOSCOPY  12/06/2009   Rourk: Internal hemorrhoids, scattered left-sided diverticula, cecal polyps, one snared and subsequently clipped, 2 adjacent diminutive polyps ablated. tubular adenoma  . COLONOSCOPY N/A 01/21/2015   Dr. Gala Romney: 1.5 cm carpet-type polyp just distal to the ileocecal valve removed piecemeal fashion and ablated. Small polyp in the rectum. Pathology-tubular adenomas  . COLONOSCOPY N/A 09/01/2015   Procedure: COLONOSCOPY;  Surgeon: Daneil Dolin, MD;  [facility];  Service: Endoscopy;  Laterality: N/A;  845  . CORONARY STENT INTERVENTION N/A 02/19/2018   Procedure: CORONARY STENT INTERVENTION;  Surgeon: [name], MD;  Location: [place] CV LAB;  Service: Cardiovascular;  Laterality: N/A;  . EYE SURGERY     bilateral  cataracts  . INTRAVASCULAR PRESSURE WIRE/FFR STUDY N/A 02/19/2018   Procedure: INTRAVASCULAR PRESSURE WIRE/FFR STUDY;  Surgeon: [name], MD;  Location: [place] CV LAB;  Service: Cardiovascular;  Laterality: N/A;  . MENISCUS REPAIR     right knee  2000  . POLYPECTOMY  09/01/2015   Procedure: POLYPECTOMY;  Surgeon: Daneil Dolin,  MD;  [facility]: [facility];  Service: Endoscopy;;  colon  . RIGHT/LEFT HEART CATH AND CORONARY ANGIOGRAPHY N/A 02/19/2018   Procedure: RIGHT/LEFT HEART CATH AND CORONARY ANGIOGRAPHY;  Surgeon: [name], MD;  Location: [place] CV LAB;  Service: Cardiovascular;  Laterality: N/A;  . TEE WITHOUT CARDIOVERSION N/A 03/19/2018   Procedure: TRANSESOPHAGEAL ECHOCARDIOGRAM (TEE);  Surgeon: [name], MD;  Location: [place] CV LAB;  Service: Open Heart Surgery;  Laterality: N/A;  . TONSILLECTOMY Bilateral   . TRANSCATHETER AORTIC VALVE REPLACEMENT, TRANSFEMORAL N/A 03/19/2018   Procedure: TRANSCATHETER AORTIC VALVE REPLACEMENT, TRANSFEMORAL;  Surgeon: [name], MD;  Location: [place] CV LAB;  Service: Open Heart Surgery;  Laterality: N/A;    Current Medications: Current Meds  Medication Sig  . amLODipine (NORVASC) 5 MG tablet TAKE 1 TABLET BY MOUTH  DAILY  . amoxicillin (AMOXIL) 500 MG tablet Take 4 tablets (2,000 mg) one hour prior to all dental visits.  Marland Kitchen aspirin 81 MG chewable tablet Chew 81 mg by mouth daily.  Marland Kitchen atorvastatin (LIPITOR) 40 MG tablet TAKE 1 TABLET BY MOUTH  DAILY AT 6PM (Patient taking differently: Take 40 mg by mouth daily at 6 PM. )  . brimonidine (ALPHAGAN) 0.2 % ophthalmic solution Place 1 drop into both eyes 3 (three) times daily.   . calcium gluconate 500 MG tablet Take 500 mg by mouth daily.  . carbidopa-levodopa (SINEMET IR) 25-100 MG tablet TAKE 2 TABLETS BY MOUTH AT  6AM, 1 TAB AT 9AM, 2 TABS  AT 12PM, 1 TAB AT 3PM, 2  TABS AT 6PM, AND 1 TAB AT  9PM  . dorzolamide-timolol (COSOPT) 22.3-6.8 MG/ML ophthalmic solution Place 1 drop into both eyes 2 (two) times daily.   Marland Kitchen glucosamine-chondroitin 500-400 MG tablet Take 1 tablet by mouth daily.   Marland Kitchen latanoprost (XALATAN) 0.005 % ophthalmic solution Place 1 drop into both eyes at bedtime.   . metoprolol succinate (TOPROL-XL) 25 MG 24 hr tablet TAKE 1 TABLET BY MOUTH  DAILY  . Multiple Vitamin (MULTIVITAMIN)  tablet Take 1 tablet by mouth daily.  . nitroGLYCERIN (NITROSTAT) 0.4 MG SL tablet Place 1 tablet (0.4 mg total) under the tongue every 5 (five) minutes as needed.  . pantoprazole (PROTONIX) 20 MG tablet TAKE 1 TABLET BY MOUTH  DAILY  . pramipexole (MIRAPEX) 0.5 MG tablet Take 1 tablet (0.5 mg total) by mouth 3 (three) times daily.  . vitamin C (ASCORBIC ACID) 500 MG tablet Take 500 mg by mouth daily.     Allergies:   Patient has no known allergies.   Social History   Socioeconomic History  . Marital status: Widowed    Spouse name: Clinical research associate  . Number of children: 4  . Years of education: 82  . Highest education level: Not on file  Occupational History  . Occupation: Retired   Tobacco Use  . Smoking status: Former Smoker    Packs/day: 1.00    Types: Cigarettes, Pipe    Start date: 01/22/1951  Quit date: 01/21/1989    Years since quitting: 30.3  . Smokeless tobacco: Never Used  Substance and Sexual Activity  . Alcohol use: No    Alcohol/week: 0.0 standard drinks  . Drug use: No  . Sexual activity: Not on file  Other Topics Concern  . Not on file  Social History Narrative  . Not on file   Social Determinants of Health   Financial Resource Strain:   . Difficulty of Paying Living Expenses:   Food Insecurity:   . Worried About Charity fundraiser in the Last Year:   . Arboriculturist in the Last Year:   Transportation Needs:   . Film/video editor (Medical):   Marland Kitchen Lack of Transportation (Non-Medical):   Physical Activity:   . Days of Exercise per Week:   . Minutes of Exercise per Session:   Stress:   . Feeling of Stress :   Social Connections:   . Frequency of Communication with Friends and Family:   . Frequency of Social Gatherings with Friends and Family:   . Attends Religious Services:   . Active Member of Clubs or Organizations:   . Attends Archivist Meetings:   Marland Kitchen Marital Status:      Family History: The patient's family history includes Cancer in  his brother; Dementia in his mother; Stroke in his brother.  ROS:   Please see the history of present illness.    All other systems reviewed and are negative.  EKGs/Labs/Other Studies Reviewed:    The following studies were reviewed today: Cardiac Catheterization 02-19-2018: Conclusion    1st Diag lesion is 50% stenosed.  Ost 1st Mrg to 1st Mrg lesion is 30% stenosed.   1.  Severe RCA stenosis with subtotal occlusion of the distal RCA and collaterals from the left coronary artery supplying the PDA and PL branches 2.  Severe mid LAD stenosis with abnormal pressure wire analysis, treated successfully with PCI using a drug-eluting stent 3.  Patent left mainstem and left circumflex 4.  Severe aortic stenosis with mean transvalvular gradient 30 mmHg and peak gradient 42 mmHg  Recommendations: Dual antiplatelet therapy with aspirin and clopidogrel for at least 6 months.  Continue TAVR evaluation.  Medical therapy for residual CAD.     EKG:  EKG is not ordered today.    Recent Labs: 04/07/2019: ALT 6; BUN 20; Creatinine, Ser 1.02; Hemoglobin 14.3; Platelets 130; Potassium 4.1; Sodium 140  Recent Lipid Panel    Component Value Date/Time   CHOL 144 02/20/2018 0714   TRIG 68 02/20/2018 0714   HDL 37 (L) 02/20/2018 0714   CHOLHDL 3.9 02/20/2018 0714   VLDL 14 02/20/2018 0714   LDLCALC 93 02/20/2018 0714    Physical Exam:    VS:  BP (!) 110/54   Pulse 78   Ht 5\' 10"  (1.778 m)   Wt 185 lb (83.9 kg)   SpO2 96%   BMI 26.54 kg/m     Wt Readings from Last 3 Encounters:  05/05/19 185 lb (83.9 kg)  04/07/19 187 lb 8 oz (85 kg)  03/19/19 187 lb (84.8 kg)     GEN: Elderly male with parkinsonian features in no acute distress HEENT: Normal NECK: No JVD; No carotid bruits LYMPHATICS: No lymphadenopathy CARDIAC: RRR, no murmurs, rubs, gallops RESPIRATORY:  Clear to auscultation without rales, wheezing or rhonchi  ABDOMEN: Soft, non-tender, non-distended MUSCULOSKELETAL: Trace  bilateral pretibial edema; No deformity  SKIN: Warm and dry NEUROLOGIC:  Alert and  oriented x 3 PSYCHIATRIC:  Normal affect   ASSESSMENT:    1. Chronic diastolic CHF (congestive heart failure) ([place])   2. Bilateral leg edema    PLAN:    In order of problems listed above:  1. Overall appears stable on current medical therapy.  I suspect his leg swelling is more related to venous insufficiency and stasis, may also be complicated by use of amlodipine.. 2. For now, would continue current medical therapy.  It seems that his symptoms have improved with discontinuation of Mirapex.  He will discontinue furosemide as it is quite difficult for him to take because of frequent urination.  If he develops recurrent leg edema, he will go back on furosemide once daily for 3 days then will drop to 3 times per week dosing.  He will take potassium on days that he takes furosemide.  His son checks on him every day and will keep an eye on his leg swelling.   Medication Adjustments/Labs and Tests Ordered: Current medicines are reviewed at length with the patient today.  Concerns regarding medicines are outlined above.  No orders of the defined types were placed in this encounter.  No orders of the defined types were placed in this encounter.   Patient Instructions  Medication Instructions:  1) STOP LASIX AND POTASSIUM 2) If your leg swelling returns, resume Lasix and potassium (take together) for 3 consecutive days. Then take Monday Wednesdays, and Fridays. *If you need a refill on your cardiac medications before your next appointment, please call your pharmacy*  Follow-Up: Please see Dr. Bronson Ing back as planned around Christmas.    Signed, [name], MD  05/05/2019 4:49 PM    [place]

## 2019-05-06 DIAGNOSIS — E785 Hyperlipidemia, unspecified: Secondary | ICD-10-CM | POA: Diagnosis not present

## 2019-05-06 DIAGNOSIS — G20 Parkinson's disease: Secondary | ICD-10-CM | POA: Diagnosis not present

## 2019-05-06 DIAGNOSIS — I509 Heart failure, unspecified: Secondary | ICD-10-CM | POA: Diagnosis not present

## 2019-05-06 DIAGNOSIS — I110 Hypertensive heart disease with heart failure: Secondary | ICD-10-CM | POA: Diagnosis not present

## 2019-05-06 DIAGNOSIS — D329 Benign neoplasm of meninges, unspecified: Secondary | ICD-10-CM | POA: Diagnosis not present

## 2019-05-06 DIAGNOSIS — I499 Cardiac arrhythmia, unspecified: Secondary | ICD-10-CM | POA: Diagnosis not present

## 2019-05-09 DIAGNOSIS — H401134 Primary open-angle glaucoma, bilateral, indeterminate stage: Secondary | ICD-10-CM | POA: Diagnosis not present

## 2019-05-12 ENCOUNTER — Other Ambulatory Visit: Payer: Self-pay | Admitting: Physician Assistant

## 2019-05-13 DIAGNOSIS — I509 Heart failure, unspecified: Secondary | ICD-10-CM | POA: Diagnosis not present

## 2019-05-13 DIAGNOSIS — E785 Hyperlipidemia, unspecified: Secondary | ICD-10-CM | POA: Diagnosis not present

## 2019-05-13 DIAGNOSIS — I499 Cardiac arrhythmia, unspecified: Secondary | ICD-10-CM | POA: Diagnosis not present

## 2019-05-13 DIAGNOSIS — D329 Benign neoplasm of meninges, unspecified: Secondary | ICD-10-CM | POA: Diagnosis not present

## 2019-05-13 DIAGNOSIS — I110 Hypertensive heart disease with heart failure: Secondary | ICD-10-CM | POA: Diagnosis not present

## 2019-05-13 DIAGNOSIS — G20 Parkinson's disease: Secondary | ICD-10-CM | POA: Diagnosis not present

## 2019-05-18 DIAGNOSIS — I499 Cardiac arrhythmia, unspecified: Secondary | ICD-10-CM | POA: Diagnosis not present

## 2019-05-18 DIAGNOSIS — Z9181 History of falling: Secondary | ICD-10-CM | POA: Diagnosis not present

## 2019-05-18 DIAGNOSIS — Z9989 Dependence on other enabling machines and devices: Secondary | ICD-10-CM | POA: Diagnosis not present

## 2019-05-18 DIAGNOSIS — I509 Heart failure, unspecified: Secondary | ICD-10-CM | POA: Diagnosis not present

## 2019-05-18 DIAGNOSIS — E785 Hyperlipidemia, unspecified: Secondary | ICD-10-CM | POA: Diagnosis not present

## 2019-05-18 DIAGNOSIS — I110 Hypertensive heart disease with heart failure: Secondary | ICD-10-CM | POA: Diagnosis not present

## 2019-05-18 DIAGNOSIS — Z87891 Personal history of nicotine dependence: Secondary | ICD-10-CM | POA: Diagnosis not present

## 2019-05-18 DIAGNOSIS — G4739 Other sleep apnea: Secondary | ICD-10-CM | POA: Diagnosis not present

## 2019-05-18 DIAGNOSIS — K5900 Constipation, unspecified: Secondary | ICD-10-CM | POA: Diagnosis not present

## 2019-05-18 DIAGNOSIS — D329 Benign neoplasm of meninges, unspecified: Secondary | ICD-10-CM | POA: Diagnosis not present

## 2019-05-18 DIAGNOSIS — G20 Parkinson's disease: Secondary | ICD-10-CM | POA: Diagnosis not present

## 2019-05-18 DIAGNOSIS — R197 Diarrhea, unspecified: Secondary | ICD-10-CM | POA: Diagnosis not present

## 2019-05-19 DIAGNOSIS — I499 Cardiac arrhythmia, unspecified: Secondary | ICD-10-CM | POA: Diagnosis not present

## 2019-05-19 DIAGNOSIS — G20 Parkinson's disease: Secondary | ICD-10-CM | POA: Diagnosis not present

## 2019-05-19 DIAGNOSIS — I110 Hypertensive heart disease with heart failure: Secondary | ICD-10-CM | POA: Diagnosis not present

## 2019-05-19 DIAGNOSIS — I509 Heart failure, unspecified: Secondary | ICD-10-CM | POA: Diagnosis not present

## 2019-05-19 DIAGNOSIS — E785 Hyperlipidemia, unspecified: Secondary | ICD-10-CM | POA: Diagnosis not present

## 2019-05-19 DIAGNOSIS — D329 Benign neoplasm of meninges, unspecified: Secondary | ICD-10-CM | POA: Diagnosis not present

## 2019-05-29 ENCOUNTER — Telehealth: Payer: Self-pay | Admitting: Neurology

## 2019-05-29 DIAGNOSIS — E785 Hyperlipidemia, unspecified: Secondary | ICD-10-CM | POA: Diagnosis not present

## 2019-05-29 DIAGNOSIS — I499 Cardiac arrhythmia, unspecified: Secondary | ICD-10-CM | POA: Diagnosis not present

## 2019-05-29 DIAGNOSIS — D329 Benign neoplasm of meninges, unspecified: Secondary | ICD-10-CM | POA: Diagnosis not present

## 2019-05-29 DIAGNOSIS — G20 Parkinson's disease: Secondary | ICD-10-CM | POA: Diagnosis not present

## 2019-05-29 DIAGNOSIS — I509 Heart failure, unspecified: Secondary | ICD-10-CM | POA: Diagnosis not present

## 2019-05-29 DIAGNOSIS — I110 Hypertensive heart disease with heart failure: Secondary | ICD-10-CM | POA: Diagnosis not present

## 2019-05-29 NOTE — Telephone Encounter (Signed)
[name] w/[facility] has called for verbal orders for 1 time a week for 2 weeks. She can be reached at [phone]

## 2019-05-29 NOTE — Telephone Encounter (Signed)
I contacted physical therapist Salvatore Marvel and provided need VO.

## 2019-06-03 ENCOUNTER — Other Ambulatory Visit: Payer: Self-pay | Admitting: Neurology

## 2019-06-03 DIAGNOSIS — G20 Parkinson's disease: Secondary | ICD-10-CM

## 2019-06-05 DIAGNOSIS — E785 Hyperlipidemia, unspecified: Secondary | ICD-10-CM | POA: Diagnosis not present

## 2019-06-05 DIAGNOSIS — I110 Hypertensive heart disease with heart failure: Secondary | ICD-10-CM | POA: Diagnosis not present

## 2019-06-05 DIAGNOSIS — G20 Parkinson's disease: Secondary | ICD-10-CM | POA: Diagnosis not present

## 2019-06-05 DIAGNOSIS — D329 Benign neoplasm of meninges, unspecified: Secondary | ICD-10-CM | POA: Diagnosis not present

## 2019-06-05 DIAGNOSIS — I499 Cardiac arrhythmia, unspecified: Secondary | ICD-10-CM | POA: Diagnosis not present

## 2019-06-05 DIAGNOSIS — I509 Heart failure, unspecified: Secondary | ICD-10-CM | POA: Diagnosis not present

## 2019-06-10 DIAGNOSIS — D329 Benign neoplasm of meninges, unspecified: Secondary | ICD-10-CM | POA: Diagnosis not present

## 2019-06-10 DIAGNOSIS — G20 Parkinson's disease: Secondary | ICD-10-CM | POA: Diagnosis not present

## 2019-06-10 DIAGNOSIS — I499 Cardiac arrhythmia, unspecified: Secondary | ICD-10-CM | POA: Diagnosis not present

## 2019-06-10 DIAGNOSIS — I509 Heart failure, unspecified: Secondary | ICD-10-CM | POA: Diagnosis not present

## 2019-06-10 DIAGNOSIS — I110 Hypertensive heart disease with heart failure: Secondary | ICD-10-CM | POA: Diagnosis not present

## 2019-06-10 DIAGNOSIS — E785 Hyperlipidemia, unspecified: Secondary | ICD-10-CM | POA: Diagnosis not present

## 2019-06-12 DIAGNOSIS — D1801 Hemangioma of skin and subcutaneous tissue: Secondary | ICD-10-CM | POA: Diagnosis not present

## 2019-06-12 DIAGNOSIS — L570 Actinic keratosis: Secondary | ICD-10-CM | POA: Diagnosis not present

## 2019-06-12 DIAGNOSIS — Z85828 Personal history of other malignant neoplasm of skin: Secondary | ICD-10-CM | POA: Diagnosis not present

## 2019-06-12 DIAGNOSIS — L812 Freckles: Secondary | ICD-10-CM | POA: Diagnosis not present

## 2019-06-12 DIAGNOSIS — L821 Other seborrheic keratosis: Secondary | ICD-10-CM | POA: Diagnosis not present

## 2019-10-13 DIAGNOSIS — L570 Actinic keratosis: Secondary | ICD-10-CM | POA: Diagnosis not present

## 2019-10-13 DIAGNOSIS — L820 Inflamed seborrheic keratosis: Secondary | ICD-10-CM | POA: Diagnosis not present

## 2019-10-13 DIAGNOSIS — D1801 Hemangioma of skin and subcutaneous tissue: Secondary | ICD-10-CM | POA: Diagnosis not present

## 2019-10-13 DIAGNOSIS — Z85828 Personal history of other malignant neoplasm of skin: Secondary | ICD-10-CM | POA: Diagnosis not present

## 2019-10-13 DIAGNOSIS — D2262 Melanocytic nevi of left upper limb, including shoulder: Secondary | ICD-10-CM | POA: Diagnosis not present

## 2019-10-13 DIAGNOSIS — L821 Other seborrheic keratosis: Secondary | ICD-10-CM | POA: Diagnosis not present

## 2019-10-13 DIAGNOSIS — D692 Other nonthrombocytopenic purpura: Secondary | ICD-10-CM | POA: Diagnosis not present

## 2019-11-10 DIAGNOSIS — H401134 Primary open-angle glaucoma, bilateral, indeterminate stage: Secondary | ICD-10-CM | POA: Diagnosis not present

## 2019-11-20 DIAGNOSIS — Z23 Encounter for immunization: Secondary | ICD-10-CM | POA: Diagnosis not present

## 2019-11-23 ENCOUNTER — Encounter (HOSPITAL_COMMUNITY): Payer: Self-pay | Admitting: *Deleted

## 2019-11-23 ENCOUNTER — Emergency Department (HOSPITAL_COMMUNITY)
Admission: EM | Admit: 2019-11-23 | Discharge: 2019-11-23 | Disposition: A | Discharge status: Home / Self Care | Payer: Medicare Other | Attending: Emergency Medicine | Admitting: Emergency Medicine

## 2019-11-23 ENCOUNTER — Emergency Department (HOSPITAL_COMMUNITY): Payer: Medicare Other

## 2019-11-23 DIAGNOSIS — S6992XA Unspecified injury of left wrist, hand and finger(s), initial encounter: Secondary | ICD-10-CM | POA: Diagnosis present

## 2019-11-23 DIAGNOSIS — S61422A Laceration with foreign body of left hand, initial encounter: Secondary | ICD-10-CM | POA: Insufficient documentation

## 2019-11-23 DIAGNOSIS — W1839XA Other fall on same level, initial encounter: Secondary | ICD-10-CM | POA: Diagnosis not present

## 2019-11-23 DIAGNOSIS — W19XXXA Unspecified fall, initial encounter: Secondary | ICD-10-CM

## 2019-11-23 DIAGNOSIS — Z7982 Long term (current) use of aspirin: Secondary | ICD-10-CM | POA: Insufficient documentation

## 2019-11-23 DIAGNOSIS — Z85828 Personal history of other malignant neoplasm of skin: Secondary | ICD-10-CM | POA: Insufficient documentation

## 2019-11-23 DIAGNOSIS — I2510 Atherosclerotic heart disease of native coronary artery without angina pectoris: Secondary | ICD-10-CM | POA: Insufficient documentation

## 2019-11-23 DIAGNOSIS — I110 Hypertensive heart disease with heart failure: Secondary | ICD-10-CM | POA: Diagnosis not present

## 2019-11-23 DIAGNOSIS — S61412A Laceration without foreign body of left hand, initial encounter: Secondary | ICD-10-CM | POA: Diagnosis not present

## 2019-11-23 DIAGNOSIS — I5033 Acute on chronic diastolic (congestive) heart failure: Secondary | ICD-10-CM | POA: Diagnosis not present

## 2019-11-23 DIAGNOSIS — Z955 Presence of coronary angioplasty implant and graft: Secondary | ICD-10-CM | POA: Insufficient documentation

## 2019-11-23 DIAGNOSIS — M19042 Primary osteoarthritis, left hand: Secondary | ICD-10-CM | POA: Diagnosis not present

## 2019-11-23 DIAGNOSIS — G20 Parkinson's disease: Secondary | ICD-10-CM | POA: Diagnosis not present

## 2019-11-23 DIAGNOSIS — Z79899 Other long term (current) drug therapy: Secondary | ICD-10-CM | POA: Insufficient documentation

## 2019-11-23 DIAGNOSIS — Z87891 Personal history of nicotine dependence: Secondary | ICD-10-CM | POA: Insufficient documentation

## 2019-11-23 DIAGNOSIS — Y92009 Unspecified place in unspecified non-institutional (private) residence as the place of occurrence of the external cause: Secondary | ICD-10-CM | POA: Diagnosis not present

## 2019-11-23 NOTE — ED Triage Notes (Signed)
Fell at home, laceration to left hand

## 2019-11-23 NOTE — ED Provider Notes (Signed)
[facility] EMERGENCY DEPARTMENT Provider Note   CSN: [id number] Arrival date & time: 11/23/19  1355     History Chief Complaint  Patient presents with  . Fall    [name] is a 86 y.o. male.  HPI  Patient is an 86 year old gentleman with a history of CAD, HLD, HTN, Parkinson's disease, 7.1 cm meningioma followed by Dr. Vertell Limber  Patient is presenting today for mechanical fall that occurred when he turned and his legs became twisted underneath him and caused him to fall to the ground shattering the glass tumbler that he had in his right hand.  He had sudden onset of bleeding from his right hand.  He was evaluated by EMS prior to his daughter arriving on scene and his daughter was told that he had a laceration of his left hand which prompted them to come to the emergency department for evaluation.  Patient states he has no pain anywhere at this time.  He denies any head injury or loss of consciousness nausea or vomiting.  He states he has no neck pain either.  He states is and does not hurt currently.  He has it wrapped with bandage because there was some bleeding initially.  He denies any lightheadedness or dizziness.  No chest pain or shortness of breath.  No abdominal pain.  No other associated symptoms.  No aggravating mitigating factors.    Past Medical History:  Diagnosis Date  . Arthritis   . Brain mass    right lateral frontotemporal extra-axial mass 7.1 cm with typical features of meningioma 02/28/18 (to follow-up w/ neurosurgeon Dr. Vertell Limber)  . Cancer [facility])    h/o  skin  cancer  . Coronary artery disease    s/p DES to LAD (02/19/18)  . Ex-smoker   . GERD (gastroesophageal reflux disease)   . Hypercholesteremia   . Hypertension   . Lumbar stenosis   . OSA (obstructive sleep apnea)   . Pancreatic duct dilated   . Parkinson's disease ([place])   . S/P TAVR (transcatheter aortic valve replacement)   . Severe aortic stenosis     Patient Active Problem List   Diagnosis  Date Noted  . Acute on chronic diastolic heart failure ([place]) 03/20/2018  . S/P TAVR (transcatheter aortic valve replacement) 03/19/2018  . Coronary artery disease   . Pancreatic duct dilated   . Severe aortic stenosis 02/19/2018  . OSA (obstructive sleep apnea)   . History of colonic polyps   . Hx of adenomatous colonic polyps 01/04/2015  . Lumbar canal stenosis 12/04/2013  . Primary central sleep apnea 04/16/2012  . Orthostatic hypotension 04/16/2012  . Hereditary and idiopathic peripheral neuropathy 04/16/2012  . Parkinson's 04/16/2012  . COLONIC POLYPS, ADENOMATOUS 11/19/2008  . GERD 11/19/2008  . GI BLEEDING 11/19/2008  . HLD (hyperlipidemia) 07/03/2008  . Essential hypertension 07/03/2008    Past Surgical History:  Procedure Laterality Date  . APPENDECTOMY    . BACK SURGERY  11/2013  . COLONOSCOPY  11/17/2008   Rourk: Sessile polyp in the cecum status post saline-assisted piecemeal polypectomy, resolution clipping and epinephrine injection therapy performed. Shallow sigmoid diverticula.. Tubular adenoma  . COLONOSCOPY  11/19/2008   Fields: Large amount of liquid blood with clots seen throughout the colon. Previous [place] resolution clip remained in place. Purple spot seen in the lateral aspect polypectomy site, 3 resolution clips placed here. 6 mm sessile ascending colon polyp seen and not manipulated.  . COLONOSCOPY  11/25/2008   Rourk: Blood-tinged colonic effluent, suspect  recurrent post polypectomy bleeding status post placement of 3 clips on the polypectomy site on top of the 3 clips that already existed, one had fallen off since last procedure. One small polyp distal to the cecum not manipulated  . COLONOSCOPY  12/06/2009   Rourk: Internal hemorrhoids, scattered left-sided diverticula, cecal polyps, one snared and subsequently clipped, 2 adjacent diminutive polyps ablated. tubular adenoma  . COLONOSCOPY N/A 01/21/2015   Dr. Gala Romney: 1.5 cm carpet-type polyp just distal to the  ileocecal valve removed piecemeal fashion and ablated. Small polyp in the rectum. Pathology-tubular adenomas  . COLONOSCOPY N/A 09/01/2015   Procedure: COLONOSCOPY;  Surgeon: Daneil Dolin, MD;  [facility];  Service: Endoscopy;  Laterality: N/A;  845  . CORONARY STENT INTERVENTION N/A 02/19/2018   Procedure: CORONARY STENT INTERVENTION;  Surgeon: [name], MD;  Location: [place] CV LAB;  Service: Cardiovascular;  Laterality: N/A;  . EYE SURGERY     bilateral  cataracts  . INTRAVASCULAR PRESSURE WIRE/FFR STUDY N/A 02/19/2018   Procedure: INTRAVASCULAR PRESSURE WIRE/FFR STUDY;  Surgeon: [name], MD;  Location: [place] CV LAB;  Service: Cardiovascular;  Laterality: N/A;  . MENISCUS REPAIR     right knee  2000  . POLYPECTOMY  09/01/2015   Procedure: POLYPECTOMY;  Surgeon: Daneil Dolin, MD;  [facility];  Service: Endoscopy;;  colon  . RIGHT/LEFT HEART CATH AND CORONARY ANGIOGRAPHY N/A 02/19/2018   Procedure: RIGHT/LEFT HEART CATH AND CORONARY ANGIOGRAPHY;  Surgeon: [name], MD;  Location: [place] [facility] LAB;  Service: Cardiovascular;  Laterality: N/A;  . TEE WITHOUT CARDIOVERSION N/A 03/19/2018   Procedure: TRANSESOPHAGEAL ECHOCARDIOGRAM (TEE);  Surgeon: [name], MD;  Location: [place] [facility] LAB;  Service: Open Heart Surgery;  Laterality: N/A;  . TONSILLECTOMY Bilateral   . TRANSCATHETER AORTIC VALVE REPLACEMENT, TRANSFEMORAL N/A 03/19/2018   Procedure: TRANSCATHETER AORTIC VALVE REPLACEMENT, TRANSFEMORAL;  Surgeon: [name], MD;  Location: [place] CV LAB;  Service: Open Heart Surgery;  Laterality: N/A;       Family History  Problem Relation Age of Onset  . Dementia Mother   . Cancer Brother   . Stroke Brother     Social History   Tobacco Use  . Smoking status: Former Smoker    Packs/day: 1.00    Types: Cigarettes, Pipe    Start date: 01/22/1951    Quit date: 01/21/1989    Years since quitting: 30.8  . Smokeless  tobacco: Never Used  Vaping Use  . Vaping Use: Never used  Substance Use Topics  . Alcohol use: No    Alcohol/week: 0.0 standard drinks  . Drug use: No    Home Medications Prior to Admission medications   Medication Sig Start Date End Date Taking? Authorizing Provider  amLODipine (NORVASC) 5 MG tablet TAKE 1 TABLET BY MOUTH  DAILY 01/22/19   Herminio Commons, MD  amoxicillin (AMOXIL) 500 MG tablet Take 4 tablets (2,000 mg) one hour prior to all dental visits. 03/28/18   Eileen Stanford, PA-C  aspirin 81 MG chewable tablet Chew 81 mg by mouth daily.    [provider]  atorvastatin (LIPITOR) 40 MG tablet TAKE 1 TABLET BY MOUTH  DAILY AT 6PM Patient taking differently: Take 40 mg by mouth daily at 6 PM.  01/21/19   Herminio Commons, MD  brimonidine (ALPHAGAN) 0.2 % ophthalmic solution Place 1 drop into both eyes 3 (three) times daily.  12/04/17   [provider]  calcium  gluconate 500 MG tablet Take 500 mg by mouth daily.    [provider]  carbidopa-levodopa (SINEMET IR) 25-100 MG tablet TAKE 2 TABLETS BY MOUTH AT  6AM, 1 TABLET AT 9AM, 2  TABLETS AT 12PM, 1 TABLET  AT 3PM, 2 TABLETS AT 6PM,  AND 1 TABLET  AT 9PM 06/04/19   Star Age, MD  dorzolamide-timolol (COSOPT) 22.3-6.8 MG/ML ophthalmic solution Place 1 drop into both eyes 2 (two) times daily.  12/04/17   [provider]  glucosamine-chondroitin 500-400 MG tablet Take 1 tablet by mouth daily.     [provider]  latanoprost (XALATAN) 0.005 % ophthalmic solution Place 1 drop into both eyes at bedtime.  01/05/18   [provider]  losartan (COZAAR) 100 MG tablet TAKE 1 TABLET BY MOUTH  DAILY 05/12/19   Eileen Stanford, PA-C  metoprolol succinate (TOPROL-XL) 25 MG 24 hr tablet TAKE 1 TABLET BY MOUTH  DAILY 01/22/19   Herminio Commons, MD  Multiple Vitamin (MULTIVITAMIN) tablet Take 1 tablet by mouth daily.    [provider]  nitroGLYCERIN (NITROSTAT) 0.4 MG SL  tablet Place 1 tablet (0.4 mg total) under the tongue every 5 (five) minutes as needed. 02/20/18   Bhagat, Crista Luria, PA  pantoprazole (PROTONIX) 20 MG tablet TAKE 1 TABLET BY MOUTH  DAILY 02/27/19   Herminio Commons, MD  pramipexole (MIRAPEX) 0.5 MG tablet Take 1 tablet (0.5 mg total) by mouth 3 (three) times daily. 04/17/19   Star Age, MD  vitamin C (ASCORBIC ACID) 500 MG tablet Take 500 mg by mouth daily.    [provider]    Allergies    Patient has no known allergies.  Review of Systems   Review of Systems  Constitutional: Negative for chills and fever.  HENT: Negative for congestion.   Eyes: Negative for pain.  Respiratory: Negative for cough and shortness of breath.   Cardiovascular: Negative for chest pain and leg swelling.  Gastrointestinal: Negative for abdominal pain and vomiting.  Genitourinary: Negative for dysuria.  Musculoskeletal: Negative for myalgias.  Skin: Negative for rash.       Left hand laceration  Neurological: Negative for dizziness and headaches.    Physical Exam Updated Vital Signs BP (!) 163/95   Pulse 88   Temp 98 F (36.7 C)   Resp 18   SpO2 91%   Physical Exam Vitals and nursing note reviewed.  Constitutional:      General: He is not in acute distress.    Appearance: Normal appearance. He is not ill-appearing.  HENT:     Head: Normocephalic and atraumatic.  Eyes:     General: No scleral icterus.       Right eye: No discharge.        Left eye: No discharge.     Conjunctiva/sclera: Conjunctivae normal.  Pulmonary:     Effort: Pulmonary effort is normal.     Breath sounds: No stridor.  Musculoskeletal:     Comments: No bony tenderness over joints or long bones of the upper and lower extremities.     No neck or back midline tenderness, step-off, deformity, or bruising. Able to turn head left and right 45 degrees without difficulty.  Full range of motion of upper and lower extremity joints shown after palpation was  conducted; with 5/5 symmetrical strength in upper and lower extremities. No chest wall tenderness, no facial or cranial tenderness.   Patient has intact sensation grossly in lower and upper extremities. Intact  patellar and ankle reflexes. Patient able to ambulate without difficulty.  Radial and DP pulses palpated BL.   Skin:    General: Skin is warm and dry.     Capillary Refill: Capillary refill takes less than 2 seconds.     Comments: 2x small, approximately 1-2 millimeter puncture wounds to the left palm over the thenar eminence.  Full range of motion of thumb with flexion/extension. No evidence of foreign bodies.  Extensively cleaned by myself.  Neurological:     Mental Status: He is alert and oriented to person, place, and time. Mental status is at baseline.     ED Results / Procedures / Treatments   Labs (all labs ordered are listed, but only abnormal results are displayed) Labs Reviewed - No data to display  EKG None  Radiology DG Hand Complete Left  Result Date: 11/23/2019 CLINICAL DATA:  [place], laceration, concern for foreign body EXAM: LEFT HAND - COMPLETE 3+ VIEW COMPARISON:  None. FINDINGS: Frontal, oblique, and lateral views of the left hand are obtained. No fracture, subluxation, or dislocation. Mild diffuse osteoarthritis throughout the interphalangeal joints. Soft tissues are unremarkable. There is a punctate 1 mm radiopaque foreign body in the soft tissues along the palmar radial aspect of the second metacarpophalangeal joint. Please correlate with site of soft tissue injury no other radiopaque foreign bodies. IMPRESSION: 1. Punctate 1 mm radiopaque foreign body in the soft tissues adjacent to the second metacarpophalangeal joint as above. 2. Osteoarthritis.  No acute fracture Electronically Signed   By: Randa Ngo M.D.   On: 11/23/2019 16:28    Procedures Procedures (including critical care time)  Medications Ordered in ED Medications - No data to display  ED  Course  I have reviewed the triage vital signs and the nursing notes.  Pertinent labs & imaging results that were available during my care of the patient were reviewed by me and considered in my medical decision making (see chart for details).  Patient is an 86 year old male presented today after he broke a glass in his hand while falling to the ground.  He denies any injuries while falling to the ground denies any head injury, loss of consciousness, nausea vomiting or other concerning symptoms for head injury.  He has no bony tenderness on physical exam and has a completely reassuring physical exam from a trauma standpoint.  His vital signs are also within normal limits apart from mild hypertension.  Significantly he does have 2 tiny lacerations to the hand over the thenar eminence which are without any evidence of foreign body.  X-ray obtained to evaluate for any possibility of this.  Clinical Course as of Nov 23 1735  Sun Nov 23, 2019  1542 Last tetanus was updated 02/28/2018   [WF]    Clinical Course User Index [WF] Tedd Sias, [place]   I discussed this case with my attending physician who cosigned this note including patient's presenting symptoms, physical exam, and planned diagnostics and interventions. Attending physician stated agreement with plan or made changes to plan which were implemented.   Patient discharged with close return precautions.  He will have wound checked on by his internal medicine/PCP Dr.  [place] shows small radiolucent debris at the left second MCP however there is no evidence of foreign body on my examination.  Shared decision-making conversation with patient and wife they are agreeable with discharge and follow-up with PCP and monitoring.  No negation for aggressive retrieval at this time.    MDM Rules/Calculators/A&P  We will discharge home at this time.  No further concerns.  I did have a discussion with wife and husband/patient about  possibility of increasing home health/considering another living situation as patient is clearly going to increase her risk of falls this is Parkinson's disease progresses.  Final Clinical Impression(s) / ED Diagnoses Final diagnoses:  Laceration of left hand with foreign body, initial encounter  Fall, initial encounter    Rx / DC Orders ED Discharge Orders    None       Tedd Sias, [place] 11/23/19 1738    Milton Ferguson, MD 11/25/19 1551

## 2019-11-23 NOTE — Discharge Instructions (Signed)
Please follow-up with your primary care doctor for a wound checks as needed.  Plan plenty of water.  You may use Neosporin or bacitracin or petroleum jelly to the area to help keep the area lubricated while it is healing.  Please wash with warm soapy water to keep clean.  You may dab dry.  Please return to the ER for any new or concerning symptoms.

## 2019-11-23 NOTE — ED Notes (Signed)
Pt family took pts off of blood pressure cuff and pulse ox

## 2019-12-08 DIAGNOSIS — H401134 Primary open-angle glaucoma, bilateral, indeterminate stage: Secondary | ICD-10-CM | POA: Diagnosis not present

## 2019-12-08 DIAGNOSIS — Z961 Presence of intraocular lens: Secondary | ICD-10-CM | POA: Diagnosis not present

## 2020-01-02 ENCOUNTER — Other Ambulatory Visit: Payer: Self-pay

## 2020-01-06 ENCOUNTER — Other Ambulatory Visit: Payer: Self-pay

## 2020-01-06 ENCOUNTER — Other Ambulatory Visit: Payer: Self-pay | Admitting: *Deleted

## 2020-01-06 ENCOUNTER — Ambulatory Visit (HOSPITAL_COMMUNITY)
Admission: RE | Admit: 2020-01-06 | Discharge: 2020-01-06 | Disposition: A | Discharge status: Home / Self Care | Payer: Medicare Other | Source: Ambulatory Visit | Attending: Internal Medicine | Admitting: Internal Medicine

## 2020-01-06 ENCOUNTER — Other Ambulatory Visit (HOSPITAL_COMMUNITY): Payer: Self-pay | Admitting: Internal Medicine

## 2020-01-06 DIAGNOSIS — R059 Cough, unspecified: Secondary | ICD-10-CM | POA: Diagnosis not present

## 2020-01-06 DIAGNOSIS — R1310 Dysphagia, unspecified: Secondary | ICD-10-CM | POA: Diagnosis not present

## 2020-01-06 DIAGNOSIS — J90 Pleural effusion, not elsewhere classified: Secondary | ICD-10-CM | POA: Diagnosis not present

## 2020-01-06 DIAGNOSIS — R051 Acute cough: Secondary | ICD-10-CM | POA: Diagnosis not present

## 2020-01-06 MED ORDER — AMLODIPINE BESYLATE 5 MG PO TABS: 5.0000 mg | ORAL_TABLET | Freq: Every day | ORAL | 0 refills | Status: AC

## 2020-01-06 MED ORDER — ATORVASTATIN CALCIUM 40 MG PO TABS: ORAL_TABLET | ORAL | 0 refills | Status: DC

## 2020-01-06 MED ORDER — METOPROLOL SUCCINATE ER 25 MG PO TB24: 25.0000 mg | ORAL_TABLET | Freq: Every day | ORAL | 0 refills | Status: AC

## 2020-01-13 ENCOUNTER — Observation Stay (HOSPITAL_COMMUNITY): Payer: Medicare Other

## 2020-01-13 ENCOUNTER — Emergency Department (HOSPITAL_COMMUNITY): Payer: Medicare Other

## 2020-01-13 ENCOUNTER — Encounter (HOSPITAL_COMMUNITY): Payer: Self-pay | Admitting: Emergency Medicine

## 2020-01-13 ENCOUNTER — Inpatient Hospital Stay (HOSPITAL_COMMUNITY)
Admission: EM | Admit: 2020-01-13 | Discharge: 2020-01-19 | DRG: 175 | Disposition: A | Discharge status: Hospice | Payer: Medicare Other | Attending: Family Medicine | Admitting: Family Medicine

## 2020-01-13 ENCOUNTER — Other Ambulatory Visit: Payer: Self-pay

## 2020-01-13 DIAGNOSIS — Z86011 Personal history of benign neoplasm of the brain: Secondary | ICD-10-CM

## 2020-01-13 DIAGNOSIS — S40029A Contusion of unspecified upper arm, initial encounter: Secondary | ICD-10-CM | POA: Diagnosis present

## 2020-01-13 DIAGNOSIS — I2699 Other pulmonary embolism without acute cor pulmonale: Secondary | ICD-10-CM | POA: Diagnosis not present

## 2020-01-13 DIAGNOSIS — Z6826 Body mass index (BMI) 26.0-26.9, adult: Secondary | ICD-10-CM

## 2020-01-13 DIAGNOSIS — E785 Hyperlipidemia, unspecified: Secondary | ICD-10-CM | POA: Diagnosis present

## 2020-01-13 DIAGNOSIS — G20A1 Parkinson's disease without dyskinesia, without mention of fluctuations: Secondary | ICD-10-CM | POA: Diagnosis present

## 2020-01-13 DIAGNOSIS — G20 Parkinson's disease: Secondary | ICD-10-CM | POA: Diagnosis present

## 2020-01-13 DIAGNOSIS — Z85828 Personal history of other malignant neoplasm of skin: Secondary | ICD-10-CM

## 2020-01-13 DIAGNOSIS — I480 Paroxysmal atrial fibrillation: Secondary | ICD-10-CM | POA: Diagnosis present

## 2020-01-13 DIAGNOSIS — N179 Acute kidney failure, unspecified: Secondary | ICD-10-CM | POA: Diagnosis present

## 2020-01-13 DIAGNOSIS — I2510 Atherosclerotic heart disease of native coronary artery without angina pectoris: Secondary | ICD-10-CM | POA: Diagnosis present

## 2020-01-13 DIAGNOSIS — Z79899 Other long term (current) drug therapy: Secondary | ICD-10-CM

## 2020-01-13 DIAGNOSIS — E876 Hypokalemia: Secondary | ICD-10-CM | POA: Diagnosis present

## 2020-01-13 DIAGNOSIS — Z7982 Long term (current) use of aspirin: Secondary | ICD-10-CM

## 2020-01-13 DIAGNOSIS — I2609 Other pulmonary embolism with acute cor pulmonale: Secondary | ICD-10-CM

## 2020-01-13 DIAGNOSIS — I4891 Unspecified atrial fibrillation: Secondary | ICD-10-CM | POA: Diagnosis present

## 2020-01-13 DIAGNOSIS — K449 Diaphragmatic hernia without obstruction or gangrene: Secondary | ICD-10-CM | POA: Diagnosis not present

## 2020-01-13 DIAGNOSIS — G3184 Mild cognitive impairment, so stated: Secondary | ICD-10-CM | POA: Diagnosis present

## 2020-01-13 DIAGNOSIS — I714 Abdominal aortic aneurysm, without rupture: Secondary | ICD-10-CM | POA: Diagnosis not present

## 2020-01-13 DIAGNOSIS — R0602 Shortness of breath: Secondary | ICD-10-CM

## 2020-01-13 DIAGNOSIS — I110 Hypertensive heart disease with heart failure: Secondary | ICD-10-CM | POA: Diagnosis present

## 2020-01-13 DIAGNOSIS — Z87891 Personal history of nicotine dependence: Secondary | ICD-10-CM

## 2020-01-13 DIAGNOSIS — G939 Disorder of brain, unspecified: Secondary | ICD-10-CM | POA: Diagnosis present

## 2020-01-13 DIAGNOSIS — N401 Enlarged prostate with lower urinary tract symptoms: Secondary | ICD-10-CM | POA: Diagnosis present

## 2020-01-13 DIAGNOSIS — N138 Other obstructive and reflux uropathy: Secondary | ICD-10-CM | POA: Diagnosis not present

## 2020-01-13 DIAGNOSIS — I350 Nonrheumatic aortic (valve) stenosis: Secondary | ICD-10-CM

## 2020-01-13 DIAGNOSIS — I10 Essential (primary) hypertension: Secondary | ICD-10-CM | POA: Diagnosis present

## 2020-01-13 DIAGNOSIS — Z955 Presence of coronary angioplasty implant and graft: Secondary | ICD-10-CM

## 2020-01-13 DIAGNOSIS — R509 Fever, unspecified: Secondary | ICD-10-CM

## 2020-01-13 DIAGNOSIS — Z823 Family history of stroke: Secondary | ICD-10-CM

## 2020-01-13 DIAGNOSIS — Z8719 Personal history of other diseases of the digestive system: Secondary | ICD-10-CM

## 2020-01-13 DIAGNOSIS — G9341 Metabolic encephalopathy: Secondary | ICD-10-CM | POA: Diagnosis not present

## 2020-01-13 DIAGNOSIS — Z20822 Contact with and (suspected) exposure to covid-19: Secondary | ICD-10-CM | POA: Diagnosis not present

## 2020-01-13 DIAGNOSIS — I5032 Chronic diastolic (congestive) heart failure: Secondary | ICD-10-CM | POA: Diagnosis not present

## 2020-01-13 DIAGNOSIS — R310 Gross hematuria: Secondary | ICD-10-CM | POA: Diagnosis present

## 2020-01-13 DIAGNOSIS — G9389 Other specified disorders of brain: Secondary | ICD-10-CM

## 2020-01-13 DIAGNOSIS — R1310 Dysphagia, unspecified: Secondary | ICD-10-CM

## 2020-01-13 DIAGNOSIS — R0902 Hypoxemia: Secondary | ICD-10-CM | POA: Diagnosis not present

## 2020-01-13 DIAGNOSIS — H409 Unspecified glaucoma: Secondary | ICD-10-CM | POA: Diagnosis present

## 2020-01-13 DIAGNOSIS — E7800 Pure hypercholesterolemia, unspecified: Secondary | ICD-10-CM | POA: Diagnosis present

## 2020-01-13 DIAGNOSIS — R197 Diarrhea, unspecified: Secondary | ICD-10-CM | POA: Diagnosis not present

## 2020-01-13 DIAGNOSIS — R630 Anorexia: Secondary | ICD-10-CM | POA: Diagnosis present

## 2020-01-13 DIAGNOSIS — Z952 Presence of prosthetic heart valve: Secondary | ICD-10-CM

## 2020-01-13 DIAGNOSIS — X58XXXA Exposure to other specified factors, initial encounter: Secondary | ICD-10-CM | POA: Diagnosis present

## 2020-01-13 DIAGNOSIS — J189 Pneumonia, unspecified organism: Secondary | ICD-10-CM | POA: Diagnosis present

## 2020-01-13 DIAGNOSIS — K219 Gastro-esophageal reflux disease without esophagitis: Secondary | ICD-10-CM | POA: Diagnosis present

## 2020-01-13 DIAGNOSIS — Z515 Encounter for palliative care: Secondary | ICD-10-CM

## 2020-01-13 DIAGNOSIS — R29818 Other symptoms and signs involving the nervous system: Secondary | ICD-10-CM | POA: Diagnosis not present

## 2020-01-13 DIAGNOSIS — R7989 Other specified abnormal findings of blood chemistry: Secondary | ICD-10-CM | POA: Diagnosis present

## 2020-01-13 DIAGNOSIS — E860 Dehydration: Secondary | ICD-10-CM | POA: Diagnosis present

## 2020-01-13 DIAGNOSIS — R778 Other specified abnormalities of plasma proteins: Secondary | ICD-10-CM | POA: Diagnosis present

## 2020-01-13 DIAGNOSIS — G4733 Obstructive sleep apnea (adult) (pediatric): Secondary | ICD-10-CM | POA: Diagnosis present

## 2020-01-13 HISTORY — DX: Unspecified glaucoma: H40.9

## 2020-01-13 LAB — COMPREHENSIVE METABOLIC PANEL
ALT: 6 U/L (ref 0–44)
AST: 17 U/L (ref 15–41)
Albumin: 3.6 g/dL (ref 3.5–5.0)
Alkaline Phosphatase: 71 U/L (ref 38–126)
Anion gap: 14 (ref 5–15)
BUN: 65 mg/dL — ABNORMAL HIGH (ref 8–23)
CO2: 20 mmol/L — ABNORMAL LOW (ref 22–32)
Calcium: 8.9 mg/dL (ref 8.9–10.3)
Chloride: 101 mmol/L (ref 98–111)
Creatinine, Ser: 6.19 mg/dL — ABNORMAL HIGH (ref 0.61–1.24)
GFR, Estimated: 8 mL/min — ABNORMAL LOW (ref >60)
Glucose, Bld: 141 mg/dL — ABNORMAL HIGH (ref 70–99)
Potassium: 4.3 mmol/L (ref 3.5–5.1)
Sodium: 135 mmol/L (ref 135–145)
Total Bilirubin: 1.1 mg/dL (ref 0.3–1.2)
Total Protein: 6.7 g/dL (ref 6.5–8.1)

## 2020-01-13 LAB — CBC WITH DIFFERENTIAL/PLATELET
Abs Immature Granulocytes: 0.08 10*3/uL — ABNORMAL HIGH (ref 0.00–0.07)
Basophils Absolute: 0 10*3/uL (ref 0.0–0.1)
Basophils Relative: 0 %
Eosinophils Absolute: 0.2 10*3/uL (ref 0.0–0.5)
Eosinophils Relative: 1 %
HCT: 44.4 % (ref 39.0–52.0)
Hemoglobin: 15.4 g/dL (ref 13.0–17.0)
Immature Granulocytes: 1 %
Lymphocytes Relative: 7 %
Lymphs Abs: 1.2 10*3/uL (ref 0.7–4.0)
MCH: 35.2 pg — ABNORMAL HIGH (ref 26.0–34.0)
MCHC: 34.7 g/dL (ref 30.0–36.0)
MCV: 101.4 fL — ABNORMAL HIGH (ref 80.0–100.0)
Monocytes Absolute: 1.2 10*3/uL — ABNORMAL HIGH (ref 0.1–1.0)
Monocytes Relative: 7 %
Neutro Abs: 15.1 10*3/uL — ABNORMAL HIGH (ref 1.7–7.7)
Neutrophils Relative %: 84 %
Platelets: 134 10*3/uL — ABNORMAL LOW (ref 150–400)
RBC: 4.38 MIL/uL (ref 4.22–5.81)
RDW: 14 % (ref 11.5–15.5)
WBC: 17.8 10*3/uL — ABNORMAL HIGH (ref 4.0–10.5)
nRBC: 0 % (ref 0.0–0.2)

## 2020-01-13 LAB — URINALYSIS, ROUTINE W REFLEX MICROSCOPIC
Bacteria, UA: NONE SEEN
Bilirubin Urine: NEGATIVE
Glucose, UA: NEGATIVE mg/dL
Ketones, ur: 5 mg/dL — AB
Leukocytes,Ua: NEGATIVE
Nitrite: NEGATIVE
Protein, ur: 30 mg/dL — AB
RBC / HPF: >50 RBC/hpf — ABNORMAL HIGH (ref 0–5)
Specific Gravity, Urine: 1.019 (ref 1.005–1.030)
pH: 5 (ref 5.0–8.0)

## 2020-01-13 LAB — RENAL FUNCTION PANEL
Albumin: 3.2 g/dL — ABNORMAL LOW (ref 3.5–5.0)
Anion gap: 11 (ref 5–15)
BUN: 52 mg/dL — ABNORMAL HIGH (ref 8–23)
CO2: 24 mmol/L (ref 22–32)
Calcium: 8.7 mg/dL — ABNORMAL LOW (ref 8.9–10.3)
Chloride: 102 mmol/L (ref 98–111)
Creatinine, Ser: 3.3 mg/dL — ABNORMAL HIGH (ref 0.61–1.24)
GFR, Estimated: 17 mL/min — ABNORMAL LOW (ref >60)
Glucose, Bld: 123 mg/dL — ABNORMAL HIGH (ref 70–99)
Phosphorus: 3.6 mg/dL (ref 2.5–4.6)
Potassium: 3.7 mmol/L (ref 3.5–5.1)
Sodium: 137 mmol/L (ref 135–145)

## 2020-01-13 LAB — D-DIMER, QUANTITATIVE: D-Dimer, Quant: 14.02 ug/mL-FEU — ABNORMAL HIGH (ref 0.00–0.50)

## 2020-01-13 LAB — RESP PANEL BY RT-PCR (FLU A&B, COVID) ARPGX2
Influenza A by PCR: NEGATIVE
Influenza B by PCR: NEGATIVE
SARS Coronavirus 2 by RT PCR: NEGATIVE

## 2020-01-13 LAB — TROPONIN I (HIGH SENSITIVITY)
Troponin I (High Sensitivity): 115 ng/L (ref <18)
Troponin I (High Sensitivity): 123 ng/L (ref <18)

## 2020-01-13 LAB — BRAIN NATRIURETIC PEPTIDE: B Natriuretic Peptide: 240 pg/mL — ABNORMAL HIGH (ref 0.0–100.0)

## 2020-01-13 LAB — TRIGLYCERIDES: Triglycerides: 88 mg/dL (ref <150)

## 2020-01-13 LAB — LACTATE DEHYDROGENASE: LDH: 224 U/L — ABNORMAL HIGH (ref 98–192)

## 2020-01-13 LAB — C-REACTIVE PROTEIN: CRP: 16.9 mg/dL — ABNORMAL HIGH (ref <1.0)

## 2020-01-13 LAB — FERRITIN: Ferritin: 173 ng/mL (ref 24–336)

## 2020-01-13 LAB — LACTIC ACID, PLASMA
Lactic Acid, Venous: 1.8 mmol/L (ref 0.5–1.9)
Lactic Acid, Venous: 1.2 mmol/L (ref 0.5–1.9)

## 2020-01-13 LAB — FIBRINOGEN: Fibrinogen: 552 mg/dL — ABNORMAL HIGH (ref 210–475)

## 2020-01-13 LAB — PROCALCITONIN: Procalcitonin: 0.19 ng/mL

## 2020-01-13 MED ORDER — ADULT MULTIVITAMIN W/MINERALS CH
1.0000 | ORAL_TABLET | Freq: Every day | ORAL | Status: DC
  Administered 2020-01-14 – 2020-01-18 (×5): 1 via ORAL
  Filled 2020-01-13 (×5): qty 1

## 2020-01-13 MED ORDER — CARBIDOPA-LEVODOPA 25-100 MG PO TABS
1.0000 | ORAL_TABLET | Freq: Three times a day (TID) | ORAL | Status: DC
  Filled 2020-01-13 (×5): qty 1

## 2020-01-13 MED ORDER — HEPARIN (PORCINE) 25000 UT/250ML-% IV SOLN
1850.0000 [IU]/h | INTRAVENOUS | Status: DC
  Administered 2020-01-13: 19:00:00 1300 [IU]/h via INTRAVENOUS
  Administered 2020-01-14: 14:00:00 1550 [IU]/h via INTRAVENOUS
  Administered 2020-01-15 – 2020-01-16 (×2): 1700 [IU]/h via INTRAVENOUS
  Administered 2020-01-17: 1750 [IU]/h via INTRAVENOUS
  Administered 2020-01-17: 1700 [IU]/h via INTRAVENOUS
  Administered 2020-01-18: 1850 [IU]/h via INTRAVENOUS
  Filled 2020-01-13 (×9): qty 250

## 2020-01-13 MED ORDER — CARBIDOPA-LEVODOPA 25-100 MG PO TABS: 2.0000 | ORAL_TABLET | Freq: Three times a day (TID) | ORAL | Status: DC

## 2020-01-13 MED ORDER — PRAMIPEXOLE DIHYDROCHLORIDE 1 MG PO TABS
0.5000 mg | ORAL_TABLET | Freq: Three times a day (TID) | ORAL | Status: DC
  Administered 2020-01-14 – 2020-01-16 (×7): 0.5 mg via ORAL
  Filled 2020-01-13: qty 1
  Filled 2020-01-13: qty 2
  Filled 2020-01-13 (×5): qty 1
  Filled 2020-01-13: qty 2
  Filled 2020-01-13: qty 1
  Filled 2020-01-13: qty 2

## 2020-01-13 MED ORDER — LACTATED RINGERS IV SOLN: INTRAVENOUS | Status: DC

## 2020-01-13 MED ORDER — ONDANSETRON HCL 4 MG PO TABS: 4.0000 mg | ORAL_TABLET | Freq: Four times a day (QID) | ORAL | Status: DC | PRN

## 2020-01-13 MED ORDER — METOPROLOL SUCCINATE ER 25 MG PO TB24
25.0000 mg | ORAL_TABLET | Freq: Every day | ORAL | Status: DC
  Administered 2020-01-13 – 2020-01-17 (×5): 25 mg via ORAL
  Filled 2020-01-13 (×5): qty 1

## 2020-01-13 MED ORDER — SODIUM CHLORIDE 0.9% FLUSH
3.0000 mL | Freq: Two times a day (BID) | INTRAVENOUS | Status: DC
  Administered 2020-01-13 – 2020-01-18 (×7): 3 mL via INTRAVENOUS

## 2020-01-13 MED ORDER — ACETAMINOPHEN 325 MG PO TABS
650.0000 mg | ORAL_TABLET | Freq: Four times a day (QID) | ORAL | Status: DC | PRN
  Administered 2020-01-15 (×2): 650 mg via ORAL
  Filled 2020-01-13 (×2): qty 2

## 2020-01-13 MED ORDER — SODIUM CHLORIDE 0.9% FLUSH: 3.0000 mL | INTRAVENOUS | Status: DC | PRN

## 2020-01-13 MED ORDER — LATANOPROST 0.005 % OP SOLN
1.0000 [drp] | Freq: Every day | OPHTHALMIC | Status: DC
  Administered 2020-01-13 – 2020-01-18 (×6): 1 [drp] via OPHTHALMIC
  Filled 2020-01-13 (×5): qty 2.5

## 2020-01-13 MED ORDER — TRAZODONE HCL 50 MG PO TABS
50.0000 mg | ORAL_TABLET | Freq: Every evening | ORAL | Status: DC | PRN
  Filled 2020-01-13: qty 1

## 2020-01-13 MED ORDER — ATORVASTATIN CALCIUM 40 MG PO TABS
40.0000 mg | ORAL_TABLET | Freq: Every day | ORAL | Status: DC
  Administered 2020-01-14 – 2020-01-18 (×5): 40 mg via ORAL
  Filled 2020-01-13 (×6): qty 1

## 2020-01-13 MED ORDER — ONDANSETRON HCL 4 MG/2ML IJ SOLN: 4.0000 mg | Freq: Four times a day (QID) | INTRAMUSCULAR | Status: DC | PRN

## 2020-01-13 MED ORDER — BRIMONIDINE TARTRATE 0.2 % OP SOLN
1.0000 [drp] | Freq: Three times a day (TID) | OPHTHALMIC | Status: DC
  Administered 2020-01-13 – 2020-01-18 (×16): 1 [drp] via OPHTHALMIC
  Filled 2020-01-13 (×4): qty 5

## 2020-01-13 MED ORDER — DORZOLAMIDE HCL-TIMOLOL MAL 2-0.5 % OP SOLN
1.0000 [drp] | Freq: Two times a day (BID) | OPHTHALMIC | Status: DC
  Administered 2020-01-13 – 2020-01-18 (×11): 1 [drp] via OPHTHALMIC
  Filled 2020-01-13 (×3): qty 10

## 2020-01-13 MED ORDER — HEPARIN SODIUM (PORCINE) 5000 UNIT/ML IJ SOLN
5000.0000 [IU] | Freq: Three times a day (TID) | INTRAMUSCULAR | Status: DC
  Administered 2020-01-13 (×2): 5000 [IU] via SUBCUTANEOUS
  Filled 2020-01-13: qty 1

## 2020-01-13 MED ORDER — POLYETHYLENE GLYCOL 3350 17 G PO PACK: 17.0000 g | PACK | Freq: Every day | ORAL | Status: DC | PRN

## 2020-01-13 MED ORDER — LACTATED RINGERS IV BOLUS
1000.0000 mL | Freq: Once | INTRAVENOUS | Status: AC
  Administered 2020-01-13: 07:00:00 1000 mL via INTRAVENOUS

## 2020-01-13 MED ORDER — ASPIRIN 81 MG PO CHEW
324.0000 mg | CHEWABLE_TABLET | Freq: Once | ORAL | Status: AC
  Administered 2020-01-13: 07:00:00 324 mg via ORAL
  Filled 2020-01-13: qty 4

## 2020-01-13 MED ORDER — CARBIDOPA-LEVODOPA 25-100 MG PO TABS
2.0000 | ORAL_TABLET | Freq: Three times a day (TID) | ORAL | Status: DC
  Administered 2020-01-14: 12:00:00 2 via ORAL
  Filled 2020-01-13 (×4): qty 2

## 2020-01-13 MED ORDER — CARBIDOPA-LEVODOPA 25-100 MG PO TABS
1.0000 | ORAL_TABLET | Freq: Three times a day (TID) | ORAL | Status: DC
  Filled 2020-01-13 (×3): qty 1

## 2020-01-13 MED ORDER — BISACODYL 10 MG RE SUPP: 10.0000 mg | Freq: Every day | RECTAL | Status: DC | PRN

## 2020-01-13 MED ORDER — ASCORBIC ACID 500 MG PO TABS
500.0000 mg | ORAL_TABLET | Freq: Every day | ORAL | Status: DC
  Administered 2020-01-13 – 2020-01-18 (×6): 500 mg via ORAL
  Filled 2020-01-13 (×6): qty 1

## 2020-01-13 MED ORDER — ACETAMINOPHEN 650 MG RE SUPP: 650.0000 mg | Freq: Four times a day (QID) | RECTAL | Status: DC | PRN

## 2020-01-13 MED ORDER — NITROGLYCERIN 0.4 MG SL SUBL: 0.4000 mg | SUBLINGUAL_TABLET | SUBLINGUAL | Status: DC | PRN

## 2020-01-13 MED ORDER — SODIUM CHLORIDE 0.9 % IV SOLN
250.0000 mL | INTRAVENOUS | Status: DC | PRN
  Administered 2020-01-15: 12:00:00 250 mL via INTRAVENOUS

## 2020-01-13 MED ORDER — AMLODIPINE BESYLATE 5 MG PO TABS
5.0000 mg | ORAL_TABLET | Freq: Every day | ORAL | Status: DC
  Administered 2020-01-13 – 2020-01-18 (×6): 5 mg via ORAL
  Filled 2020-01-13 (×6): qty 1

## 2020-01-13 MED ORDER — SODIUM CHLORIDE 0.9% FLUSH
3.0000 mL | Freq: Two times a day (BID) | INTRAVENOUS | Status: DC
  Administered 2020-01-13 – 2020-01-18 (×5): 3 mL via INTRAVENOUS

## 2020-01-13 MED ORDER — PANTOPRAZOLE SODIUM 20 MG PO TBEC
20.0000 mg | DELAYED_RELEASE_TABLET | Freq: Every day | ORAL | Status: DC
Start: 2020-01-13 — End: 2020-01-13
  Filled 2020-01-13 (×2): qty 1

## 2020-01-13 MED ORDER — PANTOPRAZOLE SODIUM 40 MG PO TBEC
40.0000 mg | DELAYED_RELEASE_TABLET | Freq: Every day | ORAL | Status: DC
  Administered 2020-01-14 – 2020-01-18 (×5): 40 mg via ORAL
  Filled 2020-01-13 (×6): qty 1

## 2020-01-13 MED ORDER — ASPIRIN 81 MG PO CHEW
81.0000 mg | CHEWABLE_TABLET | Freq: Every day | ORAL | Status: DC
  Administered 2020-01-14: 12:00:00 81 mg via ORAL
  Filled 2020-01-13 (×2): qty 1

## 2020-01-13 NOTE — H&P (Signed)
Patient Demographics:    [name], is a 86 y.o. male  MRN: [id number]   DOB - [birth year]  Admit Date - 01/13/2020  Outpatient Primary MD for the patient is Celene Squibb, MD   Assessment & Plan:    Principal Problem:   AKI (acute kidney injury) [facility]) Active Problems:   Brain Mass---right lateral frontotemporal extra-axial mass 7.1 cm with typical features of meningioma 02/28/18 (to follow-up w/ neurosurgeon Dr. Vertell Limber)   Essential hypertension   OSA (obstructive sleep apnea)   Coronary artery disease- s/p DES to LAD (02/19/18)   Swallowing impairment   Aortic stenosis-hx of aortic stenosis status post TAVR in March 2020   HLD (hyperlipidemia)   Parkinson's   Elevated troponin   Elevated d-dimer   Glaucoma  A/p 1)AKI----due to dehydration in the setting of diarrhea, GI losses compounded by losartan use -Patient's creatinine was 1.0 on 04/07/2019, creatinine was 1.1 on 06/28/2018 -Creatinine on presentation to the ED today was 6.19, repeat creatinine after IV fluid boluses is down to 3.3 -Renal ultrasound without obstructive uropathy or significant medical renal disease -- renally adjust medications, avoid nephrotoxic agents / dehydration  / hypotension -Hold losartan -Continue IV fluids, -Be judicious with IV fluids due to history of diastolic dysfunction CHF  2)H/o Brain Mass- right lateral frontotemporal extra-axial mass 7.1 cm with typical features of meningioma 02/28/18 (to follow-up w/ neurosurgeon Dr. Vertell Limber) -Patient presenting with swallowing difficulties, will get CT head  3) -Parkinson's disease--stable continue Sinemet and Mirapex  4)HTN---stable, hold losartan due to AKI, okay to resume metoprolol and amlodipine  5)H/o CAD- s/p DES to LAD (02/19/18), chest pain-free, okay to continue Lipitor and  aspirin -Troponin 115 >>123 in the setting of AKI -Repeat echocardiogram to rule out wall motion normalities and to evaluate EF pending -Started on IV heparin due to elevated D-dimer rather than frank ACS  6)HFpEF/hx of aortic stenosis status post TAVR in March 2020--- echo from March 20, 2019 with preserved EF of 70 to 75 %, grade 1 diastolic dysfunction and aortic valve was found to be functioning normally after prior TAVR -Be judicious with IV fluids due to history of diastolic dysfunction CHF -Clinically patient appears dry at this time  7)Diarrhea--this is probably contributing to AKI, we will check GI pathogen and stool for C. Difficile -WBC 17.8, CRP 16.9, PCT is 0.19, lactic acid 1.8 -CT abdomen and pelvis without IV contrast but with oral contrast pending - 8)Dyspnea---  D-dimer is 14.0, get VQ scan, BNP is 240, mild elevated troponins as above #5 --Chest x-ray without acute findings -IV heparin pending VQ scan -Elevated inflammatory markers noted with an LDH of 224, fibrinogen is 552, CRP is 16.9 -Ferritin is only 173 -Patient is vaccinated against COVID-19 infection received booster -COVID-19 test is negative  Disposition/Need for in-Hospital Stay- patient unable to be discharged at this time due to --- AKI requiring IV fluids, diarrhea with elevated CRP and elevated WBC  requiring CT abdomen pelvis further work-up, elevated troponins requiring further monitoring and echo  Dispo: The patient is from: Home              Anticipated d/c is to: Home              Anticipated d/c date is: 2 days              Patient currently is not medically stable to d/c. Barriers: Not Clinically Stable-   With History of - Reviewed by me  Past Medical History:  Diagnosis Date  . Arthritis   . Brain mass    right lateral frontotemporal extra-axial mass 7.1 cm with typical features of meningioma 02/28/18 (to follow-up w/ neurosurgeon Dr. Venetia Maxon)  . [facility])    h/o  skin  cancer  .  Coronary artery disease    s/p DES to LAD (02/19/18)  . Ex-smoker   . GERD (gastroesophageal reflux disease)   . Glaucoma 01/13/2020  . Hypercholesteremia   . Hypertension   . Lumbar stenosis   . OSA (obstructive sleep apnea)   . Pancreatic duct dilated   . Parkinson's disease (HCC)   . S/P TAVR (transcatheter aortic valve replacement)   . Severe aortic stenosis       Past Surgical History:  Procedure Laterality Date  . APPENDECTOMY    . BACK SURGERY  11/2013  . COLONOSCOPY  11/17/2008   Rourk: Sessile polyp in the cecum status post saline-assisted piecemeal polypectomy, resolution clipping and epinephrine injection therapy performed. Shallow sigmoid diverticula.. Tubular adenoma  . COLONOSCOPY  11/19/2008   Fields: Large amount of liquid blood with clots seen throughout the colon. Previous [place] resolution clip remained in place. Purple spot seen in the lateral aspect polypectomy site, 3 resolution clips placed here. 6 mm sessile ascending colon polyp seen and not manipulated.  . COLONOSCOPY  11/25/2008   Rourk: Blood-tinged colonic effluent, suspect recurrent post polypectomy bleeding status post placement of 3 clips on the polypectomy site on top of the 3 clips that already existed, one had fallen off since last procedure. One small polyp distal to the cecum not manipulated  . COLONOSCOPY  12/06/2009   Rourk: Internal hemorrhoids, scattered left-sided diverticula, cecal polyps, one snared and subsequently clipped, 2 adjacent diminutive polyps ablated. tubular adenoma  . COLONOSCOPY N/A 01/21/2015   Dr. Jena Gauss: 1.5 cm carpet-type polyp just distal to the ileocecal valve removed piecemeal fashion and ablated. Small polyp in the rectum. Pathology-tubular adenomas  . COLONOSCOPY N/A 09/01/2015   Procedure: COLONOSCOPY;  Surgeon: Corbin Ade, MD;  Location[facility];  Service: Endoscopy;  Laterality: N/A;  845  . CORONARY STENT INTERVENTION N/A 02/19/2018   Procedure: CORONARY STENT  INTERVENTION;  Surgeon: Tonny Bollman, MD;  Location: [facility] INVASIVE CV LAB;  Service: Cardiovascular;  Laterality: N/A;  . EYE SURGERY     bilateral  cataracts  . INTRAVASCULAR PRESSURE WIRE/FFR STUDY N/A 02/19/2018   Procedure: INTRAVASCULAR PRESSURE WIRE/FFR STUDY;  Surgeon: Tonny Bollman, MD;  Location: [facility] INVASIVE CV LAB;  Service: Cardiovascular;  Laterality: N/A;  . MENISCUS REPAIR     right knee  2000  . POLYPECTOMY  09/01/2015   Procedure: POLYPECTOMY;  Surgeon: Corbin Ade, MD;  Location: [facility];  Service: Endoscopy;;  colon  . RIGHT/LEFT HEART CATH AND CORONARY ANGIOGRAPHY N/A 02/19/2018   Procedure: RIGHT/LEFT HEART CATH AND CORONARY ANGIOGRAPHY;  Surgeon: Tonny Bollman, MD;  Location: [facility] INVASIVE CV LAB;  Service: Cardiovascular;  Laterality: N/A;  . TEE WITHOUT CARDIOVERSION N/A 03/19/2018   Procedure: TRANSESOPHAGEAL ECHOCARDIOGRAM (TEE);  Surgeon: [name], MD;  Location: [place] CV LAB;  Service: Open Heart Surgery;  Laterality: N/A;  . TONSILLECTOMY Bilateral   . TRANSCATHETER AORTIC VALVE REPLACEMENT, TRANSFEMORAL N/A 03/19/2018   Procedure: TRANSCATHETER AORTIC VALVE REPLACEMENT, TRANSFEMORAL;  Surgeon: [name], MD;  Location: [place] CV LAB;  Service: Open Heart Surgery;  Laterality: N/A;    Chief Complaint  Patient presents with  . Shortness of Breath      HPI:    [name]  is a 86 y.o. male reformed smoker with past medical history relevant for Parkinson's disease, HTN, history of brain mass, CAD with prior stent to the LAD back in February [id number], diastolic dysfunction CHF, Who presents to the ED with concerns for shortness of breath and diarrhea of about 3 days duration--no emesis, but patient does have nausea and poor appetite -Additional history obtained from patient's son at bedside, no chest pains no palpitations no dizziness patient did have some shortness of breath -Patient is fully vaccinated against COVID-19 and he has  received his booster - -Patient's creatinine was 1.0 on 04/07/2019, creatinine was 1.1 on 06/28/2018 -Creatinine on presentation to the ED today was 6.19,  -Patient also noted to be having some difficulties with swallowing, CT head is pending -In the ED labs reveal -Troponin 115 >>123 in the setting of AKI,BNP is 240, -Requested repeat echocardiogram to rule out wall motion normalities and to evaluate EF pending -Started on IV heparin due to elevated D-dimer rather than frank ACS --WBC 17.8, CRP 16.9, PCT is 0.19, lactic acid 1.8 in the setting of diarrhea -CT abdomen and pelvis without IV contrast but with oral contrast pending -Stool for C. difficile and GI pathogen requested -In ED patient is also noted to have Elevated D-dimer is 14.0 in the setting of dyspnea-- VQ scan requested,  --Chest x-ray without acute findings -Started IV heparin pending VQ scan -Elevated inflammatory markers noted with an LDH of 224, fibrinogen is 552, CRP is 16.9 -Ferritin is only 173 -COVID-19 test is negative    Review of systems:    In addition to the HPI above,   A full Review of  Systems was done, all other systems reviewed are negative except as noted above in HPI , .    Social History:  Reviewed by me    Social History   Tobacco Use  . Smoking status: Former Smoker    Packs/day: 1.00    Types: Cigarettes, Pipe    Start date: 01/22/1951    Quit date: 01/21/1989    Years since quitting: 30.9  . Smokeless tobacco: Never Used  Substance Use Topics  . Alcohol use: No    Alcohol/week: 0.0 standard drinks      Family History :  Reviewed by me    Family History  Problem Relation Age of Onset  . Dementia Mother   . Cancer Brother   . Stroke Brother      Home Medications:   Prior to Admission medications   Medication Sig Start Date End Date Taking? Authorizing Provider  amLODipine (NORVASC) 5 MG tablet Take 1 tablet (5 mg total) by mouth daily. 01/06/20  Yes Strader, Fransisco Hertz, PA-C   aspirin 81 MG chewable tablet Chew 81 mg by mouth daily.   Yes [provider]  atorvastatin (LIPITOR) 40 MG tablet TAKE 1 TABLET BY MOUTH&nbsp;&nbsp;DAILY AT 6PM Patient taking differently: Take 40 mg  by mouth daily. at [facility] 01/06/20  Yes Strader, [place] M, PA-C  brimonidine (ALPHAGAN) 0.2 % ophthalmic solution Place 1 drop into both eyes 3 (three) times daily.  12/04/17  Yes [provider]  calcium gluconate 500 MG tablet Take 500 mg by mouth daily.   Yes [provider]  carbidopa-levodopa (SINEMET IR) 25-100 MG tablet TAKE 2 TABLETS BY MOUTH AT  6AM, 1 TABLET AT 9AM, 2  TABLETS AT 12PM, 1 TABLET  AT 3PM, 2 TABLETS AT 6PM,  AND 1 TABLET  AT 9PM Patient taking differently: Take 1-2 tablets by mouth See admin instructions. TAKE 2 TABLETS BY MOUTH AT  6AM, 1 TABLET AT 9AM, 2  TABLETS AT 12PM, 1 TABLET  AT 3PM, 2 TABLETS AT 6PM,  AND 1 TABLET  AT 9PM 06/04/19  Yes Star Age, MD  dorzolamide-timolol (COSOPT) 22.3-6.8 MG/ML ophthalmic solution Place 1 drop into both eyes 2 (two) times daily.  12/04/17  Yes [provider]  glucosamine-chondroitin 500-400 MG tablet Take 1 tablet by mouth daily.    Yes [provider]  latanoprost (XALATAN) 0.005 % ophthalmic solution Place 1 drop into both eyes at bedtime.  01/05/18  Yes [provider]  losartan (COZAAR) 100 MG tablet TAKE 1 TABLET BY MOUTH  DAILY Patient taking differently: Take 100 mg by mouth daily. 05/12/19  Yes Eileen Stanford, PA-C  metoprolol succinate (TOPROL-XL) 25 MG 24 hr tablet Take 1 tablet (25 mg total) by mouth daily. 01/06/20  Yes Strader, [place], PA-C  Multiple Vitamin (MULTIVITAMIN) tablet Take 1 tablet by mouth daily.   Yes [provider]  pantoprazole (PROTONIX) 20 MG tablet TAKE 1 TABLET BY MOUTH  DAILY Patient taking differently: Take 20 mg by mouth daily. 02/27/19  Yes Herminio Commons, MD  vitamin C (ASCORBIC ACID) 500 MG tablet Take 500 mg by mouth  daily.   Yes [provider]  amoxicillin (AMOXIL) 500 MG tablet Take 4 tablets (2,000 mg) one hour prior to all dental visits. Patient not taking: Reported on 01/13/2020 03/28/18   Eileen Stanford, PA-C  nitroGLYCERIN (NITROSTAT) 0.4 MG SL tablet Place 1 tablet (0.4 mg total) under the tongue every 5 (five) minutes as needed. Patient taking differently: Place 0.4 mg under the tongue every 5 (five) minutes as needed for chest pain. 02/20/18   Bhagat, Crista Luria, PA  pramipexole (MIRAPEX) 0.5 MG tablet Take 1 tablet (0.5 mg total) by mouth 3 (three) times daily. Patient not taking: No sig reported 04/17/19   Star Age, MD     Allergies:    No Known Allergies   Physical Exam:   Vitals  Blood pressure (!) 151/94, pulse (!) 101, temperature 98.2 F (36.8 C), temperature source Oral, resp. rate 20, height 5\' 10"  (1.778 m), weight 85.3 kg, SpO2 99 %.  Physical Examination: General appearance - alert,   and in no distress  Mental status - alert, oriented to person, place, and time,  Eyes - sclera anicteric Neck - supple, no JVD elevation , Chest - clear  to auscultation bilaterally, symmetrical air movement,  Heart - S1 and S2 normal, regular  Abdomen - soft, nontender, nondistended, no CVA area tenderness Neurological - screening mental status exam normal, neck supple without rigidity, generalized weakness with swallowing difficulties otherwise no other significant focal neuro deficits Extremities - no pedal edema noted, intact peripheral pulses  Skin - warm, dry     Data Review:    CBC Recent Labs  Lab 01/13/20  0510  WBC 17.8*  HGB 15.4  HCT 44.4  PLT 134*  MCV 101.4*  MCH 35.2*  MCHC 34.7  RDW 14.0  LYMPHSABS 1.2  MONOABS 1.2*  EOSABS 0.2  BASOSABS 0.0   ------------------------------------------------------------------------------------------------------------------  Chemistries  Recent Labs  Lab 01/13/20 0510 01/13/20 1423  NA 135 137  K 4.3 3.7   CL 101 102  CO2 20* 24  GLUCOSE 141* 123*  BUN 65* 52*  CREATININE 6.19* 3.30*  CALCIUM 8.9 8.7*  AST 17  --   ALT 6  --   ALKPHOS 71  --   BILITOT 1.1  --    ------------------------------------------------------------------------------------------------------------------ estimated creatinine clearance is 16.6 mL/min (A) (by C-G formula based on SCr of 3.3 mg/dL (H)). ------------------------------------------------------------------------------------------------------------------ No results for input(s): TSH, T4TOTAL, T3FREE, THYROIDAB in the last 72 hours.  Invalid input(s): FREET3   Coagulation profile No results for input(s): INR, PROTIME in the last 168 hours. ------------------------------------------------------------------------------------------------------------------- Recent Labs    01/13/20 0510  DDIMER 14.02*   -------------------------------------------------------------------------------------------------------------------  Cardiac Enzymes No results for input(s): CKMB, TROPONINI, MYOGLOBIN in the last 168 hours.  Invalid input(s): CK ------------------------------------------------------------------------------------------------------------------    Component Value Date/Time   BNP 240.0 (H) 01/13/2020 0511     ---------------------------------------------------------------------------------------------------------------  Urinalysis    Component Value Date/Time   COLORURINE YELLOW 01/13/2020 [place] 01/13/2020 0717   LABSPEC 1.019 01/13/2020 0717   PHURINE 5.0 01/13/2020 0717   GLUCOSEU NEGATIVE 01/13/2020 0717   HGBUR LARGE (A) 01/13/2020 0717   BILIRUBINUR NEGATIVE 01/13/2020 0717   KETONESUR 5 (A) 01/13/2020 0717   PROTEINUR 30 (A) 01/13/2020 0717   NITRITE NEGATIVE 01/13/2020 0717   LEUKOCYTESUR NEGATIVE 01/13/2020 0717     ----------------------------------------------------------------------------------------------------------------   Imaging Results:    US RENAL  Result Date: 01/13/2020 CLINICAL DATA:  Acute kidney injury EXAM: RENAL / URINARY TRACT ULTRASOUND COMPLETE COMPARISON:  Ultrasound abdomen 06/05/2014.  MRI abdomen 12/05/2018 FINDINGS: Right Kidney: Renal measurements: 11.2 x 5.1 x 5.7 cm = volume: 169 mL. Echogenicity within normal limits. No mass or hydronephrosis visualized. Left Kidney: Renal measurements: 12.0 x 5.5 x 5.2 cm = volume: 206 mL. 2.5 cm left upper pole cyst stable from prior studies. Echogenicity within normal limits. No mass or hydronephrosis visualized. Bladder: Foley catheter with empty urinary bladder. Other: Bedside examination. IMPRESSION: Normal renal size and echogenicity.  No renal obstruction. Electronically Signed   By: Franchot Gallo M.D.   On: 01/13/2020 10:01   DG Chest Port 1 View  Result Date: 01/13/2020 CLINICAL DATA:  86 year old male with shortness of breath. Possible exposure to COVID-19. EXAM: PORTABLE CHEST 1 VIEW COMPARISON:  Chest radiographs 01/06/2020 and earlier. FINDINGS: Portable AP semi upright view at 0545 hours. Sequelae of TAVR. Stable cardiac size and mediastinal contours. Heart size at the upper limits of normal. Visualized tracheal air column is within normal limits. Mildly lower lung volumes. Allowing for portable technique the lungs are clear. No pneumothorax or pleural effusion. Visible bowel gas increased in the upper abdomen but remains within normal limits. No acute osseous abnormality identified. IMPRESSION: No acute cardiopulmonary abnormality. Electronically Signed   By: Genevie Ann M.D.   On: 01/13/2020 06:00    Radiological Exams on Admission: US RENAL  Result Date: 01/13/2020 CLINICAL DATA:  Acute kidney injury EXAM: RENAL / URINARY TRACT ULTRASOUND COMPLETE COMPARISON:  Ultrasound abdomen 06/05/2014.  MRI abdomen 12/05/2018 FINDINGS:  Right Kidney: Renal measurements: 11.2 x 5.1 x 5.7 cm = volume: 169 mL. Echogenicity within normal limits.  No mass or hydronephrosis visualized. Left Kidney: Renal measurements: 12.0 x 5.5 x 5.2 cm = volume: 206 mL. 2.5 cm left upper pole cyst stable from prior studies. Echogenicity within normal limits. No mass or hydronephrosis visualized. Bladder: Foley catheter with empty urinary bladder. Other: Bedside examination. IMPRESSION: Normal renal size and echogenicity.  No renal obstruction. Electronically Signed   By: Franchot Gallo M.D.   On: 01/13/2020 10:01   DG Chest Port 1 View  Result Date: 01/13/2020 CLINICAL DATA:  87 year old male with shortness of breath. Possible exposure to COVID-19. EXAM: PORTABLE CHEST 1 VIEW COMPARISON:  Chest radiographs 01/06/2020 and earlier. FINDINGS: Portable AP semi upright view at 0545 hours. Sequelae of TAVR. Stable cardiac size and mediastinal contours. Heart size at the upper limits of normal. Visualized tracheal air column is within normal limits. Mildly lower lung volumes. Allowing for portable technique the lungs are clear. No pneumothorax or pleural effusion. Visible bowel gas increased in the upper abdomen but remains within normal limits. No acute osseous abnormality identified. IMPRESSION: No acute cardiopulmonary abnormality. Electronically Signed   By: Genevie Ann M.D.   On: 01/13/2020 06:00    DVT Prophylaxis -IV heparin AM Labs Ordered, also please review Full Orders  Family Communication: Admission, patients condition and plan of care including tests being ordered have been discussed with the patient and son who indicate understanding and agree with the plan   Code Status - Full Code  Likely DC to  home  Condition   stable  Roxan Hockey M.D on 01/13/2020 at 5:21 PM Go to [email or web address] -  for contact info  [facility] - Office  [phone]

## 2020-01-13 NOTE — ED Provider Notes (Signed)
[facility] EMERGENCY DEPARTMENT Provider Note   CSN: [id number] Arrival date & time: 01/13/20  0340     History Chief Complaint  Patient presents with  . Shortness of Breath    [name] is a 86 y.o. male.  Patient was initially coming in secondary to decreased urine output that also apparently was hypoxic in the triage.  He stated he had been short of breath.  And apparently there is some people that have Covid in the house.  The history is provided by the patient.  Shortness of Breath Severity:  Moderate Onset quality:  Gradual Timing:  Constant Progression:  Worsening Chronicity:  New Context: not activity   Relieved by:  Nothing Worsened by:  Nothing Ineffective treatments:  None tried Associated symptoms: no abdominal pain, no cough, no fever and no neck pain        Past Medical History:  Diagnosis Date  . Arthritis   . Brain mass    right lateral frontotemporal extra-axial mass 7.1 cm with typical features of meningioma 02/28/18 (to follow-up w/ neurosurgeon Dr. Vertell Limber)  . Cancer [facility])    h/o  skin  cancer  . Coronary artery disease    s/p DES to LAD (02/19/18)  . Ex-smoker   . GERD (gastroesophageal reflux disease)   . Glaucoma 01/13/2020  . Hypercholesteremia   . Hypertension   . Lumbar stenosis   . OSA (obstructive sleep apnea)   . Pancreatic duct dilated   . Parkinson's disease ([place])   . S/P TAVR (transcatheter aortic valve replacement)   . Severe aortic stenosis     Patient Active Problem List   Diagnosis Date Noted  . AKI (acute kidney injury) ([place]) 01/13/2020  . Elevated troponin 01/13/2020  . Elevated d-dimer 01/13/2020  . Glaucoma 01/13/2020  . Acute on chronic diastolic heart failure ([place]) 03/20/2018  . S/P TAVR (transcatheter aortic valve replacement) 03/19/2018  . Coronary artery disease   . Pancreatic duct dilated   . Severe aortic stenosis 02/19/2018  . OSA (obstructive sleep apnea)   . History of colonic polyps   . Hx of  adenomatous colonic polyps 01/04/2015  . Lumbar canal stenosis 12/04/2013  . Primary central sleep apnea 04/16/2012  . Orthostatic hypotension 04/16/2012  . Hereditary and idiopathic peripheral neuropathy 04/16/2012  . Parkinson's 04/16/2012  . COLONIC POLYPS, ADENOMATOUS 11/19/2008  . GERD 11/19/2008  . GI BLEEDING 11/19/2008  . HLD (hyperlipidemia) 07/03/2008  . Essential hypertension 07/03/2008    Past Surgical History:  Procedure Laterality Date  . APPENDECTOMY    . BACK SURGERY  11/2013  . COLONOSCOPY  11/17/2008   Rourk: Sessile polyp in the cecum status post saline-assisted piecemeal polypectomy, resolution clipping and epinephrine injection therapy performed. Shallow sigmoid diverticula.. Tubular adenoma  . COLONOSCOPY  11/19/2008   Fields: Large amount of liquid blood with clots seen throughout the colon. Previous [place] resolution clip remained in place. Purple spot seen in the lateral aspect polypectomy site, 3 resolution clips placed here. 6 mm sessile ascending colon polyp seen and not manipulated.  . COLONOSCOPY  11/25/2008   Rourk: Blood-tinged colonic effluent, suspect recurrent post polypectomy bleeding status post placement of 3 clips on the polypectomy site on top of the 3 clips that already existed, one had fallen off since last procedure. One small polyp distal to the cecum not manipulated  . COLONOSCOPY  12/06/2009   Rourk: Internal hemorrhoids, scattered left-sided diverticula, cecal polyps, one snared and subsequently clipped, 2 adjacent diminutive polyps ablated.  tubular adenoma  . COLONOSCOPY N/A 01/21/2015   Dr. Jena Gauss: 1.5 cm carpet-type polyp just distal to the ileocecal valve removed piecemeal fashion and ablated. Small polyp in the rectum. Pathology-tubular adenomas  . COLONOSCOPY N/A 09/01/2015   Procedure: COLONOSCOPY;  Surgeon: Corbin Ade, MD;  Location[facility];  Service: Endoscopy;  Laterality: N/A;  845  . CORONARY STENT INTERVENTION N/A  02/19/2018   Procedure: CORONARY STENT INTERVENTION;  Surgeon: Tonny Bollman, MD;  Location: [facility] INVASIVE CV LAB;  Service: Cardiovascular;  Laterality: N/A;  . EYE SURGERY     bilateral  cataracts  . INTRAVASCULAR PRESSURE WIRE/FFR STUDY N/A 02/19/2018   Procedure: INTRAVASCULAR PRESSURE WIRE/FFR STUDY;  Surgeon: Tonny Bollman, MD;  Location: [facility] INVASIVE CV LAB;  Service: Cardiovascular;  Laterality: N/A;  . MENISCUS REPAIR     right knee  2000  . POLYPECTOMY  09/01/2015   Procedure: POLYPECTOMY;  Surgeon: Corbin Ade, MD;  Location: [facility];  Service: Endoscopy;;  colon  . RIGHT/LEFT HEART CATH AND CORONARY ANGIOGRAPHY N/A 02/19/2018   Procedure: RIGHT/LEFT HEART CATH AND CORONARY ANGIOGRAPHY;  Surgeon: Tonny Bollman, MD;  Location: [facility] INVASIVE CV LAB;  Service: Cardiovascular;  Laterality: N/A;  . TEE WITHOUT CARDIOVERSION N/A 03/19/2018   Procedure: TRANSESOPHAGEAL ECHOCARDIOGRAM (TEE);  Surgeon: Tonny Bollman, MD;  Location: [facility] INVASIVE CV LAB;  Service: Open Heart Surgery;  Laterality: N/A;  . TONSILLECTOMY Bilateral   . TRANSCATHETER AORTIC VALVE REPLACEMENT, TRANSFEMORAL N/A 03/19/2018   Procedure: TRANSCATHETER AORTIC VALVE REPLACEMENT, TRANSFEMORAL;  Surgeon: Tonny Bollman, MD;  Location: [facility] INVASIVE CV LAB;  Service: Open Heart Surgery;  Laterality: N/A;       Family History  Problem Relation Age of Onset  . Dementia Mother   . Cancer Brother   . Stroke Brother     Social History   Tobacco Use  . Smoking status: Former Smoker    Packs/day: 1.00    Types: Cigarettes, Pipe    Start date: 01/22/1951    Quit date: 01/21/1989    Years since quitting: 30.9  . Smokeless tobacco: Never Used  Vaping Use  . Vaping Use: Never used  Substance Use Topics  . Alcohol use: No    Alcohol/week: 0.0 standard drinks  . Drug use: No    Home Medications Prior to Admission medications   Medication Sig Start Date End Date Taking? Authorizing Provider  amLODipine (NORVASC) 5 MG  tablet Take 1 tablet (5 mg total) by mouth daily. 01/06/20   Strader, Lennart Pall, PA-C  amoxicillin (AMOXIL) 500 MG tablet Take 4 tablets (2,000 mg) one hour prior to all dental visits. 03/28/18   Janetta Hora, PA-C  aspirin 81 MG chewable tablet Chew 81 mg by mouth daily.    [provider]  atorvastatin (LIPITOR) 40 MG tablet TAKE 1 TABLET BY MOUTH  DAILY AT 6PM 01/06/20   Strader, [place] M, PA-C  brimonidine (ALPHAGAN) 0.2 % ophthalmic solution Place 1 drop into both eyes 3 (three) times daily.  12/04/17   [provider]  calcium gluconate 500 MG tablet Take 500 mg by mouth daily.    [provider]  carbidopa-levodopa (SINEMET IR) 25-100 MG tablet TAKE 2 TABLETS BY MOUTH AT  6AM, 1 TABLET AT 9AM, 2  TABLETS AT 12PM, 1 TABLET  AT 3PM, 2 TABLETS AT 6PM,  AND 1 TABLET  AT 9PM 06/04/19   Huston Foley, MD  dorzolamide-timolol (COSOPT) 22.3-6.8 MG/ML ophthalmic solution Place 1 drop into both eyes  2 (two) times daily.  12/04/17   [provider]  glucosamine-chondroitin 500-400 MG tablet Take 1 tablet by mouth daily.     [provider]  latanoprost (XALATAN) 0.005 % ophthalmic solution Place 1 drop into both eyes at bedtime.  01/05/18   [provider]  losartan (COZAAR) 100 MG tablet TAKE 1 TABLET BY MOUTH  DAILY 05/12/19   Eileen Stanford, PA-C  metoprolol succinate (TOPROL-XL) 25 MG 24 hr tablet Take 1 tablet (25 mg total) by mouth daily. 01/06/20   Strader, Fransisco Hertz, PA-C  Multiple Vitamin (MULTIVITAMIN) tablet Take 1 tablet by mouth daily.    [provider]  nitroGLYCERIN (NITROSTAT) 0.4 MG SL tablet Place 1 tablet (0.4 mg total) under the tongue every 5 (five) minutes as needed. 02/20/18   Bhagat, Crista Luria, PA  pantoprazole (PROTONIX) 20 MG tablet TAKE 1 TABLET BY MOUTH  DAILY 02/27/19   Herminio Commons, MD  pramipexole (MIRAPEX) 0.5 MG tablet Take 1 tablet (0.5 mg total) by mouth 3 (three) times daily. 04/17/19    Star Age, MD  vitamin C (ASCORBIC ACID) 500 MG tablet Take 500 mg by mouth daily.    [provider]    Allergies    Patient has no known allergies.  Review of Systems   Review of Systems  Constitutional: Negative for fever.  Respiratory: Positive for shortness of breath. Negative for cough.   Gastrointestinal: Negative for abdominal pain.  Musculoskeletal: Negative for neck pain.  All other systems reviewed and are negative.   Physical Exam Updated Vital Signs BP (!) 144/67   Pulse 94   Temp 98.2 F (36.8 C) (Oral)   Resp 19   Ht 5\' 10"  (1.778 m)   Wt 85.3 kg   SpO2 98%   BMI 26.98 kg/m   Physical Exam Vitals and nursing note reviewed.  Constitutional:      Appearance: He is well-developed and well-nourished.  HENT:     Head: Normocephalic and atraumatic.  Cardiovascular:     Rate and Rhythm: Normal rate.  Pulmonary:     Effort: Pulmonary effort is normal. No respiratory distress.     Breath sounds: Rales (right lower lobe) present. No wheezing.  Chest:     Chest wall: No mass or tenderness.  Abdominal:     General: There is no distension.  Musculoskeletal:        General: Normal range of motion.     Cervical back: Normal range of motion.     Right lower leg: No edema.     Left lower leg: No edema.  Skin:    General: Skin is warm and dry.  Neurological:     General: No focal deficit present.     Mental Status: He is alert.     ED Results / Procedures / Treatments   Labs (all labs ordered are listed, but only abnormal results are displayed) Labs Reviewed  BRAIN NATRIURETIC PEPTIDE - Abnormal; Notable for the following components:      Result Value   B Natriuretic Peptide 240.0 (*)    All other components within normal limits  CBC WITH DIFFERENTIAL/PLATELET - Abnormal; Notable for the following components:   WBC 17.8 (*)    MCV 101.4 (*)    MCH 35.2 (*)    Platelets 134 (*)    Neutro Abs 15.1 (*)    Monocytes Absolute 1.2 (*)    Abs  Immature Granulocytes 0.08 (*)    All other components within  normal limits  COMPREHENSIVE METABOLIC PANEL - Abnormal; Notable for the following components:   CO2 20 (*)    Glucose, Bld 141 (*)    BUN 65 (*)    Creatinine, Ser 6.19 (*)    GFR, Estimated 8 (*)    All other components within normal limits  D-DIMER, QUANTITATIVE (NOT AT [facility]) - Abnormal; Notable for the following components:   D-Dimer, Quant 14.02 (*)    All other components within normal limits  LACTATE DEHYDROGENASE - Abnormal; Notable for the following components:   LDH 224 (*)    All other components within normal limits  FIBRINOGEN - Abnormal; Notable for the following components:   Fibrinogen 552 (*)    All other components within normal limits  TROPONIN I (HIGH SENSITIVITY) - Abnormal; Notable for the following components:   Troponin I (High Sensitivity) 115 (*)    All other components within normal limits  RESP PANEL BY RT-PCR (FLU A&B, COVID) ARPGX2  CULTURE, BLOOD (ROUTINE X 2)  CULTURE, BLOOD (ROUTINE X 2)  LACTIC ACID, PLASMA  LACTIC ACID, PLASMA  PROCALCITONIN  TRIGLYCERIDES  FERRITIN  C-REACTIVE PROTEIN  URINALYSIS, ROUTINE W REFLEX MICROSCOPIC  TROPONIN I (HIGH SENSITIVITY)    EKG None  Radiology DG Chest Port 1 View  Result Date: 01/13/2020 CLINICAL DATA:  86 year old male with shortness of breath. Possible exposure to COVID-19. EXAM: PORTABLE CHEST 1 VIEW COMPARISON:  Chest radiographs 01/06/2020 and earlier. FINDINGS: Portable AP semi upright view at 0545 hours. Sequelae of TAVR. Stable cardiac size and mediastinal contours. Heart size at the upper limits of normal. Visualized tracheal air column is within normal limits. Mildly lower lung volumes. Allowing for portable technique the lungs are clear. No pneumothorax or pleural effusion. Visible bowel gas increased in the upper abdomen but remains within normal limits. No acute osseous abnormality identified. IMPRESSION: No acute cardiopulmonary  abnormality. Electronically Signed   By: Genevie Ann M.D.   On: 01/13/2020 06:00    Procedures .Critical Care Performed by: Merrily Pew, MD Authorized by: Merrily Pew, MD   Critical care provider statement:    Critical care time (minutes):  45   Critical care was time spent personally by me on the following activities:  Discussions with consultants, evaluation of patient's response to treatment, examination of patient, ordering and performing treatments and interventions, ordering and review of laboratory studies, ordering and review of radiographic studies, pulse oximetry, re-evaluation of patient's condition, obtaining history from patient or surrogate and review of old charts   (including critical care time)  Medications Ordered in ED Medications  lactated ringers infusion (has no administration in time range)  aspirin chewable tablet 324 mg (324 mg Oral Given 01/13/20 0633)  lactated ringers bolus 1,000 mL (1,000 mLs Intravenous New Bag/Given 01/13/20 0640)    ED Course  I have reviewed the triage vital signs and the nursing notes.  Pertinent labs & imaging results that were available during my care of the patient were reviewed by me and considered in my medical decision making (see chart for details).    MDM Rules/Calculators/A&P                         eval for PNA vs abdominal causes vs CHF  Vs PE vs covid. With hypoxia, will likely need admitted.  Initially the nursing tech informed me that the bladder scan showed over 700 cc of fluid so a Foley catheter was placed but only returned about 150  cc of very dark urine.  His son showed up and stated that he had diarrhea for all of Saturday and Sunday and even into Monday.  Started having some suprapubic pressure on Monday afternoon progressively worsened throughout the night that is why they brought him here.  He noticed the wheezing while in the triage area.  Initially been going with a working diagnosis of obstructive nephropathy  and fluid overload secondary to the same especially with his significant elevated creatinine.  However with all this new information it seems more likely that his creatinine is from dehydration however this does not explain crackles and wheezing mild hypoxia elevated troponin and BNP.  We will give a bit of fluid for the kidney function.  Discussed with hospitalist for admission and further management work-up of the situation.   Final Clinical Impression(s) / ED Diagnoses Final diagnoses:  SOB (shortness of breath)  Hypoxia    Rx / DC Orders ED Discharge Orders    None       Giles Currie, Corene Cornea, MD 01/13/20 [phone]

## 2020-01-13 NOTE — ED Notes (Signed)
B/P cuff readjusted and changed

## 2020-01-13 NOTE — ED Notes (Signed)
Having difficulty swallowing po water and milk - coughing and choking. Also difficulty with po pills. Dr. Marisa Severin notified with N.O. Speech eval. Will cont to monitor

## 2020-01-13 NOTE — ED Triage Notes (Addendum)
Pt c/o sob and urinary frequency since yesterday. Pt states his family is believed to have COVID but no one has been tested.

## 2020-01-13 NOTE — ED Notes (Addendum)
CRITICAL VALUE ALERT  Critical Value:  Troponin 123  Date & Time Notied:  01/13/20 0858  Provider Notified: Dr. Marisa Severin  Orders Received/Actions taken: MD & Harvin Hazel, primary RN notified

## 2020-01-13 NOTE — Progress Notes (Signed)
ANTICOAGULATION CONSULT NOTE - Initial Consult  Pharmacy Consult for heparin Indication: suspected PE   No Known Allergies  Patient Measurements: Height: 5\' 10"  (177.8 cm) Weight: 85.3 kg (188 lb) IBW/kg (Calculated) : 73 Heparin Dosing Weight: 85 kg  Vital Signs: BP: 145/76 (12/28 1800) Pulse Rate: 101 (12/28 1800)  Labs: Recent Labs    01/13/20 0510 01/13/20 0723 01/13/20 1423  HGB 15.4  --   --   HCT 44.4  --   --   PLT 134*  --   --   CREATININE 6.19*  --  3.30*  TROPONINIHS 115* 123*  --     Estimated Creatinine Clearance: 16.6 mL/min (A) (by C-G formula based on SCr of 3.3 mg/dL (H)).   Medical History: Past Medical History:  Diagnosis Date  . Arthritis   . Brain mass    right lateral frontotemporal extra-axial mass 7.1 cm with typical features of meningioma 02/28/18 (to follow-up w/ neurosurgeon Dr. 03/02/18)  . Cancer [facility])    h/o  skin  cancer  . Coronary artery disease    s/p DES to LAD (02/19/18)  . Ex-smoker   . GERD (gastroesophageal reflux disease)   . Glaucoma 01/13/2020  . Hypercholesteremia   . Hypertension   . Lumbar stenosis   . OSA (obstructive sleep apnea)   . Pancreatic duct dilated   . Parkinson's disease ([facility])   . S/P TAVR (transcatheter aortic valve replacement)   . Severe aortic stenosis      Assessment: 86 yo M with suspected PE given D-dimer 14, dyspnea. Pharmacy consulted for heparin without bolus. No anticoagulation PTA.   VQ scan pending. H/H 14, plt stable. ClCr 16 ml/min but Scr trending down.   Goal of Therapy:  Heparin level 0.3-0.7 units/ml Monitor platelets by anticoagulation protocol: Yes   Plan:  Start heparin 1300 units/hr, no bolus per MD  F/u 6hr HL  Monitor daily HL, CBC/plt Monitor for signs/symptoms of bleeding  F/u VQ scan, AC plan  88, PharmD, BCPS, BCCP Clinical Pharmacist  Please check AMION for all [facility] Pharmacy phone numbers After 10:00 PM, call Main Pharmacy [phone]

## 2020-01-13 NOTE — ED Notes (Addendum)
Date and time results received: 01/13/20 0552  Test: Troponin Critical Value: 115  Name of Provider Notified: Messner  Orders Received? Or Actions Taken?: Orders Received - See Orders for details

## 2020-01-13 NOTE — ED Notes (Signed)
I assumed care of patient at this time and introduced self to patient. Pt denied any needs at this time and appears to be in NAD. Call bell within reach, bed in low position. Will continue to monitor. 

## 2020-01-13 NOTE — ED Notes (Signed)
Son, Marcy Salvo, called and informed that patient got an inpatient bed and would be moving upstairs. He stated that he'll be back in the morning to see him.

## 2020-01-14 ENCOUNTER — Inpatient Hospital Stay (HOSPITAL_COMMUNITY): Payer: Medicare Other

## 2020-01-14 ENCOUNTER — Observation Stay (HOSPITAL_COMMUNITY): Payer: Medicare Other

## 2020-01-14 DIAGNOSIS — I2510 Atherosclerotic heart disease of native coronary artery without angina pectoris: Secondary | ICD-10-CM | POA: Diagnosis present

## 2020-01-14 DIAGNOSIS — R9431 Abnormal electrocardiogram [ECG] [EKG]: Secondary | ICD-10-CM

## 2020-01-14 DIAGNOSIS — I4891 Unspecified atrial fibrillation: Secondary | ICD-10-CM | POA: Diagnosis present

## 2020-01-14 DIAGNOSIS — R7989 Other specified abnormal findings of blood chemistry: Secondary | ICD-10-CM | POA: Diagnosis not present

## 2020-01-14 DIAGNOSIS — R509 Fever, unspecified: Secondary | ICD-10-CM | POA: Diagnosis not present

## 2020-01-14 DIAGNOSIS — N179 Acute kidney failure, unspecified: Secondary | ICD-10-CM

## 2020-01-14 DIAGNOSIS — G20 Parkinson's disease: Secondary | ICD-10-CM | POA: Diagnosis not present

## 2020-01-14 DIAGNOSIS — N401 Enlarged prostate with lower urinary tract symptoms: Secondary | ICD-10-CM | POA: Diagnosis present

## 2020-01-14 DIAGNOSIS — R4182 Altered mental status, unspecified: Secondary | ICD-10-CM | POA: Diagnosis not present

## 2020-01-14 DIAGNOSIS — I2699 Other pulmonary embolism without acute cor pulmonale: Secondary | ICD-10-CM | POA: Diagnosis present

## 2020-01-14 DIAGNOSIS — G3184 Mild cognitive impairment, so stated: Secondary | ICD-10-CM | POA: Diagnosis present

## 2020-01-14 DIAGNOSIS — K219 Gastro-esophageal reflux disease without esophagitis: Secondary | ICD-10-CM | POA: Diagnosis not present

## 2020-01-14 DIAGNOSIS — Z952 Presence of prosthetic heart valve: Secondary | ICD-10-CM | POA: Diagnosis not present

## 2020-01-14 DIAGNOSIS — G4733 Obstructive sleep apnea (adult) (pediatric): Secondary | ICD-10-CM | POA: Diagnosis present

## 2020-01-14 DIAGNOSIS — Z86011 Personal history of benign neoplasm of the brain: Secondary | ICD-10-CM | POA: Diagnosis not present

## 2020-01-14 DIAGNOSIS — J439 Emphysema, unspecified: Secondary | ICD-10-CM | POA: Diagnosis not present

## 2020-01-14 DIAGNOSIS — I10 Essential (primary) hypertension: Secondary | ICD-10-CM | POA: Diagnosis not present

## 2020-01-14 DIAGNOSIS — X58XXXA Exposure to other specified factors, initial encounter: Secondary | ICD-10-CM | POA: Diagnosis present

## 2020-01-14 DIAGNOSIS — F0280 Dementia in other diseases classified elsewhere without behavioral disturbance: Secondary | ICD-10-CM | POA: Diagnosis not present

## 2020-01-14 DIAGNOSIS — R310 Gross hematuria: Secondary | ICD-10-CM | POA: Diagnosis not present

## 2020-01-14 DIAGNOSIS — R0602 Shortness of breath: Secondary | ICD-10-CM | POA: Diagnosis not present

## 2020-01-14 DIAGNOSIS — G9341 Metabolic encephalopathy: Secondary | ICD-10-CM | POA: Diagnosis not present

## 2020-01-14 DIAGNOSIS — E876 Hypokalemia: Secondary | ICD-10-CM | POA: Diagnosis present

## 2020-01-14 DIAGNOSIS — Z20822 Contact with and (suspected) exposure to covid-19: Secondary | ICD-10-CM | POA: Diagnosis not present

## 2020-01-14 DIAGNOSIS — Z955 Presence of coronary angioplasty implant and graft: Secondary | ICD-10-CM | POA: Diagnosis not present

## 2020-01-14 DIAGNOSIS — I480 Paroxysmal atrial fibrillation: Secondary | ICD-10-CM | POA: Diagnosis not present

## 2020-01-14 DIAGNOSIS — E7800 Pure hypercholesterolemia, unspecified: Secondary | ICD-10-CM | POA: Diagnosis present

## 2020-01-14 DIAGNOSIS — D329 Benign neoplasm of meninges, unspecified: Secondary | ICD-10-CM | POA: Diagnosis not present

## 2020-01-14 DIAGNOSIS — J189 Pneumonia, unspecified organism: Secondary | ICD-10-CM | POA: Diagnosis not present

## 2020-01-14 DIAGNOSIS — I110 Hypertensive heart disease with heart failure: Secondary | ICD-10-CM | POA: Diagnosis not present

## 2020-01-14 DIAGNOSIS — E785 Hyperlipidemia, unspecified: Secondary | ICD-10-CM | POA: Diagnosis present

## 2020-01-14 DIAGNOSIS — I5032 Chronic diastolic (congestive) heart failure: Secondary | ICD-10-CM | POA: Diagnosis not present

## 2020-01-14 DIAGNOSIS — N138 Other obstructive and reflux uropathy: Secondary | ICD-10-CM | POA: Diagnosis not present

## 2020-01-14 DIAGNOSIS — R279 Unspecified lack of coordination: Secondary | ICD-10-CM | POA: Diagnosis not present

## 2020-01-14 DIAGNOSIS — R0902 Hypoxemia: Secondary | ICD-10-CM | POA: Diagnosis present

## 2020-01-14 DIAGNOSIS — Z7401 Bed confinement status: Secondary | ICD-10-CM | POA: Diagnosis not present

## 2020-01-14 LAB — HEPARIN LEVEL (UNFRACTIONATED)
Heparin Unfractionated: 0.29 IU/mL — ABNORMAL LOW (ref 0.30–0.70)
Heparin Unfractionated: 0.27 IU/mL — ABNORMAL LOW (ref 0.30–0.70)
Heparin Unfractionated: 0.38 IU/mL (ref 0.30–0.70)

## 2020-01-14 LAB — ECHOCARDIOGRAM COMPLETE
AV Mean grad: 9.3 mmHg
AV Peak grad: 15.8 mmHg
Ao pk vel: 1.99 m/s
Area-P 1/2: 2.92 cm2
Height: 70 in
P 1/2 time: 387 msec
S' Lateral: 2.6 cm
Weight: 3008 oz

## 2020-01-14 LAB — CBC
HCT: 39.1 % (ref 39.0–52.0)
Hemoglobin: 13.4 g/dL (ref 13.0–17.0)
MCH: 34.6 pg — ABNORMAL HIGH (ref 26.0–34.0)
MCHC: 34.3 g/dL (ref 30.0–36.0)
MCV: 101 fL — ABNORMAL HIGH (ref 80.0–100.0)
Platelets: 115 10*3/uL — ABNORMAL LOW (ref 150–400)
RBC: 3.87 MIL/uL — ABNORMAL LOW (ref 4.22–5.81)
RDW: 13.4 % (ref 11.5–15.5)
WBC: 11.6 10*3/uL — ABNORMAL HIGH (ref 4.0–10.5)
nRBC: 0 % (ref 0.0–0.2)

## 2020-01-14 LAB — BASIC METABOLIC PANEL
Anion gap: 11 (ref 5–15)
BUN: 32 mg/dL — ABNORMAL HIGH (ref 8–23)
CO2: 23 mmol/L (ref 22–32)
Calcium: 8.7 mg/dL — ABNORMAL LOW (ref 8.9–10.3)
Chloride: 105 mmol/L (ref 98–111)
Creatinine, Ser: 1.28 mg/dL — ABNORMAL HIGH (ref 0.61–1.24)
GFR, Estimated: 55 mL/min — ABNORMAL LOW (ref >60)
Glucose, Bld: 119 mg/dL — ABNORMAL HIGH (ref 70–99)
Potassium: 3.7 mmol/L (ref 3.5–5.1)
Sodium: 139 mmol/L (ref 135–145)

## 2020-01-14 MED ORDER — CARBIDOPA-LEVODOPA 25-100 MG PO TABS
1.0000 | ORAL_TABLET | Freq: Once | ORAL | Status: AC
  Administered 2020-01-14: 13:00:00 1 via ORAL
  Filled 2020-01-14: qty 1

## 2020-01-14 MED ORDER — CHLORHEXIDINE GLUCONATE CLOTH 2 % EX PADS
6.0000 | MEDICATED_PAD | Freq: Every day | CUTANEOUS | Status: DC
  Administered 2020-01-15 – 2020-01-18 (×4): 6 via TOPICAL

## 2020-01-14 MED ORDER — CARBIDOPA-LEVODOPA 25-100 MG PO TABS
1.0000 | ORAL_TABLET | Freq: Three times a day (TID) | ORAL | Status: DC
  Administered 2020-01-14 – 2020-01-18 (×14): 1 via ORAL
  Filled 2020-01-14 (×13): qty 1

## 2020-01-14 MED ORDER — TECHNETIUM TO 99M ALBUMIN AGGREGATED
4.4000 | Freq: Once | INTRAVENOUS | Status: AC | PRN
  Administered 2020-01-14: 08:00:00 4.4 via INTRAVENOUS

## 2020-01-14 MED ORDER — CARBIDOPA-LEVODOPA 25-100 MG PO TABS
2.0000 | ORAL_TABLET | Freq: Three times a day (TID) | ORAL | Status: DC
  Administered 2020-01-14 – 2020-01-18 (×13): 2 via ORAL
  Filled 2020-01-14 (×11): qty 2

## 2020-01-14 NOTE — Progress Notes (Signed)
Patient declined CPAP. Patient is also in mittens at this time so CPAP is contraindicated.

## 2020-01-14 NOTE — Plan of Care (Signed)
°  Problem: SLP Dysphagia Goals Goal: Patient will utilize recommended strategies Description: Patient will utilize recommended strategies during swallow to increase swallowing safety with Flowsheets (Taken 01/14/2020 1413) Patient will utilize recommended strategies during swallow to increase swallowing safety with:  max assist  mod assist   Thank you, Skyleigh Windle H. Romie Levee, CCC-SLP Speech Language Pathologist

## 2020-01-14 NOTE — Progress Notes (Signed)
ANTICOAGULATION CONSULT NOTE  Pharmacy Consult for heparin Indication: suspected PE   No Known Allergies  Patient Measurements: Height: 5\' 10"  (177.8 cm) Weight: 85.3 kg (188 lb) IBW/kg (Calculated) : 73 Heparin Dosing Weight: 85 kg  Vital Signs: Temp: 99.2 F (37.3 C) (12/29 0014) Temp Source: Oral (12/29 0014) BP: 165/73 (12/29 0014) Pulse Rate: 105 (12/29 0014)  Labs: Recent Labs    01/13/20 0510 01/13/20 0723 01/13/20 1423 01/14/20 0205  HGB 15.4  --   --   --   HCT 44.4  --   --   --   PLT 134*  --   --   --   HEPARINUNFRC  --   --   --  0.29*  CREATININE 6.19*  --  3.30*  --   TROPONINIHS 115* 123*  --   --     Estimated Creatinine Clearance: 16.6 mL/min (A) (by C-G formula based on SCr of 3.3 mg/dL (H)).   Assessment: 86 yo M with suspected PE given D-dimer 14, dyspnea. Pharmacy consulted for heparin without bolus. No anticoagulation PTA.   VQ scan pending. H/H 14, plt stable. ClCr 16 ml/min but Scr trending down.  Initial heparin level 0.29 units/ml  Goal of Therapy:  Heparin level 0.3-0.7 units/ml Monitor platelets by anticoagulation protocol: Yes   Plan:  Increase heparin to 1400 units/hr F/u 6hr HL  Monitor daily HL, CBC/plt Monitor for signs/symptoms of bleeding  F/u VQ scan, [facility] plan  Thanks for allowing pharmacy to be a part of this patient's care.  [facility], PharmD Clinical Pharmacist

## 2020-01-14 NOTE — Plan of Care (Signed)
  Problem: Acute Rehab PT Goals(only PT should resolve) Goal: Pt Will Go Supine/Side To Sit Outcome: Progressing Flowsheets (Taken 01/14/2020 1417) Pt will go Supine/Side to Sit: with minimal assist Goal: Patient Will Transfer Sit To/From Stand Outcome: Progressing Flowsheets (Taken 01/14/2020 1417) Patient will transfer sit to/from stand: with minimal assist Goal: Pt Will Transfer Bed To Chair/Chair To Bed Outcome: Progressing Flowsheets (Taken 01/14/2020 1417) Pt will Transfer Bed to Chair/Chair to Bed: with min assist Goal: Pt Will Ambulate Outcome: Progressing Flowsheets (Taken 01/14/2020 1417) Pt will Ambulate:  25 feet  with minimal assist  with moderate assist  with rolling walker   2:17 PM, 01/14/20 Ocie Bob, MPT Physical Therapist with [facility] [phone] office [phone] mobile phone

## 2020-01-14 NOTE — Progress Notes (Signed)
[facility] PROGRESS NOTE    [name]  [id number] DOB: 26-Nov-1931 DOA: 01/13/2020 PCP: Celene Squibb, MD      Brief Narrative:  [name] is a 86 y.o. M with Parkinson's disease, CAD s/p PCI last 02/2018, TAVR in 03/2018, OSA, meningioma and HTN who presented with diarrhea for 2 days and malaise/confusion.  Patient is normally ambulatory, without evidence of dementia.  3 days prior to admission, he developed frequent loose and watery diarrhea.  Over the next 24 to 48 hours, he became weaker and weaker until he could get up from a chair, and then seemed confused, so family brought him to the ER.  In the ER, he was weak and confused, also noted to be hypoxic and dyspneic.  D-dimer very elevated.  Cr noted to be >6 and bladder scan showed retention.  Foley placed and he was started on empiric heparin for PE, and admitted for IV fluids.       Assessment & Plan:  Acute pulmonary embolism Bilateral lower extremity Doppler studies show no evidence of DVT.  Echo pending.  Pesi score very high risk given age, hypoxia, HR. -Continue heparin -Follow echo   Acute renal failure Due to obstruction. Improving with fluids -Monitor renal output and continue IV fluids -Hold losartan  Acute metabolic encephalopathy With some very mild cognitive impairment per sons, this is currently within range of expected given his PE, recent renal failure, and Sinemet deficiency over the last 24 hours. CT head unchanged. Gradually worse over last 24 hours, I suspect due to Sinemet doses missed.  Do not suspect stroke or seizures at this point.  Hematuria Discussed with Urology, likely from urothelial injury with obstruction, likely to clear. -Replace foley with 9F -Manual flushes as needed -Stop aspirin (last DES >12 months) -Monitor closely -Trend Hgb  Parkinson's disease -Continue Sinemet and Mirapex  Coronary disease secondary prevention Severe aortic stenosis  status post TAVR -Hold aspirin -Continue amlodipine, atorvastatin, metoprolol -Hold losartan  Sleep apnea -Continue CPAP  Meningioma Stable on MRI  GERD -Continue PPI          Disposition: Status is: Inpatient  Remains inpatient appropriate because:IV treatments appropriate due to intensity of illness or inability to take PO   Dispo: The patient is from: Home              Anticipated d/c is to: SNF              Anticipated d/c date is: 3 days              Patient currently is not medically stable to d/c.              MDM: The below labs and imaging reports were reviewed and summarized above.  Medication management as above.  Severe illness with threat to life, blood thinner managed   DVT prophylaxis: SCDs Start: 01/13/20 0837 Place TED hose Start: 01/13/20 0837  Code Status: FULL Family Communication: Sons by phone and in room    Consultants:   Urology           Subjective: Patient is somewhat somnolent, tremor seems marked, bradykinesia seems marked.  He is very sleepy.  No fever.  No coughing.  He has hematuria.  His diarrhea has stopped.  He has no chest pain.  No syncope.  Objective: Vitals:   01/13/20 2300 01/14/20 0014 01/14/20 0535 01/14/20 1503  BP: (!) 144/85 (!) 165/73 (!) 165/93 133/76  Pulse: 98 (!)  105 (!) 101 90  Resp: 14 19 20 18   Temp:  99.2 F (37.3 C) 99.1 F (37.3 C) 98.6 F (37 C)  TempSrc:  Oral    SpO2: 100% 95% 93% 96%  Weight:      Height:        Intake/Output Summary (Last 24 hours) at 01/14/2020 1735 Last data filed at 01/14/2020 1430 Gross per 24 hour  Intake 2054.6 ml  Output 700 ml  Net 1354.6 ml   Filed Weights   01/13/20 0358  Weight: 85.3 kg    Examination: General appearance: elderly adult male, awake but somnolent,. Answers appropriately, but limited. HEENT: Anicteric, conjunctiva pink, lids and lashes normal. No nasal deformity, discharge, epistaxis.  Lips dry, dentition in good  repair, OP dry, no oral lesions, hearing seems normal.   Skin: Warm and dry.   Cardiac: Tachycardic, regular, nl S1-S2, no murmurs appreciated.  Capillary refill is brisk.  JVP not visible.  Trace bilateral LE edema.  Radial pulses 2+ and symmetric. Respiratory: Shallow but normal rate, no rales or wheezing that I appreciate Abdomen: Abdomen soft.  No TTP or guaridng. No ascites, distension, hepatosplenomegaly.   MSK: No deformities or effusions. Neuro: Awake but bradykinetic.  Speech slurred. Coarse tremor noted.  Rigidity noted.   Psych: Sensorium intact and responding to questions, attention diminshed. Affect blunted.  Judgment and insight appear impaired.    Data Reviewed: I have personally reviewed following labs and imaging studies:  CBC: Recent Labs  Lab 01/13/20 0510 01/14/20 0205  WBC 17.8* 11.6*  NEUTROABS 15.1*  --   HGB 15.4 13.4  HCT 44.4 39.1  MCV 101.4* 101.0*  PLT 134* [id number]*   Basic Metabolic Panel: Recent Labs  Lab 01/13/20 0510 01/13/20 1423 01/14/20 0205  NA 135 137 139  K 4.3 3.7 3.7  CL 101 102 105  CO2 20* 24 23  GLUCOSE 141* 123* 119*  BUN 65* 52* 32*  CREATININE 6.19* 3.30* 1.28*  CALCIUM 8.9 8.7* 8.7*  PHOS  --  3.6  --    GFR: Estimated Creatinine Clearance: 42.8 mL/min (A) (by C-G formula based on SCr of 1.28 mg/dL (H)). Liver Function Tests: Recent Labs  Lab 01/13/20 0510 01/13/20 1423  AST 17  --   ALT 6  --   ALKPHOS 71  --   BILITOT 1.1  --   PROT 6.7  --   ALBUMIN 3.6 3.2*   No results for input(s): LIPASE, AMYLASE in the last 168 hours. No results for input(s): AMMONIA in the last 168 hours. Coagulation Profile: No results for input(s): INR, PROTIME in the last 168 hours. Cardiac Enzymes: No results for input(s): CKTOTAL, CKMB, CKMBINDEX, TROPONINI in the last 168 hours. BNP (last 3 results) No results for input(s): PROBNP in the last 8760 hours. HbA1C: No results for input(s): HGBA1C in the last 72 hours. CBG: No  results for input(s): GLUCAP in the last 168 hours. Lipid Profile: Recent Labs    01/13/20 0511  TRIG 88   Thyroid Function Tests: No results for input(s): TSH, T4TOTAL, FREET4, T3FREE, THYROIDAB in the last 72 hours. Anemia Panel: Recent Labs    01/13/20 0511  FERRITIN 173   Urine analysis:    Component Value Date/Time   COLORURINE YELLOW 01/13/2020 [place] 01/13/2020 0717   LABSPEC 1.019 01/13/2020 0717   PHURINE 5.0 01/13/2020 0717   GLUCOSEU NEGATIVE 01/13/2020 0717   HGBUR LARGE (A) 01/13/2020 0717   BILIRUBINUR NEGATIVE 01/13/2020 0717  KETONESUR 5 (A) 01/13/2020 0717   PROTEINUR 30 (A) 01/13/2020 0717   NITRITE NEGATIVE 01/13/2020 0717   LEUKOCYTESUR NEGATIVE 01/13/2020 0717   Sepsis Labs: @LABRCNTIP (procalcitonin:4,lacticacidven:4)  ) Recent Results (from the past 240 hour(s))  Resp Panel by RT-PCR (Flu A&B, Covid) Nasopharyngeal Swab     Status: None   Collection Time: 01/13/20  4:20 AM   Specimen: Nasopharyngeal Swab; Nasopharyngeal(NP) swabs in vial transport medium  Result Value Ref Range Status   SARS Coronavirus 2 by RT PCR NEGATIVE NEGATIVE Final    Comment: (NOTE) SARS-CoV-2 target nucleic acids are NOT DETECTED.  The SARS-CoV-2 RNA is generally detectable in upper respiratory specimens during the acute phase of infection. The lowest concentration of SARS-CoV-2 viral copies this assay can detect is 138 copies/mL. A negative result does not preclude SARS-Cov-2 infection and should not be used as the sole basis for treatment or other patient management decisions. A negative result may occur with  improper specimen collection/handling, submission of specimen other than nasopharyngeal swab, presence of viral mutation(s) within the areas targeted by this assay, and inadequate number of viral copies(<138 copies/mL). A negative result must be combined with clinical observations, patient history, and epidemiological information. The  expected result is Negative.  Fact Sheet for Patients:  EntrepreneurPulse.com.au  Fact Sheet for Healthcare Providers:  IncredibleEmployment.be  This test is no t yet approved or cleared by the [place] FDA and  has been authorized for detection and/or diagnosis of SARS-CoV-2 by FDA under an Emergency Use Authorization (EUA). This EUA will remain  in effect (meaning this test can be used) for the duration of the COVID-19 declaration under Section 564(b)(1) of the Act, 21 U.S.C.section 360bbb-3(b)(1), unless the authorization is terminated  or revoked sooner.       Influenza A by PCR NEGATIVE NEGATIVE Final   Influenza B by PCR NEGATIVE NEGATIVE Final    Comment: (NOTE) The Xpert Xpress SARS-CoV-2/FLU/RSV plus assay is intended as an aid in the diagnosis of influenza from Nasopharyngeal swab specimens and should not be used as a sole basis for treatment. Nasal washings and aspirates are unacceptable for Xpert Xpress SARS-CoV-2/FLU/RSV testing.  Fact Sheet for Patients: EntrepreneurPulse.com.au  Fact Sheet for Healthcare Providers: IncredibleEmployment.be  This test is not yet approved or cleared by the [place] FDA and has been authorized for detection and/or diagnosis of SARS-CoV-2 by FDA under an Emergency Use Authorization (EUA). This EUA will remain in effect (meaning this test can be used) for the duration of the COVID-19 declaration under Section 564(b)(1) of the Act, 21 U.S.C. section 360bbb-3(b)(1), unless the authorization is terminated or revoked.  Performed at [facility], [address], [place], [place] [address]   Blood Culture (routine x 2)     Status: None (Preliminary result)   Collection Time: 01/13/20  6:07 AM   Specimen: BLOOD RIGHT FOREARM  Result Value Ref Range Status   Specimen Description BLOOD RIGHT FOREARM  Final   Special Requests   Final    BOTTLES DRAWN AEROBIC  AND ANAEROBIC Blood Culture adequate volume   Culture   Final    NO GROWTH <12 HOURS Performed at [facility], [address] [place], [place] [address]    Report Status PENDING  Incomplete  Blood Culture (routine x 2)     Status: None (Preliminary result)   Collection Time: 01/13/20  6:14 AM   Specimen: BLOOD  Result Value Ref Range Status   Specimen Description BLOOD LEFT ANTECUBITAL  Final  Special Requests   Final    BOTTLES DRAWN AEROBIC AND ANAEROBIC Blood Culture adequate volume   Culture   Final    NO GROWTH <12 HOURS Performed at [facility], [address] [place], [place] [address]    Report Status PENDING  Incomplete         Radiology Studies: CT ABDOMEN PELVIS WO CONTRAST  Result Date: 01/13/2020 CLINICAL DATA:  Diarrhea EXAM: CT ABDOMEN AND PELVIS WITHOUT CONTRAST TECHNIQUE: Multidetector CT imaging of the abdomen and pelvis was performed following the standard protocol without IV contrast. COMPARISON:  MRI 12/05/2018, CT 02/28/2018 FINDINGS: Lower chest: Significant motion degradation. Mild scarring and fibrosis at the bases. Mild bronchiectasis in the lower lobes and right middle lobe. Cardiomegaly with aortic valve prosthesis. Coronary vascular calcification. Moderate hiatal hernia. Hepatobiliary: Hepatic cyst. No calcified gallstone or biliary dilatation Pancreas: Unremarkable. No pancreatic ductal dilatation or surrounding inflammatory changes. Spleen: Normal in size without focal abnormality. Adrenals/Urinary Tract: Adrenal glands are grossly normal. Hypodense lesion within the upper pole left kidney likely a cyst. No definitive hydronephrosis. Small amount of left left greater than right perinephric fluid. Ill-defined appearance of the left renal pelvis. Foley catheter in the urinary bladder which is decompressed. The urinary bladder appears thick walled. Small amount of gas in the bladder. Stomach/Bowel: The stomach is nonenlarged. No dilated small bowel. No acute  bowel wall thickening. Moderate stool in the right colon. Possible mild left eccentric wall thickening at the rectum, series 2, image number 69. Vascular/Lymphatic: Advanced aortic atherosclerosis. Aneurysmal dilatation of the infrarenal abdominal aorta measuring 5.4 by 4.6 cm, previously 4.4 x 4.9 cm. Aneurysmal dilatation of right common iliac artery measuring 2.5 cm. No suspicious adenopathy. Reproductive: Prostate is enlarged. Prostate contains calcifications Other: Small amount of presacral fluid.  No free air. Musculoskeletal: Surgical hardware at L4-L5 with similar appearance of the hardware. Right L4 transpedicular screw tip touches the superior endplate of L4, left screwed tip at L4 is adjacent to the posterolateral margin of the L3-L4 disc space. IMPRESSION: 1. Significant motion degradation limits the examination. 2. Moderate stool in the right colon. No acute wall thickening is seen. Possible mild left eccentric wall thickening of the rectum which should be correlated with physical exam. 3. Mild to moderate left perinephric edema. Ill-defined hazy density at the left renal hilus raising possibility of inflammatory process/ascending urinary tract infection though further evaluation is limited due to significant motion degradation. Bladder is decompressed but thick-walled which may be due to cystitis or chronic obstruction. Suggest correlation with urinalysis. 4. Slight interval increase in size of infrarenal abdominal aortic aneurysm measuring 5.4 by 4.6 cm, previously 4.9 x 4.4 cm. Recommend follow-up CT/MR every 6 months and vascular consultation Aortic Atherosclerosis (ICD10-I70.0). Electronically Signed   By: Jasmine Pang M.D.   On: 01/13/2020 19:58   CT HEAD WO CONTRAST  Result Date: 01/13/2020 CLINICAL DATA:  Neuro deficit, acute, stroke suspected Swallowing difficulty and lower extremity weakness. EXAM: CT HEAD WITHOUT CONTRAST TECHNIQUE: Contiguous axial images were obtained from the base of  the skull through the vertex without intravenous contrast. COMPARISON:  Head CT 04/16/2019, brain MRI 01/15/2019 FINDINGS: Brain: Stable size and appearance of the large right extra-axial lesion in the middle cranial fossa measuring approximately 7.0 x 3.1 x 6 cm, primarily isodose slightly hyperdense to adjacent brain parenchyma. There is similar mass effect on the adjacent structures with stable effacement of the right lateral ventricle and chronic 5 mm right to left midline shift. No evidence of  acute or intralesional hemorrhage. No evidence of acute ischemia. The basilar cisterns are patent. No subdural collection. Vascular: Atherosclerosis of skullbase vasculature without hyperdense vessel or abnormal calcification. Skull: Mild heterogeneity of the skull wall subjacent to meningioma. No skull fracture or acute finding. Sinuses/Orbits: Paranasal sinuses and mastoid air cells are clear. The visualized orbits are unremarkable. Bilateral cataract resection. Other: None. IMPRESSION: 1. No acute intracranial abnormality. 2. Stable size and appearance of the large right middle cranial fossa meningioma with similar mass effect on the adjacent structures and chronic 5 mm right to left midline shift. Electronically Signed   By: Narda RutherfordMelanie  Sanford M.D.   On: 01/13/2020 18:28   NM Pulmonary Perfusion  Addendum Date: 01/14/2020   ADDENDUM REPORT: 01/14/2020 09:48 ADDENDUM: Critical Value/emergent results were called by telephone at the time of interpretation on 01/14/2020 at 9:47 am to provider Facey Medical FoundationCHRISTOPHER Eissa Buchberger, who verbally acknowledged these results. Electronically Signed   By: Delbert PhenixJason A Poff M.D.   On: 01/14/2020 09:48   Result Date: 01/14/2020 CLINICAL DATA:  Inpatient.  Ed elevated D-dimer. EXAM: NUCLEAR MEDICINE PERFUSION LUNG SCAN TECHNIQUE: Perfusion images were obtained in multiple projections after intravenous injection of radiopharmaceutical. Ventilation scans intentionally deferred if perfusion scan and  chest x-ray adequate for interpretation during COVID 19 epidemic. RADIOPHARMACEUTICALS:  4.4 mCi Tc-55103m MAA IV COMPARISON:  Chest radiograph from one day prior. FINDINGS: Multiple bilateral moderate to large segmental perfusion defects, most prominent in the peripheral right mid lung. IMPRESSION: Multiple bilateral moderate to large segmental perfusion defects, considered high probability for acute pulmonary embolism. Electronically Signed: By: Delbert PhenixJason A Poff M.D. On: 01/14/2020 09:29   US RENAL  Result Date: 01/13/2020 CLINICAL DATA:  Acute kidney injury EXAM: RENAL / URINARY TRACT ULTRASOUND COMPLETE COMPARISON:  Ultrasound abdomen 06/05/2014.  MRI abdomen 12/05/2018 FINDINGS: Right Kidney: Renal measurements: 11.2 x 5.1 x 5.7 cm = volume: 169 mL. Echogenicity within normal limits. No mass or hydronephrosis visualized. Left Kidney: Renal measurements: 12.0 x 5.5 x 5.2 cm = volume: 206 mL. 2.5 cm left upper pole cyst stable from prior studies. Echogenicity within normal limits. No mass or hydronephrosis visualized. Bladder: Foley catheter with empty urinary bladder. Other: Bedside examination. IMPRESSION: Normal renal size and echogenicity.  No renal obstruction. Electronically Signed   By: Marlan Palauharles  Clark M.D.   On: 01/13/2020 10:01   US Venous Img Lower Bilateral (DVT)  Result Date: 01/14/2020 CLINICAL DATA:  Pulmonary emboli, shortness of breath, elevated D-dimer EXAM: BILATERAL LOWER EXTREMITY VENOUS DOPPLER ULTRASOUND TECHNIQUE: Gray-scale sonography with graded compression, as well as color Doppler and duplex ultrasound were performed to evaluate the lower extremity deep venous systems from the level of the common femoral vein and including the common femoral, femoral, profunda femoral, popliteal and calf veins including the posterior tibial, peroneal and gastrocnemius veins when visible. The superficial great saphenous vein was also interrogated. Spectral Doppler was utilized to evaluate flow at rest  and with distal augmentation maneuvers in the common femoral, femoral and popliteal veins. COMPARISON:  None. FINDINGS: RIGHT LOWER EXTREMITY Common Femoral Vein: No evidence of thrombus. Normal compressibility, respiratory phasicity and response to augmentation. Saphenofemoral Junction: No evidence of thrombus. Normal compressibility and flow on color Doppler imaging. Profunda Femoral Vein: No evidence of thrombus. Normal compressibility and flow on color Doppler imaging. Femoral Vein: No evidence of thrombus. Normal compressibility, respiratory phasicity and response to augmentation. Popliteal Vein: No evidence of thrombus. Normal compressibility, respiratory phasicity and response to augmentation. Calf Veins: No evidence of thrombus. Normal compressibility and  flow on color Doppler imaging. LEFT LOWER EXTREMITY Common Femoral Vein: No evidence of thrombus. Normal compressibility, respiratory phasicity and response to augmentation. Saphenofemoral Junction: No evidence of thrombus. Normal compressibility and flow on color Doppler imaging. Profunda Femoral Vein: No evidence of thrombus. Normal compressibility and flow on color Doppler imaging. Femoral Vein: No evidence of thrombus. Normal compressibility, respiratory phasicity and response to augmentation. Popliteal Vein: No evidence of thrombus. Normal compressibility, respiratory phasicity and response to augmentation. Calf Veins: No evidence of thrombus. Normal compressibility and flow on color Doppler imaging. IMPRESSION: No evidence of deep venous thrombosis in either lower extremity. Electronically Signed   By: Jerilynn Mages.  Shick M.D.   On: 01/14/2020 15:05   DG Chest Port 1 View  Result Date: 01/13/2020 CLINICAL DATA:  86 year old male with shortness of breath. Possible exposure to COVID-19. EXAM: PORTABLE CHEST 1 VIEW COMPARISON:  Chest radiographs 01/06/2020 and earlier. FINDINGS: Portable AP semi upright view at 0545 hours. Sequelae of TAVR. Stable cardiac  size and mediastinal contours. Heart size at the upper limits of normal. Visualized tracheal air column is within normal limits. Mildly lower lung volumes. Allowing for portable technique the lungs are clear. No pneumothorax or pleural effusion. Visible bowel gas increased in the upper abdomen but remains within normal limits. No acute osseous abnormality identified. IMPRESSION: No acute cardiopulmonary abnormality. Electronically Signed   By: Genevie Ann M.D.   On: 01/13/2020 06:00   ECHOCARDIOGRAM COMPLETE  Result Date: 01/14/2020    ECHOCARDIOGRAM REPORT   Patient Name:   AOI FREHNER Date of Exam: 01/14/2020 Medical Rec #:  [id number]         Height:       70.0 in Accession #:    [id number]        Weight:       188.0 lb Date of Birth:  February 02, 1931         BSA:          2.033 m Patient Age:    56 years          BP:           165/93 mmHg Patient Gender: M                 HR:           101 bpm. Exam Location:  [facility] Procedure: 2D Echo Indications:    Abnormal ECG R94.31  History:        Patient has prior history of Echocardiogram examinations, most                 recent 03/19/2019. CAD; Risk Factors:Dyslipidemia, Hypertension                 and Former Smoker. Parkinson's, TAVR March 2020, Elevated                 Troponin.  Sonographer:    Leavy Cella RDCS (AE) Referring Phys: [id number] COURAGE EMOKPAE IMPRESSIONS  1. Left ventricular ejection fraction, by estimation, is 70 to 75%. The left ventricle has hyperdynamic function. The left ventricle has no regional wall motion abnormalities. There is moderate left ventricular hypertrophy. Indeterminate diastolic filling due to E-A fusion.  2. Right ventricular systolic function is normal. The right ventricular size is normal.  3. Left atrial size was mildly dilated.  4. Trivial mitral valve regurgitation.  5. S/P TAVR with 29 mm Edwards Sapien prosthesis. Opens well Peak and mean gradients through the valve are 16 and  9 mm Hg respectively. Trivial valvular  AI. No signifiant change from echo in March 2021 .  6. The inferior vena cava is dilated in size with >50% respiratory variability, suggesting right atrial pressure of 8 mmHg. FINDINGS  Left Ventricle: Left ventricular ejection fraction, by estimation, is 70 to 75%. The left ventricle has hyperdynamic function. The left ventricle has no regional wall motion abnormalities. The left ventricular internal cavity size was normal in size. There is moderate left ventricular hypertrophy. Indeterminate diastolic filling due to E-A fusion. Right Ventricle: The right ventricular size is normal. Right vetricular wall thickness was not assessed. Right ventricular systolic function is normal. Left Atrium: Left atrial size was mildly dilated. Right Atrium: Right atrial size was normal in size. Pericardium: There is no evidence of pericardial effusion. Mitral Valve: The mitral valve is normal in structure. Trivial mitral valve regurgitation. Tricuspid Valve: The tricuspid valve is normal in structure. Tricuspid valve regurgitation is trivial. Aortic Valve: S/P TAVR with 29 mm Edwards Sapien prosthesis. Opens well Peak and mean gradients through the valve are 16 and 9 mm Hg respectively. Trivial valvular AI. No signifiant change from echo in March 2021. The aortic valve has been repaired/replaced. Aortic valve regurgitation is trivial. Aortic regurgitation PHT measures 387 msec. Aortic valve mean gradient measures 9.3 mmHg. Aortic valve peak gradient measures 15.8 mmHg. Pulmonic Valve: The pulmonic valve was grossly normal. Pulmonic valve regurgitation is not visualized. Aorta: The aortic root is normal in size and structure. Venous: The inferior vena cava is dilated in size with greater than 50% respiratory variability, suggesting right atrial pressure of 8 mmHg. IAS/Shunts: The interatrial septum was not assessed.  LEFT VENTRICLE PLAX 2D LVIDd:         4.62 cm Diastology LVIDs:         2.60 cm LV e' medial:    11.20 cm/s LV PW:          1.90 cm LV E/e' medial:  7.0 LV IVS:        1.66 cm LV e' lateral:   7.62 cm/s                        LV E/e' lateral: 10.3  RIGHT VENTRICLE RV S prime:     26.40 cm/s TAPSE (M-mode): 2.4 cm LEFT ATRIUM              Index       RIGHT ATRIUM           Index LA diam:        5.30 cm  2.61 cm/m  RA Area:     19.20 cm LA Vol (A2C):   121.0 ml 59.51 ml/m RA Volume:   59.70 ml  29.36 ml/m LA Vol (A4C):   67.9 ml  33.40 ml/m LA Biplane Vol: 92.3 ml  45.40 ml/m  AORTIC VALVE AV Vmax:           198.67 cm/s AV Vmean:          146.000 cm/s AV VTI:            0.361 m AV Peak Grad:      15.8 mmHg AV Mean Grad:      9.3 mmHg LVOT Vmax:         92.03 cm/s LVOT Vmean:        66.300 cm/s LVOT VTI:          0.177 m LVOT/AV VTI ratio: 0.49 AI PHT:  387 msec  AORTA Ao Root diam: 3.90 cm MITRAL VALVE MV Area (PHT): 2.92 cm     SHUNTS MV Decel Time: 260 msec     Systemic VTI: 0.18 m MV E velocity: 78.30 cm/s MV A velocity: 113.00 cm/s MV E/A ratio:  0.69 Dorris Carnes MD Electronically signed by Dorris Carnes MD Signature Date/Time: 01/14/2020/4:37:29 PM    Final         Scheduled Meds: . amLODipine  5 mg Oral Daily  . vitamin C  500 mg Oral Daily  . atorvastatin  40 mg Oral Daily  . brimonidine  1 drop Both Eyes TID  . carbidopa-levodopa  1 tablet Oral Q8H  . carbidopa-levodopa  2 tablet Oral Q8H  . Chlorhexidine Gluconate Cloth  6 each Topical Q0600  . dorzolamide-timolol  1 drop Both Eyes BID  . latanoprost  1 drop Both Eyes QHS  . metoprolol succinate  25 mg Oral Daily  . multivitamin with minerals  1 tablet Oral Daily  . pantoprazole  40 mg Oral Daily  . pramipexole  0.5 mg Oral TID  . sodium chloride flush  3 mL Intravenous Q12H  . sodium chloride flush  3 mL Intravenous Q12H   Continuous Infusions: . sodium chloride    . heparin 1,550 Units/hr (01/14/20 1354)  . lactated ringers 100 mL/hr at 01/14/20 1504     LOS: 0 days    Time spent: 35 minutes    Edwin Dada,  MD [facility] 01/14/2020, 5:35 PM     Please page though Rotan or Epic secure chat:  For [employer], Adult nurse

## 2020-01-14 NOTE — Evaluation (Signed)
Physical Therapy Evaluation Patient Details Name: [name] MRN: [id number] DOB: 14-May-1931 Today's Date: 01/14/2020   History of Present Illness  [name]  is a 86 y.o. male reformed smoker with past medical history relevant for Parkinson's disease, HTN, history of brain mass, CAD with prior stent to the LAD back in February 2020, diastolic dysfunction CHF,  Who presents to the ED with concerns for shortness of breath and diarrhea of about 3 days duration--no emesis, but patient does have nausea and poor appetite    Clinical Impression  Patient demonstrates slow labored movement for sitting up at bedside, frequently leans forward while seated at bedside, very unsteady on feet and at high risk for falls due to poor standing balance.  Patient able to take a few sides at bedside before having to sit due to fatigue.  Patient tolerated sitting up in chair after therapy with his son present in room - RN notified.  Patient will benefit from continued physical therapy in hospital and recommended venue below to increase strength, balance, endurance for safe ADLs and gait.      Follow Up Recommendations SNF;Supervision for mobility/OOB;Supervision/Assistance - 24 hour    Equipment Recommendations  None recommended by PT    Recommendations for Other Services       Precautions / Restrictions Precautions Precautions: Fall Restrictions Weight Bearing Restrictions: No      Mobility  Bed Mobility Overal bed mobility: Needs Assistance Bed Mobility: Supine to Sit     Supine to sit: Mod assist     General bed mobility comments: increased time, labored movement    Transfers Overall transfer level: Needs assistance Equipment used: Rolling walker (2 wheeled) Transfers: Sit to/from [employer] Sit to Stand: Mod assist Stand pivot transfers: Mod assist       General transfer comment: very unsteady on feet, slow labored  movement  Ambulation/Gait Ambulation/Gait assistance: Mod assist;Max assist Gait Distance (Feet): 5 Feet Assistive device: Rolling walker (2 wheeled) Gait Pattern/deviations: Decreased step length - right;Decreased step length - left;Decreased stride length;Shuffle Gait velocity: decreased   General Gait Details: limited to 5-6 slow unsteady labored side steps at bedside due to generalized weakness and fatigue  Stairs            Wheelchair Mobility    Modified Rankin (Stroke Patients Only)       Balance Overall balance assessment: Needs assistance Sitting-balance support: Feet supported;No upper extremity supported Sitting balance-Leahy Scale: Fair Sitting balance - Comments: seated at EOB, frequent leaning forward   Standing balance support: During functional activity;Bilateral upper extremity supported Standing balance-Leahy Scale: Poor Standing balance comment: fair/poor using RW                             Pertinent Vitals/Pain Pain Assessment: No/denies pain    Home Living Family/patient expects to be discharged to:: Private residence Living Arrangements: Children Available Help at Discharge: Available 24 hours/day Type of Home: House Home Access: Stairs to enter Entrance Stairs-Rails: None Entrance Stairs-Number of Steps: 1 Home Layout: Multi-level;Able to live on main level with bedroom/bathroom;Full bath on main level Home Equipment: Grab bars - tub/shower;Wheelchair - [employer] - 4 wheels;Shower seat;Bedside commode;Grab bars - toilet      Prior Function Level of Independence: Needs assistance   Gait / Transfers Assistance Needed: supervised household ambulator using Rollator most of time  ADL's / Homemaking Assistance Needed: has caregivers 4 hours/day x 7 days/week, family also assist  Hand Dominance   Dominant Hand: Right    Extremity/Trunk Assessment   Upper Extremity Assessment Upper Extremity Assessment:  Generalized weakness    Lower Extremity Assessment Lower Extremity Assessment: Generalized weakness    Cervical / Trunk Assessment Cervical / Trunk Assessment: Normal  Communication   Communication: No difficulties  Cognition Arousal/Alertness: Awake/alert Behavior During Therapy: WFL for tasks assessed/performed Overall Cognitive Status: Within Functional Limits for tasks assessed                                        General Comments      Exercises     Assessment/Plan    PT Assessment Patient needs continued PT services  PT Problem List Decreased strength;Decreased activity tolerance;Decreased balance;Decreased mobility       PT Treatment Interventions DME instruction;Gait training;Stair training;Functional mobility training;Therapeutic activities;Therapeutic exercise;Patient/family education;Balance training    PT Goals (Current goals can be found in the Care Plan section)  Acute Rehab PT Goals Patient Stated Goal: return home with paid attendants and family to assist PT Goal Formulation: With patient/family Time For Goal Achievement: 01/28/20 Potential to Achieve Goals: Good    Frequency Min 3X/week   Barriers to discharge        Co-evaluation               AM-PAC PT "6 Clicks" Mobility  Outcome Measure Help needed turning from your back to your side while in a flat bed without using bedrails?: A Lot Help needed moving from lying on your back to sitting on the side of a flat bed without using bedrails?: A Lot Help needed moving to and from a bed to a chair (including a wheelchair)?: A Lot Help needed standing up from a chair using your arms (e.g., wheelchair or bedside chair)?: A Lot Help needed to walk in hospital room?: A Lot Help needed climbing 3-5 steps with a railing? : Total 6 Click Score: 11    End of Session Equipment Utilized During Treatment: Oxygen Activity Tolerance: Patient tolerated treatment well;Patient limited by  fatigue Patient left: in chair;with call bell/phone within reach;with chair alarm set;with family/visitor present Nurse Communication: Mobility status PT Visit Diagnosis: Unsteadiness on feet (R26.81);Other abnormalities of gait and mobility (R26.89);Muscle weakness (generalized) (M62.81)    Time: [id number] PT Time Calculation (min) (ACUTE ONLY): 28 min   Charges:   PT Evaluation $PT Eval Moderate Complexity: 1 Mod PT Treatments $Therapeutic Activity: 23-37 mins        2:14 PM, 01/14/20 Lonell Grandchild, MPT Physical Therapist with [facility] [phone] office [phone] mobile phone

## 2020-01-14 NOTE — Progress Notes (Addendum)
ANTICOAGULATION CONSULT NOTE  Pharmacy Consult for heparin Indication: suspected PE   No Known Allergies  Patient Measurements: Height: 5\' 10"  (177.8 cm) Weight: 85.3 kg (188 lb) IBW/kg (Calculated) : 73 Heparin Dosing Weight: 85 kg  Vital Signs: Temp: 99.1 F (37.3 C) (12/29 0535) Temp Source: Oral (12/29 0014) BP: 165/93 (12/29 0535) Pulse Rate: 101 (12/29 0535)  Labs: Recent Labs    01/13/20 0510 01/13/20 0723 01/13/20 1423 01/14/20 0205 01/14/20 1012  HGB 15.4  --   --  13.4  --   HCT 44.4  --   --  39.1  --   PLT 134*  --   --  115*  --   HEPARINUNFRC  --   --   --  0.29* 0.27*  CREATININE 6.19*  --  3.30* 1.28*  --   TROPONINIHS 115* 123*  --   --   --     Estimated Creatinine Clearance: 42.8 mL/min (A) (by C-G formula based on SCr of 1.28 mg/dL (H)).   Assessment: 86 yo M with suspected PE given D-dimer 14, dyspnea. Pharmacy consulted for heparin without bolus. No anticoagulation PTA.   CBC WNL  heparin level 0.27 units/ml  Goal of Therapy:  Heparin level 0.3-0.7 units/ml Monitor platelets by anticoagulation protocol: Yes   Plan:  Increase heparin to 1550 units/hr F/u 6hr HL  Monitor daily HL, CBC/plt Monitor for signs/symptoms of bleeding  Likely transition to oral anticoagulation today  Thanks for allowing pharmacy to be a part of this patient's care.  86, PharmD Clinical Pharmacist 01/14/2020 11:11 AM

## 2020-01-14 NOTE — Progress Notes (Signed)
*  PRELIMINARY RESULTS* Echocardiogram 2D Echocardiogram has been performed.  Jeryl Columbia 01/14/2020, 9:59 AM

## 2020-01-14 NOTE — Progress Notes (Addendum)
ANTICOAGULATION CONSULT NOTE - Follow Up Consult  Pharmacy Consult for Heparin Indication: pulmonary embolus  No Known Allergies  Patient Measurements: Height: 5\' 10"  (177.8 cm) Weight: 85.3 kg (188 lb) IBW/kg (Calculated) : 73 Heparin Dosing Weight:  85.3 kg  Vital Signs: Temp: 98.6 F (37 C) (12/29 1503) BP: 133/76 (12/29 1503) Pulse Rate: 90 (12/29 1503)  Labs: Recent Labs    01/13/20 0510 01/13/20 0723 01/13/20 1423 01/14/20 0205 01/14/20 1012 01/14/20 1946  HGB 15.4  --   --  13.4  --   --   HCT 44.4  --   --  39.1  --   --   PLT 134*  --   --  115*  --   --   HEPARINUNFRC  --   --   --  0.29* 0.27* 0.38  CREATININE 6.19*  --  3.30* 1.28*  --   --   TROPONINIHS 115* 123*  --   --   --   --     Estimated Creatinine Clearance: 42.8 mL/min (A) (by C-G formula based on SCr of 1.28 mg/dL (H)).   Assessment:  Anticoag: PE. Negative for DVT.. HL 0.27>>0.38 now in goal range. CBC WNL, Hematuria likely from foley placement  Goal of Therapy:  Heparin level 0.3-0.7 units/ml Monitor platelets by anticoagulation protocol: Yes   Plan:  Con't heparin at 1550 units/hr Monitor daily HL, CBC/plt   Michai Dieppa S. 01/16/20, PharmD, BCPS Clinical Staff Pharmacist [email or web address] Merilynn Finland, Quintana Canelo Stillinger 01/14/2020,8:30 PM

## 2020-01-14 NOTE — Plan of Care (Signed)

## 2020-01-14 NOTE — NC FL2 (Signed)
[place] MEDICAID FL2 LEVEL OF CARE SCREENING TOOL     IDENTIFICATION  Patient Name: [name] Birthdate: 19-Mar-1931 Sex: male Admission Date (Current Location): 01/13/2020  [facility] and [place] Number:  [employer] and Address:  [facility] [address] [place]      Provider Number: [id number]  Attending Physician Name and Address:  Edwin Dada, *  Relative Name and Phone Number:  Tyberious Tweten - son [phone]    Current Level of Care: Hospital Recommended Level of Care: [place] Prior Approval Number:    Date Approved/Denied:   PASRR Number:    Discharge Plan: SNF    Current Diagnoses: Patient Active Problem List   Diagnosis Date Noted   Acute pulmonary embolism ([place]) 01/14/2020   AKI (acute kidney injury) ([place]) 01/13/2020   Elevated troponin 01/13/2020   Elevated d-dimer 01/13/2020   Glaucoma 01/13/2020   Swallowing impairment 01/13/2020   Aortic stenosis-hx of aortic stenosis status post TAVR in March 2020 01/13/2020   Brain Mass---right lateral frontotemporal extra-axial mass 7.1 cm with typical features of meningioma 02/28/18 (to follow-up w/ neurosurgeon Dr. Vertell Limber) 01/13/2020   Acute on chronic diastolic heart failure ([place]) 03/20/2018   S/P TAVR (transcatheter aortic valve replacement) 03/19/2018   Coronary artery disease- s/p DES to LAD (02/19/18)    Pancreatic duct dilated    Severe aortic stenosis 02/19/2018   OSA (obstructive sleep apnea)    History of colonic polyps    Hx of adenomatous colonic polyps 01/04/2015   Lumbar canal stenosis 12/04/2013   Primary central sleep apnea 04/16/2012   Orthostatic hypotension 04/16/2012   Hereditary and idiopathic peripheral neuropathy 04/16/2012   Parkinson's 04/16/2012   COLONIC POLYPS, ADENOMATOUS 11/19/2008   GERD 11/19/2008   GI BLEEDING 11/19/2008   HLD (hyperlipidemia) 07/03/2008   Essential hypertension  07/03/2008    Orientation RESPIRATION BLADDER Height & Weight     Self,Time,Situation,Place  Normal External catheter Weight: 85.3 kg Height:  5\' 10"  (177.8 cm)  BEHAVIORAL SYMPTOMS/MOOD NEUROLOGICAL BOWEL NUTRITION STATUS      Continent Diet (See DC summary)  AMBULATORY STATUS COMMUNICATION OF NEEDS Skin   Extensive Assist Verbally Normal                       Personal Care Assistance Level of Assistance  Bathing,Feeding,Dressing Bathing Assistance: Maximum assistance Feeding assistance: Independent Dressing Assistance: Limited assistance     Functional Limitations Info  Sight,Hearing,Speech Sight Info: Adequate Hearing Info: Adequate Speech Info: Adequate    SPECIAL CARE FACTORS FREQUENCY                       Contractures Contractures Info: Not present    Additional Factors Info  Code Status,Allergies Code Status Info: Full Allergies Info: NKDA           Current Medications (01/14/2020):  This is the current hospital active medication list Current Facility-Administered Medications  Medication Dose Route Frequency Provider Last Rate Last Admin   0.9 %  sodium chloride infusion  250 mL Intravenous PRN Emokpae, Courage, MD       acetaminophen (TYLENOL) tablet 650 mg  650 mg Oral Q6H PRN Emokpae, Courage, MD       Or   acetaminophen (TYLENOL) suppository 650 mg  650 mg Rectal Q6H PRN Emokpae, Courage, MD       amLODipine (NORVASC) tablet 5 mg  5 mg Oral Daily Roxan Hockey, MD  5 mg at 01/14/20 1158   ascorbic acid (VITAMIN C) tablet 500 mg  500 mg Oral Daily Emokpae, Courage, MD   500 mg at 01/14/20 1158   aspirin chewable tablet 81 mg  81 mg Oral Daily Emokpae, Courage, MD   81 mg at 01/14/20 1158   atorvastatin (LIPITOR) tablet 40 mg  40 mg Oral Daily Emokpae, Courage, MD   40 mg at 01/14/20 1158   bisacodyl (DULCOLAX) suppository 10 mg  10 mg Rectal Daily PRN Emokpae, Courage, MD       brimonidine (ALPHAGAN) 0.2 % ophthalmic solution 1  drop  1 drop Both Eyes TID Denton Brick, Courage, MD   1 drop at 01/14/20 1631   carbidopa-levodopa (SINEMET IR) 25-100 MG per tablet immediate release 1 tablet  1 tablet Oral Q8H Danford, Suann Larry, MD   1 tablet at 01/14/20 1505   carbidopa-levodopa (SINEMET IR) 25-100 MG per tablet immediate release 2 tablet  2 tablet Oral Q8H Danford, Suann Larry, MD       Chlorhexidine Gluconate Cloth 2 % PADS 6 each  6 each Topical Q0600 Emokpae, Courage, MD       dorzolamide-timolol (COSOPT) 22.3-6.8 MG/ML ophthalmic solution 1 drop  1 drop Both Eyes BID Emokpae, Courage, MD   1 drop at 01/14/20 0915   heparin ADULT infusion 100 units/mL (25000 units/261mL)  1,550 Units/hr Intravenous Continuous Edwin Dada, MD 15.5 mL/hr at 01/14/20 1354 1,550 Units/hr at 01/14/20 1354   lactated ringers infusion   Intravenous Continuous Emokpae, Courage, MD 100 mL/hr at 01/14/20 1504 New Bag at 01/14/20 1504   latanoprost (XALATAN) 0.005 % ophthalmic solution 1 drop  1 drop Both Eyes QHS Emokpae, Courage, MD   1 drop at 01/13/20 2204   metoprolol succinate (TOPROL-XL) 24 hr tablet 25 mg  25 mg Oral Daily Emokpae, Courage, MD   25 mg at 01/14/20 1158   multivitamin with minerals tablet 1 tablet  1 tablet Oral Daily Emokpae, Courage, MD   1 tablet at 01/14/20 1158   nitroGLYCERIN (NITROSTAT) SL tablet 0.4 mg  0.4 mg Sublingual Q5 min PRN Emokpae, Courage, MD       ondansetron (ZOFRAN) tablet 4 mg  4 mg Oral Q6H PRN Emokpae, Courage, MD       Or   ondansetron (ZOFRAN) injection 4 mg  4 mg Intravenous Q6H PRN Emokpae, Courage, MD       pantoprazole (PROTONIX) EC tablet 40 mg  40 mg Oral Daily Reubin Milan, MD   40 mg at 01/14/20 1158   polyethylene glycol (MIRALAX / GLYCOLAX) packet 17 g  17 g Oral Daily PRN Emokpae, Courage, MD       pramipexole (MIRAPEX) tablet 0.5 mg  0.5 mg Oral TID Emokpae, Courage, MD   0.5 mg at 01/14/20 1158   sodium chloride flush (NS) 0.9 % injection 3 mL  3 mL  Intravenous Q12H Emokpae, Courage, MD   3 mL at 01/13/20 1035   sodium chloride flush (NS) 0.9 % injection 3 mL  3 mL Intravenous Q12H Emokpae, Courage, MD   3 mL at 01/14/20 0858   sodium chloride flush (NS) 0.9 % injection 3 mL  3 mL Intravenous PRN Emokpae, Courage, MD       traZODone (DESYREL) tablet 50 mg  50 mg Oral QHS PRN Roxan Hockey, MD         Discharge Medications: Please see discharge summary for a list of discharge medications.  Relevant Imaging Results:  Relevant Lab  Results:   Additional Information SS# [id number]  Leitha Bleak, RN

## 2020-01-14 NOTE — TOC Initial Note (Signed)
Transition of Care [facility]) - Initial/Assessment Note    Patient Details  Name: [name] MRN: [id number] Date of Birth: [birth year]  [facility]) CM/SW Contact:    Boneta Lucks, RN Phone Number: 01/14/2020, 4:44 PM  Clinical Narrative:   Patient admitted with Acute Kidney injury, TOC spoke with son [name]. Patient daughter lives with him,  But is not there during the day.    PT is recommending SNF/ 24hr supervision.  Per [name] patient has not has his parkinson medication and right now he would say SNF, he now also has a foley.  He is okay with referral to [facility], with hopes of him feeling better and going home with home health. TOC sent to [facility], will follow.        Expected Discharge Plan: [place] Barriers to Discharge: No Barriers Identified,Continued Medical Work up   Patient Goals and CMS Choice Patient states their goals for this hospitalization and ongoing recovery are:: To go home, but will go to SNF if not better at discharge CMS Medicare.gov Compare Post Acute Care list provided to:: Patient Represenative (must comment) Choice offered to / list presented to : Adult Children (son)  Expected Discharge Plan and Services Expected Discharge Plan: [place]       Prior Living Arrangements/Services   Lives with:: Adult Children          Need for Family Participation in Patient Care: Yes (Comment) Care giver support system in place?: Yes (comment)   Criminal Activity/Legal Involvement Pertinent to Current Situation/Hospitalization: No - Comment as needed  Activities of Daily Living Home Assistive Devices/Equipment: Eyeglasses,Walker (specify type),Wheelchair,Bedside commode/3-in-1 ADL Screening (condition at time of admission) Patient's cognitive ability adequate to safely complete daily activities?: Yes Is the patient deaf or have difficulty hearing?: No Does the patient have difficulty seeing, even when  wearing glasses/contacts?: No Does the patient have difficulty concentrating, remembering, or making decisions?: No Patient able to express need for assistance with ADLs?: Yes Does the patient have difficulty dressing or bathing?: No Independently performs ADLs?: No Communication: Independent Dressing (OT): Dependent Is this a change from baseline?: Pre-admission baseline Grooming: Dependent Is this a change from baseline?: Pre-admission baseline Feeding: Dependent Is this a change from baseline?: Pre-admission baseline Bathing: Dependent Is this a change from baseline?: Pre-admission baseline Toileting: Dependent Is this a change from baseline?: Pre-admission baseline In/Out Bed: Dependent Is this a change from baseline?: Pre-admission baseline Does the patient have difficulty walking or climbing stairs?: Yes Weakness of Legs: Both Weakness of Arms/Hands: Both  Permission Sought/Granted        Orientation: : Oriented to Self,Oriented to Place,Oriented to  Time,Oriented to Situation Alcohol / Substance Use: Not Applicable Psych Involvement: No (comment)  Admission diagnosis:  SOB (shortness of breath) [R06.02] Hypoxia [R09.02] AKI (acute kidney injury) ([place]) [N17.9] Acute pulmonary embolism ([facility]) [I26.99] Patient Active Problem List   Diagnosis Date Noted   Acute pulmonary embolism ([place]) 01/14/2020   AKI (acute kidney injury) ([place]) 01/13/2020   Elevated troponin 01/13/2020   Elevated d-dimer 01/13/2020   Glaucoma 01/13/2020   Swallowing impairment 01/13/2020   Aortic stenosis-hx of aortic stenosis status post TAVR in March 2020 01/13/2020   Brain Mass---right lateral frontotemporal extra-axial mass 7.1 cm with typical features of meningioma 02/28/18 (to follow-up w/ neurosurgeon Dr. Vertell Limber) 01/13/2020   Acute on chronic diastolic heart failure ([place]) 03/20/2018   S/P TAVR (transcatheter aortic valve replacement) 03/19/2018   Coronary artery disease- s/p  DES to  LAD (02/19/18)    Pancreatic duct dilated    Severe aortic stenosis 02/19/2018   OSA (obstructive sleep apnea)    History of colonic polyps    Hx of adenomatous colonic polyps 01/04/2015   Lumbar canal stenosis 12/04/2013   Primary central sleep apnea 04/16/2012   Orthostatic hypotension 04/16/2012   Hereditary and idiopathic peripheral neuropathy 04/16/2012   Parkinson's 04/16/2012   COLONIC POLYPS, ADENOMATOUS 11/19/2008   GERD 11/19/2008   GI BLEEDING 11/19/2008   HLD (hyperlipidemia) 07/03/2008   Essential hypertension 07/03/2008   PCP:  Benita Stabile, MD Pharmacy:   [facility] [address], [place] - [address] [place] [address] Phone: [phone] Fax: [phone]  [facility] [place], [place] - [address] [place], [address] [place], [address] [place] [address] Phone: [phone] Fax: [phone]   Readmission Risk Interventions Readmission Risk Prevention Plan 01/14/2020  Transportation Screening Complete  Home Care Screening Complete  Medication Review (RN CM) Complete  Some recent data might be hidden

## 2020-01-14 NOTE — Evaluation (Signed)
Clinical/Bedside Swallow Evaluation Patient Details  Name: Nicholas HarrierLawrence D Yohe MRN: [id number] Date of Birth: [birth year]  Today's Date: 01/14/2020 Time: SLP Start Time (ACUTE ONLY): 1251 SLP Stop Time (ACUTE ONLY): 1343 SLP Time Calculation (min) (ACUTE ONLY): 52 min  Past Medical History:  Past Medical History:  Diagnosis Date   Arthritis    Brain mass    right lateral frontotemporal extra-axial mass 7.1 cm with typical features of meningioma 02/28/18 (to follow-up w/ neurosurgeon Dr. Venetia MaxonStern)   Cancer [facility](HCC)    h/o  skin  cancer   Coronary artery disease    s/p DES to LAD (02/19/18)   Ex-smoker    GERD (gastroesophageal reflux disease)    Glaucoma 01/13/2020   Hypercholesteremia    Hypertension    Lumbar stenosis    OSA (obstructive sleep apnea)    Pancreatic duct dilated    Parkinson's disease (HCC)    S/P TAVR (transcatheter aortic valve replacement)    Severe aortic stenosis    Past Surgical History:  Past Surgical History:  Procedure Laterality Date   APPENDECTOMY     BACK SURGERY  11/2013   COLONOSCOPY  11/17/2008   Rourk: Sessile polyp in the cecum status post saline-assisted piecemeal polypectomy, resolution clipping and epinephrine injection therapy performed. Shallow sigmoid diverticula.. Tubular adenoma   COLONOSCOPY  11/19/2008   Fields: Large amount of liquid blood with clots seen throughout the colon. Previous [place] resolution clip remained in place. Purple spot seen in the lateral aspect polypectomy site, 3 resolution clips placed here. 6 mm sessile ascending colon polyp seen and not manipulated.   COLONOSCOPY  11/25/2008   Rourk: Blood-tinged colonic effluent, suspect recurrent post polypectomy bleeding status post placement of 3 clips on the polypectomy site on top of the 3 clips that already existed, one had fallen off since last procedure. One small polyp distal to the cecum not manipulated   COLONOSCOPY  12/06/2009   Rourk: Internal  hemorrhoids, scattered left-sided diverticula, cecal polyps, one snared and subsequently clipped, 2 adjacent diminutive polyps ablated. tubular adenoma   COLONOSCOPY N/A 01/21/2015   Dr. Jena Gaussourk: 1.5 cm carpet-type polyp just distal to the ileocecal valve removed piecemeal fashion and ablated. Small polyp in the rectum. Pathology-tubular adenomas   COLONOSCOPY N/A 09/01/2015   Procedure: COLONOSCOPY;  Surgeon: Corbin Adeobert M Rourk, MD;  [facility];  Service: Endoscopy;  Laterality: N/A;  845   CORONARY STENT INTERVENTION N/A 02/19/2018   Procedure: CORONARY STENT INTERVENTION;  Surgeon: Tonny Bollmanooper, Michael, MD;  [facility];  Service: Cardiovascular;  Laterality: N/A;   EYE SURGERY     bilateral  cataracts   INTRAVASCULAR PRESSURE WIRE/FFR STUDY N/A 02/19/2018   Procedure: INTRAVASCULAR PRESSURE WIRE/FFR STUDY;  Surgeon: Tonny Bollmanooper, Michael, MD;  [facility];  Service: Cardiovascular;  Laterality: N/A;   MENISCUS REPAIR     right knee  2000   POLYPECTOMY  09/01/2015   Procedure: POLYPECTOMY;  Surgeon: Corbin Adeobert M Rourk, MD;  Location: [facility];  Service: Endoscopy;;  colon   RIGHT/LEFT HEART CATH AND CORONARY ANGIOGRAPHY N/A 02/19/2018   Procedure: RIGHT/LEFT HEART CATH AND CORONARY ANGIOGRAPHY;  Surgeon: Tonny Bollmanooper, Michael, MD;  [facility];  Service: Cardiovascular;  Laterality: N/A;   TEE WITHOUT CARDIOVERSION N/A 03/19/2018   Procedure: TRANSESOPHAGEAL ECHOCARDIOGRAM (TEE);  Surgeon: Tonny Bollmanooper, Michael, MD;  [facility];  Service: Open Heart Surgery;  Laterality: N/A;   TONSILLECTOMY Bilateral    TRANSCATHETER AORTIC VALVE REPLACEMENT,  TRANSFEMORAL N/A 03/19/2018   Procedure: TRANSCATHETER AORTIC VALVE REPLACEMENT, TRANSFEMORAL;  Surgeon: Tonny Bollman, MD;  Location: [facility] INVASIVE CV LAB;  Service: Open Heart Surgery;  Laterality: N/A;   HPI:  [name] is a 86 y.o. male. Patient was initially coming in secondary to  decreased urine output that also apparently was hypoxic in the triage.  He stated he had been short of breath. Pt with hx including Parkinson's disease (received some OP cognitive-lingustic ST targeting maintaining loudness and abdominal breathing coaching/treatment in 2018). Further pertinant medical hx including: AKI (acute kidney injury) (HCC) - principal problem, Brain Mass---right lateral frontotemporal extra-axial mass 7.1 cm with typical features of meningioma 02/28/18 (to follow-up w/ neurosurgeon Dr. Venetia Maxon), reported swallowing impairment, obstructive sleep apnea. COVID - 19 test is negative, however reportedly people in the house where he lives are covid positive. Pt is reportedly having difficulty with swallowing and a bedside swallowing evaluation has been ordered.   Assessment / Plan / Recommendation Clinical Impression  Clinical swallowing evaluation completed while Pt was sitting upright in chair; Pt presented with overt s/sx of oropharyngeal dysphagia. Pt's son was present for evaluation reporting his father has had increased "choking episodes" at home in recent months but when he "takes his time" and takes small sips that he does "ok". Pt is reportedly on thin liquids and eats "whatever he wants" at home. Pt was lethargic but after repositioning and oral care Pt was alert and appropriate for PO. Pt consumed ice chips and thin liquids with poor oral control, anterior spillage, immediate coughing, suspected delayed swallow trigger and multiple swallows. Further note wet sounding coughing and lingering wet vocal quality after thin trials. Continued overt s/sx noted with NTL trials including poor oral control, audible swallow followed by delayed coughing and wet vocal quality. With HTL Pt continued to demonstrate poor oral control, however no overt coughing or wet vocal quality was noted. Pt consumed puree with prolonged oral stage and multiple swallows.   Recommend initiate D2/fine chopped diet  with HTL and meds to be crushed in puree. Pt's son reports that afternoons are "naptime" for the Pt, it is notable that lethargy and altered mentation compounded by Pt not receiving his Parkinson's medications while he was NPO (until earlier today) could have significant impact on Pts swallowing during this evaluation. ST will continue to follow for diagnostic dysphagia therapy including subjective upgrades and/or MBSS. Thank you,  SLP Visit Diagnosis: Dysphagia, oropharyngeal phase (R13.12);Dysphagia, unspecified (R13.10)    Aspiration Risk  Moderate aspiration risk;Severe aspiration risk    Diet Recommendation Dysphagia 2 (Fine chop);Honey-thick liquid   Liquid Administration via: Cup;Spoon Medication Administration: Crushed with puree Compensations: Slow rate;Small sips/bites;Follow solids with liquid;Clear throat intermittently;Multiple dry swallows after each bite/sip Postural Changes: Seated upright at 90 degrees;Remain upright for at least 30 minutes after po intake    Other  Recommendations Oral Care Recommendations: Oral care BID Other Recommendations: Order thickener from pharmacy   Follow up Recommendations Skilled Nursing facility;24 hour supervision/assistance      Frequency and Duration min 2x/week  1 week       Prognosis Prognosis for Safe Diet Advancement: Fair Barriers to Reach Goals: Severity of deficits      Swallow Study   General Date of Onset: 01/13/20 HPI: [name] is a 86 y.o. male. Patient was initially coming in secondary to decreased urine output that also apparently was hypoxic in the triage.  He stated he had been short of breath. Pt with hx  including Parkinson's disease (received some OP cognitive-lingustic ST targeting maintaining loudness and abdominal breathing coaching/treatment in 2018). Further pertinant medical hx including: AKI (acute kidney injury) ([place]) - principal problem, Brain Mass---right lateral frontotemporal extra-axial mass 7.1  cm with typical features of meningioma 02/28/18 (to follow-up w/ neurosurgeon Dr. Vertell Limber), reported swallowing impairment, obstructive sleep apnea. COVID - 19 test is negative, however reportedly people in the house where he lives are covid positive. Pt is reportedly having difficulty with swallowing and a bedside swallowing evaluation has been ordered. Type of Study: Bedside Swallow Evaluation Previous Swallow Assessment: none in chart Diet Prior to this Study: NPO Temperature Spikes Noted: No Respiratory Status: Nasal cannula History of Recent Intubation: No Behavior/Cognition: Lethargic/Drowsy;Cooperative;Pleasant mood;Confused;Requires cueing Oral Cavity Assessment: Within Functional Limits Oral Care Completed by SLP: Yes Oral Cavity - Dentition: Adequate natural dentition Vision: Functional for self-feeding Self-Feeding Abilities: Total assist;Needs set up;Needs assist Patient Positioning: Upright in chair Baseline Vocal Quality: Low vocal intensity;Breathy Volitional Cough: Weak Volitional Swallow: Unable to elicit    Oral/Motor/Sensory Function Overall Oral Motor/Sensory Function: Generalized oral weakness   Ice Chips Ice chips: Impaired Presentation: Spoon Oral Phase Impairments: Reduced labial seal;Poor awareness of bolus;Impaired mastication;Reduced lingual movement/coordination Oral Phase Functional Implications: Prolonged oral transit;Oral residue;Right anterior spillage;Left anterior spillage Pharyngeal Phase Impairments: Suspected delayed Swallow;Multiple swallows;Wet Vocal Quality;Throat Clearing - Immediate   Thin Liquid Thin Liquid: Impaired Presentation: Spoon;Cup Oral Phase Impairments: Reduced labial seal;Reduced lingual movement/coordination;Impaired mastication;Poor awareness of bolus Oral Phase Functional Implications: Right anterior spillage;Left anterior spillage;Oral residue;Prolonged oral transit Pharyngeal  Phase Impairments: Suspected delayed Swallow;Multiple  swallows;Wet Vocal Quality;Throat Clearing - Delayed;Cough - Immediate;Cough - Delayed    Nectar Thick Nectar Thick Liquid: Impaired Presentation: Cup Oral Phase Impairments: Poor awareness of bolus;Reduced lingual movement/coordination;Reduced labial seal Oral phase functional implications: Right anterior spillage;Left anterior spillage;Oral residue Pharyngeal Phase Impairments: Suspected delayed Swallow;Multiple swallows;Wet Vocal Quality;Cough - Delayed   Honey Thick Honey Thick Liquid: Impaired Presentation: Cup Oral Phase Impairments: Reduced labial seal;Impaired mastication;Poor awareness of bolus   Puree Puree: Impaired Presentation: Self Fed Oral Phase Impairments: Reduced labial seal;Impaired mastication;Poor awareness of bolus Pharyngeal Phase Impairments: Suspected delayed Swallow;Multiple swallows   Solid      Solid: Not tested     Kamya Watling H. Roddie Mc, [place] Speech Language Pathologist  Wende Bushy 01/14/2020,2:09 PM

## 2020-01-14 NOTE — Consult Note (Signed)
I have spoken with Dr. Maryfrances Bunnell regarding the patient's hematuria.  Initiating event is more than likely catheter placement for large urinary residual.  Typically people have gross hematuria for short period of time after bladder decompression due to urothelial irritation.  Unfortunately, this patient is on heparin and aspirin, so I expect that it would take longer for the urine to clear.  I would leave catheter in, and would recommend bladder irrigation on a regular basis.  If further urologic consultation necessary, please call us.

## 2020-01-15 ENCOUNTER — Inpatient Hospital Stay (HOSPITAL_COMMUNITY)
Admit: 2020-01-15 | Discharge: 2020-01-15 | Disposition: A | Discharge status: Home / Self Care | Payer: Medicare Other | Attending: Family Medicine | Admitting: Family Medicine

## 2020-01-15 ENCOUNTER — Inpatient Hospital Stay (HOSPITAL_COMMUNITY): Payer: Medicare Other

## 2020-01-15 DIAGNOSIS — R4182 Altered mental status, unspecified: Secondary | ICD-10-CM | POA: Diagnosis not present

## 2020-01-15 DIAGNOSIS — N179 Acute kidney failure, unspecified: Secondary | ICD-10-CM | POA: Diagnosis not present

## 2020-01-15 LAB — CBC
HCT: 34.3 % — ABNORMAL LOW (ref 39.0–52.0)
Hemoglobin: 11.4 g/dL — ABNORMAL LOW (ref 13.0–17.0)
MCH: 34.3 pg — ABNORMAL HIGH (ref 26.0–34.0)
MCHC: 33.2 g/dL (ref 30.0–36.0)
MCV: 103.3 fL — ABNORMAL HIGH (ref 80.0–100.0)
Platelets: 115 10*3/uL — ABNORMAL LOW (ref 150–400)
RBC: 3.32 MIL/uL — ABNORMAL LOW (ref 4.22–5.81)
RDW: 13.4 % (ref 11.5–15.5)
WBC: 9.5 10*3/uL (ref 4.0–10.5)
nRBC: 0 % (ref 0.0–0.2)

## 2020-01-15 LAB — HEPARIN LEVEL (UNFRACTIONATED)
Heparin Unfractionated: 0.28 IU/mL — ABNORMAL LOW (ref 0.30–0.70)
Heparin Unfractionated: 0.39 IU/mL (ref 0.30–0.70)

## 2020-01-15 LAB — BASIC METABOLIC PANEL
Anion gap: 10 (ref 5–15)
BUN: 23 mg/dL (ref 8–23)
CO2: 25 mmol/L (ref 22–32)
Calcium: 8.5 mg/dL — ABNORMAL LOW (ref 8.9–10.3)
Chloride: 108 mmol/L (ref 98–111)
Creatinine, Ser: 0.92 mg/dL (ref 0.61–1.24)
GFR, Estimated: >60 mL/min (ref >60)
Glucose, Bld: 126 mg/dL — ABNORMAL HIGH (ref 70–99)
Potassium: 3.6 mmol/L (ref 3.5–5.1)
Sodium: 143 mmol/L (ref 135–145)

## 2020-01-15 LAB — PROCALCITONIN: Procalcitonin: 0.13 ng/mL

## 2020-01-15 MED ORDER — SODIUM CHLORIDE 0.9 % IV SOLN: INTRAVENOUS | Status: DC | PRN

## 2020-01-15 MED ORDER — SODIUM CHLORIDE 0.9 % IV SOLN: INTRAVENOUS | Status: AC

## 2020-01-15 MED ORDER — SODIUM CHLORIDE 0.9 % IV SOLN
1.0000 g | INTRAVENOUS | Status: DC
  Administered 2020-01-15 – 2020-01-18 (×5): 1 g via INTRAVENOUS
  Filled 2020-01-15 (×4): qty 10

## 2020-01-15 NOTE — Progress Notes (Signed)
Patient has increase bleeding, bruising and a small hematoma near skin tear to right elbow. MD made aware via amion chat.

## 2020-01-15 NOTE — Procedures (Addendum)
Patient Name: [name]  MRN: [id number]  Epilepsy Attending: Charlsie Quest  Referring Physician/Provider: Dr Joen Laura Date: 01/15/2020 Duration: 24.26 mins  Patient history: 86yo M with ams. EEG to evaluate for seizure  Level of alertness: Awake,  asleep  AEDs during EEG study: None  Technical aspects: This EEG study was done with scalp electrodes positioned according to the 10-20 International system of electrode placement. Electrical activity was acquired at a sampling rate of 500Hz  and reviewed with a high frequency filter of 70Hz  and a low frequency filter of 1Hz . EEG data were recorded continuously and digitally stored.   Description: No posterior dominant rhythm was seen. Sleep was characterized by vertex waves, sleep spindles (12 to 14 Hz), maximal frontocentral region.  EEG showed continuous generalized predominantly 5 to 6 Hz theta as well as intermittent 2-3Hz  delta slowing, at times with triphasic morphology. At 1640, patient was noted to have right hand movement as if grabbing blanket and moving in circles. Concomitant eeg before, during and after the event didn't show eeg change to suggest seizure. Hyperventilation and photic stimulation were not performed.   ABNORMALITY -Continuous slow, generalized  IMPRESSION: This study is suggestive of moderate diffuse encephalopathy, nonspecific etiology but could be secondary to toxic-metabolic etiology. No seizures or epileptiform discharges were seen throughout the recording.  At 1640, patient was noted to have right hand movement as if grabbing blanket and moving in circles without concomitant eeg change and was NOT an epileptic event. Differentials include most likely tremor due to parkinson's disease vs sterotypies. Clinical correlation is recommended.   Jamy Cleckler 

## 2020-01-15 NOTE — Progress Notes (Signed)
ANTICOAGULATION CONSULT NOTE  Pharmacy Consult for heparin Indication: suspected PE   No Known Allergies  Patient Measurements: Height: 5\' 10"  (177.8 cm) Weight: 85.3 kg (188 lb) IBW/kg (Calculated) : 73 Heparin Dosing Weight: 85 kg  Vital Signs: Temp: 100.6 F (38.1 C) (12/30 0520) Temp Source: Oral (12/30 0520) BP: 135/89 (12/30 0520) Pulse Rate: 94 (12/30 0520)  Labs: Recent Labs    01/13/20 0510 01/13/20 0510 01/13/20 0723 01/13/20 1423 01/14/20 0205 01/14/20 1012 01/14/20 1946 01/15/20 0612  HGB 15.4  --   --   --  13.4  --   --  11.4*  HCT 44.4  --   --   --  39.1  --   --  34.3*  PLT 134*  --   --   --  115*  --   --  115*  HEPARINUNFRC  --    < >  --   --  0.29* 0.27* 0.38 0.28*  CREATININE 6.19*  --   --  3.30* 1.28*  --   --  0.92  TROPONINIHS 115*  --  123*  --   --   --   --   --    < > = values in this interval not displayed.    Estimated Creatinine Clearance: 59.5 mL/min (by C-G formula based on SCr of 0.92 mg/dL).   Assessment: 86 yo M with suspected PE given D-dimer 14, dyspnea. Pharmacy consulted for heparin without bolus. No anticoagulation PTA.   CBC WNL  heparin level 0.28 units/ml  Goal of Therapy:  Heparin level 0.3-0.7 units/ml Monitor platelets by anticoagulation protocol: Yes   Plan:  Increase heparin to 1700 units/hr F/u 8hr HL  Monitor daily HL, CBC/plt Monitor for signs/symptoms of bleeding  Likely transition to oral anticoagulation today  Thanks for allowing pharmacy to be a part of this patient's care.  89, PharmD Clinical Pharmacist 01/15/2020 8:22 AM

## 2020-01-15 NOTE — Progress Notes (Signed)
[facility] PROGRESS NOTE    [name]  [id number] DOB: 09/26/1931 DOA: 01/13/2020 PCP: Celene Squibb, MD      Brief Narrative:  [name] is a 86 y.o. M with Parkinson's disease, CAD s/p PCI last 02/2018, TAVR in 03/2018, OSA, meningioma and HTN who presented with diarrhea for 2 days and malaise/confusion.  Patient is normally ambulatory, without evidence of dementia.  3 days prior to admission, he developed frequent loose and watery diarrhea.  Over the next 24 to 48 hours, he became weaker and weaker until he could get up from a chair, and then seemed confused, so family brought him to the ER.  In the ER, he was weak and confused, also noted to be hypoxic and dyspneic.  D-dimer very elevated.  Cr noted to be >6 and bladder scan showed retention.  Foley placed and he was started on empiric heparin for PE, and admitted for IV fluids.       Assessment & Plan:  Acute pulmonary embolism Admitted and started on heparin empirically.  VQ scan showed bilateral PE.  Echo shows normal right heart function, Dopplers without DVT, Pasi score still very high given overall clinical picture.  -Continue heparin    Fever Fever overnight.  In the setting of stability, coarse respiratory sounds, and leukocytosis at admission, I will start antibiotics.  Sepsis is doubted. -Start ceftriaxone -Obtain chest x-ray -Follow procalcitonin -Start empiric ceftriaxone  Acute renal failure due to obstruction Resolved -Hold losartan  Acute metabolic encephalopathy At baseline has only mild cognitive impairment.  CT head unremarkable.  Suspect this is mostly due to critical illness, Sinemet deficiency.  Stroke ruled out.  Seizures unliekly. -Check EEG -Treat PE -Continue Sinemet and Mirapex  Hematuria Discussed with Urology, likely from urothelial injury with obstruction, likely to clear.  Hgb slightly down, likely dilution.  Hematoma on arm is small. -Manual flushes  as needed -Stop aspirin (last DES >12 months) -Monitor Hgb level  Parkinson's disease -Continue Sinemet and Mirapex  Coronary disease secondary prevention Severe aortic stenosis status post TAVR -Continue amlodipine, atorvastatin, metoprolol -Hold aspirin losartan   Sleep apnea -Continue CPAP  Meningioma Stable on MRI  GERD -Continue PPI          Disposition: Status is: Inpatient  Remains inpatient appropriate because:IV treatments appropriate due to intensity of illness or inability to take PO   Dispo: The patient is from: Home              Anticipated d/c is to: SNF              Anticipated d/c date is: 3 days              Patient currently is not medically stable to d/c.     The patient was admitted with bilateral PE, renal failure due to obstruction, and severe acute metabolic encephalopathy.  His metabolic encephalopathy is still dense and severe, and he still requires supplemental oxygen and is unable to out of bed.  Continue heparin, aggressive physical therapy, monitor for bleeding.  Likely several more days of admission.         MDM: The below labs and imaging reports were reviewed and summarized above.  Medication management as above.  Severe illness with threat to life, blood thinner managed   DVT prophylaxis: SCDs Start: 01/13/20 0837 Place TED hose Start: 01/13/20 0837  Code Status: FULL Family Communication: Daughter in the room    Consultants:   Urology  Subjective: Still somnolent and confused with bradykinesia, but oriented to self, situation.  Hematoma in the right arm, hematuria persists.  Fever overnight.  He has a coarse wet cough.  No bowel movements at all in the last 24 hours.  No chest pain      .  Objective: Vitals:   01/14/20 2045 01/14/20 2101 01/15/20 0520 01/15/20 0940  BP:  132/65 135/89 (!) 158/81  Pulse:  93 94 (!) 104  Resp:  20 18   Temp:  99.1 F (37.3 C) (!) 100.6 F (38.1 C)    TempSrc:   Oral   SpO2: 92% 90% 90%   Weight:      Height:        Intake/Output Summary (Last 24 hours) at 01/15/2020 [id number] Last data filed at 01/14/2020 2109 Gross per 24 hour  Intake 220 ml  Output 700 ml  Net -480 ml   Filed Weights   01/13/20 0358  Weight: 85.3 kg    Examination: General appearance: Elderly male, lying in bed, peers debilitated and ill HEENT: Anicteric, conjunctival pink, lids and lashes normal.  No nasal deformity, discharge, or epistaxis.  Oropharynx dry, lips dry, dentition in good repair.   Skin: Hematoma on the right elbow, moderate in size, contained by foam dressing.  No other suspicious rashes on the face, neck, chest, arms, abdomen, or legs.   Cardiac: Tachycardic, regular, no murmurs.  1+ bilateral lower extremity edema. Respiratory: Respiratory effort very shallow, diminished lung sounds bilaterally, coarse breath sounds, difficult to auscultate due to poor inspiratory effort, nonfocal Abdomen: Abdomen soft without tenderness palpation or guarding, no ascites or distention MSK: No deformities or effusions of the large joints of the upper or lower extremities bilaterally other than the hematoma in the right elbow. Neuro: Awake, sleepy, speech hypophonic, bradykinetic, extraocular movements intact, 2/5 strength bilaterally.   Psych: Appropriate to questions, attention seems diminished, affect flat.  Judgment seems impaired   Data Reviewed: I have personally reviewed following labs and imaging studies:  CBC: Recent Labs  Lab 01/13/20 0510 01/14/20 0205 01/15/20 0612  WBC 17.8* 11.6* 9.5  NEUTROABS 15.1*  --   --   HGB 15.4 13.4 11.4*  HCT 44.4 39.1 34.3*  MCV 101.4* 101.0* 103.3*  PLT 134* 115* [id number]*   Basic Metabolic Panel: Recent Labs  Lab 01/13/20 0510 01/13/20 1423 01/14/20 0205 01/15/20 0612  NA 135 137 139 143  K 4.3 3.7 3.7 3.6  CL 101 102 105 108  CO2 20* 24 23 25   GLUCOSE 141* 123* 119* 126*  BUN 65* 52* 32* 23   CREATININE 6.19* 3.30* 1.28* 0.92  CALCIUM 8.9 8.7* 8.7* 8.5*  PHOS  --  3.6  --   --    GFR: Estimated Creatinine Clearance: 59.5 mL/min (by C-G formula based on SCr of 0.92 mg/dL). Liver Function Tests: Recent Labs  Lab 01/13/20 0510 01/13/20 1423  AST 17  --   ALT 6  --   ALKPHOS 71  --   BILITOT 1.1  --   PROT 6.7  --   ALBUMIN 3.6 3.2*   No results for input(s): LIPASE, AMYLASE in the last 168 hours. No results for input(s): AMMONIA in the last 168 hours. Coagulation Profile: No results for input(s): INR, PROTIME in the last 168 hours. Cardiac Enzymes: No results for input(s): CKTOTAL, CKMB, CKMBINDEX, TROPONINI in the last 168 hours. BNP (last 3 results) No results for input(s): PROBNP in the last 8760 hours. HbA1C: No results for  input(s): HGBA1C in the last 72 hours. CBG: No results for input(s): GLUCAP in the last 168 hours. Lipid Profile: Recent Labs    01/13/20 0511  TRIG 88   Thyroid Function Tests: No results for input(s): TSH, T4TOTAL, FREET4, T3FREE, THYROIDAB in the last 72 hours. Anemia Panel: Recent Labs    01/13/20 0511  FERRITIN 173   Urine analysis:    Component Value Date/Time   COLORURINE YELLOW 01/13/2020 0717   APPEARANCEUR CLEAR 01/13/2020 0717   LABSPEC 1.019 01/13/2020 0717   PHURINE 5.0 01/13/2020 0717   GLUCOSEU NEGATIVE 01/13/2020 0717   HGBUR LARGE (A) 01/13/2020 0717   BILIRUBINUR NEGATIVE 01/13/2020 0717   KETONESUR 5 (A) 01/13/2020 0717   PROTEINUR 30 (A) 01/13/2020 0717   NITRITE NEGATIVE 01/13/2020 0717   LEUKOCYTESUR NEGATIVE 01/13/2020 0717   Sepsis Labs: @LABRCNTIP (procalcitonin:4,lacticacidven:4)  ) Recent Results (from the past 240 hour(s))  Resp Panel by RT-PCR (Flu A&B, Covid) Nasopharyngeal Swab     Status: None   Collection Time: 01/13/20  4:20 AM   Specimen: Nasopharyngeal Swab; Nasopharyngeal(NP) swabs in vial transport medium  Result Value Ref Range Status   SARS Coronavirus 2 by RT PCR NEGATIVE  NEGATIVE Final    Comment: (NOTE) SARS-CoV-2 target nucleic acids are NOT DETECTED.  The SARS-CoV-2 RNA is generally detectable in upper respiratory specimens during the acute phase of infection. The lowest concentration of SARS-CoV-2 viral copies this assay can detect is 138 copies/mL. A negative result does not preclude SARS-Cov-2 infection and should not be used as the sole basis for treatment or other patient management decisions. A negative result may occur with  improper specimen collection/handling, submission of specimen other than nasopharyngeal swab, presence of viral mutation(s) within the areas targeted by this assay, and inadequate number of viral copies(<138 copies/mL). A negative result must be combined with clinical observations, patient history, and epidemiological information. The expected result is Negative.  Fact Sheet for Patients:  EntrepreneurPulse.com.au  Fact Sheet for Healthcare Providers:  IncredibleEmployment.be  This test is no t yet approved or cleared by the [place] FDA and  has been authorized for detection and/or diagnosis of SARS-CoV-2 by FDA under an Emergency Use Authorization (EUA). This EUA will remain  in effect (meaning this test can be used) for the duration of the COVID-19 declaration under Section 564(b)(1) of the Act, 21 U.S.C.section 360bbb-3(b)(1), unless the authorization is terminated  or revoked sooner.       Influenza A by PCR NEGATIVE NEGATIVE Final   Influenza B by PCR NEGATIVE NEGATIVE Final    Comment: (NOTE) The Xpert Xpress SARS-CoV-2/FLU/RSV plus assay is intended as an aid in the diagnosis of influenza from Nasopharyngeal swab specimens and should not be used as a sole basis for treatment. Nasal washings and aspirates are unacceptable for Xpert Xpress SARS-CoV-2/FLU/RSV testing.  Fact Sheet for Patients: EntrepreneurPulse.com.au  Fact Sheet for Healthcare  Providers: IncredibleEmployment.be  This test is not yet approved or cleared by the [place] FDA and has been authorized for detection and/or diagnosis of SARS-CoV-2 by FDA under an Emergency Use Authorization (EUA). This EUA will remain in effect (meaning this test can be used) for the duration of the COVID-19 declaration under Section 564(b)(1) of the Act, 21 U.S.C. section 360bbb-3(b)(1), unless the authorization is terminated or revoked.  Performed at [facility], [address] [place], [place] [address]   Blood Culture (routine x 2)     Status: None (Preliminary result)   Collection Time: 01/13/20  6:07  AM   Specimen: BLOOD RIGHT FOREARM  Result Value Ref Range Status   Specimen Description BLOOD RIGHT FOREARM  Final   Special Requests   Final    BOTTLES DRAWN AEROBIC AND ANAEROBIC Blood Culture adequate volume   Culture   Final    NO GROWTH <12 HOURS Performed at [facility], [address], [place], [place] [address]    Report Status PENDING  Incomplete  Blood Culture (routine x 2)     Status: None (Preliminary result)   Collection Time: 01/13/20  6:14 AM   Specimen: BLOOD  Result Value Ref Range Status   Specimen Description BLOOD LEFT ANTECUBITAL  Final   Special Requests   Final    BOTTLES DRAWN AEROBIC AND ANAEROBIC Blood Culture adequate volume   Culture   Final    NO GROWTH <12 HOURS Performed at [facility], [address], [place], [place] [address]    Report Status PENDING  Incomplete         Radiology Studies: CT ABDOMEN PELVIS WO CONTRAST  Result Date: 01/13/2020 CLINICAL DATA:  Diarrhea EXAM: CT ABDOMEN AND PELVIS WITHOUT CONTRAST TECHNIQUE: Multidetector CT imaging of the abdomen and pelvis was performed following the standard protocol without IV contrast. COMPARISON:  MRI 12/05/2018, CT 02/28/2018 FINDINGS: Lower chest: Significant motion degradation. Mild scarring and fibrosis at the bases. Mild bronchiectasis in the lower  lobes and right middle lobe. Cardiomegaly with aortic valve prosthesis. Coronary vascular calcification. Moderate hiatal hernia. Hepatobiliary: Hepatic cyst. No calcified gallstone or biliary dilatation Pancreas: Unremarkable. No pancreatic ductal dilatation or surrounding inflammatory changes. Spleen: Normal in size without focal abnormality. Adrenals/Urinary Tract: Adrenal glands are grossly normal. Hypodense lesion within the upper pole left kidney likely a cyst. No definitive hydronephrosis. Small amount of left left greater than right perinephric fluid. Ill-defined appearance of the left renal pelvis. Foley catheter in the urinary bladder which is decompressed. The urinary bladder appears thick walled. Small amount of gas in the bladder. Stomach/Bowel: The stomach is nonenlarged. No dilated small bowel. No acute bowel wall thickening. Moderate stool in the right colon. Possible mild left eccentric wall thickening at the rectum, series 2, image number 69. Vascular/Lymphatic: Advanced aortic atherosclerosis. Aneurysmal dilatation of the infrarenal abdominal aorta measuring 5.4 by 4.6 cm, previously 4.4 x 4.9 cm. Aneurysmal dilatation of right common iliac artery measuring 2.5 cm. No suspicious adenopathy. Reproductive: Prostate is enlarged. Prostate contains calcifications Other: Small amount of presacral fluid.  No free air. Musculoskeletal: Surgical hardware at L4-L5 with similar appearance of the hardware. Right L4 transpedicular screw tip touches the superior endplate of L4, left screwed tip at L4 is adjacent to the posterolateral margin of the L3-L4 disc space. IMPRESSION: 1. Significant motion degradation limits the examination. 2. Moderate stool in the right colon. No acute wall thickening is seen. Possible mild left eccentric wall thickening of the rectum which should be correlated with physical exam. 3. Mild to moderate left perinephric edema. Ill-defined hazy density at the left renal hilus raising  possibility of inflammatory process/ascending urinary tract infection though further evaluation is limited due to significant motion degradation. Bladder is decompressed but thick-walled which may be due to cystitis or chronic obstruction. Suggest correlation with urinalysis. 4. Slight interval increase in size of infrarenal abdominal aortic aneurysm measuring 5.4 by 4.6 cm, previously 4.9 x 4.4 cm. Recommend follow-up CT/MR every 6 months and vascular consultation Aortic Atherosclerosis (ICD10-I70.0). Electronically Signed   By: Donavan Foil M.D.   On: 01/13/2020 19:58  CT HEAD WO CONTRAST  Result Date: 01/13/2020 CLINICAL DATA:  Neuro deficit, acute, stroke suspected Swallowing difficulty and lower extremity weakness. EXAM: CT HEAD WITHOUT CONTRAST TECHNIQUE: Contiguous axial images were obtained from the base of the skull through the vertex without intravenous contrast. COMPARISON:  Head CT 04/16/2019, brain MRI 01/15/2019 FINDINGS: Brain: Stable size and appearance of the large right extra-axial lesion in the middle cranial fossa measuring approximately 7.0 x 3.1 x 6 cm, primarily isodose slightly hyperdense to adjacent brain parenchyma. There is similar mass effect on the adjacent structures with stable effacement of the right lateral ventricle and chronic 5 mm right to left midline shift. No evidence of acute or intralesional hemorrhage. No evidence of acute ischemia. The basilar cisterns are patent. No subdural collection. Vascular: Atherosclerosis of skullbase vasculature without hyperdense vessel or abnormal calcification. Skull: Mild heterogeneity of the skull wall subjacent to meningioma. No skull fracture or acute finding. Sinuses/Orbits: Paranasal sinuses and mastoid air cells are clear. The visualized orbits are unremarkable. Bilateral cataract resection. Other: None. IMPRESSION: 1. No acute intracranial abnormality. 2. Stable size and appearance of the large right middle cranial fossa  meningioma with similar mass effect on the adjacent structures and chronic 5 mm right to left midline shift. Electronically Signed   By: Narda Rutherford M.D.   On: 01/13/2020 18:28   NM Pulmonary Perfusion  Addendum Date: 01/14/2020   ADDENDUM REPORT: 01/14/2020 09:48 ADDENDUM: Critical Value/emergent results were called by telephone at the time of interpretation on 01/14/2020 at 9:47 am to provider [facility], who verbally acknowledged these results. Electronically Signed   By: Delbert Phenix M.D.   On: 01/14/2020 09:48   Result Date: 01/14/2020 CLINICAL DATA:  Inpatient.  Ed elevated D-dimer. EXAM: NUCLEAR MEDICINE PERFUSION LUNG SCAN TECHNIQUE: Perfusion images were obtained in multiple projections after intravenous injection of radiopharmaceutical. Ventilation scans intentionally deferred if perfusion scan and chest x-ray adequate for interpretation during COVID 19 epidemic. RADIOPHARMACEUTICALS:  4.4 mCi Tc-76m MAA IV COMPARISON:  Chest radiograph from one day prior. FINDINGS: Multiple bilateral moderate to large segmental perfusion defects, most prominent in the peripheral right mid lung. IMPRESSION: Multiple bilateral moderate to large segmental perfusion defects, considered high probability for acute pulmonary embolism. Electronically Signed: By: Delbert Phenix M.D. On: 01/14/2020 09:29   US RENAL  Result Date: 01/13/2020 CLINICAL DATA:  Acute kidney injury EXAM: RENAL / URINARY TRACT ULTRASOUND COMPLETE COMPARISON:  Ultrasound abdomen 06/05/2014.  MRI abdomen 12/05/2018 FINDINGS: Right Kidney: Renal measurements: 11.2 x 5.1 x 5.7 cm = volume: 169 mL. Echogenicity within normal limits. No mass or hydronephrosis visualized. Left Kidney: Renal measurements: 12.0 x 5.5 x 5.2 cm = volume: 206 mL. 2.5 cm left upper pole cyst stable from prior studies. Echogenicity within normal limits. No mass or hydronephrosis visualized. Bladder: Foley catheter with empty urinary bladder. Other: Bedside  examination. IMPRESSION: Normal renal size and echogenicity.  No renal obstruction. Electronically Signed   By: Marlan Palau M.D.   On: 01/13/2020 10:01   US Venous Img Lower Bilateral (DVT)  Result Date: 01/14/2020 CLINICAL DATA:  Pulmonary emboli, shortness of breath, elevated D-dimer EXAM: BILATERAL LOWER EXTREMITY VENOUS DOPPLER ULTRASOUND TECHNIQUE: Gray-scale sonography with graded compression, as well as color Doppler and duplex ultrasound were performed to evaluate the lower extremity deep venous systems from the level of the common femoral vein and including the common femoral, femoral, profunda femoral, popliteal and calf veins including the posterior tibial, peroneal and gastrocnemius veins when visible. The superficial great  saphenous vein was also interrogated. Spectral Doppler was utilized to evaluate flow at rest and with distal augmentation maneuvers in the common femoral, femoral and popliteal veins. COMPARISON:  None. FINDINGS: RIGHT LOWER EXTREMITY Common Femoral Vein: No evidence of thrombus. Normal compressibility, respiratory phasicity and response to augmentation. Saphenofemoral Junction: No evidence of thrombus. Normal compressibility and flow on color Doppler imaging. Profunda Femoral Vein: No evidence of thrombus. Normal compressibility and flow on color Doppler imaging. Femoral Vein: No evidence of thrombus. Normal compressibility, respiratory phasicity and response to augmentation. Popliteal Vein: No evidence of thrombus. Normal compressibility, respiratory phasicity and response to augmentation. Calf Veins: No evidence of thrombus. Normal compressibility and flow on color Doppler imaging. LEFT LOWER EXTREMITY Common Femoral Vein: No evidence of thrombus. Normal compressibility, respiratory phasicity and response to augmentation. Saphenofemoral Junction: No evidence of thrombus. Normal compressibility and flow on color Doppler imaging. Profunda Femoral Vein: No evidence of  thrombus. Normal compressibility and flow on color Doppler imaging. Femoral Vein: No evidence of thrombus. Normal compressibility, respiratory phasicity and response to augmentation. Popliteal Vein: No evidence of thrombus. Normal compressibility, respiratory phasicity and response to augmentation. Calf Veins: No evidence of thrombus. Normal compressibility and flow on color Doppler imaging. IMPRESSION: No evidence of deep venous thrombosis in either lower extremity. Electronically Signed   By: Jerilynn Mages.  Shick M.D.   On: 01/14/2020 15:05   ECHOCARDIOGRAM COMPLETE  Result Date: 01/14/2020    ECHOCARDIOGRAM REPORT   Patient Name:   DANIEL TIENKEN Date of Exam: 01/14/2020 Medical Rec #:  [id number]         Height:       70.0 in Accession #:    [id number]        Weight:       188.0 lb Date of Birth:  1931-11-22         BSA:          2.033 m Patient Age:    54 years          BP:           165/93 mmHg Patient Gender: M                 HR:           101 bpm. Exam Location:  [facility] Procedure: 2D Echo Indications:    Abnormal ECG R94.31  History:        Patient has prior history of Echocardiogram examinations, most                 recent 03/19/2019. CAD; Risk Factors:Dyslipidemia, Hypertension                 and Former Smoker. Parkinson's, TAVR March 2020, Elevated                 Troponin.  Sonographer:    Leavy Cella RDCS (AE) Referring Phys: [id number] COURAGE EMOKPAE IMPRESSIONS  1. Left ventricular ejection fraction, by estimation, is 70 to 75%. The left ventricle has hyperdynamic function. The left ventricle has no regional wall motion abnormalities. There is moderate left ventricular hypertrophy. Indeterminate diastolic filling due to E-A fusion.  2. Right ventricular systolic function is normal. The right ventricular size is normal.  3. Left atrial size was mildly dilated.  4. Trivial mitral valve regurgitation.  5. S/P TAVR with 29 mm Edwards Sapien prosthesis. Opens well Peak and mean gradients through the  valve are 16 and 9 mm Hg respectively. Trivial valvular AI. No  signifiant change from echo in March 2021 .  6. The inferior vena cava is dilated in size with >50% respiratory variability, suggesting right atrial pressure of 8 mmHg. FINDINGS  Left Ventricle: Left ventricular ejection fraction, by estimation, is 70 to 75%. The left ventricle has hyperdynamic function. The left ventricle has no regional wall motion abnormalities. The left ventricular internal cavity size was normal in size. There is moderate left ventricular hypertrophy. Indeterminate diastolic filling due to E-A fusion. Right Ventricle: The right ventricular size is normal. Right vetricular wall thickness was not assessed. Right ventricular systolic function is normal. Left Atrium: Left atrial size was mildly dilated. Right Atrium: Right atrial size was normal in size. Pericardium: There is no evidence of pericardial effusion. Mitral Valve: The mitral valve is normal in structure. Trivial mitral valve regurgitation. Tricuspid Valve: The tricuspid valve is normal in structure. Tricuspid valve regurgitation is trivial. Aortic Valve: S/P TAVR with 29 mm Edwards Sapien prosthesis. Opens well Peak and mean gradients through the valve are 16 and 9 mm Hg respectively. Trivial valvular AI. No signifiant change from echo in March 2021. The aortic valve has been repaired/replaced. Aortic valve regurgitation is trivial. Aortic regurgitation PHT measures 387 msec. Aortic valve mean gradient measures 9.3 mmHg. Aortic valve peak gradient measures 15.8 mmHg. Pulmonic Valve: The pulmonic valve was grossly normal. Pulmonic valve regurgitation is not visualized. Aorta: The aortic root is normal in size and structure. Venous: The inferior vena cava is dilated in size with greater than 50% respiratory variability, suggesting right atrial pressure of 8 mmHg. IAS/Shunts: The interatrial septum was not assessed.  LEFT VENTRICLE PLAX 2D LVIDd:         4.62 cm Diastology  LVIDs:         2.60 cm LV e' medial:    11.20 cm/s LV PW:         1.90 cm LV E/e' medial:  7.0 LV IVS:        1.66 cm LV e' lateral:   7.62 cm/s                        LV E/e' lateral: 10.3  RIGHT VENTRICLE RV S prime:     26.40 cm/s TAPSE (M-mode): 2.4 cm LEFT ATRIUM              Index       RIGHT ATRIUM           Index LA diam:        5.30 cm  2.61 cm/m  RA Area:     19.20 cm LA Vol (A2C):   121.0 ml 59.51 ml/m RA Volume:   59.70 ml  29.36 ml/m LA Vol (A4C):   67.9 ml  33.40 ml/m LA Biplane Vol: 92.3 ml  45.40 ml/m  AORTIC VALVE AV Vmax:           198.67 cm/s AV Vmean:          146.000 cm/s AV VTI:            0.361 m AV Peak Grad:      15.8 mmHg AV Mean Grad:      9.3 mmHg LVOT Vmax:         92.03 cm/s LVOT Vmean:        66.300 cm/s LVOT VTI:          0.177 m LVOT/AV VTI ratio: 0.49 AI PHT:  387 msec  AORTA Ao Root diam: 3.90 cm MITRAL VALVE MV Area (PHT): 2.92 cm     SHUNTS MV Decel Time: 260 msec     Systemic VTI: 0.18 m MV E velocity: 78.30 cm/s MV A velocity: 113.00 cm/s MV E/A ratio:  0.69 Dorris Carnes MD Electronically signed by Dorris Carnes MD Signature Date/Time: 01/14/2020/4:37:29 PM    Final         Scheduled Meds:  amLODipine  5 mg Oral Daily   vitamin C  500 mg Oral Daily   atorvastatin  40 mg Oral Daily   brimonidine  1 drop Both Eyes TID   carbidopa-levodopa  1 tablet Oral Q8H   carbidopa-levodopa  2 tablet Oral Q8H   Chlorhexidine Gluconate Cloth  6 each Topical Q0600   dorzolamide-timolol  1 drop Both Eyes BID   latanoprost  1 drop Both Eyes QHS   metoprolol succinate  25 mg Oral Daily   multivitamin with minerals  1 tablet Oral Daily   pantoprazole  40 mg Oral Daily   pramipexole  0.5 mg Oral TID   sodium chloride flush  3 mL Intravenous Q12H   sodium chloride flush  3 mL Intravenous Q12H   Continuous Infusions:  sodium chloride     cefTRIAXone (ROCEPHIN)  IV     heparin 1,550 Units/hr (01/14/20 1354)     LOS: 1 day    Time spent: 35  minutes    Edwin Dada, MD [facility] 01/15/2020, 9:43 AM     Please page though [place] or Epic secure chat:  For [employer], Adult nurse

## 2020-01-15 NOTE — Progress Notes (Signed)
ANTICOAGULATION CONSULT NOTE  Pharmacy Consult for heparin Indication: suspected PE   No Known Allergies  Patient Measurements: Height: 5\' 10"  (177.8 cm) Weight: 85.3 kg (188 lb) IBW/kg (Calculated) : 73 Heparin Dosing Weight: 85 kg  Vital Signs: Temp: 98.2 F (36.8 C) (12/30 1317) Temp Source: Oral (12/30 1317) BP: 126/53 (12/30 1317) Pulse Rate: 82 (12/30 1317)  Labs: Recent Labs    01/13/20 0510 01/13/20 0723 01/13/20 1423 01/14/20 0205 01/14/20 1012 01/14/20 1946 01/15/20 0612 01/15/20 1830  HGB 15.4  --   --  13.4  --   --  11.4*  --   HCT 44.4  --   --  39.1  --   --  34.3*  --   PLT 134*  --   --  115*  --   --  115*  --   HEPARINUNFRC  --   --   --  0.29*   < > 0.38 0.28* 0.39  CREATININE 6.19*  --  3.30* 1.28*  --   --  0.92  --   TROPONINIHS 115* 123*  --   --   --   --   --   --    < > = values in this interval not displayed.    Estimated Creatinine Clearance: 59.5 mL/min (by C-G formula based on SCr of 0.92 mg/dL).   Assessment: 86 yo M with suspected PE given D-dimer 14, dyspnea. Pharmacy consulted for heparin without bolus. No anticoagulation PTA.  Venous dopplers of BLE 01/14/20 were Negative for DVT. Earlier report of  Hematuria likely from foley placement.  This evening the heparin level is 0.39 therapeutic after heparin infusion was increased to 1700 units/hr. No issues with IV heparin drip and hematuria continues about the same per RN's report.  Goal of Therapy:  Heparin level 0.3-0.7 units/ml Monitor platelets by anticoagulation protocol: Yes   Plan:  Continue IV heparin drip at 1700 units/hr Monitor daily HL, CBC/plt Monitor for signs/symptoms of bleeding  Likely transition to oral anticoagulation soon.  Thanks for allowing pharmacy to be a part of this patient's care.  01/16/20, RPh Clinical Pharmacist 01/15/2020 7:15 PM

## 2020-01-15 NOTE — Progress Notes (Signed)
  Speech Language Pathology Treatment: Dysphagia  Patient Details Name: [name] MRN: [id number] DOB: [birth year] Today's Date: 01/15/2020 Time: 7829-5621 SLP Time Calculation (min) (ACUTE ONLY): 26 min  Assessment / Plan / Recommendation Clinical Impression  Pt seen for ongoing diagnostic dysphagia intervention following BSE completed yesterday. Pt with minimal po intake of puree and HTL, daughter visiting from [place] at bedside. Pt not yet back to baseline per family, less confused today than yesterday. Pt asks for water. Oral care provided and Pt given ice chips and small sips of water. Pt with inconsistent coughing after cup sips of water, but continues to request more. Pt declines po, only taking a bite or two from his breakfast and lunch tray per family. Chest xray from earlier today shows PNA. Recommend NPO except for ice chips and small sips of water after oral care when Pt is alert and upright and ok for po medications whole in puree. Plan for MBSS tomorrow to allow benefit of resumption of Parkinson's medications ( started them again yesterday afternoon). Pt is not wanting food at this time, but does want ice chips/water. Continue oral care. SLP will follow up tomorrow. Plan of care discussed with family and RN. Will message MD.     HPI HPI: [name] is a 86 y.o. male. Patient was initially coming in secondary to decreased urine output that also apparently was hypoxic in the triage.  He stated he had been short of breath. Pt with hx including Parkinson's disease (received some OP cognitive-lingustic ST targeting maintaining loudness and abdominal breathing coaching/treatment in 2018). Further pertinant medical hx including: AKI (acute kidney injury) (HCC) - principal problem, Brain Mass---right lateral frontotemporal extra-axial mass 7.1 cm with typical features of meningioma 02/28/18 (to follow-up w/ neurosurgeon Dr. Venetia Maxon), reported swallowing impairment, obstructive sleep apnea.  COVID - 19 test is negative, however reportedly people in the house where he lives are covid positive. Pt is reportedly having difficulty with swallowing and a bedside swallowing evaluation has been ordered.      SLP Plan  Continue with current plan of care;MBS       Recommendations  Diet recommendations: NPO (recommend NPO except po medications and ice chips) Medication Administration: Whole meds with puree Supervision: Staff to assist with self feeding Compensations: Slow rate;Small sips/bites;Follow solids with liquid;Clear throat intermittently;Multiple dry swallows after each bite/sip                Oral Care Recommendations: Oral care prior to ice chip/H20;Staff/trained caregiver to provide oral care Follow up Recommendations: Skilled Nursing facility SLP Visit Diagnosis: Dysphagia, unspecified (R13.10) Plan: Continue with current plan of care;MBS       Thank you,  Havery Moros, CCC-SLP [phone]                 Crystale Giannattasio 01/15/2020, 2:54 PM

## 2020-01-15 NOTE — Progress Notes (Signed)
EEG Completed; Results Pending  

## 2020-01-16 DIAGNOSIS — N179 Acute kidney failure, unspecified: Secondary | ICD-10-CM | POA: Diagnosis not present

## 2020-01-16 LAB — CBC
HCT: 36.5 % — ABNORMAL LOW (ref 39.0–52.0)
Hemoglobin: 12.5 g/dL — ABNORMAL LOW (ref 13.0–17.0)
MCH: 35.3 pg — ABNORMAL HIGH (ref 26.0–34.0)
MCHC: 34.2 g/dL (ref 30.0–36.0)
MCV: 103.1 fL — ABNORMAL HIGH (ref 80.0–100.0)
Platelets: 150 10*3/uL (ref 150–400)
RBC: 3.54 MIL/uL — ABNORMAL LOW (ref 4.22–5.81)
RDW: 13.3 % (ref 11.5–15.5)
WBC: 16.2 10*3/uL — ABNORMAL HIGH (ref 4.0–10.5)
nRBC: 0 % (ref 0.0–0.2)

## 2020-01-16 LAB — URINE CULTURE: Culture: NO GROWTH

## 2020-01-16 LAB — PROCALCITONIN: Procalcitonin: 0.11 ng/mL

## 2020-01-16 LAB — BASIC METABOLIC PANEL
Anion gap: 11 (ref 5–15)
BUN: 22 mg/dL (ref 8–23)
CO2: 24 mmol/L (ref 22–32)
Calcium: 8.3 mg/dL — ABNORMAL LOW (ref 8.9–10.3)
Chloride: 108 mmol/L (ref 98–111)
Creatinine, Ser: 0.79 mg/dL (ref 0.61–1.24)
GFR, Estimated: >60 mL/min (ref >60)
Glucose, Bld: 137 mg/dL — ABNORMAL HIGH (ref 70–99)
Potassium: 3.5 mmol/L (ref 3.5–5.1)
Sodium: 143 mmol/L (ref 135–145)

## 2020-01-16 LAB — HEPARIN LEVEL (UNFRACTIONATED): Heparin Unfractionated: 0.34 IU/mL (ref 0.30–0.70)

## 2020-01-16 MED ORDER — LOSARTAN POTASSIUM 50 MG PO TABS
50.0000 mg | ORAL_TABLET | Freq: Every day | ORAL | Status: DC
  Administered 2020-01-16 – 2020-01-18 (×3): 50 mg via ORAL
  Filled 2020-01-16 (×3): qty 1

## 2020-01-16 NOTE — Progress Notes (Signed)
Patient is unable to wear a CPAP unit at this time. RT will continue to monitor his status for any changes.

## 2020-01-16 NOTE — Progress Notes (Signed)
[facility] PROGRESS NOTE    [name]  [id number] DOB: 05-Apr-1931 DOA: 01/13/2020 PCP: Celene Squibb, MD      Brief Narrative:  [name] is a 86 y.o. M with Parkinson's disease, CAD s/p PCI last 02/2018, TAVR in 03/2018, OSA, meningioma and HTN who presented with diarrhea for 2 days and malaise/confusion.  3 days prior to admission, he developed frequent loose and watery diarrhea.  Over the next 24 to 48 hours, he became weaker and weaker until he could get up from a chair, and then seemed confused, so family brought him to the ER.  In the ER, he was weak and confused, also noted to be hypoxic and dyspneic.  D-dimer very elevated.  Cr noted to be >6 and bladder scan showed retention.  Foley placed and he was started on empiric heparin for PE, and admitted for IV fluids.         Assessment & Plan:  Acute pulmonary embolism Admitted and started on heparin empirically.  VQ scan showed bilateral PE.  Echo shows normal right heart function, Dopplers without DVT  -Continue heparin until hematuria resolved -Wean O2 as able -IS and flutter  Gross hematuria Initially suspected just foley trauma, urothelial injury from distension, but not clearing and actually worse today.    -Consult Urology, appreciate cares  -Manual flushes as needed -Hold aspirin   Community acquired pneumonia, right middle lobe Developed fever hospital day 2.  Though initial CXR clear, I suspect this was brewing prior to admission.  Infarct also possible, seems less likely in absence of chest pain..    -Continue Rocephin   Acute metabolic encephalopathy At baseline has only mild cognitive impairment.  Patient is normally ambulatory, without evidence of dementia although functionally limited and sedentary.    Here, he remains encephalopathic.  No improvement.    CT head unremarkable.  Suspect this is mostly due to critical illness, Sinemet deficiency.  Mirapex maybe  contributing.  Seizures and stroke ruled out.  -Delirium precautions   Acute renal failure due to obstruction Resolved  Parkinson's disease -Continue Sinemet   Coronary disease secondary prevention Severe aortic stenosis status post TAVR BP up -Continue amlodipine, atorvastatin, metoprolol -Reusme losartan -Hold aspirin   Sleep apnea -Continue CPAP as able  Meningioma Stable on MRI  GERD -Continue PPI          Disposition: Status is: Inpatient  Remains inpatient appropriate because:IV treatments appropriate due to intensity of illness or inability to take PO   Dispo: The patient is from: Home              Anticipated d/c is to: SNF              Anticipated d/c date is: > 3 days              Patient currently is not medically stable to d/c.              MDM: The below labs and imaging reports were reviewed and summarized above.  Medication management as above.  Severe illness with threat to life, blood thinner managed   DVT prophylaxis: SCDs Start: 01/13/20 0837 Place TED hose Start: 01/13/20 0837  Code Status: FULL Family Communication: Daughter in the room, will call son by phone    Consultants:   Urology           Subjective: Confusion is unchanged.  No new fever.  Hematoma in the right arm is resolved.  Hematuria seems worse today.  Fatigue, malaise unchanged.  He has no chest pain, vomiting, diarrhea.     .  Objective: Vitals:   01/15/20 2035 01/16/20 0521 01/16/20 0524 01/16/20 0636  BP: (!) 161/62 (!) 186/73    Pulse: 84 (!) 102    Resp: 19 20    Temp: 97.6 F (36.4 C) 98.6 F (37 C)    TempSrc:      SpO2: 94% (!) 85%  93%  Weight:   85.5 kg   Height:        Intake/Output Summary (Last 24 hours) at 01/16/2020 1646 Last data filed at 01/16/2020 1530 Gross per 24 hour  Intake 2938.03 ml  Output 2175 ml  Net 763.03 ml   Filed Weights   01/13/20 0358 01/16/20 0524  Weight: 85.3 kg 85.5 kg     Examination: General appearance: Elderly male, sitting in recliner, debilitated, confused HEENT:  Skin: Right elbow skin laceration improved. Cardiac: Tachycardic, irregular, no murmurs, right arm edema due to Ace wrap, moderate lower extremity edema, pitting Respiratory: Respiratory effort shallow, diminished lung sounds, I do not appreciate wheezing or rales due to poor respiratory effort Abdomen: Abdomen soft without tenderness palpation or guarding, no ascites or distention MSK:   Neuro: Awake but very sleepy.  Speech hypophonic, bradykinetic, tremor noted.  Moves upper and lower extremities with severe generalized weakness. Psych: Answers questions, but inappropriately today.  Confused as to where he is.  Seems to have more energy, but attention diminished.   Data Reviewed: I have personally reviewed following labs and imaging studies:  CBC: Recent Labs  Lab 01/13/20 0510 01/14/20 0205 01/15/20 0612 01/16/20 0607  WBC 17.8* 11.6* 9.5 16.2*  NEUTROABS 15.1*  --   --   --   HGB 15.4 13.4 11.4* 12.5*  HCT 44.4 39.1 34.3* 36.5*  MCV 101.4* 101.0* 103.3* 103.1*  PLT 134* 115* 115* 150   Basic Metabolic Panel: Recent Labs  Lab 01/13/20 0510 01/13/20 1423 01/14/20 0205 01/15/20 0612 01/16/20 0607  NA 135 137 139 143 143  K 4.3 3.7 3.7 3.6 3.5  CL 101 102 105 108 108  CO2 20* 24 23 25 24   GLUCOSE 141* 123* 119* 126* 137*  BUN 65* 52* 32* 23 22  CREATININE 6.19* 3.30* 1.28* 0.92 0.79  CALCIUM 8.9 8.7* 8.7* 8.5* 8.3*  PHOS  --  3.6  --   --   --    GFR: Estimated Creatinine Clearance: 68.4 mL/min (by C-G formula based on SCr of 0.79 mg/dL). Liver Function Tests: Recent Labs  Lab 01/13/20 0510 01/13/20 1423  AST 17  --   ALT 6  --   ALKPHOS 71  --   BILITOT 1.1  --   PROT 6.7  --   ALBUMIN 3.6 3.2*   No results for input(s): LIPASE, AMYLASE in the last 168 hours. No results for input(s): AMMONIA in the last 168 hours. Coagulation Profile: No results for  input(s): INR, PROTIME in the last 168 hours. Cardiac Enzymes: No results for input(s): CKTOTAL, CKMB, CKMBINDEX, TROPONINI in the last 168 hours. BNP (last 3 results) No results for input(s): PROBNP in the last 8760 hours. HbA1C: No results for input(s): HGBA1C in the last 72 hours. CBG: No results for input(s): GLUCAP in the last 168 hours. Lipid Profile: No results for input(s): CHOL, HDL, LDLCALC, TRIG, CHOLHDL, LDLDIRECT in the last 72 hours. Thyroid Function Tests: No results for input(s): TSH, T4TOTAL, FREET4, T3FREE, THYROIDAB in the last 72  hours. Anemia Panel: No results for input(s): VITAMINB12, FOLATE, FERRITIN, TIBC, IRON, RETICCTPCT in the last 72 hours. Urine analysis:    Component Value Date/Time   COLORURINE YELLOW 01/13/2020 [place] 01/13/2020 0717   LABSPEC 1.019 01/13/2020 0717   PHURINE 5.0 01/13/2020 0717   GLUCOSEU NEGATIVE 01/13/2020 0717   HGBUR LARGE (A) 01/13/2020 0717   BILIRUBINUR NEGATIVE 01/13/2020 0717   KETONESUR 5 (A) 01/13/2020 0717   PROTEINUR 30 (A) 01/13/2020 0717   NITRITE NEGATIVE 01/13/2020 0717   LEUKOCYTESUR NEGATIVE 01/13/2020 0717   Sepsis Labs: @LABRCNTIP (procalcitonin:4,lacticacidven:4)  ) Recent Results (from the past 240 hour(s))  Resp Panel by RT-PCR (Flu A&B, Covid) Nasopharyngeal Swab     Status: None   Collection Time: 01/13/20  4:20 AM   Specimen: Nasopharyngeal Swab; Nasopharyngeal(NP) swabs in vial transport medium  Result Value Ref Range Status   SARS Coronavirus 2 by RT PCR NEGATIVE NEGATIVE Final    Comment: (NOTE) SARS-CoV-2 target nucleic acids are NOT DETECTED.  The SARS-CoV-2 RNA is generally detectable in upper respiratory specimens during the acute phase of infection. The lowest concentration of SARS-CoV-2 viral copies this assay can detect is 138 copies/mL. A negative result does not preclude SARS-Cov-2 infection and should not be used as the sole basis for treatment or other patient  management decisions. A negative result may occur with  improper specimen collection/handling, submission of specimen other than nasopharyngeal swab, presence of viral mutation(s) within the areas targeted by this assay, and inadequate number of viral copies(<138 copies/mL). A negative result must be combined with clinical observations, patient history, and epidemiological information. The expected result is Negative.  Fact Sheet for Patients:  EntrepreneurPulse.com.au  Fact Sheet for Healthcare Providers:  IncredibleEmployment.be  This test is no t yet approved or cleared by the [place] FDA and  has been authorized for detection and/or diagnosis of SARS-CoV-2 by FDA under an Emergency Use Authorization (EUA). This EUA will remain  in effect (meaning this test can be used) for the duration of the COVID-19 declaration under Section 564(b)(1) of the Act, 21 U.S.C.section 360bbb-3(b)(1), unless the authorization is terminated  or revoked sooner.       Influenza A by PCR NEGATIVE NEGATIVE Final   Influenza B by PCR NEGATIVE NEGATIVE Final    Comment: (NOTE) The Xpert Xpress SARS-CoV-2/FLU/RSV plus assay is intended as an aid in the diagnosis of influenza from Nasopharyngeal swab specimens and should not be used as a sole basis for treatment. Nasal washings and aspirates are unacceptable for Xpert Xpress SARS-CoV-2/FLU/RSV testing.  Fact Sheet for Patients: EntrepreneurPulse.com.au  Fact Sheet for Healthcare Providers: IncredibleEmployment.be  This test is not yet approved or cleared by the [place] FDA and has been authorized for detection and/or diagnosis of SARS-CoV-2 by FDA under an Emergency Use Authorization (EUA). This EUA will remain in effect (meaning this test can be used) for the duration of the COVID-19 declaration under Section 564(b)(1) of the Act, 21 U.S.C. section 360bbb-3(b)(1),  unless the authorization is terminated or revoked.  Performed at [facility], [address] [place], [place] [address]   Blood Culture (routine x 2)     Status: None (Preliminary result)   Collection Time: 01/13/20  6:07 AM   Specimen: BLOOD RIGHT FOREARM  Result Value Ref Range Status   Specimen Description BLOOD RIGHT FOREARM  Final   Special Requests   Final    BOTTLES DRAWN AEROBIC AND ANAEROBIC Blood Culture adequate volume   Culture  Final    NO GROWTH 3 DAYS Performed at [facility], [address], [place], [place] [address]    Report Status PENDING  Incomplete  Blood Culture (routine x 2)     Status: None (Preliminary result)   Collection Time: 01/13/20  6:14 AM   Specimen: BLOOD  Result Value Ref Range Status   Specimen Description BLOOD LEFT ANTECUBITAL  Final   Special Requests   Final    BOTTLES DRAWN AEROBIC AND ANAEROBIC Blood Culture adequate volume   Culture   Final    NO GROWTH 3 DAYS Performed at [facility], [address], [place], [place] [address]    Report Status PENDING  Incomplete  Culture, Urine     Status: None   Collection Time: 01/14/20  3:01 PM   Specimen: Urine, Catheterized  Result Value Ref Range Status   Specimen Description   Final    URINE, CATHETERIZED Performed at [facility], [address] [place], [place] [address]    Special Requests   Final    NONE Performed at [facility], [address], [place], [place] [address]    Culture   Final    NO GROWTH Performed at [facility], [place] [address], [place], [place] [address]    Report Status 01/16/2020 FINAL  Final         Radiology Studies: EEG  Result Date: 01/15/2020 Lora Havens, MD     01/15/2020 10:34 PM Patient Name: NEILSON MELDE MRN: [id number] Epilepsy Attending: Lora Havens Referring Physician/Provider: Dr Myrene Buddy Date: 01/15/2020 Duration: 24.26 mins Patient history: 86yo M with ams. EEG to evaluate for seizure Level of alertness:  Awake,  asleep AEDs during EEG study: None Technical aspects: This EEG study was done with scalp electrodes positioned according to the 10-20 International system of electrode placement. Electrical activity was acquired at a sampling rate of 500Hz  and reviewed with a high frequency filter of 70Hz  and a low frequency filter of 1Hz . EEG data were recorded continuously and digitally stored. Description: No posterior dominant rhythm was seen. Sleep was characterized by vertex waves, sleep spindles (12 to 14 Hz), maximal frontocentral region.  EEG showed continuous generalized predominantly 5 to 6 Hz theta as well as intermittent 2-3Hz  delta slowing, at times with triphasic morphology. At 1640, patient was noted to have right hand movement as if grabbing blanket and moving in circles. Concomitant eeg before, during and after the event didn't show eeg change to suggest seizure. Hyperventilation and photic stimulation were not performed. ABNORMALITY -Continuous slow, generalized IMPRESSION: This study is suggestive of moderate diffuse encephalopathy, nonspecific etiology but could be secondary to toxic-metabolic etiology. No seizures or epileptiform discharges were seen throughout the recording. At 1640, patient was noted to have right hand movement as if grabbing blanket and moving in circles without concomitant eeg change and was NOT an epileptic event. Differentials include most likely tremor due to parkinson's disease vs sterotypies. Clinical correlation is recommended. Lora Havens   DG CHEST PORT 1 VIEW  Result Date: 01/15/2020 CLINICAL DATA:  Fever today.  Recent negative COVID-19 test. EXAM: PORTABLE CHEST 1 VIEW COMPARISON:  Single-view of the chest 01/13/2020. PA and lateral chest 01/06/2020. FINDINGS: The lungs are emphysematous. Bibasilar atelectasis/scar is unchanged. There is new airspace opacity in the right mid lung zone. Lungs otherwise clear. Heart size is upper normal. The patient is status post  aortic valve repair. Atherosclerosis noted. IMPRESSION: New airspace disease in the right mid  lung most consistent with pneumonia. Aortic Atherosclerosis (ICD10-I70.0) and Emphysema (ICD10-J43.9). Electronically Signed   By: Inge Rise M.D.   On: 01/15/2020 11:25        Scheduled Meds: . amLODipine  5 mg Oral Daily  . vitamin C  500 mg Oral Daily  . atorvastatin  40 mg Oral Daily  . brimonidine  1 drop Both Eyes TID  . carbidopa-levodopa  1 tablet Oral Q8H  . carbidopa-levodopa  2 tablet Oral Q8H  . Chlorhexidine Gluconate Cloth  6 each Topical Q0600  . dorzolamide-timolol  1 drop Both Eyes BID  . latanoprost  1 drop Both Eyes QHS  . losartan  50 mg Oral Daily  . metoprolol succinate  25 mg Oral Daily  . multivitamin with minerals  1 tablet Oral Daily  . pantoprazole  40 mg Oral Daily  . sodium chloride flush  3 mL Intravenous Q12H  . sodium chloride flush  3 mL Intravenous Q12H   Continuous Infusions: . sodium chloride 250 mL (01/15/20 1154)  . sodium chloride    . sodium chloride 75 mL/hr at 01/16/20 0451  . cefTRIAXone (ROCEPHIN)  IV 1 g (01/16/20 1002)  . heparin 1,700 Units/hr (01/16/20 1410)     LOS: 2 days    Time spent: 25 minutes    Edwin Dada, MD [facility] 01/16/2020, 4:46 PM     Please page though [place] or Epic secure chat:  For [employer], Adult nurse

## 2020-01-16 NOTE — Progress Notes (Addendum)
ANTICOAGULATION CONSULT NOTE  Pharmacy Consult for heparin Indication: suspected PE   No Known Allergies  Patient Measurements: Height: 5\' 10"  (177.8 cm) Weight: 85.5 kg (188 lb 7.9 oz) IBW/kg (Calculated) : 73 Heparin Dosing Weight: 85 kg  Vital Signs: Temp: 98.6 F (37 C) (12/31 0521) BP: 186/73 (12/31 0521) Pulse Rate: 102 (12/31 0521)  Labs: Recent Labs    01/14/20 0205 01/14/20 1012 01/15/20 0612 01/15/20 1830 01/16/20 0607  HGB 13.4  --  11.4*  --  12.5*  HCT 39.1  --  34.3*  --  36.5*  PLT 115*  --  115*  --  150  HEPARINUNFRC 0.29*   < > 0.28* 0.39 0.34  CREATININE 1.28*  --  0.92  --  0.79   < > = values in this interval not displayed.    Estimated Creatinine Clearance: 68.4 mL/min (by C-G formula based on SCr of 0.79 mg/dL).   Assessment: 86 yo M with suspected PE given D-dimer 14, dyspnea. Pharmacy consulted for heparin without bolus. No anticoagulation PTA.  Venous dopplers of BLE 01/14/20 were Negative for DVT. Earlier report of  hematuria likely from foley placement.     Goal of Therapy:  Heparin level 0.3-0.7 units/ml Monitor platelets by anticoagulation protocol: Yes    01/16/20 1100 update:  HL: 0.34 IU/mL-->in goal range on heparin at 1700 units/hr CBC: Hb 12.5      Plates: 01/18/20  Patient  still has hematuria-->likely urothelia injury w/obstraction (per MD note)   Plan:  Continue  heparin infusion rate at 1700 units/hr Daily heparin level and CBC while on heparin Monitor for signs/symptoms of bleeding  F/U  transition to oral anti-coagulant.  Thanks for allowing pharmacy to be a part of this patient's care.    [phone], Pharm. D. Clinical Pharmacist 01/16/2020 11:13 AM

## 2020-01-16 NOTE — Progress Notes (Signed)
  Speech Language Pathology Treatment: Dysphagia  Patient Details Name: [name] MRN: [id number] DOB: 09-19-1931 Today's Date: 01/16/2020 Time: 4098-1191 SLP Time Calculation (min) (ACUTE ONLY): 25.97 min  Assessment / Plan / Recommendation Clinical Impression  Ongoing diagnostic dysphagia therapy provided this am, oral care provided, Pt was easily roused but demonstrates significant lethargy, little attempt to verbalize and when he does it is completely aphonic. Pt was provided single ice chips and tsp sips of water with suspected significant delay in swallowing trigger, inconsistent coughing and throat clearing. Continue to Recommend NPO except for SINGLE ice chips and small sips of water after oral care when Pt is alert and upright and ok for po medications whole in puree. Plan for MBSS when appropriate. Above to RN; Messaged MD and will continue efforts.    HPI HPI: [name] is a 86 y.o. male. Patient was initially coming in secondary to decreased urine output that also apparently was hypoxic in the triage.  He stated he had been short of breath. Pt with hx including Parkinson's disease (received some OP cognitive-lingustic ST targeting maintaining loudness and abdominal breathing coaching/treatment in 2018). Further pertinant medical hx including: AKI (acute kidney injury) (HCC) - principal problem, Brain Mass---right lateral frontotemporal extra-axial mass 7.1 cm with typical features of meningioma 02/28/18 (to follow-up w/ neurosurgeon Dr. Venetia Maxon), reported swallowing impairment, obstructive sleep apnea. COVID - 19 test is negative, however reportedly people in the house where he lives are covid positive. Pt is reportedly having difficulty with swallowing and a bedside swallowing evaluation has been ordered.      SLP Plan  Continue with current plan of care;MBS       Recommendations  Diet recommendations: NPO Medication Administration: Whole meds with puree Supervision:  Staff to assist with self feeding Compensations: Slow rate;Small sips/bites;Follow solids with liquid;Clear throat intermittently;Multiple dry swallows after each bite/sip                Oral Care Recommendations: Oral care prior to ice chip/H20;Staff/trained caregiver to provide oral care Follow up Recommendations: Skilled Nursing facility SLP Visit Diagnosis: Dysphagia, unspecified (R13.10) Plan: Continue with current plan of care;MBS       Zaylah Blecha H. Romie Levee, CCC-SLP Speech Language Pathologist    Georgetta Haber 01/16/2020, 8:45 AM

## 2020-01-16 NOTE — Progress Notes (Signed)
Physical Therapy Treatment Patient Details Name: [name] MRN: [id number] DOB: 1931/04/22 Today's Date: 01/16/2020    History of Present Illness [name]  is a 86 y.o. male reformed smoker with past medical history relevant for Parkinson's disease, HTN, history of brain mass, CAD with prior stent to the LAD back in February [id number], diastolic dysfunction CHF,  Who presents to the ED with concerns for shortness of breath and diarrhea of about 3 days duration--no emesis, but patient does have nausea and poor appetite    PT Comments    Patient demonstrates slow labored movement for sitting up at bedside, frequently leaning forward once seated at bedside due to weak back extensor muscles, very unsteady on feet during transfers [facility] and chair, limited to a few steps at bedside due to fall risk and BLE weakness.  Patient tolerated sitting up in chair after therapy with family member present in room.  Patient will benefit from continued physical therapy in hospital and recommended venue below to increase strength, balance, endurance for safe ADLs and gait.     Follow Up Recommendations  SNF;Supervision for mobility/OOB;Supervision/Assistance - 24 hour     Equipment Recommendations  None recommended by PT    Recommendations for Other Services       Precautions / Restrictions Precautions Precautions: Fall Restrictions Weight Bearing Restrictions: No    Mobility  Bed Mobility Overal bed mobility: Needs Assistance Bed Mobility: Supine to Sit     Supine to sit: Mod assist;Max assist     General bed mobility comments: slow labored movement with difficulty moving legs due to weakness  Transfers Overall transfer level: Needs assistance Equipment used: Rolling walker (2 wheeled) Transfers: Sit to/from [facility] Sit to Stand: Mod assist;Max assist Stand pivot transfers: Mod assist       General transfer comment: most difficulty completing sit to stands  due to BLE weakness  Ambulation/Gait Ambulation/Gait assistance: Mod assist;Max assist Gait Distance (Feet): 6 Feet Assistive device: Rolling walker (2 wheeled) Gait Pattern/deviations: Decreased step length - right;Decreased step length - left;Decreased stride length;Shuffle Gait velocity: decreased   General Gait Details: limited to 5-6 slow unsteady labored side steps at bedside, had difficulty extending trunk due to generalized weakness and fatigue   Stairs             Wheelchair Mobility    Modified Rankin (Stroke Patients Only)       Balance Overall balance assessment: Needs assistance Sitting-balance support: Feet supported;No upper extremity supported Sitting balance-Leahy Scale: Fair Sitting balance - Comments: seated at EOB, frequent leaning forward   Standing balance support: During functional activity;Bilateral upper extremity supported Standing balance-Leahy Scale: Poor Standing balance comment: fair/poor using RW                            Cognition Arousal/Alertness: Awake/alert Behavior During Therapy: WFL for tasks assessed/performed Overall Cognitive Status: Within Functional Limits for tasks assessed                                        Exercises General Exercises - Lower Extremity Ankle Circles/Pumps: Seated;AROM;Strengthening;Both;5 reps    General Comments        Pertinent Vitals/Pain Pain Assessment: No/denies pain    Home Living  Prior Function            PT Goals (current goals can now be found in the care plan section) Acute Rehab PT Goals Patient Stated Goal: return home with paid attendants and family to assist PT Goal Formulation: With patient/family Time For Goal Achievement: 01/28/20 Potential to Achieve Goals: Good Progress towards PT goals: Progressing toward goals    Frequency    Min 3X/week      PT Plan      Co-evaluation               AM-PAC PT "6 Clicks" Mobility   Outcome Measure  Help needed turning from your back to your side while in a flat bed without using bedrails?: A Lot Help needed moving from lying on your back to sitting on the side of a flat bed without using bedrails?: A Lot Help needed moving to and from a bed to a chair (including a wheelchair)?: A Lot Help needed standing up from a chair using your arms (e.g., wheelchair or bedside chair)?: A Lot Help needed to walk in hospital room?: A Lot Help needed climbing 3-5 steps with a railing? : Total 6 Click Score: 11    End of Session Equipment Utilized During Treatment: Oxygen Activity Tolerance: Patient tolerated treatment well;Patient limited by fatigue Patient left: in chair;with call bell/phone within reach;with chair alarm set;with family/visitor present Nurse Communication: Mobility status PT Visit Diagnosis: Unsteadiness on feet (R26.81);Other abnormalities of gait and mobility (R26.89);Muscle weakness (generalized) (M62.81)     Time: 2992-4268 PT Time Calculation (min) (ACUTE ONLY): 32 min  Charges:  $Therapeutic Activity: 23-37 mins                     1:45 PM, 01/16/20 [name], MPT Physical Therapist with [facility] [phone] office [phone] mobile phone

## 2020-01-16 NOTE — Plan of Care (Signed)

## 2020-01-17 ENCOUNTER — Encounter: Payer: Self-pay | Admitting: Neurology

## 2020-01-17 DIAGNOSIS — N179 Acute kidney failure, unspecified: Secondary | ICD-10-CM | POA: Diagnosis not present

## 2020-01-17 LAB — PROCALCITONIN: Procalcitonin: 0.22 ng/mL

## 2020-01-17 LAB — BASIC METABOLIC PANEL
Anion gap: 9 (ref 5–15)
BUN: 23 mg/dL (ref 8–23)
CO2: 24 mmol/L (ref 22–32)
Calcium: 8.4 mg/dL — ABNORMAL LOW (ref 8.9–10.3)
Chloride: 112 mmol/L — ABNORMAL HIGH (ref 98–111)
Creatinine, Ser: 0.93 mg/dL (ref 0.61–1.24)
GFR, Estimated: >60 mL/min (ref >60)
Glucose, Bld: 133 mg/dL — ABNORMAL HIGH (ref 70–99)
Potassium: 3.2 mmol/L — ABNORMAL LOW (ref 3.5–5.1)
Sodium: 145 mmol/L (ref 135–145)

## 2020-01-17 LAB — CBC
HCT: 31.3 % — ABNORMAL LOW (ref 39.0–52.0)
Hemoglobin: 10.6 g/dL — ABNORMAL LOW (ref 13.0–17.0)
MCH: 34.9 pg — ABNORMAL HIGH (ref 26.0–34.0)
MCHC: 33.9 g/dL (ref 30.0–36.0)
MCV: 103 fL — ABNORMAL HIGH (ref 80.0–100.0)
Platelets: 165 10*3/uL (ref 150–400)
RBC: 3.04 MIL/uL — ABNORMAL LOW (ref 4.22–5.81)
RDW: 13.2 % (ref 11.5–15.5)
WBC: 12.3 10*3/uL — ABNORMAL HIGH (ref 4.0–10.5)
nRBC: 0 % (ref 0.0–0.2)

## 2020-01-17 LAB — MAGNESIUM: Magnesium: 1.8 mg/dL (ref 1.7–2.4)

## 2020-01-17 LAB — HEPARIN LEVEL (UNFRACTIONATED): Heparin Unfractionated: 0.31 IU/mL (ref 0.30–0.70)

## 2020-01-17 MED ORDER — RESOURCE THICKENUP CLEAR PO POWD
ORAL | Status: DC | PRN
  Filled 2020-01-17: qty 125

## 2020-01-17 MED ORDER — POTASSIUM CHLORIDE 10 MEQ/100ML IV SOLN
10.0000 meq | INTRAVENOUS | Status: AC
  Administered 2020-01-17 (×4): 10 meq via INTRAVENOUS
  Filled 2020-01-17 (×4): qty 100

## 2020-01-17 MED ORDER — METOPROLOL SUCCINATE ER 50 MG PO TB24
100.0000 mg | ORAL_TABLET | Freq: Every day | ORAL | Status: DC
  Administered 2020-01-17 – 2020-01-18 (×2): 100 mg via ORAL
  Filled 2020-01-17 (×2): qty 2

## 2020-01-17 MED ORDER — SODIUM CHLORIDE 0.9 % IR SOLN
3000.0000 mL | Status: DC
  Administered 2020-01-17: 3000 mL

## 2020-01-17 NOTE — Progress Notes (Signed)
Patients daughter called nurse to room stating patient complaining of lower abdomen and bladder hurting. This nurse turned off continuous irrigation to bladder. Called Dr. Maryfrances Bunnell to inform him that patient was hurting. Bladder scanned patient. Bladder scan showed greater than 200. Dr. Maryfrances Bunnell stated to call on call urologist Dr Benancio Deeds.  Dr. Benancio Deeds informed nurse to take out 18 french triple lumen foley and place a 22 triple lumen foley and to irrigate bladder. This nurse called AC to bring 22 french triple lumen foley. AC stated she would bring to 300. This nurse reported off to night shift nurse stating that [facility] would bring 22 french triple lumen foley.

## 2020-01-17 NOTE — Progress Notes (Signed)
ANTICOAGULATION CONSULT NOTE  Pharmacy Consult for heparin Indication: suspected PE   No Known Allergies  Patient Measurements: Height: 5\' 10"  (177.8 cm) Weight: 85.5 kg (188 lb 7.9 oz) IBW/kg (Calculated) : 73 Heparin Dosing Weight: 85 kg  Vital Signs: Temp: 98.1 F (36.7 C) (01/01 0624) Temp Source: Oral (01/01 0624) BP: 170/78 (01/01 0624) Pulse Rate: 105 (01/01 0624)  Labs: Recent Labs    01/15/20 0612 01/15/20 1830 01/16/20 0607 01/17/20 0720  HGB 11.4*  --  12.5* 10.6*  HCT 34.3*  --  36.5* 31.3*  PLT 115*  --  150 165  HEPARINUNFRC 0.28* 0.39 0.34 0.31  CREATININE 0.92  --  0.79 0.93    Estimated Creatinine Clearance: 58.9 mL/min (by C-G formula based on SCr of 0.93 mg/dL).   Assessment: 87 yo M with suspected PE given D-dimer 14, dyspnea. Pharmacy consulted for heparin without bolus. No anticoagulation PTA.  Venous dopplers of BLE 01/14/20 were Negative for DVT. Earlier report of  Hematuria likely from foley placement.  This evening the heparin level is 0.39 therapeutic after heparin infusion was increased to 1700 units/hr. No issues with IV heparin drip and hematuria continues about the same per RN's report.  Goal of Therapy:  Heparin level 0.3-0.7 units/ml Monitor platelets by anticoagulation protocol: Yes    01/17/20 0800 update:  HL: 0.31 IU/mL-->just barely in goal range on heparin at 1700 units/hr CBC: Hb 12.5>10.5        Plates: 03/16/20  Patient  still with hematuria-->likely urothelia injury w/obstraction (per MD note)   Plan:  Increase  heparin infusion rate to  1750 units/hr so that patient will not fall out of range Daily heparin level and CBC while on heparin Monitor for signs/symptoms of bleeding  F/U  transition to oral anti-coagulant.  Thanks for allowing pharmacy to be a part of this patient's care.    [phone], Pharm. D. Clinical Pharmacist 01/17/2020 8:48 AM

## 2020-01-17 NOTE — Progress Notes (Signed)
[facility] PROGRESS NOTE    [name]  [id number] DOB: 1931-03-21 DOA: 01/13/2020 PCP: Celene Squibb, MD      Brief Narrative:  [name] is a 87 y.o. M with Parkinson's disease, CAD s/p PCI last 02/2018, TAVR in 03/2018, OSA, meningioma and HTN who presented with diarrhea for 2 days and malaise/confusion.  3 days prior to admission, he developed frequent loose and watery diarrhea.  Over the next 24 to 48 hours, he became weaker and weaker until he could get up from a chair, and then seemed confused, so family brought him to the ER.  In the ER, he was weak and confused, also noted to be hypoxic and dyspneic.  D-dimer very elevated.  Cr noted to be >6 and bladder scan showed retention.  Foley placed and he was started on empiric heparin for PE, and admitted for IV fluids.         Assessment & Plan:  Acute pulmonary embolism Admitted and started on heparin empirically.  VQ scan showed bilateral PE.  Echo shows normal right heart function, Dopplers without DVT  Persistently hypoxic. -Continue heparin (until hematuria resolved) -Aggressive incentive spirometry and flutter   Obstructive uropathy with renal failure Acute kidney injury Renal function resolved with placement of foley and fluids.  Cr stable.  Gross hematuria Suspect Foley trauma, prostatic bleeding.  Discussed with urology again today, will place three-way catheter in start continuous bladder irrigation -Hold aspirin -Consult urology  -Per Urology, Dr. Milford Cage: no indication for cystocopy yet, but place 3-way and start CBI -- if urine does not clear with CBI over the next 24 to 48 hours, hospitalist will discuss cystoscopy with Dr. Alyson Ingles    Atrial fibrillation, new onset Assessment atrial fibrillation noted this morning. -Increase metoprolol -Continue heparin   Hypokalemia -Supplement potassium -Check magnesium   Community-acquired pneumonia, right middle lobe Developed  fever hospital day 2.    Repeat chest x-ray showed right middle lobe opacity -Continue Rocephin, day 3 of 5     Acute metabolic encephalopathy At baseline has only mild cognitive impairment.  Patient is normally ambulatory, without evidence of dementia although functionally limited and sedentary.  CT head unremarkable, seizures and stroke ruled out.  Improving with antibiotics and stopping Mirapex -Delirium precautions   Parkinson's disease -Continue Sinemet  Coronary artery disease, secondary prevention Severe aortic stenosis status post TAVR Hypertension Blood pressure still somewhat up -Continue amlodipine, atorvastatin -Increase metoprolol -Continue losartan -Hold aspirin         Sleep apnea -Continue CPAP as able  Meningioma Stable on MRI  GERD -Continue PPI          Disposition: Status is: Inpatient  Remains inpatient appropriate because:IV treatments appropriate due to intensity of illness or inability to take PO   Dispo: The patient is from: Home              Anticipated d/c is to: SNF              Anticipated d/c date is: > 3 days              Patient currently is not medically stable to d/c.      The patient was admitted with diarrhea, found to have PE and obstructive uropathy.  Neuropathy and renal failure are resolved.  PE is being treated with heparin, will transition to an oral anticoagulant has been delayed by development of hematuria.  Will need resolution of hematuria prior to transitioning to  an oral anticoagulant.  Still needs aggressive mobilization and will likely need rehabilitation stay for prolonged period of time before being able to return home        MDM: The below labs and imaging reports were reviewed and summarized above.  Medication management as above.  Severe illness with threat to life, blood thinner managed   DVT prophylaxis: SCDs Start: 01/13/20 0837 Place TED hose Start: 01/13/20 0837  Code Status:  FULL Family Communication: Daughter in person, son by phone    Consultants:   Urology           Subjective: Confusion is unchanged.  No new fever resolved, right arm without bleeding.  Hematuria dense.  Fatigue and malaise are unchanged, but to me he appears more alert and interactive and oriented.  Still hypophonic and very weak.  No diarrhea or hematochezia.  No complaints of chest pain or dyspnea although he appears out of breath   .  Objective: Vitals:   01/16/20 0636 01/16/20 2159 01/17/20 0624 01/17/20 0856  BP:  (!) 150/77 (!) 170/78 139/70  Pulse:  90 (!) 105 (!) 108  Resp:  20 20   Temp:  97.9 F (36.6 C) 98.1 F (36.7 C)   TempSrc:   Oral   SpO2: 93% 92% 90%   Weight:      Height:        Intake/Output Summary (Last 24 hours) at 01/17/2020 1136 Last data filed at 01/17/2020 0700 Gross per 24 hour  Intake 1972.58 ml  Output 700 ml  Net 1272.58 ml   Filed Weights   01/13/20 0358 01/16/20 0524  Weight: 85.3 kg 85.5 kg    Examination: General appearance: Elderly male, lying in bed, weak, tired, but interactive when prompted     HEENT:    Skin: Skin tear in the right arm is improved, no bleeding to the dressing.  Numerous other senile purpura, no suspicious rashes or lesions Cardiac: Tachycardic, irregular, 1+ lower extremity edema Respiratory: Respirations shallow, lung sounds diminished, I do not appreciate wheezing or rales, but this is difficult due to poor inspiratory effort Abdomen: Abdomen soft without tenderness palpation or guarding, no ascites or distention MSK: Diffuse loss of subcutaneous muscle mass and fat Neuro: Remains somewhat bradykinetic, speech hypophonic, tremor noted with movement.  Has severe generalized weakness but symmetric strength. Psych: Oriented to person, place, situation, somewhat more interactive, but still very sedated.  Attention diminished.  Affect blunted.         Data Reviewed: I have personally reviewed  following labs and imaging studies:  CBC: Recent Labs  Lab 01/13/20 0510 01/14/20 0205 01/15/20 0612 01/16/20 0607 01/17/20 0720  WBC 17.8* 11.6* 9.5 16.2* 12.3*  NEUTROABS 15.1*  --   --   --   --   HGB 15.4 13.4 11.4* 12.5* 10.6*  HCT 44.4 39.1 34.3* 36.5* 31.3*  MCV 101.4* 101.0* 103.3* 103.1* 103.0*  PLT 134* 115* 115* 150 165   Basic Metabolic Panel: Recent Labs  Lab 01/13/20 1423 01/14/20 0205 01/15/20 0612 01/16/20 0607 01/17/20 0720  NA 137 139 143 143 145  K 3.7 3.7 3.6 3.5 3.2*  CL 102 105 108 108 112*  CO2 24 23 25 24 24   GLUCOSE 123* 119* 126* 137* 133*  BUN 52* 32* 23 22 23   CREATININE 3.30* 1.28* 0.92 0.79 0.93  CALCIUM 8.7* 8.7* 8.5* 8.3* 8.4*  PHOS 3.6  --   --   --   --    GFR: Estimated  Creatinine Clearance: 58.9 mL/min (by C-G formula based on SCr of 0.93 mg/dL). Liver Function Tests: Recent Labs  Lab 01/13/20 0510 01/13/20 1423  AST 17  --   ALT 6  --   ALKPHOS 71  --   BILITOT 1.1  --   PROT 6.7  --   ALBUMIN 3.6 3.2*   No results for input(s): LIPASE, AMYLASE in the last 168 hours. No results for input(s): AMMONIA in the last 168 hours. Coagulation Profile: No results for input(s): INR, PROTIME in the last 168 hours. Cardiac Enzymes: No results for input(s): CKTOTAL, CKMB, CKMBINDEX, TROPONINI in the last 168 hours. BNP (last 3 results) No results for input(s): PROBNP in the last 8760 hours. HbA1C: No results for input(s): HGBA1C in the last 72 hours. CBG: No results for input(s): GLUCAP in the last 168 hours. Lipid Profile: No results for input(s): CHOL, HDL, LDLCALC, TRIG, CHOLHDL, LDLDIRECT in the last 72 hours. Thyroid Function Tests: No results for input(s): TSH, T4TOTAL, FREET4, T3FREE, THYROIDAB in the last 72 hours. Anemia Panel: No results for input(s): VITAMINB12, FOLATE, FERRITIN, TIBC, IRON, RETICCTPCT in the last 72 hours. Urine analysis:    Component Value Date/Time   COLORURINE YELLOW 01/13/2020 [place] 01/13/2020 0717   LABSPEC 1.019 01/13/2020 0717   PHURINE 5.0 01/13/2020 0717   GLUCOSEU NEGATIVE 01/13/2020 0717   HGBUR LARGE (A) 01/13/2020 0717   BILIRUBINUR NEGATIVE 01/13/2020 0717   KETONESUR 5 (A) 01/13/2020 0717   PROTEINUR 30 (A) 01/13/2020 0717   NITRITE NEGATIVE 01/13/2020 0717   LEUKOCYTESUR NEGATIVE 01/13/2020 0717   Sepsis Labs: @LABRCNTIP (procalcitonin:4,lacticacidven:4)  ) Recent Results (from the past 240 hour(s))  Resp Panel by RT-PCR (Flu A&B, Covid) Nasopharyngeal Swab     Status: None   Collection Time: 01/13/20  4:20 AM   Specimen: Nasopharyngeal Swab; Nasopharyngeal(NP) swabs in vial transport medium  Result Value Ref Range Status   SARS Coronavirus 2 by RT PCR NEGATIVE NEGATIVE Final    Comment: (NOTE) SARS-CoV-2 target nucleic acids are NOT DETECTED.  The SARS-CoV-2 RNA is generally detectable in upper respiratory specimens during the acute phase of infection. The lowest concentration of SARS-CoV-2 viral copies this assay can detect is 138 copies/mL. A negative result does not preclude SARS-Cov-2 infection and should not be used as the sole basis for treatment or other patient management decisions. A negative result may occur with  improper specimen collection/handling, submission of specimen other than nasopharyngeal swab, presence of viral mutation(s) within the areas targeted by this assay, and inadequate number of viral copies(<138 copies/mL). A negative result must be combined with clinical observations, patient history, and epidemiological information. The expected result is Negative.  Fact Sheet for Patients:  EntrepreneurPulse.com.au  Fact Sheet for Healthcare Providers:  IncredibleEmployment.be  This test is no t yet approved or cleared by the [place] FDA and  has been authorized for detection and/or diagnosis of SARS-CoV-2 by FDA under an Emergency Use Authorization (EUA). This  EUA will remain  in effect (meaning this test can be used) for the duration of the COVID-19 declaration under Section 564(b)(1) of the Act, 21 U.S.C.section 360bbb-3(b)(1), unless the authorization is terminated  or revoked sooner.       Influenza A by PCR NEGATIVE NEGATIVE Final   Influenza B by PCR NEGATIVE NEGATIVE Final    Comment: (NOTE) The Xpert Xpress SARS-CoV-2/FLU/RSV plus assay is intended as an aid in the diagnosis of influenza from Nasopharyngeal swab specimens and should not be  used as a sole basis for treatment. Nasal washings and aspirates are unacceptable for Xpert Xpress SARS-CoV-2/FLU/RSV testing.  Fact Sheet for Patients: EntrepreneurPulse.com.au  Fact Sheet for Healthcare Providers: IncredibleEmployment.be  This test is not yet approved or cleared by the [place] FDA and has been authorized for detection and/or diagnosis of SARS-CoV-2 by FDA under an Emergency Use Authorization (EUA). This EUA will remain in effect (meaning this test can be used) for the duration of the COVID-19 declaration under Section 564(b)(1) of the Act, 21 U.S.C. section 360bbb-3(b)(1), unless the authorization is terminated or revoked.  Performed at [facility], [address], [place], [place] [address]   Blood Culture (routine x 2)     Status: None (Preliminary result)   Collection Time: 01/13/20  6:07 AM   Specimen: BLOOD RIGHT FOREARM  Result Value Ref Range Status   Specimen Description BLOOD RIGHT FOREARM  Final   Special Requests   Final    BOTTLES DRAWN AEROBIC AND ANAEROBIC Blood Culture adequate volume   Culture   Final    NO GROWTH 4 DAYS Performed at [facility], [address] [place], [place] [address]    Report Status PENDING  Incomplete  Blood Culture (routine x 2)     Status: None (Preliminary result)   Collection Time: 01/13/20  6:14 AM   Specimen: BLOOD  Result Value Ref Range Status   Specimen Description BLOOD  LEFT ANTECUBITAL  Final   Special Requests   Final    BOTTLES DRAWN AEROBIC AND ANAEROBIC Blood Culture adequate volume   Culture   Final    NO GROWTH 4 DAYS Performed at [facility], [address] [place], [place] [address]    Report Status PENDING  Incomplete  Culture, Urine     Status: None   Collection Time: 01/14/20  3:01 PM   Specimen: Urine, Catheterized  Result Value Ref Range Status   Specimen Description   Final    URINE, CATHETERIZED Performed at [facility], [address] [place], [place] [address]    Special Requests   Final    NONE Performed at [facility], [address], [place], [place] [address]    Culture   Final    NO GROWTH Performed at [facility], [place] [address], [place], [place] [address]    Report Status 01/16/2020 FINAL  Final         Radiology Studies: EEG  Result Date: 01/15/2020 Lora Havens, MD     01/15/2020 10:34 PM Patient Name: XANDYR DELACUEVA MRN: [id number] Epilepsy Attending: Lora Havens Referring Physician/Provider: Dr Myrene Buddy Date: 01/15/2020 Duration: 24.26 mins Patient history: 87yo M with ams. EEG to evaluate for seizure Level of alertness: Awake,  asleep AEDs during EEG study: None Technical aspects: This EEG study was done with scalp electrodes positioned according to the 10-20 International system of electrode placement. Electrical activity was acquired at a sampling rate of 500Hz  and reviewed with a high frequency filter of 70Hz  and a low frequency filter of 1Hz . EEG data were recorded continuously and digitally stored. Description: No posterior dominant rhythm was seen. Sleep was characterized by vertex waves, sleep spindles (12 to 14 Hz), maximal frontocentral region.  EEG showed continuous generalized predominantly 5 to 6 Hz theta as well as intermittent 2-3Hz  delta slowing, at times with triphasic morphology. At 1640, patient was noted to have right hand movement as if grabbing blanket and moving in  circles. Concomitant eeg before, during and  after the event didn't show eeg change to suggest seizure. Hyperventilation and photic stimulation were not performed. ABNORMALITY -Continuous slow, generalized IMPRESSION: This study is suggestive of moderate diffuse encephalopathy, nonspecific etiology but could be secondary to toxic-metabolic etiology. No seizures or epileptiform discharges were seen throughout the recording. At 1640, patient was noted to have right hand movement as if grabbing blanket and moving in circles without concomitant eeg change and was NOT an epileptic event. Differentials include most likely tremor due to parkinson's disease vs sterotypies. Clinical correlation is recommended. Priyanka Barbra Sarks        Scheduled Meds: . amLODipine  5 mg Oral Daily  . vitamin C  500 mg Oral Daily  . atorvastatin  40 mg Oral Daily  . brimonidine  1 drop Both Eyes TID  . carbidopa-levodopa  1 tablet Oral Q8H  . carbidopa-levodopa  2 tablet Oral Q8H  . Chlorhexidine Gluconate Cloth  6 each Topical Q0600  . dorzolamide-timolol  1 drop Both Eyes BID  . latanoprost  1 drop Both Eyes QHS  . losartan  50 mg Oral Daily  . metoprolol succinate  100 mg Oral Daily  . multivitamin with minerals  1 tablet Oral Daily  . pantoprazole  40 mg Oral Daily  . sodium chloride flush  3 mL Intravenous Q12H  . sodium chloride flush  3 mL Intravenous Q12H   Continuous Infusions: . sodium chloride 250 mL (01/15/20 1154)  . sodium chloride    . sodium chloride 75 mL/hr at 01/17/20 0526  . cefTRIAXone (ROCEPHIN)  IV 1 g (01/17/20 0913)  . heparin 1,750 Units/hr (01/17/20 0854)  . sodium chloride irrigation       LOS: 3 days    Time spent: 35 minutes    Edwin Dada, MD [facility] 01/17/2020, 11:36 AM     Please page though [place] or Epic secure chat:  For [employer], Adult nurse

## 2020-01-18 DIAGNOSIS — N179 Acute kidney failure, unspecified: Secondary | ICD-10-CM | POA: Diagnosis not present

## 2020-01-18 DIAGNOSIS — Z515 Encounter for palliative care: Secondary | ICD-10-CM

## 2020-01-18 LAB — BASIC METABOLIC PANEL
Anion gap: 4 — ABNORMAL LOW (ref 5–15)
BUN: 28 mg/dL — ABNORMAL HIGH (ref 8–23)
CO2: 25 mmol/L (ref 22–32)
Calcium: 8.2 mg/dL — ABNORMAL LOW (ref 8.9–10.3)
Chloride: 113 mmol/L — ABNORMAL HIGH (ref 98–111)
Creatinine, Ser: 1.08 mg/dL (ref 0.61–1.24)
GFR, Estimated: >60 mL/min (ref >60)
Glucose, Bld: 138 mg/dL — ABNORMAL HIGH (ref 70–99)
Potassium: 3.3 mmol/L — ABNORMAL LOW (ref 3.5–5.1)
Sodium: 142 mmol/L (ref 135–145)

## 2020-01-18 LAB — HEPARIN LEVEL (UNFRACTIONATED): Heparin Unfractionated: 0.29 IU/mL — ABNORMAL LOW (ref 0.30–0.70)

## 2020-01-18 LAB — CBC
HCT: 30 % — ABNORMAL LOW (ref 39.0–52.0)
Hemoglobin: 9.8 g/dL — ABNORMAL LOW (ref 13.0–17.0)
MCH: 34 pg (ref 26.0–34.0)
MCHC: 32.7 g/dL (ref 30.0–36.0)
MCV: 104.2 fL — ABNORMAL HIGH (ref 80.0–100.0)
Platelets: 199 10*3/uL (ref 150–400)
RBC: 2.88 MIL/uL — ABNORMAL LOW (ref 4.22–5.81)
RDW: 13.2 % (ref 11.5–15.5)
WBC: 13.4 10*3/uL — ABNORMAL HIGH (ref 4.0–10.5)
nRBC: 0.1 % (ref 0.0–0.2)

## 2020-01-18 LAB — CULTURE, BLOOD (ROUTINE X 2)
Culture: NO GROWTH
Culture: NO GROWTH
Special Requests: ADEQUATE
Special Requests: ADEQUATE

## 2020-01-18 MED ORDER — MORPHINE SULFATE (CONCENTRATE) 10 MG/0.5ML PO SOLN
10.0000 mg | ORAL | 0 refills | Status: AC | PRN
Start: 2020-01-18 — End: ?

## 2020-01-18 MED ORDER — GLYCOPYRROLATE 0.2 MG/ML IJ SOLN
0.1000 mg | Freq: Once | INTRAMUSCULAR | Status: AC
  Administered 2020-01-18: 0.1 mg via INTRAVENOUS
  Filled 2020-01-18: qty 1

## 2020-01-18 MED ORDER — MORPHINE SULFATE 10 MG/5ML PO SOLN: 5.0000 mg | ORAL | Status: DC | PRN

## 2020-01-18 MED ORDER — LORAZEPAM 1 MG PO TABS: 1.0000 mg | ORAL_TABLET | ORAL | Status: DC | PRN

## 2020-01-18 MED ORDER — POTASSIUM CHLORIDE CRYS ER 20 MEQ PO TBCR
40.0000 meq | EXTENDED_RELEASE_TABLET | Freq: Two times a day (BID) | ORAL | Status: AC
  Administered 2020-01-18 (×2): 40 meq via ORAL
  Filled 2020-01-18 (×2): qty 2

## 2020-01-18 MED ORDER — MORPHINE SULFATE 15 MG PO TABS: 7.5000 mg | ORAL_TABLET | ORAL | 0 refills | Status: AC | PRN

## 2020-01-18 MED ORDER — LORAZEPAM 1 MG PO TABS: 1.0000 mg | ORAL_TABLET | ORAL | 0 refills | Status: AC | PRN

## 2020-01-18 MED ORDER — GLYCOPYRROLATE 1 MG PO TABS: 1.0000 mg | ORAL_TABLET | Freq: Three times a day (TID) | ORAL | 0 refills | Status: AC | PRN

## 2020-01-18 MED ORDER — MORPHINE SULFATE (CONCENTRATE) 10 MG/0.5ML PO SOLN
10.0000 mg | ORAL | Status: DC | PRN
  Administered 2020-01-18: 10 mg via ORAL
  Filled 2020-01-18: qty 0.5

## 2020-01-18 NOTE — Progress Notes (Signed)
ANTICOAGULATION CONSULT NOTE  Pharmacy Consult for heparin Indication: suspected PE   No Known Allergies  Patient Measurements: Height: 5\' 10"  (177.8 cm) Weight: 85.5 kg (188 lb 7.9 oz) IBW/kg (Calculated) : 73 Heparin Dosing Weight: 85 kg  Vital Signs: Temp: 98.7 F (37.1 C) (01/02 0509) BP: 137/107 (01/02 0829) Pulse Rate: 97 (01/02 0829)  Labs: Recent Labs    01/16/20 0607 01/17/20 0720 01/18/20 0701  HGB 12.5* 10.6* 9.8*  HCT 36.5* 31.3* 30.0*  PLT 150 165 199  HEPARINUNFRC 0.34 0.31 0.29*  CREATININE 0.79 0.93 1.08    Estimated Creatinine Clearance: 50.7 mL/min (by C-G formula based on SCr of 1.08 mg/dL).   Assessment: 87 yo M with suspected PE given D-dimer 14, dyspnea. Pharmacy consulted for heparin without bolus. No anticoagulation PTA.  Venous dopplers of BLE 01/14/20 were Negative for DVT. Earlier report of  Hematuria likely from foley placement.   .  Goal of Therapy:  Heparin level 0.3-0.7 units/ml Monitor platelets by anticoagulation protocol: Yes    01/18/20 0800 update:  HL: 0.29 IU/mL-->just below goal range on heparin at 1700 units/hr CBC: Hb 12.5>10.5>9.8         Plates: 03/17/20  RN reports no bleeding complication or issues with infusion site   Plan:  Increase  heparin infusion rate to  1850 units/hr   Daily heparin level and CBC while on heparin Monitor for signs/symptoms of bleeding  F/U  transition to oral anti-coagulant.  Thanks for allowing pharmacy to be a part of this patient's care.    [phone], Pharm. D. Clinical Pharmacist 01/18/2020 9:16 AM

## 2020-01-18 NOTE — Progress Notes (Signed)
Pulled patient up in bed and repositioned. O2 at 92%.

## 2020-01-18 NOTE — TOC Transition Note (Signed)
Transition of Care [facility]) - CM/SW Discharge Note   Patient Details  Name: [name] MRN: [id number] Date of Birth: Sep 01, 1931  [facility] of [facility]) CM/SW Contact:  Barry Brunner, LCSW Phone Number: 01/18/2020, 3:46 PM   Clinical Narrative:    CSW notified of patient's readiness for discharge and families request for home hospice. CSW referred patient to [facility]. [facility] agreeable to provide hospice services to patient. CSW notified Victorino Dike with [facility] of patients order for hospital bed and oxygen. Patient's son reported that they currently had an O2 concentrator at home and would not need the hospital bed immediately upon discharge because patient would be able to sit in recliner. Wallis Bamberg with [facility] hospice reported that patient would be able to discharge tonight and receive his equipment in the morning. CSW completed med necessity and called EMS. TOC signing off.    Final next level of care: Home w Hospice Care Barriers to Discharge: Barriers Resolved   Patient Goals and CMS Choice Patient states their goals for this hospitalization and ongoing recovery are:: Home with Hospice CMS Medicare.gov Compare Post Acute Care list provided to:: Patient Choice offered to / list presented to : Patient  Discharge Placement                Patient to be transferred to facility by: [facility] Name of family member notified: Noel Christmas Patient and family notified of of transfer: 01/18/20  Discharge Plan and Services                DME Arranged: Oxygen,Hospital bed (Will be provided by [facility] via [employer]) DME Agency: [employer] Date DME Agency Contacted: 01/18/20 Time DME Agency Contacted: [phone] Representative spoke with at DME Agency: Jenifer with [facility]            Social Determinants of Health (SDOH) Interventions     Readmission Risk Interventions Readmission Risk Prevention Plan 01/18/2020 01/14/2020  Transportation  Screening Complete Complete  PCP or Specialist Appt within 5-7 Days Complete -  Home Care Screening Complete Complete  Medication Review (RN CM) Complete Complete  Some recent data might be hidden

## 2020-01-18 NOTE — Progress Notes (Signed)
SLP Cancellation Note  Patient Details Name: [name] MRN: [id number] DOB: 12/07/1931   Cancelled treatment:        Pt will be going home with hospice later today therefore there are no further ST needs noted at this time, SLP Spoke with family and will now defer to Hospice for education and implementation of comfort diet/feeds. Thank you,  [name] H. Romie Levee, CCC-SLP Speech Language Pathologist    Georgetta Haber 01/18/2020, 1:58 PM

## 2020-01-18 NOTE — Discharge Summary (Signed)
Physician Discharge Summary  [name] [id number] DOB: 08-22-1931 DOA: 01/13/2020  PCP: Celene Squibb, MD  Admit date: 01/13/2020 Discharge date: 01/18/2020  Admitted From: Home  Disposition:  Home with Hospice        Brief/Interim Summary: [name] is a 87 y.o. M with Parkinson's disease, CAD s/p PCI last 02/2018, TAVR in 03/2018, OSA, meningioma and HTN who presented with diarrhea for 2 days and malaise/confusion.  3 days prior to admission, he developed frequent loose and watery diarrhea.  Over the next 24 to 48 hours, he became weaker and weaker until he could get up from a chair, and then seemed confused, so family brought him to the ER.  In the ER, he was weak and confused, also noted to be hypoxic and dyspneic.  D-dimer very elevated.  Cr noted to be >6 and bladder scan showed retention.  Foley placed and he was started on empiric heparin for PE, and admitted for IV fluids.          PRINCIPAL HOSPITAL DIAGNOSIS: Acute pulmonary embolism complicated by pneumonia and urinary hemorrhage    Discharge Diagnoses:   Acute pulmonary embolism Obstructive uropathy with renal failure Acute kidney injury Gross hematuria Atrial fibrillation, new onset, paroxysmal Community-acquired pneumonia, right middle lobe, likely POA   Admitted and started on heparin empirically.  VQ scan showed bilateral PE.  Echo showed normal right heart function, normal EF, TAVR functioning normally. Dopplers without DVT  Renal function resolved with placement of foley and fluids.   Hematuria developed within 24 hours of initiation of heparin.  Source unclear, discussed with Urology.  Contraindicated to stop heparin in setting of minor bleed and pulmonary embolism.  Continuous bladder irrigation started but resulted in significant discomfort for patient.    In this setting, patient developed a new fever on hospital day 2, and repeat chest x-ray showed a new right middle lobe opacity.   Ceftriaxone was started, as well as aggressive IS and flutter and mobilization.   In the setting of the patient's chronic Parkinson's and limited prior function/mobility, the patient had limited improvement with above treatments.  Today, the patient asked his family to go home.  We had a discussion at bedside with patient, son, and two daughters regarding alternative courses of action.  I do concur, there appears to be a dmiinishing chance that the patient will recover to be able to return home and perform ADLs independently, even in a best case scenario with aggressive treatment with CBI, cystoscopy, further antibiotics and prolonged rehab stay.  In light of this, Hospice is reasonable.  I suspect the patient will have less than 6 weeks life expectancy if he stops antibiotics, anticoagulants.           Hypokalemia          Parkinson's disease Coronary artery disease, secondary prevention Severe aortic stenosis status post TAVR Hypertension Sleep apnea Meningioma GERD                Discharge Instructions  Discharge Instructions    Diet - low sodium heart healthy   Complete by: As directed    Increase activity slowly   Complete by: As directed      Allergies as of 01/18/2020   No Known Allergies     Medication List    STOP taking these medications   amoxicillin 500 MG tablet Commonly known as: AMOXIL   aspirin 81 MG chewable tablet   atorvastatin 40 MG tablet Commonly known as:  LIPITOR   brimonidine 0.2 % ophthalmic solution Commonly known as: ALPHAGAN   calcium gluconate 500 MG tablet   glucosamine-chondroitin 500-400 MG tablet   losartan 100 MG tablet Commonly known as: COZAAR   multivitamin tablet   nitroGLYCERIN 0.4 MG SL tablet Commonly known as: Nitrostat   pramipexole 0.5 MG tablet Commonly known as: MIRAPEX   vitamin C 500 MG tablet Commonly known as: ASCORBIC ACID     TAKE these medications   amLODipine 5 MG  tablet Commonly known as: NORVASC Take 1 tablet (5 mg total) by mouth daily.   carbidopa-levodopa 25-100 MG tablet Commonly known as: SINEMET IR TAKE 2 TABLETS BY MOUTH AT  6AM, 1 TABLET AT 9AM, 2  TABLETS AT 12PM, 1 TABLET  AT 3PM, 2 TABLETS AT 6PM,  AND 1 TABLET  AT 9PM What changed:   how much to take  how to take this  when to take this   dorzolamide-timolol 22.3-6.8 MG/ML ophthalmic solution Commonly known as: COSOPT Place 1 drop into both eyes 2 (two) times daily.   glycopyrrolate 1 MG tablet Commonly known as: Robinul Take 1 tablet (1 mg total) by mouth 3 (three) times daily as needed (for secretions in the mouth).   latanoprost 0.005 % ophthalmic solution Commonly known as: XALATAN Place 1 drop into both eyes at bedtime.   LORazepam 1 MG tablet Commonly known as: ATIVAN Take 1 tablet (1 mg total) by mouth every 4 (four) hours as needed for anxiety.   metoprolol succinate 25 MG 24 hr tablet Commonly known as: TOPROL-XL Take 1 tablet (25 mg total) by mouth daily.   morphine 15 MG tablet Commonly known as: MSIR Take 0.5-1 tablets (7.5-15 mg total) by mouth every 4 (four) hours as needed for moderate pain or severe pain (or trouble breathing).   morphine CONCENTRATE 10 MG/0.5ML Soln concentrated solution Take 0.5 mLs (10 mg total) by mouth every 2 (two) hours as needed for severe pain.   pantoprazole 20 MG tablet Commonly known as: [place] 1 TABLET BY MOUTH  DAILY            Durable Medical Equipment  (From admission, onward)         Start     Ordered   01/18/20 1117  For home use only DME Hospital bed  Once       Question Answer Comment  Length of Need Lifetime   Patient has (list medical condition): pulmonary embolism and parkinsons   Bed type Semi-electric      01/18/20 1116   01/18/20 1116  DME Oxygen  (Discharge Planning)  Once       Question Answer Comment  Length of Need Lifetime   Mode or (Route) Nasal cannula   Liters per Minute 4    Frequency Continuous (stationary and portable oxygen unit needed)   Oxygen conserving device No   Oxygen delivery system Gas      01/18/20 1116          Contact information for after-discharge care    [facility] .   Service: Skilled Nursing Contact information: [address] [place] [phone]                 No Known Allergies     Procedures/Studies: EEG  Result Date: 01/15/2020 Nicholas Havens, MD     01/15/2020 10:34 PM Patient Name: [name] MRN: [id number] Epilepsy Attending: Cecil Cranker  Hortense Ramal Referring Physician/Provider: Dr Myrene Buddy Date: 01/15/2020 Duration: 24.26 mins Patient history: 87yo M with ams. EEG to evaluate for seizure Level of alertness: Awake,  asleep AEDs during EEG study: None Technical aspects: This EEG study was done with scalp electrodes positioned according to the 10-20 International system of electrode placement. Electrical activity was acquired at a sampling rate of 500Hz  and reviewed with a high frequency filter of 70Hz  and a low frequency filter of 1Hz . EEG data were recorded continuously and digitally stored. Description: No posterior dominant rhythm was seen. Sleep was characterized by vertex waves, sleep spindles (12 to 14 Hz), maximal frontocentral region.  EEG showed continuous generalized predominantly 5 to 6 Hz theta as well as intermittent 2-3Hz  delta slowing, at times with triphasic morphology. At 1640, patient was noted to have right hand movement as if grabbing blanket and moving in circles. Concomitant eeg before, during and after the event didn't show eeg change to suggest seizure. Hyperventilation and photic stimulation were not performed. ABNORMALITY -Continuous slow, generalized IMPRESSION: This study is suggestive of moderate diffuse encephalopathy, nonspecific etiology but could be secondary to toxic-metabolic etiology. No seizures or  epileptiform discharges were seen throughout the recording. At 1640, patient was noted to have right hand movement as if grabbing blanket and moving in circles without concomitant eeg change and was NOT an epileptic event. Differentials include most likely tremor due to parkinson's disease vs sterotypies. Clinical correlation is recommended. Priyanka Barbra Sarks   CT ABDOMEN PELVIS WO CONTRAST  Result Date: 01/13/2020 CLINICAL DATA:  Diarrhea EXAM: CT ABDOMEN AND PELVIS WITHOUT CONTRAST TECHNIQUE: Multidetector CT imaging of the abdomen and pelvis was performed following the standard protocol without IV contrast. COMPARISON:  MRI 12/05/2018, CT 02/28/2018 FINDINGS: Lower chest: Significant motion degradation. Mild scarring and fibrosis at the bases. Mild bronchiectasis in the lower lobes and right middle lobe. Cardiomegaly with aortic valve prosthesis. Coronary vascular calcification. Moderate hiatal hernia. Hepatobiliary: Hepatic cyst. No calcified gallstone or biliary dilatation Pancreas: Unremarkable. No pancreatic ductal dilatation or surrounding inflammatory changes. Spleen: Normal in size without focal abnormality. Adrenals/Urinary Tract: Adrenal glands are grossly normal. Hypodense lesion within the upper pole left kidney likely a cyst. No definitive hydronephrosis. Small amount of left left greater than right perinephric fluid. Ill-defined appearance of the left renal pelvis. Foley catheter in the urinary bladder which is decompressed. The urinary bladder appears thick walled. Small amount of gas in the bladder. Stomach/Bowel: The stomach is nonenlarged. No dilated small bowel. No acute bowel wall thickening. Moderate stool in the right colon. Possible mild left eccentric wall thickening at the rectum, series 2, image number 69. Vascular/Lymphatic: Advanced aortic atherosclerosis. Aneurysmal dilatation of the infrarenal abdominal aorta measuring 5.4 by 4.6 cm, previously 4.4 x 4.9 cm. Aneurysmal dilatation  of right common iliac artery measuring 2.5 cm. No suspicious adenopathy. Reproductive: Prostate is enlarged. Prostate contains calcifications Other: Small amount of presacral fluid.  No free air. Musculoskeletal: Surgical hardware at L4-L5 with similar appearance of the hardware. Right L4 transpedicular screw tip touches the superior endplate of L4, left screwed tip at L4 is adjacent to the posterolateral margin of the L3-L4 disc space. IMPRESSION: 1. Significant motion degradation limits the examination. 2. Moderate stool in the right colon. No acute wall thickening is seen. Possible mild left eccentric wall thickening of the rectum which should be correlated with physical exam. 3. Mild to moderate left perinephric edema. Ill-defined hazy density at the left renal hilus raising possibility of inflammatory process/ascending urinary tract  infection though further evaluation is limited due to significant motion degradation. Bladder is decompressed but thick-walled which may be due to cystitis or chronic obstruction. Suggest correlation with urinalysis. 4. Slight interval increase in size of infrarenal abdominal aortic aneurysm measuring 5.4 by 4.6 cm, previously 4.9 x 4.4 cm. Recommend follow-up CT/MR every 6 months and vascular consultation Aortic Atherosclerosis (ICD10-I70.0). Electronically Signed   By: Donavan Foil M.D.   On: 01/13/2020 19:58   DG Chest 2 View  Result Date: 01/06/2020 CLINICAL DATA:  Cough. EXAM: CHEST - 2 VIEW COMPARISON:  Radiographs 04/07/2019 and 03/19/2018. FINDINGS: The heart size and mediastinal contours are stable status post TAVR and coronary artery stenting. There is chronic interstitial lung disease which may be slowly progressive at the lung bases. No superimposed airspace disease, edema, pleural effusion or pneumothorax identified. There are postsurgical changes in the lumbar spine. No acute osseous findings are seen. IMPRESSION: Chronic interstitial lung disease, possibly  slowly progressive at the lung bases. No acute cardiopulmonary process. Electronically Signed   By: Richardean Sale M.D.   On: 01/06/2020 13:36   CT HEAD WO CONTRAST  Result Date: 01/13/2020 CLINICAL DATA:  Neuro deficit, acute, stroke suspected Swallowing difficulty and lower extremity weakness. EXAM: CT HEAD WITHOUT CONTRAST TECHNIQUE: Contiguous axial images were obtained from the base of the skull through the vertex without intravenous contrast. COMPARISON:  Head CT 04/16/2019, brain MRI 01/15/2019 FINDINGS: Brain: Stable size and appearance of the large right extra-axial lesion in the middle cranial fossa measuring approximately 7.0 x 3.1 x 6 cm, primarily isodose slightly hyperdense to adjacent brain parenchyma. There is similar mass effect on the adjacent structures with stable effacement of the right lateral ventricle and chronic 5 mm right to left midline shift. No evidence of acute or intralesional hemorrhage. No evidence of acute ischemia. The basilar cisterns are patent. No subdural collection. Vascular: Atherosclerosis of skullbase vasculature without hyperdense vessel or abnormal calcification. Skull: Mild heterogeneity of the skull wall subjacent to meningioma. No skull fracture or acute finding. Sinuses/Orbits: Paranasal sinuses and mastoid air cells are clear. The visualized orbits are unremarkable. Bilateral cataract resection. Other: None. IMPRESSION: 1. No acute intracranial abnormality. 2. Stable size and appearance of the large right middle cranial fossa meningioma with similar mass effect on the adjacent structures and chronic 5 mm right to left midline shift. Electronically Signed   By: Keith Rake M.D.   On: 01/13/2020 18:28   NM Pulmonary Perfusion  Addendum Date: 01/14/2020   ADDENDUM REPORT: 01/14/2020 09:48 ADDENDUM: Critical Value/emergent results were called by telephone at the time of interpretation on 01/14/2020 at 9:47 am to provider [facility], who verbally  acknowledged these results. Electronically Signed   By: Ilona Sorrel M.D.   On: 01/14/2020 09:48   Result Date: 01/14/2020 CLINICAL DATA:  Inpatient.  Ed elevated D-dimer. EXAM: NUCLEAR MEDICINE PERFUSION LUNG SCAN TECHNIQUE: Perfusion images were obtained in multiple projections after intravenous injection of radiopharmaceutical. Ventilation scans intentionally deferred if perfusion scan and chest x-ray adequate for interpretation during COVID 19 epidemic. RADIOPHARMACEUTICALS:  4.4 mCi Tc-77m MAA IV COMPARISON:  Chest radiograph from one day prior. FINDINGS: Multiple bilateral moderate to large segmental perfusion defects, most prominent in the peripheral right mid lung. IMPRESSION: Multiple bilateral moderate to large segmental perfusion defects, considered high probability for acute pulmonary embolism. Electronically Signed: By: Ilona Sorrel M.D. On: 01/14/2020 09:29   US RENAL  Result Date: 01/13/2020 CLINICAL DATA:  Acute kidney injury EXAM: RENAL / URINARY TRACT  ULTRASOUND COMPLETE COMPARISON:  Ultrasound abdomen 06/05/2014.  MRI abdomen 12/05/2018 FINDINGS: Right Kidney: Renal measurements: 11.2 x 5.1 x 5.7 cm = volume: 169 mL. Echogenicity within normal limits. No mass or hydronephrosis visualized. Left Kidney: Renal measurements: 12.0 x 5.5 x 5.2 cm = volume: 206 mL. 2.5 cm left upper pole cyst stable from prior studies. Echogenicity within normal limits. No mass or hydronephrosis visualized. Bladder: Foley catheter with empty urinary bladder. Other: Bedside examination. IMPRESSION: Normal renal size and echogenicity.  No renal obstruction. Electronically Signed   By: Franchot Gallo M.D.   On: 01/13/2020 10:01   US Venous Img Lower Bilateral (DVT)  Result Date: 01/14/2020 CLINICAL DATA:  Pulmonary emboli, shortness of breath, elevated D-dimer EXAM: BILATERAL LOWER EXTREMITY VENOUS DOPPLER ULTRASOUND TECHNIQUE: Gray-scale sonography with graded compression, as well as color Doppler and duplex  ultrasound were performed to evaluate the lower extremity deep venous systems from the level of the common femoral vein and including the common femoral, femoral, profunda femoral, popliteal and calf veins including the posterior tibial, peroneal and gastrocnemius veins when visible. The superficial great saphenous vein was also interrogated. Spectral Doppler was utilized to evaluate flow at rest and with distal augmentation maneuvers in the common femoral, femoral and popliteal veins. COMPARISON:  None. FINDINGS: RIGHT LOWER EXTREMITY Common Femoral Vein: No evidence of thrombus. Normal compressibility, respiratory phasicity and response to augmentation. Saphenofemoral Junction: No evidence of thrombus. Normal compressibility and flow on color Doppler imaging. Profunda Femoral Vein: No evidence of thrombus. Normal compressibility and flow on color Doppler imaging. Femoral Vein: No evidence of thrombus. Normal compressibility, respiratory phasicity and response to augmentation. Popliteal Vein: No evidence of thrombus. Normal compressibility, respiratory phasicity and response to augmentation. Calf Veins: No evidence of thrombus. Normal compressibility and flow on color Doppler imaging. LEFT LOWER EXTREMITY Common Femoral Vein: No evidence of thrombus. Normal compressibility, respiratory phasicity and response to augmentation. Saphenofemoral Junction: No evidence of thrombus. Normal compressibility and flow on color Doppler imaging. Profunda Femoral Vein: No evidence of thrombus. Normal compressibility and flow on color Doppler imaging. Femoral Vein: No evidence of thrombus. Normal compressibility, respiratory phasicity and response to augmentation. Popliteal Vein: No evidence of thrombus. Normal compressibility, respiratory phasicity and response to augmentation. Calf Veins: No evidence of thrombus. Normal compressibility and flow on color Doppler imaging. IMPRESSION: No evidence of deep venous thrombosis in either  lower extremity. Electronically Signed   By: Jerilynn Mages.  Shick M.D.   On: 01/14/2020 15:05   DG CHEST PORT 1 VIEW  Result Date: 01/15/2020 CLINICAL DATA:  Fever today.  Recent negative COVID-19 test. EXAM: PORTABLE CHEST 1 VIEW COMPARISON:  Single-view of the chest 01/13/2020. PA and lateral chest 01/06/2020. FINDINGS: The lungs are emphysematous. Bibasilar atelectasis/scar is unchanged. There is new airspace opacity in the right mid lung zone. Lungs otherwise clear. Heart size is upper normal. The patient is status post aortic valve repair. Atherosclerosis noted. IMPRESSION: New airspace disease in the right mid lung most consistent with pneumonia. Aortic Atherosclerosis (ICD10-I70.0) and Emphysema (ICD10-J43.9). Electronically Signed   By: Inge Rise M.D.   On: 01/15/2020 11:25   DG Chest Port 1 View  Result Date: 01/13/2020 CLINICAL DATA:  87 year old male with shortness of breath. Possible exposure to COVID-19. EXAM: PORTABLE CHEST 1 VIEW COMPARISON:  Chest radiographs 01/06/2020 and earlier. FINDINGS: Portable AP semi upright view at 0545 hours. Sequelae of TAVR. Stable cardiac size and mediastinal contours. Heart size at the upper limits of normal. Visualized tracheal air column is  within normal limits. Mildly lower lung volumes. Allowing for portable technique the lungs are clear. No pneumothorax or pleural effusion. Visible bowel gas increased in the upper abdomen but remains within normal limits. No acute osseous abnormality identified. IMPRESSION: No acute cardiopulmonary abnormality. Electronically Signed   By: Genevie Ann M.D.   On: 01/13/2020 06:00   ECHOCARDIOGRAM COMPLETE  Result Date: 01/14/2020    ECHOCARDIOGRAM REPORT   Patient Name:   [name] Date of Exam: 01/14/2020 Medical Rec #:  [id number]         Height:       70.0 in Accession #:    [id number]        Weight:       188.0 lb Date of Birth:  1931/06/19         BSA:          2.033 m Patient Age:    88 years          BP:            165/93 mmHg Patient Gender: M                 HR:           101 bpm. Exam Location:  [facility] Procedure: 2D Echo Indications:    Abnormal ECG R94.31  History:        Patient has prior history of Echocardiogram examinations, most                 recent 03/19/2019. CAD; Risk Factors:Dyslipidemia, Hypertension                 and Former Smoker. Parkinson's, TAVR March 2020, Elevated                 Troponin.  Sonographer:    Leavy Cella RDCS (AE) Referring Phys: [id number] COURAGE EMOKPAE IMPRESSIONS  1. Left ventricular ejection fraction, by estimation, is 70 to 75%. The left ventricle has hyperdynamic function. The left ventricle has no regional wall motion abnormalities. There is moderate left ventricular hypertrophy. Indeterminate diastolic filling due to E-A fusion.  2. Right ventricular systolic function is normal. The right ventricular size is normal.  3. Left atrial size was mildly dilated.  4. Trivial mitral valve regurgitation.  5. S/P TAVR with 29 mm Edwards Sapien prosthesis. Opens well Peak and mean gradients through the valve are 16 and 9 mm Hg respectively. Trivial valvular AI. No signifiant change from echo in March 2021 .  6. The inferior vena cava is dilated in size with >50% respiratory variability, suggesting right atrial pressure of 8 mmHg. FINDINGS  Left Ventricle: Left ventricular ejection fraction, by estimation, is 70 to 75%. The left ventricle has hyperdynamic function. The left ventricle has no regional wall motion abnormalities. The left ventricular internal cavity size was normal in size. There is moderate left ventricular hypertrophy. Indeterminate diastolic filling due to E-A fusion. Right Ventricle: The right ventricular size is normal. Right vetricular wall thickness was not assessed. Right ventricular systolic function is normal. Left Atrium: Left atrial size was mildly dilated. Right Atrium: Right atrial size was normal in size. Pericardium: There is no evidence of pericardial  effusion. Mitral Valve: The mitral valve is normal in structure. Trivial mitral valve regurgitation. Tricuspid Valve: The tricuspid valve is normal in structure. Tricuspid valve regurgitation is trivial. Aortic Valve: S/P TAVR with 29 mm Edwards Sapien prosthesis. Opens well Peak and mean gradients through the valve are 16 and 9  mm Hg respectively. Trivial valvular AI. No signifiant change from echo in March 2021. The aortic valve has been repaired/replaced. Aortic valve regurgitation is trivial. Aortic regurgitation PHT measures 387 msec. Aortic valve mean gradient measures 9.3 mmHg. Aortic valve peak gradient measures 15.8 mmHg. Pulmonic Valve: The pulmonic valve was grossly normal. Pulmonic valve regurgitation is not visualized. Aorta: The aortic root is normal in size and structure. Venous: The inferior vena cava is dilated in size with greater than 50% respiratory variability, suggesting right atrial pressure of 8 mmHg. IAS/Shunts: The interatrial septum was not assessed.  LEFT VENTRICLE PLAX 2D LVIDd:         4.62 cm Diastology LVIDs:         2.60 cm LV e' medial:    11.20 cm/s LV PW:         1.90 cm LV E/e' medial:  7.0 LV IVS:        1.66 cm LV e' lateral:   7.62 cm/s                        LV E/e' lateral: 10.3  RIGHT VENTRICLE RV S prime:     26.40 cm/s TAPSE (M-mode): 2.4 cm LEFT ATRIUM              Index       RIGHT ATRIUM           Index LA diam:        5.30 cm  2.61 cm/m  RA Area:     19.20 cm LA Vol (A2C):   121.0 ml 59.51 ml/m RA Volume:   59.70 ml  29.36 ml/m LA Vol (A4C):   67.9 ml  33.40 ml/m LA Biplane Vol: 92.3 ml  45.40 ml/m  AORTIC VALVE AV Vmax:           198.67 cm/s AV Vmean:          146.000 cm/s AV VTI:            0.361 m AV Peak Grad:      15.8 mmHg AV Mean Grad:      9.3 mmHg LVOT Vmax:         92.03 cm/s LVOT Vmean:        66.300 cm/s LVOT VTI:          0.177 m LVOT/AV VTI ratio: 0.49 AI PHT:            387 msec  AORTA Ao Root diam: 3.90 cm MITRAL VALVE MV Area (PHT): 2.92 cm      SHUNTS MV Decel Time: 260 msec     Systemic VTI: 0.18 m MV E velocity: 78.30 cm/s MV A velocity: 113.00 cm/s MV E/A ratio:  0.69 Dietrich Pates MD Electronically signed by Dietrich Pates MD Signature Date/Time: 01/14/2020/4:37:29 PM    Final        Subjective: Weak, tired.  Hematuria persists.  No vomiting, no diarrhea.  His confusion seems less.  No further fever.  Severely out of breath and debilitated.      Discharge Exam: Vitals:   01/18/20 0511 01/18/20 0829  BP:  (!) 137/107  Pulse: 97 97  Resp:    Temp:    SpO2: 95% 92%   Vitals:   01/17/20 1937 01/18/20 0509 01/18/20 0511 01/18/20 0829  BP: (!) 158/71 (!) 119/109  (!) 137/107  Pulse: 92 100 97 97  Resp: 18 (!) 21    Temp: 97.9 F (36.6  C) 98.7 F (37.1 C)    TempSrc:      SpO2: 96% 92% 95% 92%  Weight:      Height:        General: Pt is alert, awake, apears in some respiratory distress Cardiovascular: Tachy, irregular, nl S1-S2, no murmurs appreciated.   Some developeing nonpitting LE edema.   Respiratory: Tachypneic, labored.  DIminished throuhgout. Abdominal: Abdomen soft and non-tender.  No distension or HSM.   Neuro/Psych: Strength symmetric in upper and lower extremities.  Judgment and insight appear normal.  Bradykinetic, voice hypophonic and halting.   The results of significant diagnostics from this hospitalization (including imaging, microbiology, ancillary and laboratory) are listed below for reference.     Microbiology: Recent Results (from the past 240 hour(s))  Resp Panel by RT-PCR (Flu A&B, Covid) Nasopharyngeal Swab     Status: None   Collection Time: 01/13/20  4:20 AM   Specimen: Nasopharyngeal Swab; Nasopharyngeal(NP) swabs in vial transport medium  Result Value Ref Range Status   SARS Coronavirus 2 by RT PCR NEGATIVE NEGATIVE Final    Comment: (NOTE) SARS-CoV-2 target nucleic acids are NOT DETECTED.  The SARS-CoV-2 RNA is generally detectable in upper respiratory specimens during the acute  phase of infection. The lowest concentration of SARS-CoV-2 viral copies this assay can detect is 138 copies/mL. A negative result does not preclude SARS-Cov-2 infection and should not be used as the sole basis for treatment or other patient management decisions. A negative result may occur with  improper specimen collection/handling, submission of specimen other than nasopharyngeal swab, presence of viral mutation(s) within the areas targeted by this assay, and inadequate number of viral copies(<138 copies/mL). A negative result must be combined with clinical observations, patient history, and epidemiological information. The expected result is Negative.  Fact Sheet for Patients:  BloggerCourse.comhttps://www.fda.gov/media/152166/download  Fact Sheet for Healthcare Providers:  SeriousBroker.ithttps://www.fda.gov/media/152162/download  This test is no t yet approved or cleared by the [place] FDA and  has been authorized for detection and/or diagnosis of SARS-CoV-2 by FDA under an Emergency Use Authorization (EUA). This EUA will remain  in effect (meaning this test can be used) for the duration of the COVID-19 declaration under Section 564(b)(1) of the Act, 21 U.S.C.section 360bbb-3(b)(1), unless the authorization is terminated  or revoked sooner.       Influenza A by PCR NEGATIVE NEGATIVE Final   Influenza B by PCR NEGATIVE NEGATIVE Final    Comment: (NOTE) The Xpert Xpress SARS-CoV-2/FLU/RSV plus assay is intended as an aid in the diagnosis of influenza from Nasopharyngeal swab specimens and should not be used as a sole basis for treatment. Nasal washings and aspirates are unacceptable for Xpert Xpress SARS-CoV-2/FLU/RSV testing.  Fact Sheet for Patients: BloggerCourse.comhttps://www.fda.gov/media/152166/download  Fact Sheet for Healthcare Providers: SeriousBroker.ithttps://www.fda.gov/media/152162/download  This test is not yet approved or cleared by the [place] FDA and has been authorized for detection and/or diagnosis of  SARS-CoV-2 by FDA under an Emergency Use Authorization (EUA). This EUA will remain in effect (meaning this test can be used) for the duration of the COVID-19 declaration under Section 564(b)(1) of the Act, 21 U.S.C. section 360bbb-3(b)(1), unless the authorization is terminated or revoked.  Performed at [facility], [address] [place], [place] [id number]   Blood Culture (routine x 2)     Status: None   Collection Time: 01/13/20  6:07 AM   Specimen: BLOOD RIGHT FOREARM  Result Value Ref Range Status   Specimen Description BLOOD RIGHT FOREARM  Final  Special Requests   Final    BOTTLES DRAWN AEROBIC AND ANAEROBIC Blood Culture adequate volume   Culture   Final    NO GROWTH 5 DAYS Performed at [facility], [address] [place], [place] [address]    Report Status 01/18/2020 FINAL  Final  Blood Culture (routine x 2)     Status: None   Collection Time: 01/13/20  6:14 AM   Specimen: BLOOD  Result Value Ref Range Status   Specimen Description BLOOD LEFT ANTECUBITAL  Final   Special Requests   Final    BOTTLES DRAWN AEROBIC AND ANAEROBIC Blood Culture adequate volume   Culture   Final    NO GROWTH 5 DAYS Performed at [facility], [address] [place], [place] [address]    Report Status 01/18/2020 FINAL  Final  Culture, Urine     Status: None   Collection Time: 01/14/20  3:01 PM   Specimen: Urine, Catheterized  Result Value Ref Range Status   Specimen Description   Final    URINE, CATHETERIZED Performed at [facility], [address] [place], [place] [address]    Special Requests   Final    NONE Performed at [facility], [address], [place], [place] [address]    Culture   Final    NO GROWTH Performed at [facility], [place] [address], [place], [place] [address]    Report Status 01/16/2020 FINAL  Final     Labs: BNP (last 3 results) Recent Labs    01/13/20 0511  BNP [id number]*   Basic Metabolic Panel: Recent Labs  Lab 01/13/20 1423 01/14/20 0205  01/15/20 0612 01/16/20 0607 01/17/20 0720 01/18/20 0701  NA 137 139 143 143 145 142  K 3.7 3.7 3.6 3.5 3.2* 3.3*  CL 102 105 108 108 112* 113*  CO2 24 23 25 24 24 25   GLUCOSE 123* 119* 126* 137* 133* 138*  BUN 52* 32* 23 22 23  28*  CREATININE 3.30* 1.28* 0.92 0.79 0.93 1.08  CALCIUM 8.7* 8.7* 8.5* 8.3* 8.4* 8.2*  MG  --   --   --   --  1.8  --   PHOS 3.6  --   --   --   --   --    Liver Function Tests: Recent Labs  Lab 01/13/20 0510 01/13/20 1423  AST 17  --   ALT 6  --   ALKPHOS 71  --   BILITOT 1.1  --   PROT 6.7  --   ALBUMIN 3.6 3.2*   No results for input(s): LIPASE, AMYLASE in the last 168 hours. No results for input(s): AMMONIA in the last 168 hours. CBC: Recent Labs  Lab 01/13/20 0510 01/14/20 0205 01/15/20 0612 01/16/20 0607 01/17/20 0720 01/18/20 0701  WBC 17.8* 11.6* 9.5 16.2* 12.3* 13.4*  NEUTROABS 15.1*  --   --   --   --   --   HGB 15.4 13.4 11.4* 12.5* 10.6* 9.8*  HCT 44.4 39.1 34.3* 36.5* 31.3* 30.0*  MCV 101.4* 101.0* 103.3* 103.1* 103.0* 104.2*  PLT 134* 115* 115* 150 165 199   Cardiac Enzymes: No results for input(s): CKTOTAL, CKMB, CKMBINDEX, TROPONINI in the last 168 hours. BNP: Invalid input(s): POCBNP CBG: No results for input(s): GLUCAP in the last 168 hours. D-Dimer No results for input(s): DDIMER in the last 72 hours. Hgb A1c No results for input(s): HGBA1C in the last 72 hours. Lipid Profile No results for input(s): CHOL, HDL, LDLCALC, TRIG,  CHOLHDL, LDLDIRECT in the last 72 hours. Thyroid function studies No results for input(s): TSH, T4TOTAL, T3FREE, THYROIDAB in the last 72 hours.  Invalid input(s): FREET3 Anemia work up No results for input(s): VITAMINB12, FOLATE, FERRITIN, TIBC, IRON, RETICCTPCT in the last 72 hours. Urinalysis    Component Value Date/Time   COLORURINE YELLOW 01/13/2020 [place] 01/13/2020 0717   LABSPEC 1.019 01/13/2020 0717   PHURINE 5.0 01/13/2020 0717   GLUCOSEU NEGATIVE  01/13/2020 0717   HGBUR LARGE (A) 01/13/2020 0717   BILIRUBINUR NEGATIVE 01/13/2020 0717   KETONESUR 5 (A) 01/13/2020 0717   PROTEINUR 30 (A) 01/13/2020 0717   NITRITE NEGATIVE 01/13/2020 0717   LEUKOCYTESUR NEGATIVE 01/13/2020 0717   Sepsis Labs Invalid input(s): PROCALCITONIN,  WBC,  LACTICIDVEN Microbiology Recent Results (from the past 240 hour(s))  Resp Panel by RT-PCR (Flu A&B, Covid) Nasopharyngeal Swab     Status: None   Collection Time: 01/13/20  4:20 AM   Specimen: Nasopharyngeal Swab; Nasopharyngeal(NP) swabs in vial transport medium  Result Value Ref Range Status   SARS Coronavirus 2 by RT PCR NEGATIVE NEGATIVE Final    Comment: (NOTE) SARS-CoV-2 target nucleic acids are NOT DETECTED.  The SARS-CoV-2 RNA is generally detectable in upper respiratory specimens during the acute phase of infection. The lowest concentration of SARS-CoV-2 viral copies this assay can detect is 138 copies/mL. A negative result does not preclude SARS-Cov-2 infection and should not be used as the sole basis for treatment or other patient management decisions. A negative result may occur with  improper specimen collection/handling, submission of specimen other than nasopharyngeal swab, presence of viral mutation(s) within the areas targeted by this assay, and inadequate number of viral copies(<138 copies/mL). A negative result must be combined with clinical observations, patient history, and epidemiological information. The expected result is Negative.  Fact Sheet for Patients:  EntrepreneurPulse.com.au  Fact Sheet for Healthcare Providers:  IncredibleEmployment.be  This test is no t yet approved or cleared by the [place] FDA and  has been authorized for detection and/or diagnosis of SARS-CoV-2 by FDA under an Emergency Use Authorization (EUA). This EUA will remain  in effect (meaning this test can be used) for the duration of the COVID-19  declaration under Section 564(b)(1) of the Act, 21 U.S.C.section 360bbb-3(b)(1), unless the authorization is terminated  or revoked sooner.       Influenza A by PCR NEGATIVE NEGATIVE Final   Influenza B by PCR NEGATIVE NEGATIVE Final    Comment: (NOTE) The Xpert Xpress SARS-CoV-2/FLU/RSV plus assay is intended as an aid in the diagnosis of influenza from Nasopharyngeal swab specimens and should not be used as a sole basis for treatment. Nasal washings and aspirates are unacceptable for Xpert Xpress SARS-CoV-2/FLU/RSV testing.  Fact Sheet for Patients: EntrepreneurPulse.com.au  Fact Sheet for Healthcare Providers: IncredibleEmployment.be  This test is not yet approved or cleared by the [place] FDA and has been authorized for detection and/or diagnosis of SARS-CoV-2 by FDA under an Emergency Use Authorization (EUA). This EUA will remain in effect (meaning this test can be used) for the duration of the COVID-19 declaration under Section 564(b)(1) of the Act, 21 U.S.C. section 360bbb-3(b)(1), unless the authorization is terminated or revoked.  Performed at [facility], [address] [place], [place] [address]   Blood Culture (routine x 2)     Status: None   Collection Time: 01/13/20  6:07 AM   Specimen: BLOOD RIGHT FOREARM  Result Value Ref Range Status   Specimen  Description BLOOD RIGHT FOREARM  Final   Special Requests   Final    BOTTLES DRAWN AEROBIC AND ANAEROBIC Blood Culture adequate volume   Culture   Final    NO GROWTH 5 DAYS Performed at [facility], [address], [place], [place] [address]    Report Status 01/18/2020 FINAL  Final  Blood Culture (routine x 2)     Status: None   Collection Time: 01/13/20  6:14 AM   Specimen: BLOOD  Result Value Ref Range Status   Specimen Description BLOOD LEFT ANTECUBITAL  Final   Special Requests   Final    BOTTLES DRAWN AEROBIC AND ANAEROBIC Blood Culture adequate volume   Culture    Final    NO GROWTH 5 DAYS Performed at [facility], [address], [place], [place] [address]    Report Status 01/18/2020 FINAL  Final  Culture, Urine     Status: None   Collection Time: 01/14/20  3:01 PM   Specimen: Urine, Catheterized  Result Value Ref Range Status   Specimen Description   Final    URINE, CATHETERIZED Performed at [facility], [address], [place], [place] [address]    Special Requests   Final    NONE Performed at [facility], [address] [place], [place] [address]    Culture   Final    NO GROWTH Performed at [facility], [place] [address] [place], [place] [address]    Report Status 01/16/2020 FINAL  Final     Time coordinating discharge: 45 minutes The  controlled substances registry was not reviewed for this patient enrolling in [facility].        SIGNED:   Edwin Dada, MD  [facility] 01/18/2020, 6:01 PM

## 2020-01-19 NOTE — Progress Notes (Signed)
[facility] arrived to transport patient to family home for home hospice. Pt's son left minutes prior to transport. Indwelling catheter remains.

## 2020-01-19 NOTE — Telephone Encounter (Signed)
Pramipexole was discontinued by hospital upon discharge.

## 2020-02-16 ENCOUNTER — Ambulatory Visit: Payer: Medicare Other | Admitting: Physician Assistant

## 2020-02-17 DEATH — deceased

## 2021-01-03 IMAGING — DX DG SHOULDER 2+V*L*
3 series · 3 of 3 positions shown · non-contrast
Comparison: None.

CLINICAL DATA: Fell.  Left shoulder pain.

EXAM:
LEFT SHOULDER - 2+ VIEW

[shoulder grashey]
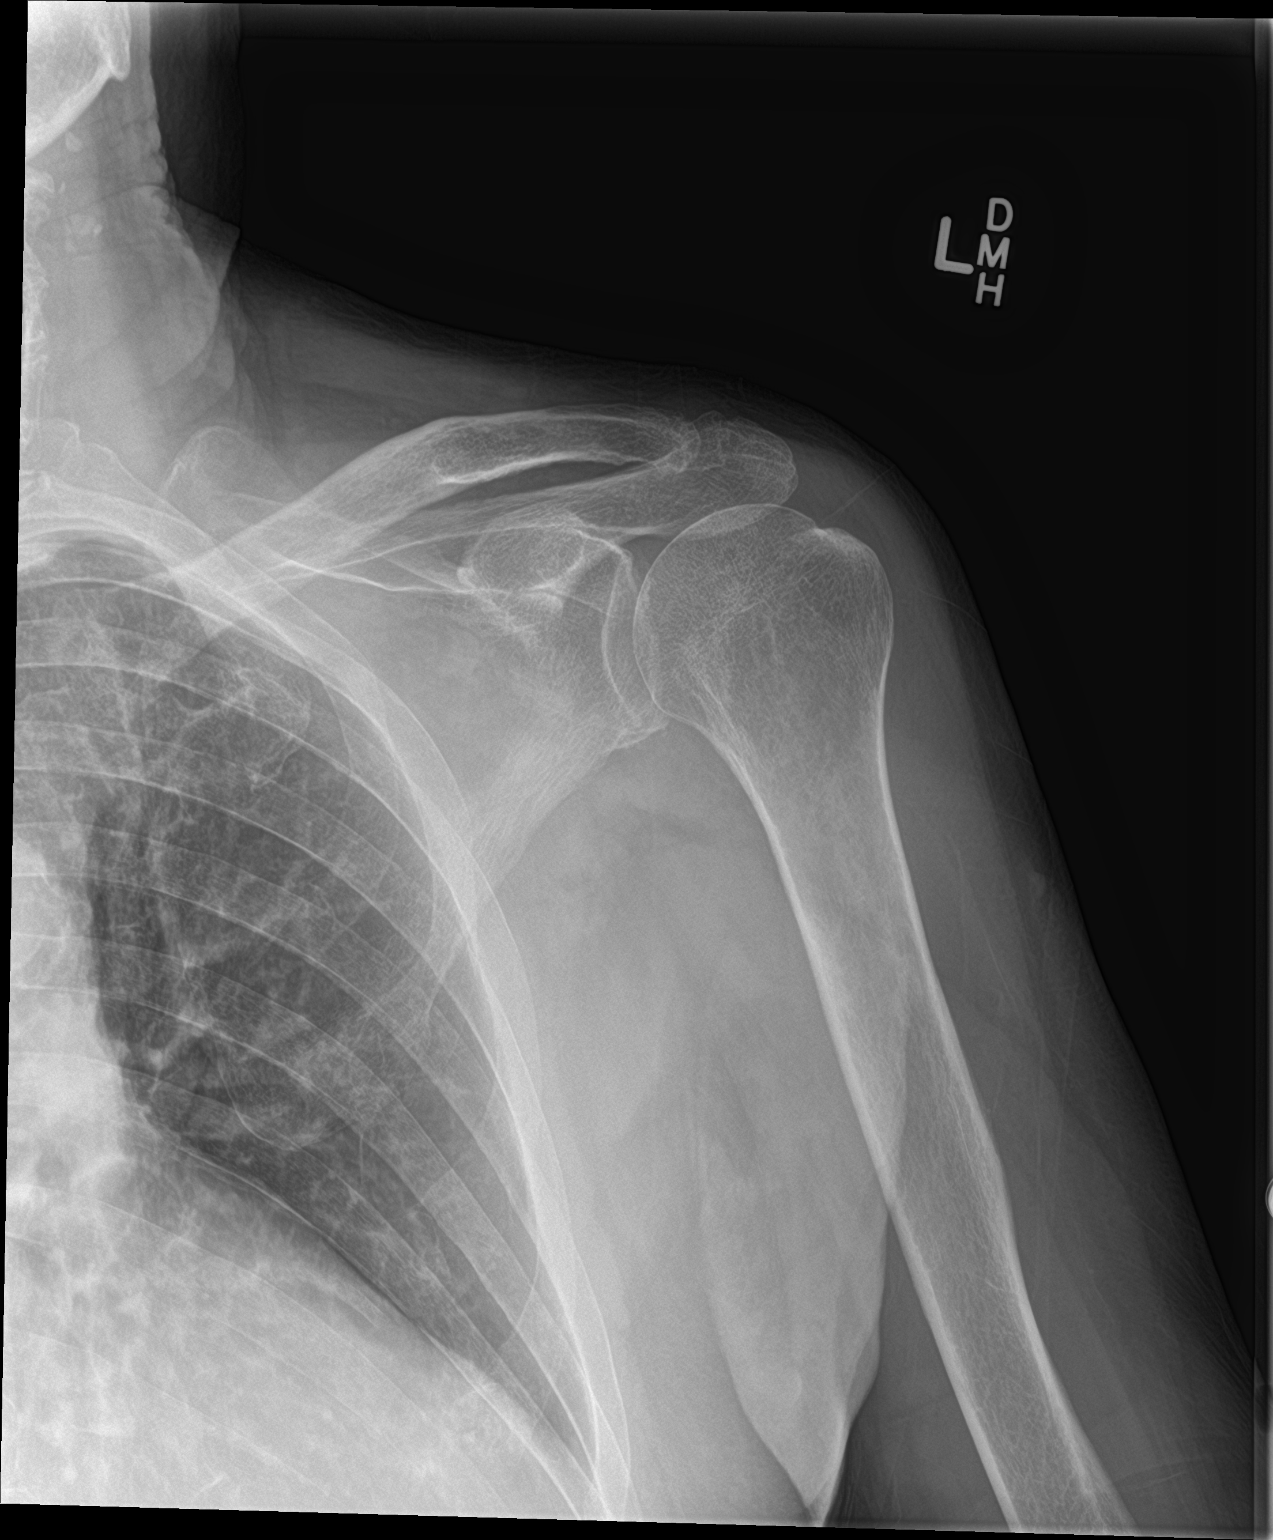

[shoulder y view]
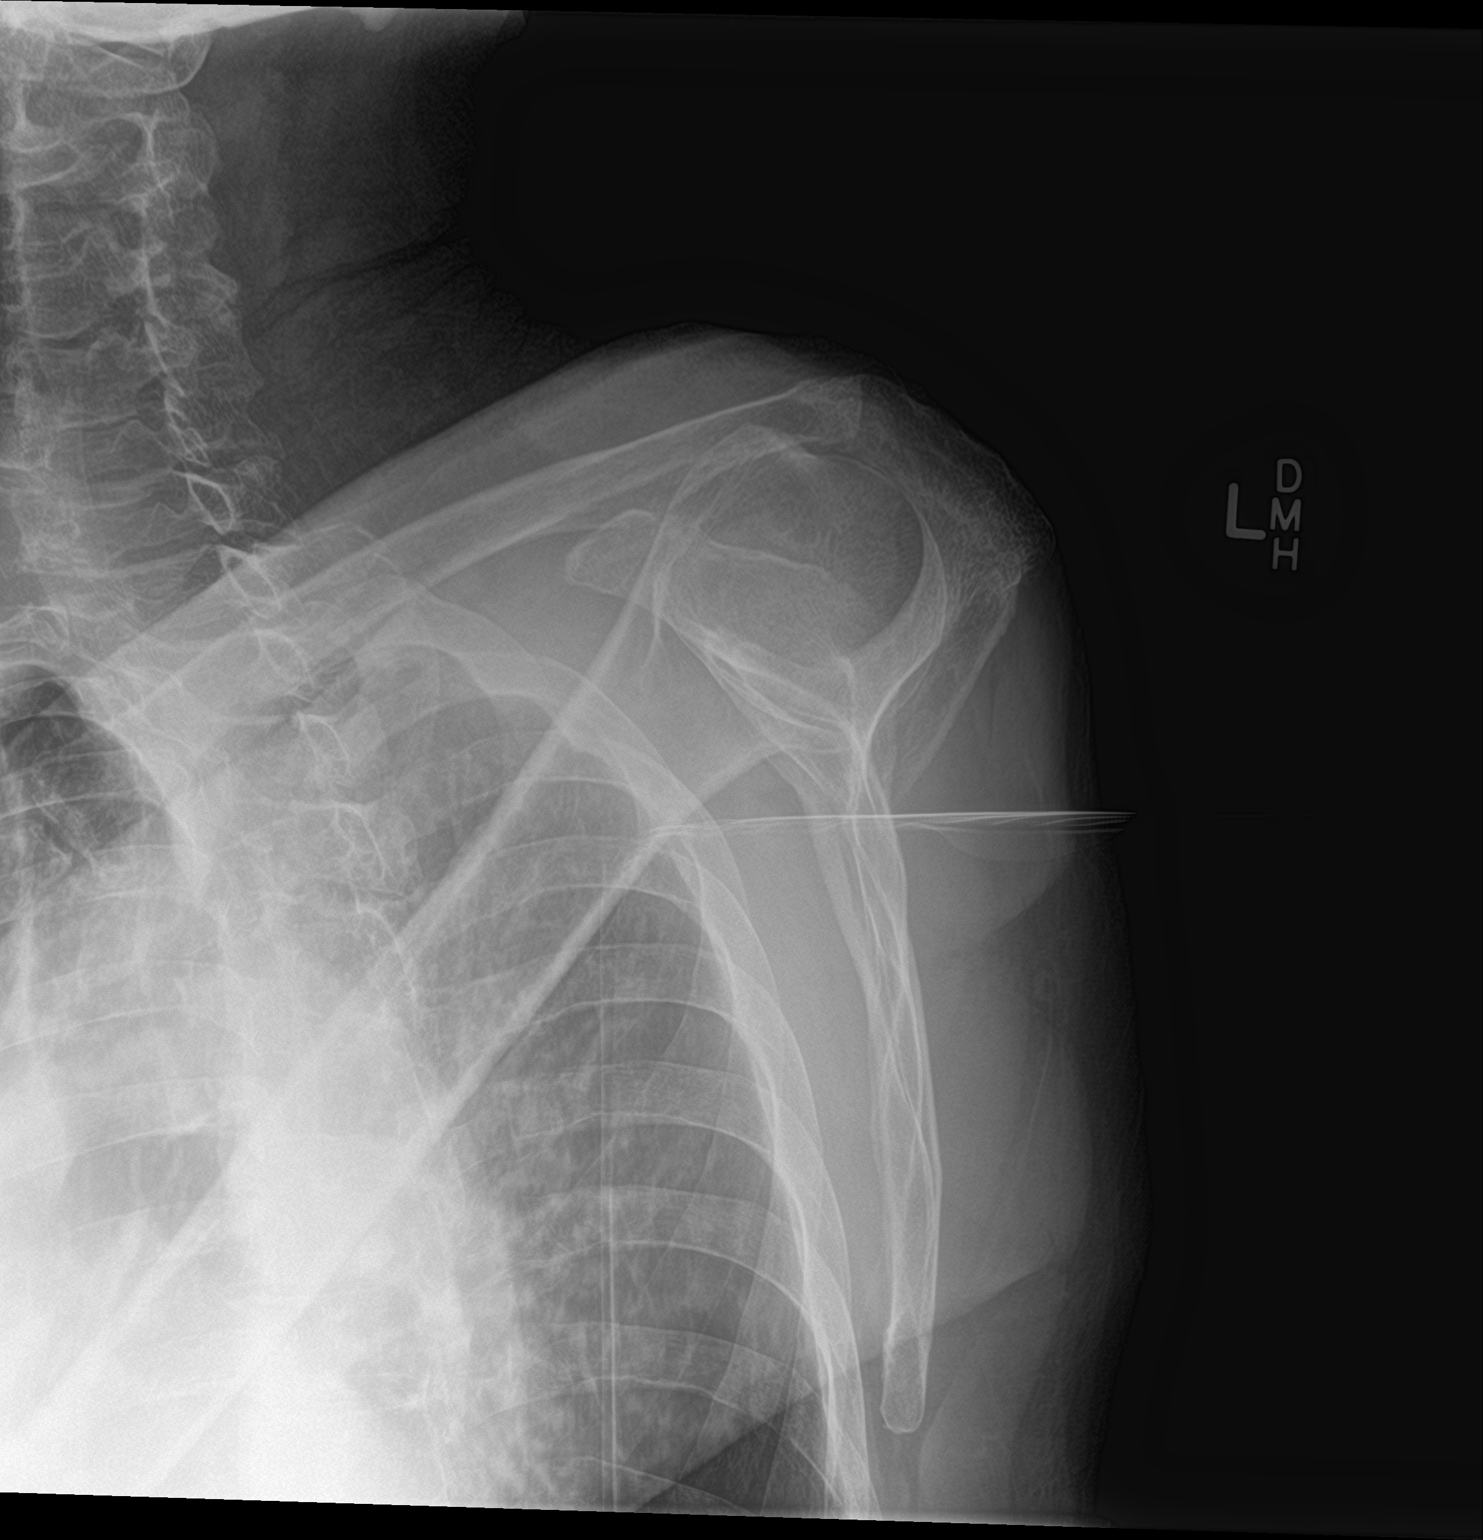

[shoulder axillary]
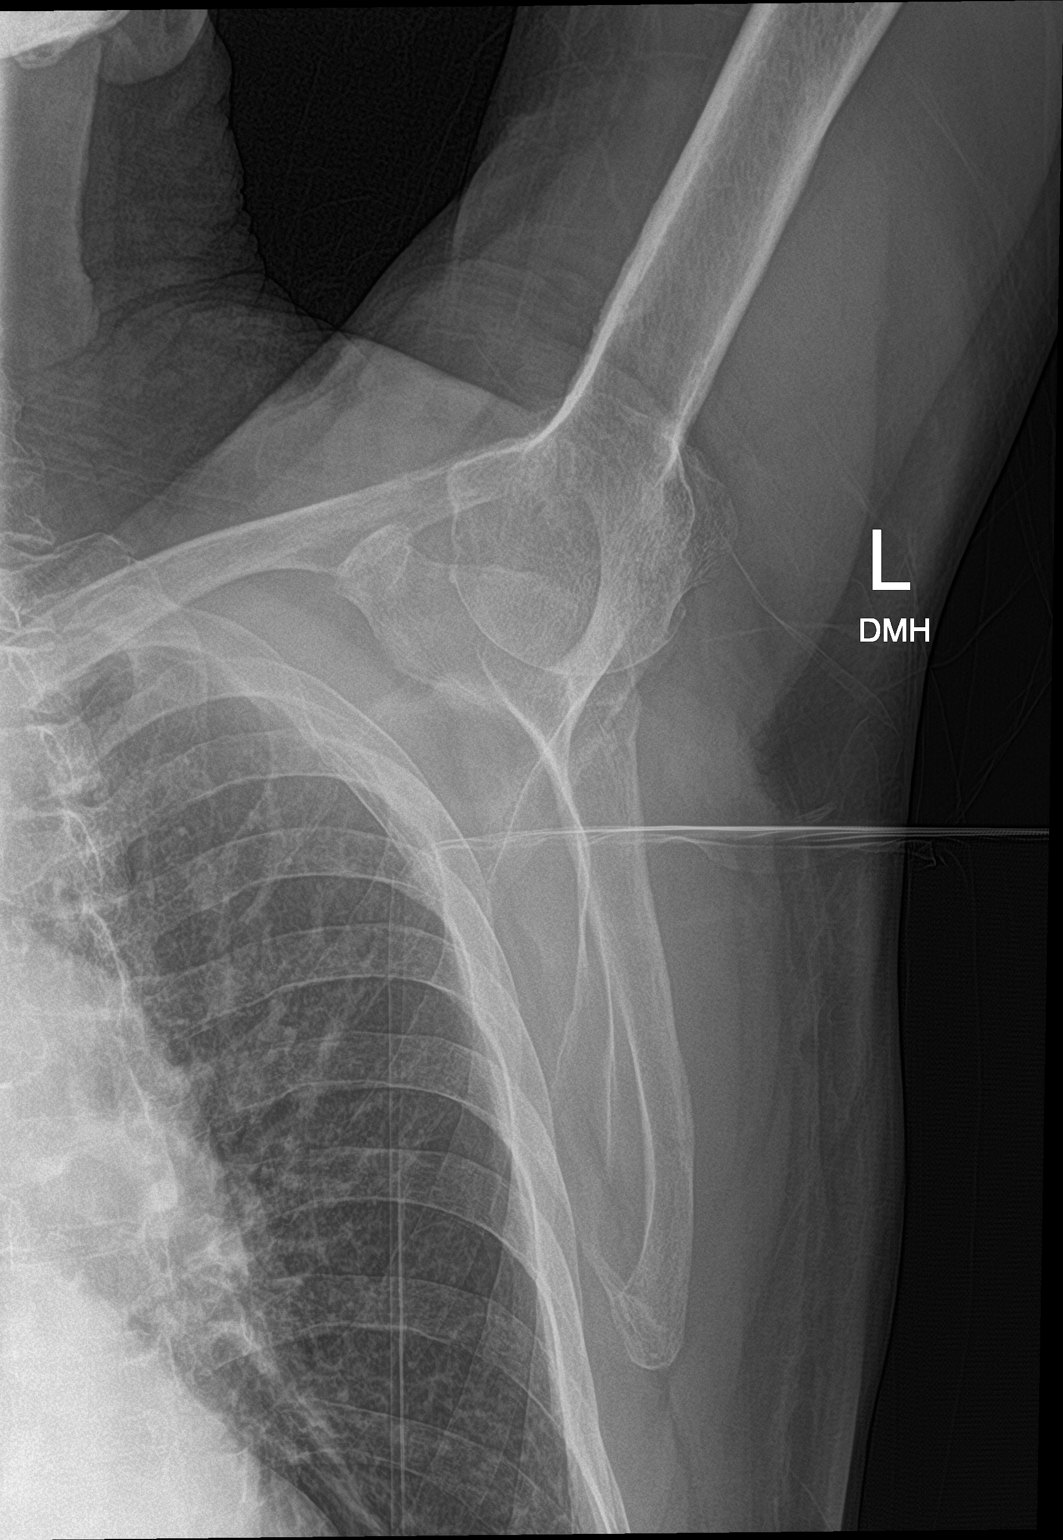

[3 of 3 positions shown; findings below may reference images not displayed]

FINDINGS: The joint spaces are maintained. No acute bony findings or bone
lesion. No abnormal soft tissue calcifications. The visualized lung
is clear and the visualized ribs are intact.
IMPRESSION: No fracture or dislocation.

## 2021-01-03 IMAGING — DX DG HIP (WITH OR WITHOUT PELVIS) 2-3V*L*
3 series · 3 of 3 positions shown · non-contrast
Comparison: None.

CLINICAL DATA: Fell.  Left hip pain.

EXAM:
DG HIP (WITH OR WITHOUT PELVIS) 2-3V LEFT

[pelvis ap]
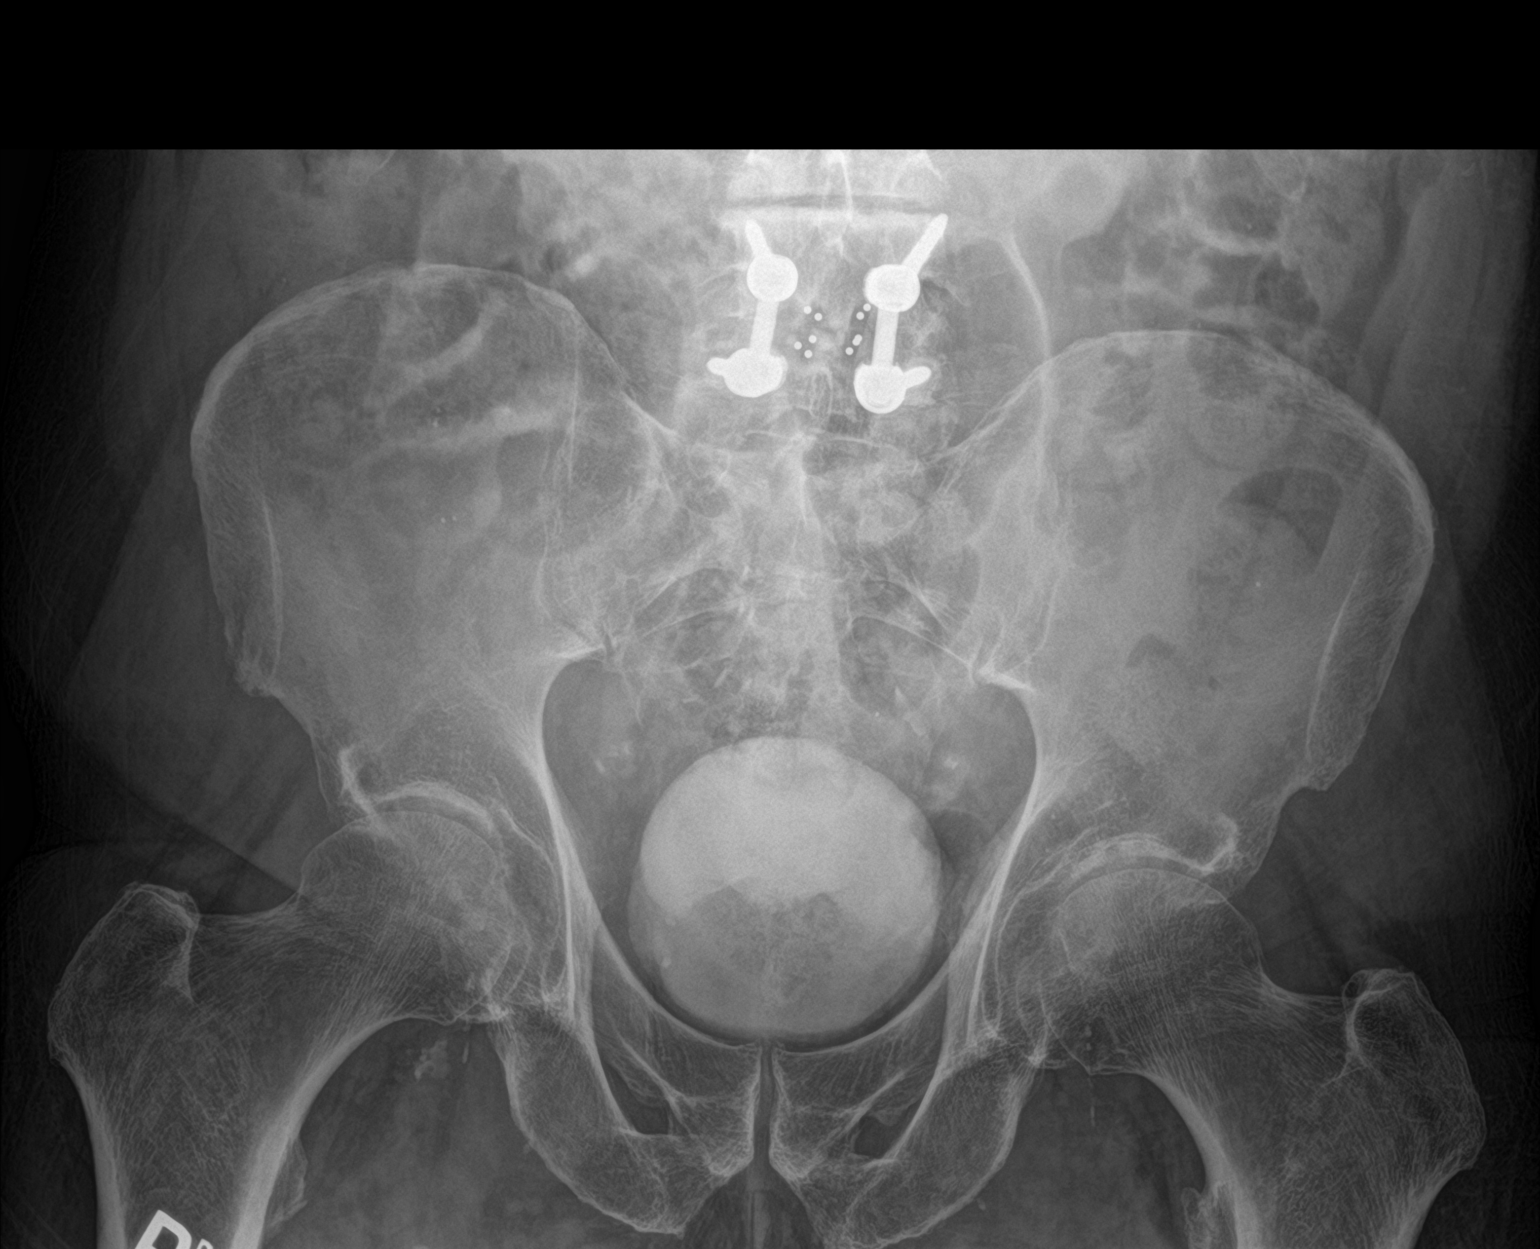

[hip ap]
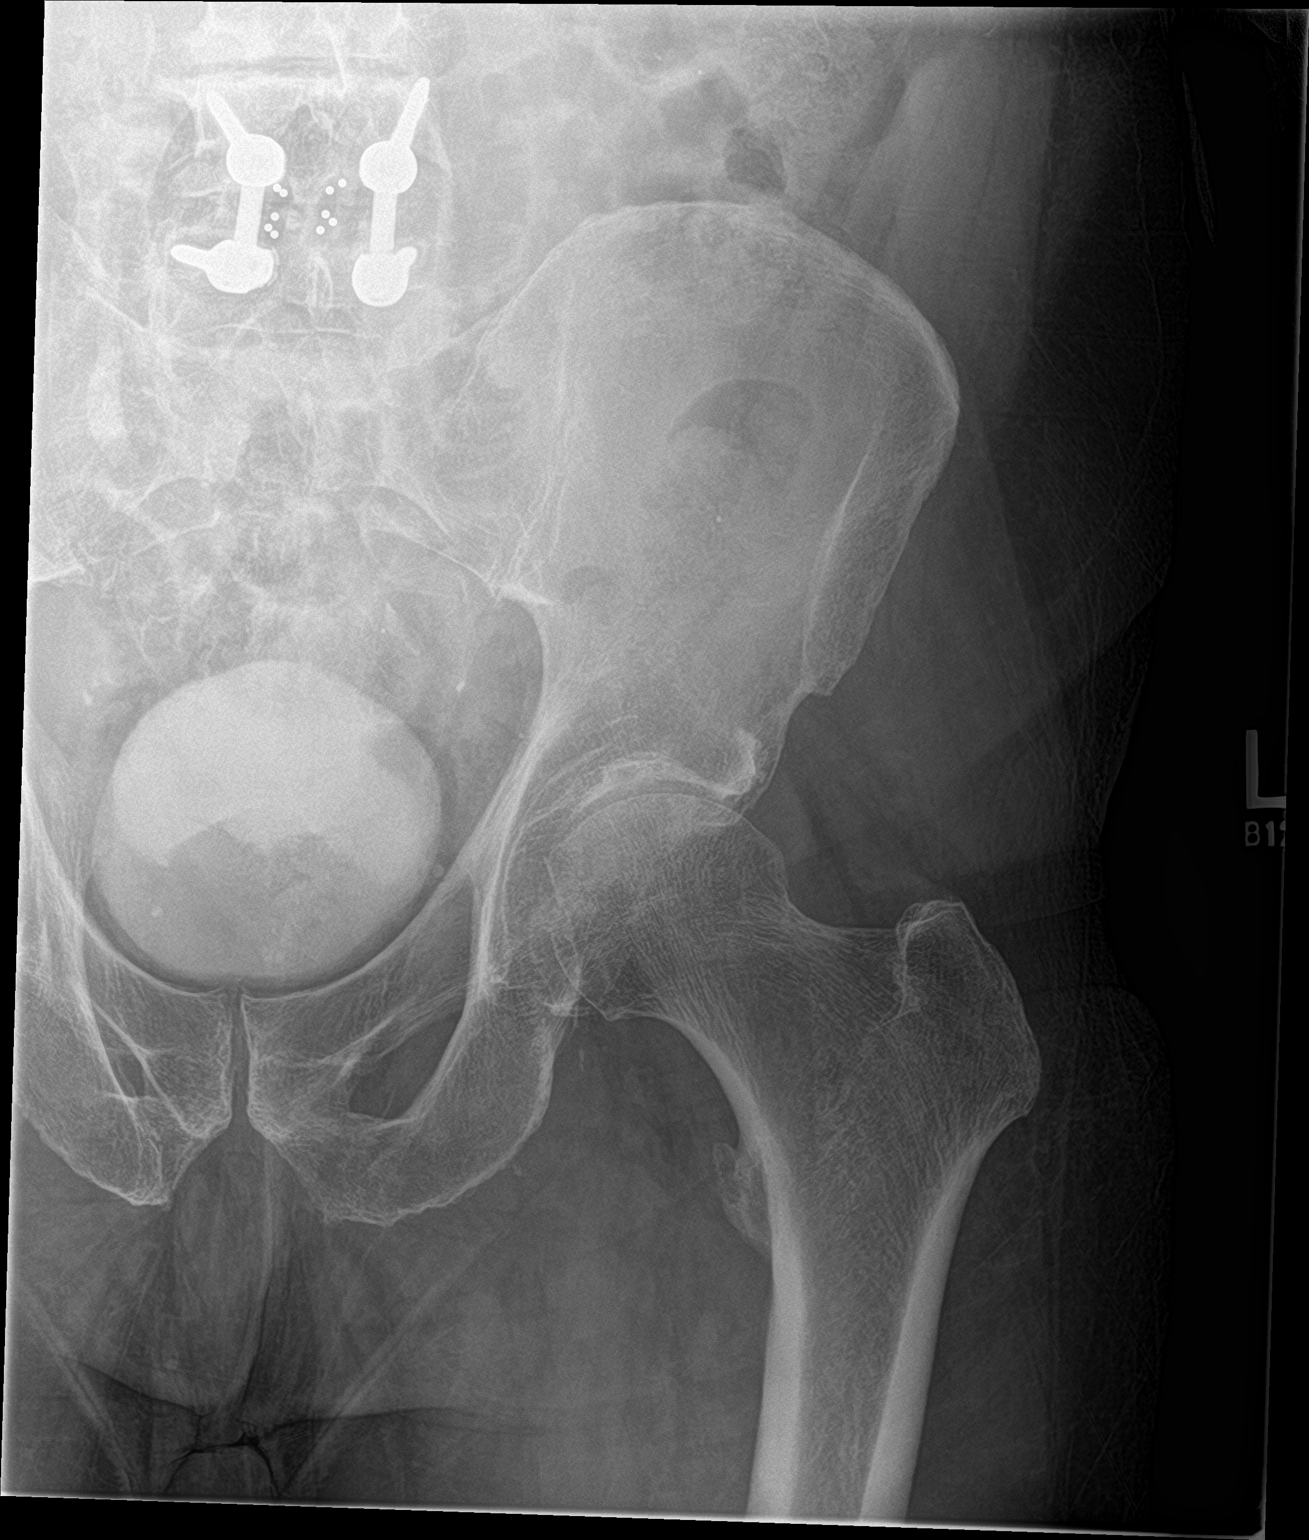

[hip lat]
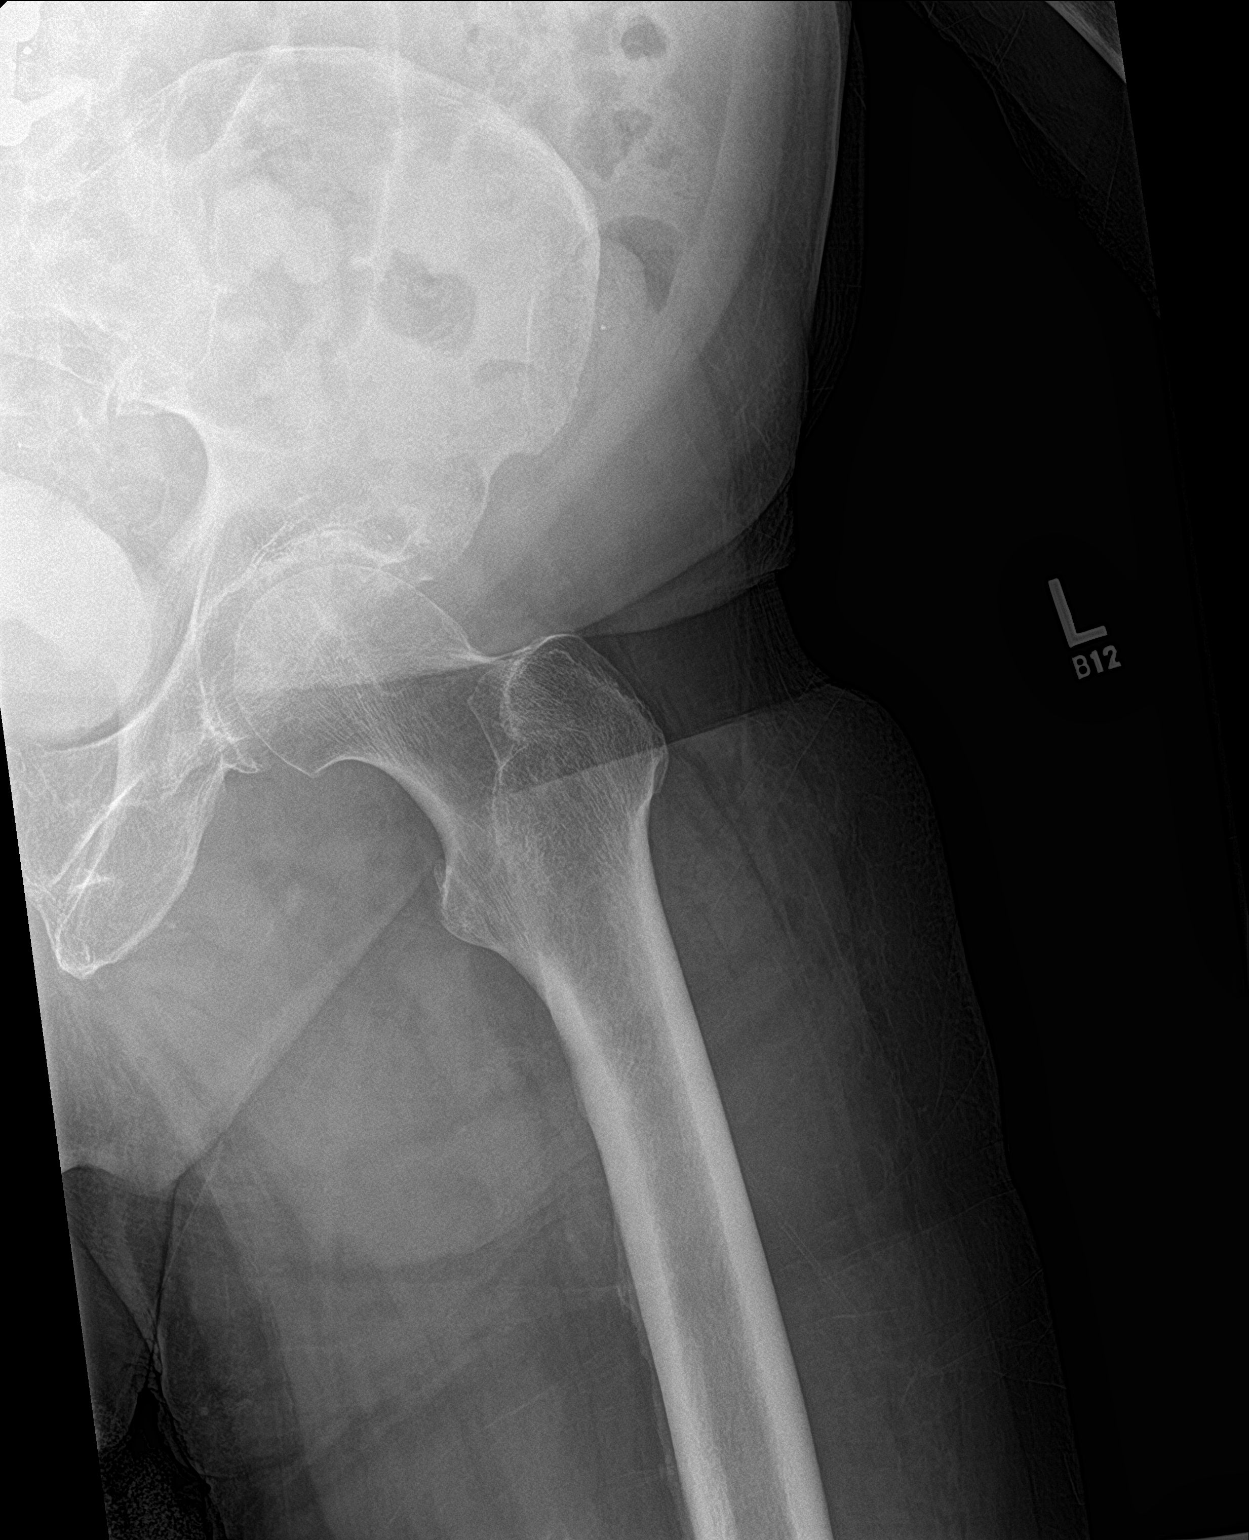

[3 of 3 positions shown; findings below may reference images not displayed]

FINDINGS: Both hips are normally located. Mild degenerative changes but no
acute fracture or AVN. The pubic symphysis and SI joints are intact.
Lower lumbar fusion hardware noted. Contrast noted in the bladder
from a prior CT scan.
IMPRESSION: No acute bony findings.

## 2021-01-22 IMAGING — DX DG CHEST 1V PORT
1 series · 1 of 1 positions shown · non-contrast
Comparison: Chest radiograph March 18, 2018

CLINICAL DATA: Status post TAVR.

EXAM:
PORTABLE CHEST 1 VIEW

[chest]
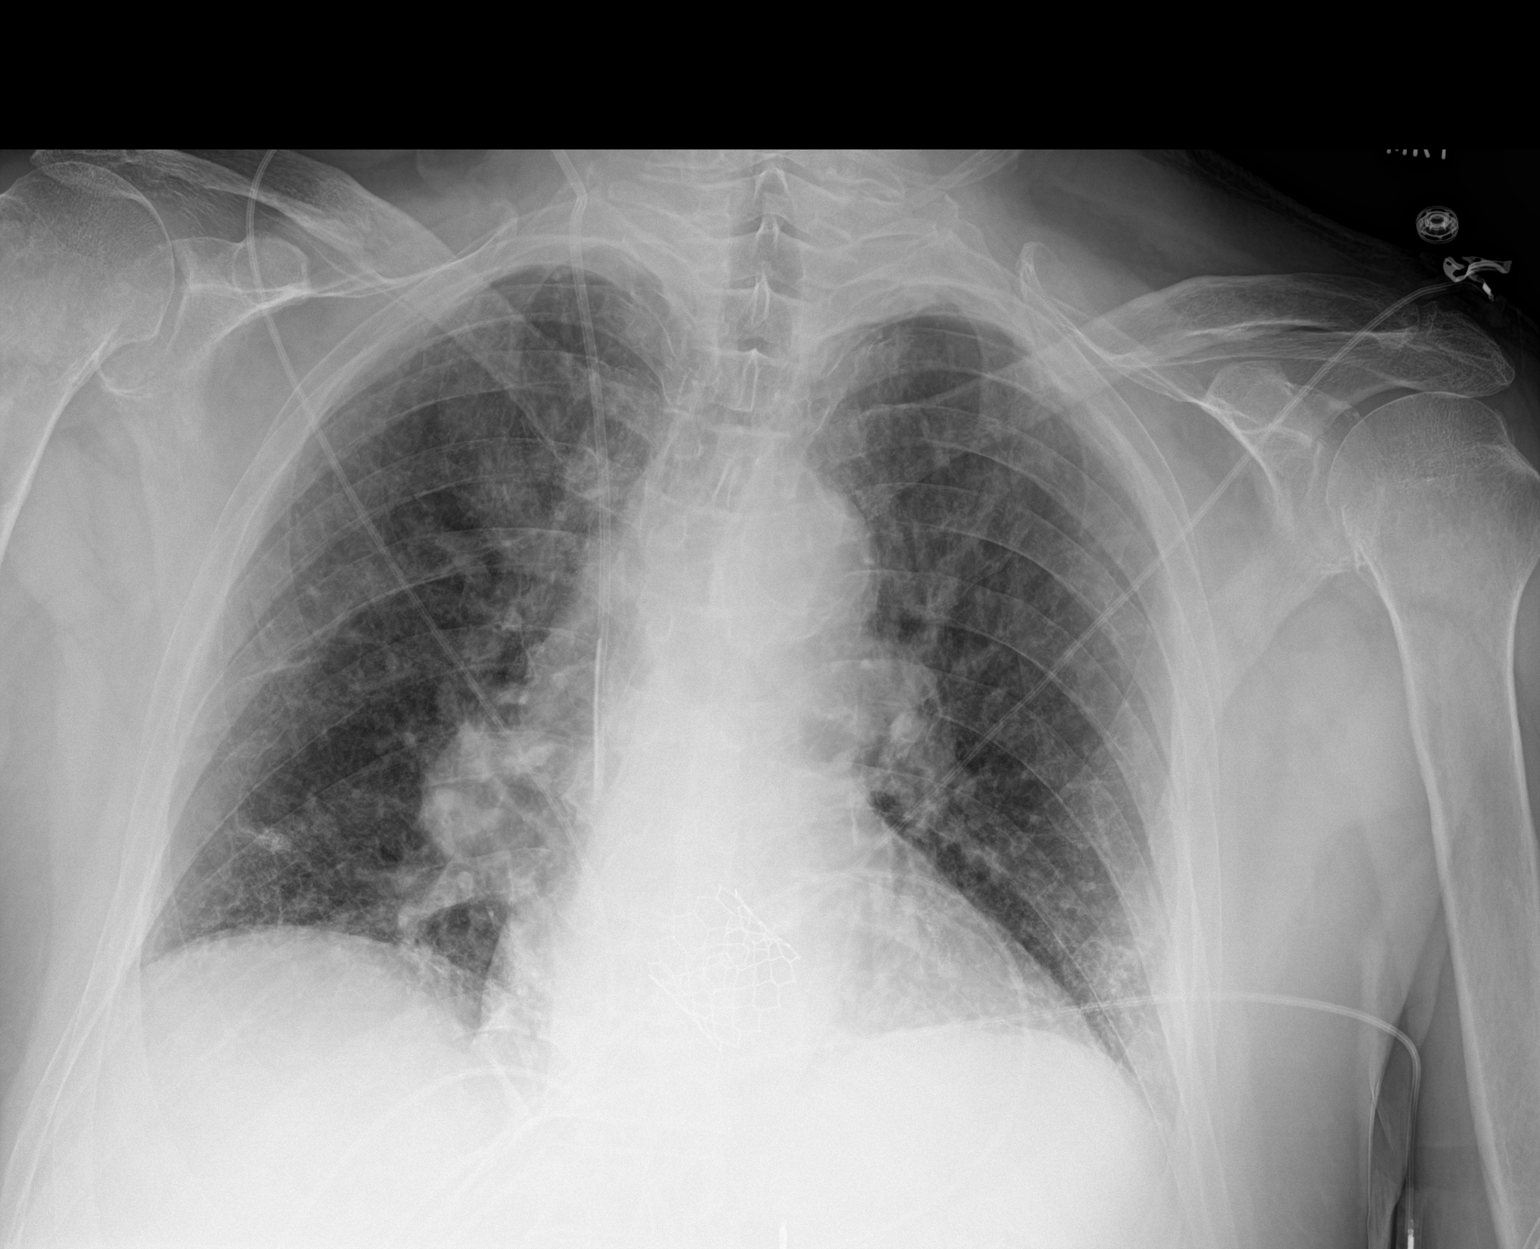

[1 of 1 positions shown; findings below may reference images not displayed]

FINDINGS: Cardiac silhouette is upper limits of normal size, unchanged.
Coronary artery stent. New aortic stent. Mildly calcified aortic
arch. Pulmonary vascular congestion without pleural effusion or
focal consolidation. No pneumothorax. RIGHT internal jugular central
venous catheter distal tip projects in distal superior vena cava.
Osseous structures are unchanged.
IMPRESSION: 1. Interval TAVR.
2. Borderline cardiomegaly and pulmonary vascular congestion.
3. RIGHT internal jugular central venous catheter distal tip
projecting in distal superior vena cava. No pneumothorax.
4.  Aortic Atherosclerosis ([id number]).
